# Patient Record
Sex: Female | Born: 1955
Health system: Southern US, Community
[De-identification: ages and names within clinical notes are randomized; demographics above are authoritative.]

## PROBLEM LIST (undated history)

## (undated) DIAGNOSIS — I10 Essential (primary) hypertension: Secondary | ICD-10-CM

## (undated) DIAGNOSIS — E119 Type 2 diabetes mellitus without complications: Secondary | ICD-10-CM

## (undated) DIAGNOSIS — E785 Hyperlipidemia, unspecified: Secondary | ICD-10-CM

## (undated) DIAGNOSIS — F419 Anxiety disorder, unspecified: Secondary | ICD-10-CM

## (undated) DIAGNOSIS — J449 Chronic obstructive pulmonary disease, unspecified: Secondary | ICD-10-CM

## (undated) DIAGNOSIS — F32A Depression, unspecified: Secondary | ICD-10-CM

## (undated) DIAGNOSIS — F329 Major depressive disorder, single episode, unspecified: Secondary | ICD-10-CM

## (undated) DIAGNOSIS — J45909 Unspecified asthma, uncomplicated: Secondary | ICD-10-CM

## (undated) HISTORY — DX: Unspecified asthma, uncomplicated: J45.909

## (undated) HISTORY — DX: Major depressive disorder, single episode, unspecified: F32.9

## (undated) HISTORY — DX: Type 2 diabetes mellitus without complications: E11.9

## (undated) HISTORY — DX: Hyperlipidemia, unspecified: E78.5

## (undated) HISTORY — DX: Anxiety disorder, unspecified: F41.9

## (undated) HISTORY — PX: INNER EAR SURGERY: SHX679

## (undated) HISTORY — DX: Depression, unspecified: F32.A

## (undated) HISTORY — PX: DILATION AND CURETTAGE OF UTERUS: SHX78

---

## 2000-08-05 ENCOUNTER — Emergency Department (HOSPITAL_COMMUNITY): Admission: EM | Admit: 2000-08-05 | Discharge: 2000-08-05 | Payer: Self-pay | Admitting: Emergency Medicine

## 2000-08-06 ENCOUNTER — Emergency Department (HOSPITAL_COMMUNITY): Admission: EM | Admit: 2000-08-06 | Discharge: 2000-08-06 | Payer: Self-pay | Admitting: Emergency Medicine

## 2000-08-07 ENCOUNTER — Emergency Department (HOSPITAL_COMMUNITY): Admission: EM | Admit: 2000-08-07 | Discharge: 2000-08-07 | Payer: Self-pay | Admitting: Emergency Medicine

## 2000-08-08 ENCOUNTER — Emergency Department (HOSPITAL_COMMUNITY): Admission: EM | Admit: 2000-08-08 | Discharge: 2000-08-08 | Payer: Self-pay | Admitting: Emergency Medicine

## 2000-09-02 ENCOUNTER — Emergency Department (HOSPITAL_COMMUNITY): Admission: EM | Admit: 2000-09-02 | Discharge: 2000-09-02 | Payer: Self-pay | Admitting: Emergency Medicine

## 2000-09-03 ENCOUNTER — Emergency Department (HOSPITAL_COMMUNITY): Admission: EM | Admit: 2000-09-03 | Discharge: 2000-09-03 | Payer: Self-pay | Admitting: *Deleted

## 2000-12-21 ENCOUNTER — Emergency Department (HOSPITAL_COMMUNITY): Admission: EM | Admit: 2000-12-21 | Discharge: 2000-12-21 | Payer: Self-pay | Admitting: Emergency Medicine

## 2006-03-24 ENCOUNTER — Ambulatory Visit: Payer: Self-pay | Admitting: Family Medicine

## 2006-03-25 ENCOUNTER — Ambulatory Visit: Payer: Self-pay | Admitting: *Deleted

## 2006-11-30 ENCOUNTER — Encounter (INDEPENDENT_AMBULATORY_CARE_PROVIDER_SITE_OTHER): Payer: Self-pay | Admitting: *Deleted

## 2011-04-29 DIAGNOSIS — F609 Personality disorder, unspecified: Secondary | ICD-10-CM | POA: Diagnosis not present

## 2011-04-29 DIAGNOSIS — F322 Major depressive disorder, single episode, severe without psychotic features: Secondary | ICD-10-CM | POA: Diagnosis not present

## 2011-07-06 DIAGNOSIS — F3189 Other bipolar disorder: Secondary | ICD-10-CM | POA: Diagnosis not present

## 2011-12-20 DIAGNOSIS — F609 Personality disorder, unspecified: Secondary | ICD-10-CM | POA: Diagnosis not present

## 2011-12-20 DIAGNOSIS — F322 Major depressive disorder, single episode, severe without psychotic features: Secondary | ICD-10-CM | POA: Diagnosis not present

## 2012-02-29 DIAGNOSIS — F609 Personality disorder, unspecified: Secondary | ICD-10-CM | POA: Diagnosis not present

## 2012-02-29 DIAGNOSIS — F322 Major depressive disorder, single episode, severe without psychotic features: Secondary | ICD-10-CM | POA: Diagnosis not present

## 2012-03-21 DIAGNOSIS — F322 Major depressive disorder, single episode, severe without psychotic features: Secondary | ICD-10-CM | POA: Diagnosis not present

## 2012-03-30 ENCOUNTER — Ambulatory Visit (INDEPENDENT_AMBULATORY_CARE_PROVIDER_SITE_OTHER): Payer: Medicare Other | Admitting: Emergency Medicine

## 2012-03-30 VITALS — BP 220/103 | HR 98 | Temp 97.6°F | Resp 16 | Ht 64.0 in

## 2012-03-30 DIAGNOSIS — I10 Essential (primary) hypertension: Secondary | ICD-10-CM

## 2012-03-30 MED ORDER — METOPROLOL SUCCINATE ER 50 MG PO TB24: 50.0000 mg | ORAL_TABLET | Freq: Every day | ORAL | Status: DC

## 2012-03-30 MED ORDER — HYDROCHLOROTHIAZIDE 25 MG PO TABS: 25.0000 mg | ORAL_TABLET | Freq: Every day | ORAL | Status: DC

## 2012-03-30 NOTE — Patient Instructions (Signed)

## 2012-03-30 NOTE — Progress Notes (Signed)
[facility] [address] [place] [address] [phone]  Date:  03/30/2012   Name:  Gwendolyn Pham   DOB:  08/15/1955   MRN:  [id number]  PCP:  No primary provider on file.    Chief Complaint: Hypertension   History of Present Illness:  Gwendolyn Pham is a 57 y.o. very pleasant female patient who presents with the following:  History of elevated pressure over the past three months.  Has daily headaches.  No chest pain or shortness of breath.  Has blood pressure checked when she picks up her medications and has been told for months to come and be treated.  Decided today to come in and have that done.  Smokes 1 PPD and is trying to quit.  There is no problem list on file for this patient.   Past Medical History  Diagnosis Date  . Anxiety   . Asthma   . Depression     Past Surgical History  Procedure Date  . Inner ear surgery     History  Substance Use Topics  . Smoking status: Current Every Day Smoker  . Smokeless tobacco: Not on file     Comment: 1/2 pack per day  . Alcohol Use: No    Family History  Problem Relation Age of Onset  . Hypertension Mother   . Diabetes Mother   . COPD Father   . Heart disease Father     Allergies  Allergen Reactions  . Penicillins Rash    Medication list has been reviewed and updated.  Current Outpatient Prescriptions on File Prior to Visit  Medication Sig Dispense Refill  . venlafaxine (EFFEXOR) 75 MG tablet Take 75 mg by mouth 2 (two) times daily.        Review of Systems:  As per HPI, otherwise negative.    Physical Examination: Filed Vitals:   03/30/12 1427  BP: 220/103  Pulse: 98  Temp: 97.6 F (36.4 C)  Resp: 16   Filed Vitals:   03/30/12 1427  Height: 5\' 4"  (1.626 m)   There is no weight on file to calculate BMI. Ideal Body Weight: Weight in (lb) to have BMI = 25: 145.3   GEN: WDWN, NAD, Non-toxic, A & O x 3 HEENT: Atraumatic, Normocephalic. Neck supple. No masses, No LAD.   Fundi benign Ears and Nose: No external deformity. CV: RRR, No M/G/R. No JVD. No thrill. No extra heart sounds. PULM: CTA B, no wheezes, crackles, rhonchi. No retractions. No resp. distress. No accessory muscle use. ABD: S, NT, ND, +BS. No rebound. No HSM. EXTR: No c/c/e NEURO Normal gait.  PSYCH: Normally interactive. Conversant. Not depressed or anxious appearing.  Calm demeanor.    Assessment and Plan: Hypertension toprol hctz Follow up fasting in two weeks  Carmelina Dane, MD

## 2012-04-03 ENCOUNTER — Observation Stay (HOSPITAL_COMMUNITY)
Admission: EM | Admit: 2012-04-03 | Discharge: 2012-04-04 | Disposition: A | Discharge status: Home / Self Care | Payer: Medicare Other | Attending: Internal Medicine | Admitting: Internal Medicine

## 2012-04-03 ENCOUNTER — Encounter (HOSPITAL_COMMUNITY): Payer: Self-pay | Admitting: *Deleted

## 2012-04-03 ENCOUNTER — Emergency Department (HOSPITAL_COMMUNITY): Payer: Medicare Other

## 2012-04-03 DIAGNOSIS — R51 Headache: Secondary | ICD-10-CM | POA: Insufficient documentation

## 2012-04-03 DIAGNOSIS — F411 Generalized anxiety disorder: Secondary | ICD-10-CM | POA: Diagnosis not present

## 2012-04-03 DIAGNOSIS — F329 Major depressive disorder, single episode, unspecified: Secondary | ICD-10-CM | POA: Diagnosis present

## 2012-04-03 DIAGNOSIS — F3289 Other specified depressive episodes: Secondary | ICD-10-CM | POA: Insufficient documentation

## 2012-04-03 DIAGNOSIS — F341 Dysthymic disorder: Secondary | ICD-10-CM | POA: Diagnosis not present

## 2012-04-03 DIAGNOSIS — I161 Hypertensive emergency: Secondary | ICD-10-CM | POA: Diagnosis present

## 2012-04-03 DIAGNOSIS — I10 Essential (primary) hypertension: Secondary | ICD-10-CM | POA: Diagnosis not present

## 2012-04-03 DIAGNOSIS — F419 Anxiety disorder, unspecified: Secondary | ICD-10-CM

## 2012-04-03 HISTORY — DX: Essential (primary) hypertension: I10

## 2012-04-03 LAB — CBC WITH DIFFERENTIAL/PLATELET
Basophils Absolute: 0 10*3/uL (ref 0.0–0.1)
Basophils Relative: 0 % (ref 0–1)
Eosinophils Absolute: 0.1 10*3/uL (ref 0.0–0.7)
Eosinophils Relative: 2 % (ref 0–5)
HCT: 46.1 % — ABNORMAL HIGH (ref 36.0–46.0)
Hemoglobin: 16.3 g/dL — ABNORMAL HIGH (ref 12.0–15.0)
Lymphocytes Relative: 41 % (ref 12–46)
Lymphs Abs: 3.4 10*3/uL (ref 0.7–4.0)
MCH: 32.1 pg (ref 26.0–34.0)
MCHC: 35.4 g/dL (ref 30.0–36.0)
MCV: 90.9 fL (ref 78.0–100.0)
Monocytes Absolute: 0.4 10*3/uL (ref 0.1–1.0)
Monocytes Relative: 5 % (ref 3–12)
Neutro Abs: 4.3 10*3/uL (ref 1.7–7.7)
Neutrophils Relative %: 52 % (ref 43–77)
Platelets: 214 10*3/uL (ref 150–400)
RBC: 5.07 MIL/uL (ref 3.87–5.11)
RDW: 13.7 % (ref 11.5–15.5)
WBC: 8.2 10*3/uL (ref 4.0–10.5)

## 2012-04-03 LAB — COMPREHENSIVE METABOLIC PANEL
ALT: 13 U/L (ref 0–35)
AST: 15 U/L (ref 0–37)
Albumin: 3.8 g/dL (ref 3.5–5.2)
Alkaline Phosphatase: 107 U/L (ref 39–117)
BUN: 19 mg/dL (ref 6–23)
CO2: 25 mEq/L (ref 19–32)
Calcium: 10.2 mg/dL (ref 8.4–10.5)
Chloride: 93 mEq/L — ABNORMAL LOW (ref 96–112)
Creatinine, Ser: 0.71 mg/dL (ref 0.50–1.10)
GFR calc Af Amer: >90 mL/min (ref >90)
GFR calc non Af Amer: >90 mL/min (ref >90)
Glucose, Bld: 193 mg/dL — ABNORMAL HIGH (ref 70–99)
Potassium: 3.3 mEq/L — ABNORMAL LOW (ref 3.5–5.1)
Sodium: 132 mEq/L — ABNORMAL LOW (ref 135–145)
Total Bilirubin: 0.2 mg/dL — ABNORMAL LOW (ref 0.3–1.2)
Total Protein: 8 g/dL (ref 6.0–8.3)

## 2012-04-03 MED ORDER — CLONIDINE HCL 0.2 MG PO TABS
0.2000 mg | ORAL_TABLET | Freq: Once | ORAL | Status: DC
  Filled 2012-04-03: qty 1

## 2012-04-03 MED ORDER — CLONIDINE HCL 0.2 MG PO TABS
0.2000 mg | ORAL_TABLET | Freq: Once | ORAL | Status: AC
  Administered 2012-04-04: 0.2 mg via ORAL

## 2012-04-03 NOTE — ED Notes (Signed)
Patient transported to CT 

## 2012-04-03 NOTE — ED Notes (Signed)
She was just diagnosed with high bp on Thursday and was placed on bp med then.  Tonight she has had a severe headache and her bp is elevated

## 2012-04-03 NOTE — ED Provider Notes (Addendum)
History     CSN: [id number]  Arrival date & time 04/03/12  2216   First MD Initiated Contact with Patient 04/03/12 2313      Chief Complaint  Patient presents with  . Hypertension    (Consider location/radiation/quality/duration/timing/severity/associated sxs/prior treatment) Patient is a 57 y.o. female presenting with hypertension. The history is provided by the patient.  Hypertension This is a new problem. The current episode started more than 2 days ago. The problem occurs constantly. The problem has not changed since onset.Associated symptoms include headaches. Nothing aggravates the symptoms. Nothing relieves the symptoms. She has tried nothing for the symptoms.    Past Medical History  Diagnosis Date  . Anxiety   . Asthma   . Depression   . Hypertension     Past Surgical History  Procedure Date  . Inner ear surgery     Family History  Problem Relation Age of Onset  . Hypertension Mother   . Diabetes Mother   . COPD Father   . Heart disease Father     History  Substance Use Topics  . Smoking status: Current Every Day Smoker  . Smokeless tobacco: Not on file     Comment: 1/2 pack per day  . Alcohol Use: No    OB History    Grav Para Term Preterm Abortions TAB SAB Ect Mult Living                  Review of Systems  Neurological: Positive for headaches.  All other systems reviewed and are negative.    Allergies  Penicillins  Home Medications   Current Outpatient Rx  Name  Route  Sig  Dispense  Refill  . HYDROCHLOROTHIAZIDE 25 MG PO TABS   Oral   Take 1 tablet (25 mg total) by mouth daily.   30 tablet   3   . METOPROLOL SUCCINATE ER 50 MG PO TB24   Oral   Take 1 tablet (50 mg total) by mouth daily. Take with or immediately following a meal.   30 tablet   3   . VENLAFAXINE HCL 75 MG PO TABS   Oral   Take 75 mg by mouth daily.            BP 224/110  Pulse 105  Temp 98.4 F (36.9 C) (Oral)  Resp 16  SpO2 96%  Physical Exam    Constitutional: She is oriented to person, place, and time. She appears well-developed and well-nourished.  HENT:  Head: Normocephalic and atraumatic.  Eyes: Conjunctivae normal and EOM are normal. Pupils are equal, round, and reactive to light.  Neck: Normal range of motion.  Cardiovascular: Normal rate, regular rhythm and normal heart sounds.   Pulmonary/Chest: Effort normal and breath sounds normal.  Abdominal: Soft. Bowel sounds are normal.  Musculoskeletal: Normal range of motion.  Neurological: She is alert and oriented to person, place, and time.  Skin: Skin is warm and dry.  Psychiatric: She has a normal mood and affect. Her behavior is normal.    ED Course  Procedures (including critical care time)  Labs Reviewed  CBC WITH DIFFERENTIAL - Abnormal; Notable for the following:    Hemoglobin 16.3 (*)     HCT 46.1 (*)     All other components within normal limits  COMPREHENSIVE METABOLIC PANEL - Abnormal; Notable for the following:    Sodium 132 (*)     Potassium 3.3 (*)     Chloride 93 (*)  Glucose, Bld 193 (*)     Total Bilirubin 0.2 (*)     All other components within normal limits  TROPONIN I   No results found.   No diagnosis found.    Date: 04/03/2012  Rate: 83  Rhythm: normal sinus rhythm  QRS Axis: normal  Intervals: normal  ST/T Wave abnormalities: normal  Conduction Disutrbances: none  Narrative Interpretation: unremarkable    MDM  + headache,  Htn.  Failed outpt meds.  Pt states has been compliantNot worst ha of life.  Will ct,  Antihypertensive,  Reassess.   Will admit for hypertensive urgency     Emmarose Klinke Lytle Michaels, MD 04/03/12 2345  Ugonna Keirsey Lytle Michaels, MD 04/04/12 0454

## 2012-04-04 DIAGNOSIS — I10 Essential (primary) hypertension: Secondary | ICD-10-CM | POA: Diagnosis not present

## 2012-04-04 DIAGNOSIS — I161 Hypertensive emergency: Secondary | ICD-10-CM | POA: Diagnosis present

## 2012-04-04 DIAGNOSIS — F329 Major depressive disorder, single episode, unspecified: Secondary | ICD-10-CM

## 2012-04-04 DIAGNOSIS — F411 Generalized anxiety disorder: Secondary | ICD-10-CM | POA: Diagnosis not present

## 2012-04-04 DIAGNOSIS — F419 Anxiety disorder, unspecified: Secondary | ICD-10-CM

## 2012-04-04 LAB — TSH: TSH: 3.139 u[IU]/mL (ref 0.350–4.500)

## 2012-04-04 MED ORDER — ASPIRIN EC 81 MG PO TBEC: 81.0000 mg | DELAYED_RELEASE_TABLET | Freq: Every day | ORAL | Status: DC

## 2012-04-04 MED ORDER — ESCITALOPRAM OXALATE 10 MG PO TABS
10.0000 mg | ORAL_TABLET | Freq: Every day | ORAL | Status: DC
Start: 2012-04-04 — End: 2012-04-04
  Administered 2012-04-04: 10 mg via ORAL
  Filled 2012-04-04: qty 1

## 2012-04-04 MED ORDER — POTASSIUM CHLORIDE CRYS ER 20 MEQ PO TBCR
40.0000 meq | EXTENDED_RELEASE_TABLET | Freq: Once | ORAL | Status: AC
  Administered 2012-04-04: 40 meq via ORAL
  Filled 2012-04-04: qty 2

## 2012-04-04 MED ORDER — HYDRALAZINE HCL 20 MG/ML IJ SOLN
20.0000 mg | Freq: Once | INTRAMUSCULAR | Status: AC
  Administered 2012-04-04: 20 mg via INTRAVENOUS
  Filled 2012-04-04: qty 1

## 2012-04-04 MED ORDER — METOPROLOL SUCCINATE ER 50 MG PO TB24
50.0000 mg | ORAL_TABLET | Freq: Every day | ORAL | Status: DC
  Administered 2012-04-04: 50 mg via ORAL
  Filled 2012-04-04: qty 1

## 2012-04-04 MED ORDER — LABETALOL HCL 5 MG/ML IV SOLN
10.0000 mg | INTRAVENOUS | Status: DC | PRN
  Filled 2012-04-04: qty 4

## 2012-04-04 MED ORDER — LISINOPRIL 10 MG PO TABS
10.0000 mg | ORAL_TABLET | Freq: Every day | ORAL | Status: DC
  Administered 2012-04-04: 10 mg via ORAL
  Filled 2012-04-04: qty 1

## 2012-04-04 MED ORDER — ONDANSETRON HCL 4 MG/2ML IJ SOLN: 4.0000 mg | Freq: Four times a day (QID) | INTRAMUSCULAR | Status: DC | PRN

## 2012-04-04 MED ORDER — ONDANSETRON HCL 4 MG PO TABS: 4.0000 mg | ORAL_TABLET | Freq: Four times a day (QID) | ORAL | Status: DC | PRN

## 2012-04-04 MED ORDER — LISINOPRIL 10 MG PO TABS: 10.0000 mg | ORAL_TABLET | Freq: Every day | ORAL | Status: DC

## 2012-04-04 MED ORDER — SODIUM CHLORIDE 0.9 % IJ SOLN
3.0000 mL | Freq: Two times a day (BID) | INTRAMUSCULAR | Status: DC
  Administered 2012-04-04: 3 mL via INTRAVENOUS

## 2012-04-04 NOTE — Discharge Summary (Signed)
Physician Discharge Summary  Patient ID: Gwendolyn Pham MRN: [id number] DOB/AGE: [birth year] 57 y.o.  Admit date: 04/03/2012 Discharge date: 04/04/2012  Primary Care Physician:  Default, Provider, MD   Discharge Diagnoses:    Principal Problem:  *Hypertensive emergency Active Problems:  Anxiety  Depression      Medication List     As of 04/04/2012  2:18 PM    TAKE these medications         aspirin EC 81 MG tablet   Take 1 tablet (81 mg total) by mouth daily.      hydrochlorothiazide 25 MG tablet   Commonly known as: HYDRODIURIL   Take 1 tablet (25 mg total) by mouth daily.      lisinopril 10 MG tablet   Commonly known as: PRINIVIL,ZESTRIL   Take 1 tablet (10 mg total) by mouth daily.      metoprolol succinate 50 MG 24 hr tablet   Commonly known as: TOPROL-XL   Take 1 tablet (50 mg total) by mouth daily. Take with or immediately following a meal.      venlafaxine 75 MG tablet   Commonly known as: EFFEXOR   Take 75 mg by mouth daily.         Disposition and Follow-up:  Will be discharged home today in stable and improved condition. Has been advised to follow up with her PCP in 1 week for a BP check. At that time she will also need a BMET to follow on her renal function and electrolytes on newly started ACE-I.  Consults:  None   Significant Diagnostic Studies:  Ct Head Wo Contrast  04/04/2012  *RADIOLOGY REPORT*  Clinical Data: Severe headache and high blood pressure.  CT HEAD WITHOUT CONTRAST  Technique:  Contiguous axial images were obtained from the base of the skull through the vertex without contrast.  Comparison: None.  Findings: There are patchy areas of low density in the white matter.  This is most noticeable in the anterior limbs of the internal capsules and within the left corona radiata.  No evidence for acute hemorrhage, mass lesion, midline shift or hydrocephalus. No evidence for a large infarct.  No acute bony abnormality.  IMPRESSION: No evidence for  acute hemorrhage.  Scattered areas of white matter disease are nonspecific.  Findings could represent chronic small vessel ischemic changes.   Original Report Authenticated By: Richarda Overlie, M.D.     Brief H and P: For complete details please refer to admission H and P, but in brief patient is a 57 y.o. woman with hx of HTN, asthma, anxiety, depression, presents to the ER complaining of HA and stated that her BP has been elevated. She was started on Lopressor. She denied any neurological problem, chest pain or shortness of breath. Evaluation in the ER included an EKG with showed no acute ST_T changes, a negative head CT, normal renal function tests, normal LFTs and normal WBC. She was given IV hadralazine with improvement of her BP and hospitalist was asked to admit her for HTN urgency.      Hospital Course:  Principal Problem:  *Hypertensive emergency Active Problems:  Anxiety  Depression   HTN Urgency -We have added lisinopril to her HCTZ and metoprolol. -Her SBP has decreased to 130-150. -No longer has a headache. -I will DC her home today. -She has been instructed to follow up with her PCP in 1 week for a BP check. -She will also need a BMET since she was just started on lisinopril  to follow her Cr and K.  Rest of chronic medical issues have been stable this hospitalization.   Time spent on Discharge: Greater than 30 minutes.  SignedChaya Jan [facility] Pager: [phone] 04/04/2012, 2:18 PM

## 2012-04-04 NOTE — H&P (Signed)
[facility] History and Physical  [name] JXB:[id number] DOB: May 08, 1955    PCP:  Dr Dareen Piano.  Chief Complaint: HA and elevated BP.  HPI: Gwendolyn Pham is an 57 y.o. female with hx of HTN, asthma, anxiety, depression, presents to the ER complaining of HA and stated that her BP has been elevated.  She was started on Lopressor.  She denied any neurological problem, chest pain or shortness of breath.  Evaluation in the ER included an EKG with showed no acute ST_T changes, a negative head CT, normal renal function tests, normal LFTs and normal WBC.  She was given IV hadralazine with improvement of her BP and hospitalist was asked to admit her for HTN urgency.   Rewiew of Systems:  Constitutional: Negative for malaise, fever and chills. No significant weight loss or weight gain Eyes: Negative for eye pain, redness and discharge, diplopia, visual changes, or flashes of light. ENMT: Negative for ear pain, hoarseness, nasal congestion, sinus pressure and sore throat. No headaches; tinnitus, drooling, or problem swallowing. Cardiovascular: Negative for chest pain, palpitations, diaphoresis, dyspnea and peripheral edema. ; No orthopnea, PND Respiratory: Negative for cough, hemoptysis, wheezing and stridor. No pleuritic chestpain. Gastrointestinal: Negative for nausea, vomiting, diarrhea, constipation, abdominal pain, melena, blood in stool, hematemesis, jaundice and rectal bleeding.    Genitourinary: Negative for frequency, dysuria, incontinence,flank pain and hematuria; Musculoskeletal: Negative for back pain and neck pain. Negative for swelling and trauma.;  Skin: . Negative for pruritus, rash, abrasions, bruising and skin lesion.; ulcerations Neuro: Negative for lightheadedness and neck stiffness. Negative for weakness, altered level of consciousness , altered mental status, extremity weakness, burning feet, involuntary movement, seizure and syncope.  Psych: negative for anxiety,  depression, insomnia, tearfulness, panic attacks, hallucinations, paranoia, suicidal or homicidal ideation    Past Medical History  Diagnosis Date  . Anxiety   . Asthma   . Depression   . Hypertension     Past Surgical History  Procedure Date  . Inner ear surgery     Medications:  HOME MEDS: Prior to Admission medications   Medication Sig Start Date End Date Taking? Authorizing Provider  hydrochlorothiazide (HYDRODIURIL) 25 MG tablet Take 1 tablet (25 mg total) by mouth daily. 03/30/12  Yes Phillips Odor, MD  metoprolol succinate (TOPROL-XL) 50 MG 24 hr tablet Take 1 tablet (50 mg total) by mouth daily. Take with or immediately following a meal. 03/30/12  Yes Phillips Odor, MD  venlafaxine (EFFEXOR) 75 MG tablet Take 75 mg by mouth daily.    Yes Historical Provider, MD     Allergies:  Allergies  Allergen Reactions  . Penicillins Rash    Social History:   reports that she has been smoking.  She does not have any smokeless tobacco history on file. She reports that she does not drink alcohol or use illicit drugs.  Family History: Family History  Problem Relation Age of Onset  . Hypertension Mother   . Diabetes Mother   . COPD Father   . Heart disease Father      Physical Exam: Filed Vitals:   04/04/12 0200 04/04/12 0300 04/04/12 0330 04/04/12 0405  BP: 184/93 159/82 139/76 169/93  Pulse: 79 71 70 86  Temp:    97.3 F (36.3 C)  TempSrc:    Oral  Resp:  14 14 16   Height:    5\' 4"  (1.626 m)  Weight:    74.072 kg (163 lb 4.8 oz)  SpO2: 97% 98% 96% 96%   Blood  pressure 169/93, pulse 86, temperature 97.3 F (36.3 C), temperature source Oral, resp. rate 16, height 5\' 4"  (1.626 m), weight 74.072 kg (163 lb 4.8 oz), SpO2 96.00%.  GEN:  Pleasant  patient lying in the stretcher in no acute distress; cooperative with exam. PSYCH:  alert and oriented x4; does not appear anxious or depressed; affect is appropriate. HEENT: Mucous membranes pink and anicteric; PERRLA;  EOM intact; no cervical lymphadenopathy nor thyromegaly or carotid bruit; no JVD; There were no stridor. Neck is very supple. Breasts:: Not examined CHEST WALL: No tenderness CHEST: Normal respiration, clear to auscultation bilaterally.  HEART: Regular rate and rhythm.  There are no murmur, rub, or gallops.   BACK: No kyphosis or scoliosis; no CVA tenderness ABDOMEN: soft and non-tender; no masses, no organomegaly, normal abdominal bowel sounds; no pannus; no intertriginous candida. There is no rebound and no distention. Rectal Exam: Not done EXTREMITIES: No bone or joint deformity; age-appropriate arthropathy of the hands and knees; no edema; no ulcerations.  There is no calf tenderness. Genitalia: not examined PULSES: 2+ and symmetric SKIN: Normal hydration no rash or ulceration CNS: Cranial nerves 2-12 grossly intact no focal lateralizing neurologic deficit.  Speech is fluent; uvula elevated with phonation, facial symmetry and tongue midline. DTR are normal bilaterally, cerebella exam is intact, barbinski is negative and strengths are equaled bilaterally.  No sensory loss.   Labs on Admission:  Basic Metabolic Panel:  Lab 04/03/12 7829  NA 132*  K 3.3*  CL 93*  CO2 25  GLUCOSE 193*  BUN 19  CREATININE 0.71  CALCIUM 10.2  MG --  PHOS --   Liver Function Tests:  Lab 04/03/12 2229  AST 15  ALT 13  ALKPHOS 107  BILITOT 0.2*  PROT 8.0  ALBUMIN 3.8   No results found for this basename: LIPASE:5,AMYLASE:5 in the last 168 hours No results found for this basename: AMMONIA:5 in the last 168 hours CBC:  Lab 04/03/12 2229  WBC 8.2  NEUTROABS 4.3  HGB 16.3*  HCT 46.1*  MCV 90.9  PLT 214   Cardiac Enzymes:  Lab 04/03/12 2229  CKTOTAL --  CKMB --  CKMBINDEX --  TROPONINI <0.30    CBG: No results found for this basename: GLUCAP:5 in the last 168 hours   Radiological Exams on Admission: Ct Head Wo Contrast  04/04/2012  *RADIOLOGY REPORT*  Clinical Data: Severe  headache and high blood pressure.  CT HEAD WITHOUT CONTRAST  Technique:  Contiguous axial images were obtained from the base of the skull through the vertex without contrast.  Comparison: None.  Findings: There are patchy areas of low density in the white matter.  This is most noticeable in the anterior limbs of the internal capsules and within the left corona radiata.  No evidence for acute hemorrhage, mass lesion, midline shift or hydrocephalus. No evidence for a large infarct.  No acute bony abnormality.  IMPRESSION: No evidence for acute hemorrhage.  Scattered areas of white matter disease are nonspecific.  Findings could represent chronic small vessel ischemic changes.   Original Report Authenticated By: Richarda Overlie, M.D.     EKG: Independently reviewed.    Assessment/Plan Present on Admission:  . Hypertensive emergency . Depression HA  PLAN:  I will admit her to telemetry to better control her BP.  Will start with Atenelol and Lisinopril.  I will use IV labetalol for PRN meds to control her severe HTN.  She has been taking Effexor the short acting medicine for anxiety,  this can cause HTN, so I will change her to Lexapro.  She is stable, full code, and will be admit to [facility] for better BP control.   Other plans as per orders.  Code Status: FULL Unk Lightning, MD. [facility] Pager [phone] 7pm to 7am.  04/04/2012, 6:09 AM

## 2012-04-04 NOTE — ED Notes (Signed)
Attempted to call report, nurse unavailable.

## 2012-04-04 NOTE — Progress Notes (Signed)
IV and tele monitor d/c at this time; pt to d/c home; awaiting d/c paperwork; pt ok to drive herself home per Dr. Ardyth Harps; will cont. To monitor.

## 2012-04-13 ENCOUNTER — Ambulatory Visit (INDEPENDENT_AMBULATORY_CARE_PROVIDER_SITE_OTHER): Payer: Medicare Other | Admitting: Emergency Medicine

## 2012-04-13 VITALS — BP 130/85 | HR 88 | Temp 98.4°F | Resp 18 | Wt 163.0 lb

## 2012-04-13 DIAGNOSIS — R7309 Other abnormal glucose: Secondary | ICD-10-CM

## 2012-04-13 DIAGNOSIS — I10 Essential (primary) hypertension: Secondary | ICD-10-CM | POA: Diagnosis not present

## 2012-04-13 LAB — COMPREHENSIVE METABOLIC PANEL
ALT: 11 U/L (ref 0–35)
AST: 10 U/L (ref 0–37)
Albumin: 4.3 g/dL (ref 3.5–5.2)
Alkaline Phosphatase: 97 U/L (ref 39–117)
BUN: 18 mg/dL (ref 6–23)
Chloride: 101 mEq/L (ref 96–112)
Potassium: 4.5 mEq/L (ref 3.5–5.3)

## 2012-04-13 LAB — LIPID PANEL
HDL: 40 mg/dL (ref >39)
LDL Cholesterol: 197 mg/dL — ABNORMAL HIGH (ref 0–99)
Total CHOL/HDL Ratio: 6.7 Ratio

## 2012-04-13 NOTE — Progress Notes (Signed)
[facility] [address] [place] [address] [phone]  Date:  04/13/2012   Name:  Gwendolyn Pham   DOB:  02/01/1956   MRN:  [id number]  PCP:  Default, Provider, MD    Chief Complaint: Hypertension   History of Present Illness:  Gwendolyn Pham is a 57 y.o. very pleasant female patient who presents with the following:  Seen for hypertension and started on meds.  Two days later admitted overnight for hypertensive urgency.  Medication changed and she is here for follow up.  Has no further headache and her blood pressure is controlled.   Fasting for labs.  Non smoker.  Denies other complaints or concerns.  Patient Active Problem List  Diagnosis  . Hypertensive emergency  . Anxiety  . Depression    Past Medical History  Diagnosis Date  . Anxiety   . Asthma   . Depression   . Hypertension     Past Surgical History  Procedure Date  . Inner ear surgery     History  Substance Use Topics  . Smoking status: Current Every Day Smoker  . Smokeless tobacco: Not on file     Comment: 1/2 pack per day  . Alcohol Use: No    Family History  Problem Relation Age of Onset  . Hypertension Mother   . Diabetes Mother   . COPD Father   . Heart disease Father     Allergies  Allergen Reactions  . Penicillins Rash    Medication list has been reviewed and updated.  Current Outpatient Prescriptions on File Prior to Visit  Medication Sig Dispense Refill  . aspirin EC 81 MG tablet Take 1 tablet (81 mg total) by mouth daily.      . hydrochlorothiazide (HYDRODIURIL) 25 MG tablet Take 1 tablet (25 mg total) by mouth daily.  30 tablet  3  . lisinopril (PRINIVIL,ZESTRIL) 10 MG tablet Take 1 tablet (10 mg total) by mouth daily.  30 tablet  1  . metoprolol succinate (TOPROL-XL) 50 MG 24 hr tablet Take 1 tablet (50 mg total) by mouth daily. Take with or immediately following a meal.  30 tablet  3  . venlafaxine (EFFEXOR) 75 MG tablet Take 75 mg by mouth daily.          Review of Systems:  As per HPI, otherwise negative.    Physical Examination: Filed Vitals:   04/13/12 1144  BP: 130/85  Pulse: 88  Temp: 98.4 F (36.9 C)  Resp: 18   Filed Vitals:   04/13/12 1144  Weight: 163 lb (73.936 kg)   There is no height on file to calculate BMI. Ideal Body Weight:    GEN: WDWN, NAD, Non-toxic, A & O x 3 HEENT: Atraumatic, Normocephalic. Neck supple. No masses, No LAD. Ears and Nose: No external deformity. CV: RRR, No M/G/R. No JVD. No thrill. No extra heart sounds. PULM: CTA B, no wheezes, crackles, rhonchi. No retractions. No resp. distress. No accessory muscle use. ABD: S, NT, ND, +BS. No rebound. No HSM. EXTR: No c/c/e NEURO Normal gait.  PSYCH: Normally interactive. Conversant. Not depressed or anxious appearing.  Calm demeanor.    Assessment and Plan: Hypertension Hypercholesterolemia Follow up in  3 months Lipitor refills  Carmelina Dane, MD

## 2012-04-14 ENCOUNTER — Ambulatory Visit (INDEPENDENT_AMBULATORY_CARE_PROVIDER_SITE_OTHER): Payer: Medicare Other | Admitting: Family Medicine

## 2012-04-14 ENCOUNTER — Encounter: Payer: Self-pay | Admitting: Family Medicine

## 2012-04-14 VITALS — BP 154/80 | HR 77 | Temp 97.6°F | Resp 16 | Ht 65.0 in | Wt 164.2 lb

## 2012-04-14 DIAGNOSIS — E7800 Pure hypercholesterolemia, unspecified: Secondary | ICD-10-CM | POA: Diagnosis not present

## 2012-04-14 DIAGNOSIS — F17200 Nicotine dependence, unspecified, uncomplicated: Secondary | ICD-10-CM

## 2012-04-14 DIAGNOSIS — I10 Essential (primary) hypertension: Secondary | ICD-10-CM | POA: Diagnosis not present

## 2012-04-14 MED ORDER — LISINOPRIL 10 MG PO TABS: 10.0000 mg | ORAL_TABLET | Freq: Every day | ORAL | Status: DC

## 2012-04-14 NOTE — Patient Instructions (Addendum)
Smoking Cessation, Tips for Success YOU CAN QUIT SMOKING If you are ready to quit smoking, congratulations! You have chosen to help yourself be healthier. Cigarettes bring nicotine, tar, carbon monoxide, and other irritants into your body. Your lungs, heart, and blood vessels will be able to work better without these poisons. There are many different ways to quit smoking. Nicotine gum, nicotine patches, a nicotine inhaler, or nicotine nasal spray can help with physical craving. Hypnosis, support groups, and medicines help break the habit of smoking. Here are some tips to help you quit for good.  Throw away all cigarettes.  Clean and remove all ashtrays from your home, work, and car.  On a card, write down your reasons for quitting. Carry the card with you and read it when you get the urge to smoke.  Cleanse your body of nicotine. Drink enough water and fluids to keep your urine clear or pale yellow. Do this after quitting to flush the nicotine from your body.  Learn to predict your moods. Do not let a bad situation be your excuse to have a cigarette. Some situations in your life might tempt you into wanting a cigarette.  Never have "just one" cigarette. It leads to wanting another and another. Remind yourself of your decision to quit.  Change habits associated with smoking. If you smoked while driving or when feeling stressed, try other activities to replace smoking. Stand up when drinking your coffee. Brush your teeth after eating. Sit in a different chair when you read the paper. Avoid alcohol while trying to quit, and try to drink fewer caffeinated beverages. Alcohol and caffeine may urge you to smoke.  Avoid foods and drinks that can trigger a desire to smoke, such as sugary or spicy foods and alcohol.  Ask people who smoke not to smoke around you.  Have something planned to do right after eating or having a cup of coffee. Take a walk or exercise to perk you up. This will help to keep you  from overeating.  Try a relaxation exercise to calm you down and decrease your stress. Remember, you may be tense and nervous for the first 2 weeks after you quit, but this will pass.  Find new activities to keep your hands busy. Play with a pen, coin, or rubber band. Doodle or draw things on paper.  Brush your teeth right after eating. This will help cut down on the craving for the taste of tobacco after meals. You can try mouthwash, too.  Use oral substitutes, such as lemon drops, carrots, a cinnamon stick, or chewing gum, in place of cigarettes. Keep them handy so they are available when you have the urge to smoke.  When you have the urge to smoke, try deep breathing.  Designate your home as a nonsmoking area.  If you are a heavy smoker, ask your caregiver about a prescription for nicotine chewing gum. It can ease your withdrawal from nicotine.  Reward yourself. Set aside the cigarette money you save and buy yourself something nice.  Look for support from others. Join a support group or smoking cessation program. Ask someone at home or at work to help you with your plan to quit smoking.  Always ask yourself, "Do I need this cigarette or is this just a reflex?" Tell yourself, "Today, I choose not to smoke," or "I do not want to smoke." You are reminding yourself of your decision to quit, even if you do smoke a cigarette. HOW WILL I FEEL WHEN   I QUIT SMOKING?  The benefits of not smoking start within days of quitting.  You may have symptoms of withdrawal because your body is used to nicotine (the addictive substance in cigarettes). You may crave cigarettes, be irritable, feel very hungry, cough often, get headaches, or have difficulty concentrating.  The withdrawal symptoms are only temporary. They are strongest when you first quit but will go away within 10 to 14 days.  When withdrawal symptoms occur, stay in control. Think about your reasons for quitting. Remind yourself that these are  signs that your body is healing and getting used to being without cigarettes.  Remember that withdrawal symptoms are easier to treat than the major diseases that smoking can cause.  Even after the withdrawal is over, expect periodic urges to smoke. However, these cravings are generally short-lived and will go away whether you smoke or not. Do not smoke!  If you relapse and smoke again, do not lose hope. Most smokers quit 3 times before they are successful.  If you relapse, do not give up! Plan ahead and think about what you will do the next time you get the urge to smoke. LIFE AS A NONSMOKER: MAKE IT FOR A MONTH, MAKE IT FOR LIFE Day 1: Hang this page where you will see it every day. Day 2: Get rid of all ashtrays, matches, and lighters. Day 3: Drink water. Breathe deeply between sips. Day 4: Avoid places with smoke-filled air, such as bars, clubs, or the smoking section of restaurants. Day 5: Keep track of how much money you save by not smoking. Day 6: Avoid boredom. Keep a good book with you or go to the movies. Day 7: Reward yourself! One week without smoking! Day 8: Make a dental appointment to get your teeth cleaned. Day 9: Decide how you will turn down a cigarette before it is offered to you. Day 10: Review your reasons for quitting. Day 11: Distract yourself. Stay active to keep your mind off smoking and to relieve tension. Take a walk, exercise, read a book, do a crossword puzzle, or try a new hobby. Day 12: Exercise. Get off the bus before your stop or use stairs instead of escalators. Day 13: Call on friends for support and encouragement. Day 14: Reward yourself! Two weeks without smoking! Day 15: Practice deep breathing exercises. Day 16: Bet a friend that you can stay a nonsmoker. Day 17: Ask to sit in nonsmoking sections of restaurants. Day 18: Hang up "No Smoking" signs. Day 19: Think of yourself as a nonsmoker. Day 20: Each morning, tell yourself you will not smoke. Day  21: Reward yourself! Three weeks without smoking! Day 22: Think of smoking in negative ways. Remember how it stains your teeth, gives you bad breath, and leaves you short of breath. Day 23: Eat a nutritious breakfast. Day 24:Do not relive your days as a smoker. Day 25: Hold a pencil in your hand when talking on the telephone. Day 26: Tell all your friends you do not smoke. Day 27: Think about how much better food tastes. Day 28: Remember, one cigarette is one too many. Day 29: Take up a hobby that will keep your hands busy. Day 30: Congratulations! One month without smoking! Give yourself a big reward. Your caregiver can direct you to community resources or hospitals for support, which may include:  Group support.  Education.  Hypnosis.  Subliminal therapy. Document Released: 11/28/2003 Document Revised: 05/24/2011 Document Reviewed: 12/16/2008 [facility] Patient Information 2013 [place], [place].  Hypercholesterolemia High Blood Cholesterol Cholesterol is a white, waxy, fat-like protein needed by your body in small amounts. The liver makes all the cholesterol you need. It is carried from the liver by the blood through the blood vessels. Deposits (plaque) may build up on blood vessel walls. This makes the arteries narrower and stiffer. Plaque increases the risk for heart attack and stroke. You cannot feel your cholesterol level even if it is very high. The only way to know is by a blood test to check your lipid (fats) levels. Once you know your cholesterol levels, you should keep a record of the test results. Work with your caregiver to to keep your levels in the desired range. WHAT THE RESULTS MEAN:  Total cholesterol is a rough measure of all the cholesterol in your blood.  LDL is the so-called bad cholesterol. This is the type that deposits cholesterol in the walls of the arteries. You want this level to be low.  HDL is the good cholesterol because it cleans the arteries and  carries the LDL away. You want this level to be high.  Triglycerides are fat that the body can either burn for energy or store. High levels are closely linked to heart disease. DESIRED LEVELS:  Total cholesterol below 200.  LDL below 100 for people at risk, below 70 for very high risk.  HDL above 50 is good, above 60 is best.  Triglycerides below 150. HOW TO LOWER YOUR CHOLESTEROL:  Diet.  Choose fish or white meat chicken and [place], roasted or baked. Limit fatty cuts of red meat, fried foods, and processed meats, such as sausage and lunch meat.  Eat lots of fresh fruits and vegetables. Choose whole grains, beans, pasta, potatoes and cereals.  Use only small amounts of olive, corn or canola oils. Avoid butter, mayonnaise, shortening or palm kernel oils. Avoid foods with trans-fats.  Use skim/nonfat milk and low-fat/nonfat yogurt and cheeses. Avoid whole milk, cream, ice cream, egg yolks and cheeses. Healthy desserts include angel food cake, gingersnaps, animal crackers, hard candy, popsicles, and low-fat/nonfat frozen yogurt. Avoid pastries, cakes, pies and cookies.  Exercise.  A regular program helps decrease LDL and raises HDL.  Helps with weight control.  Do things that increase your activity level like gardening, walking, or taking the stairs.  Medication.  May be prescribed by your caregiver to help lowering cholesterol and the risk for heart disease.  You may need medicine even if your levels are normal if you have several risk factors. HOME CARE INSTRUCTIONS   Follow your diet and exercise programs as suggested by your caregiver.  Take medications as directed.  Have blood work done when your caregiver feels it is necessary. MAKE SURE YOU:   Understand these instructions.  Will watch your condition.  Will get help right away if you are not doing well or get worse. Document Released: 03/01/2005 Document Revised: 05/24/2011 Document Reviewed:  08/17/2006 [facility] Patient Information 2013 [place], [place].

## 2012-04-14 NOTE — Progress Notes (Signed)
S:  This 57 y.o. Cauc female has HTN, recently diagnosed; she reports her psychiatrist has been advising her of elevated BP readings. She is a smoker who is trying to quit. She has been rushing this AM, smoked a cigarette and had a "bad cup of coffee". She acknowledges need for improved nutrition and more activity. Medications are well tolerated ; Lisinopril was prescribed when pt was at hospital w/ BP =200+. She denies diaphoresis, fatigue, CP or tightness, palpitations, edema, SOB or DOE, cough, abd pain, myalgias or muscle cramps, HA, dizziness, weakness, numbness or syncope.  Pt had no recent CPE/PAP; last had exam, MMG, etc. after discharge from the [facility] [employer] [facility] [employer]; she served 14 years in [employer].  ROS: As per HPI.  O:  Filed Vitals:   04/14/12 0805  BP: 154/80  Pulse: 77  Temp: 97.6 F (36.4 C)  Resp: 16   GEN: In NAD; WN/WD. HENT: [place]/AT. EOMI w/ clear conj, muddy sclerae. Oroph clear and moist w/ good dentition. NECK: Supple w/o LAN or TMG. COR: RRR. No m/g/r. No peripheral edema. LUNGS: CTA; normal resp rate and effort. No wheezes, rales or rhonchi. MS: MAEs; No c/c/e. No deformities. SKIN: W&D. No rashes or pallor. NEURO: A&O x 3; CNs intact. Nonfocal.  A/P:  1. HTN (hypertension)     Continue current medications- Metoprolol, Lisinopril and HCTZ. Encouraged weight reduction and healthier nutrition.  2. Tobacco dependence     Encouraged cessation before next visit.  3. Hypercholesteremia

## 2012-04-16 DIAGNOSIS — E7800 Pure hypercholesterolemia, unspecified: Secondary | ICD-10-CM | POA: Insufficient documentation

## 2012-04-18 MED ORDER — ATORVASTATIN CALCIUM 20 MG PO TABS: 20.0000 mg | ORAL_TABLET | Freq: Every day | ORAL | Status: DC

## 2012-06-05 ENCOUNTER — Telehealth: Payer: Self-pay

## 2012-06-05 ENCOUNTER — Telehealth: Payer: Self-pay | Admitting: Radiology

## 2012-06-05 NOTE — Telephone Encounter (Signed)
Patient complaint is the lisinopril (PRINIVIL,ZESTRIL) 10 MG tablet Gives her a cough that is so violent she throws up.  (needs something for her HBP)   CBN:  [phone]

## 2012-06-05 NOTE — Telephone Encounter (Signed)
Thanks, called her to advise.  

## 2012-06-05 NOTE — Telephone Encounter (Signed)
Patient has made appt with you for her BP meds, the lisinopril is causing her to cough she would like to not come in to the urgent care please advise if there is anything we can do for her, while she is awaiting the appt.

## 2012-06-05 NOTE — Telephone Encounter (Signed)
Pt needs an OV  

## 2012-06-07 ENCOUNTER — Other Ambulatory Visit (HOSPITAL_COMMUNITY): Payer: Self-pay | Admitting: Family Medicine

## 2012-06-07 NOTE — Telephone Encounter (Signed)
Called her to advise. Left detailed message.  

## 2012-06-07 NOTE — Telephone Encounter (Signed)
Advise pt to stop taking the Lisinopril but continue other medications. Other lifestyle changes that she should be practicing include low salt/ no salt nutrition as well as limiting processed and high-sugar content foods. Reduce caffeine intake also.

## 2012-06-15 ENCOUNTER — Encounter: Payer: Self-pay | Admitting: Family Medicine

## 2012-06-15 ENCOUNTER — Other Ambulatory Visit: Payer: Self-pay | Admitting: Radiology

## 2012-06-15 ENCOUNTER — Ambulatory Visit (INDEPENDENT_AMBULATORY_CARE_PROVIDER_SITE_OTHER): Payer: Medicare Other | Admitting: Family Medicine

## 2012-06-15 VITALS — BP 128/74 | HR 95 | Temp 96.6°F | Resp 16 | Ht 65.0 in | Wt 163.0 lb

## 2012-06-15 DIAGNOSIS — T887XXA Unspecified adverse effect of drug or medicament, initial encounter: Secondary | ICD-10-CM

## 2012-06-15 DIAGNOSIS — I10 Essential (primary) hypertension: Secondary | ICD-10-CM | POA: Diagnosis not present

## 2012-06-15 DIAGNOSIS — Z720 Tobacco use: Secondary | ICD-10-CM | POA: Insufficient documentation

## 2012-06-15 DIAGNOSIS — F17200 Nicotine dependence, unspecified, uncomplicated: Secondary | ICD-10-CM | POA: Diagnosis not present

## 2012-06-15 DIAGNOSIS — T50905A Adverse effect of unspecified drugs, medicaments and biological substances, initial encounter: Secondary | ICD-10-CM

## 2012-06-15 MED ORDER — HYDROCHLOROTHIAZIDE 25 MG PO TABS: 25.0000 mg | ORAL_TABLET | Freq: Every day | ORAL | Status: DC

## 2012-06-15 MED ORDER — VENLAFAXINE HCL ER 75 MG PO CP24: 75.0000 mg | ORAL_CAPSULE | Freq: Every day | ORAL | Status: DC

## 2012-06-15 MED ORDER — METOPROLOL SUCCINATE ER 50 MG PO TB24: 50.0000 mg | ORAL_TABLET | Freq: Every day | ORAL | Status: DC

## 2012-06-15 MED ORDER — VENLAFAXINE HCL 75 MG PO TABS: 75.0000 mg | ORAL_TABLET | Freq: Every day | ORAL | Status: DC

## 2012-06-15 NOTE — Patient Instructions (Signed)

## 2012-06-15 NOTE — Progress Notes (Signed)
S:  This 57 y.o. Cauc female smoker has HTN; she c/o cough and body aches assoociated w/ Lisinopril. She stopped the medication about 1 week ago and feels better. She denies CP or tightness, palpitations, HA or dizziness, weakness or syncope since stopping medication.  No associated symptoms such as fever/chills, rhinorrhea, sinus pain, rash, chest pains or flu-like symptoms were associated w/ cough.  Tobacco use is another concern. Pt desires to quit but is apprehensive about Chantix; Wellbutrin ineffective. She wants to stop smoking and wants some advise; she is tired of the smell and has no support from family. Weight gain is another issue; she has changed her nutrition and plans to resume working w/ horses.  ROS: As per HPI.  O:  Filed Vitals:   06/15/12 1011  BP: 128/74  Pulse: 95  Temp: 96.6 F (35.9 C)  Resp: 16   GEN: In NAD; WN,WD. Smells of cigarettes. HEENT: [place]/AT; EOMI w/ clear conj/sclerae. EACs normal w/ TM scarring bilat. Post ph w/ erythema; no exudate or edema. NECK: Supple w/o  LAN or TMG. COR: RRR; no m/g/r. LUNGS: CTA; no wheezes or rales. SKIN: W&D; no rashes or pallor. MS: MAEs w/ FROM. No joint deformities. No edema. NEURO: A&O x 3; CNs intact. Nonfocal.  A/P: HTN, goal below 140/90- stable off Lisinopril; continue only Metoprolol succ and HCTZ; monitor BP at  Home.  Tobacco user- discussed strategies for cessation; pt is willing to even consider Hypnosis.  Medication side effect, initial encounter- D/C Lisinopril.

## 2012-06-15 NOTE — Telephone Encounter (Signed)
Corrected Effexor, pharmacy indicated it should be XR, this is okay to send per Dr Audria Nine. Since this is what she previously was taking.

## 2012-06-23 DIAGNOSIS — F322 Major depressive disorder, single episode, severe without psychotic features: Secondary | ICD-10-CM | POA: Diagnosis not present

## 2012-06-23 DIAGNOSIS — F609 Personality disorder, unspecified: Secondary | ICD-10-CM | POA: Diagnosis not present

## 2012-07-13 ENCOUNTER — Encounter: Payer: Medicare Other | Admitting: Family Medicine

## 2012-07-31 ENCOUNTER — Other Ambulatory Visit: Payer: Self-pay | Admitting: Emergency Medicine

## 2012-08-29 ENCOUNTER — Encounter: Payer: Medicare Other | Admitting: Family Medicine

## 2012-09-22 DIAGNOSIS — F609 Personality disorder, unspecified: Secondary | ICD-10-CM | POA: Diagnosis not present

## 2012-09-22 DIAGNOSIS — F322 Major depressive disorder, single episode, severe without psychotic features: Secondary | ICD-10-CM | POA: Diagnosis not present

## 2012-12-22 DIAGNOSIS — F321 Major depressive disorder, single episode, moderate: Secondary | ICD-10-CM | POA: Diagnosis not present

## 2013-01-25 ENCOUNTER — Other Ambulatory Visit: Payer: Self-pay | Admitting: Family Medicine

## 2013-02-13 ENCOUNTER — Ambulatory Visit (INDEPENDENT_AMBULATORY_CARE_PROVIDER_SITE_OTHER): Payer: Medicare Other | Admitting: Family Medicine

## 2013-02-13 VITALS — BP 116/72 | HR 78 | Temp 98.1°F | Resp 18 | Ht 65.0 in | Wt 163.0 lb

## 2013-02-13 DIAGNOSIS — Z23 Encounter for immunization: Secondary | ICD-10-CM

## 2013-02-13 DIAGNOSIS — S61209A Unspecified open wound of unspecified finger without damage to nail, initial encounter: Secondary | ICD-10-CM | POA: Diagnosis not present

## 2013-02-13 DIAGNOSIS — S61219A Laceration without foreign body of unspecified finger without damage to nail, initial encounter: Secondary | ICD-10-CM

## 2013-02-13 DIAGNOSIS — F411 Generalized anxiety disorder: Secondary | ICD-10-CM | POA: Diagnosis not present

## 2013-02-13 DIAGNOSIS — S61258A Open bite of other finger without damage to nail, initial encounter: Secondary | ICD-10-CM

## 2013-02-13 MED ORDER — CLINDAMYCIN HCL 300 MG PO CAPS: 300.0000 mg | ORAL_CAPSULE | Freq: Four times a day (QID) | ORAL | Status: DC

## 2013-02-13 MED ORDER — ALPRAZOLAM 0.25 MG PO TABS
0.2500 mg | ORAL_TABLET | Freq: Once | ORAL | Status: AC
  Administered 2013-02-13: 0.25 mg via ORAL

## 2013-02-13 MED ORDER — LEVOFLOXACIN 500 MG PO TABS: 500.0000 mg | ORAL_TABLET | Freq: Every day | ORAL | Status: DC

## 2013-02-13 MED ORDER — HYDROCODONE-ACETAMINOPHEN 5-325 MG PO TABS: 1.0000 | ORAL_TABLET | Freq: Four times a day (QID) | ORAL | Status: DC | PRN

## 2013-02-13 NOTE — Progress Notes (Signed)
   Patient ID: KATERA RYBKA MRN: [id number], DOB: 12/19/1955, 57 y.o. Date of Encounter: 02/13/2013, 4:19 PM   PROCEDURE NOTE: Verbal consent obtained.  Risks and benefits of the procedure were explained. Patient made an informed decision to proceed with the procedure. Sterile technique employed. Numbing: Anesthesia obtained with 2% plain lidocaine for digital block 4 cc total.   Cleansed with soap and water. Irrigated.   Wound explored, no deep structures involved, no foreign bodies.   Wound repaired loosely with # 5 simple interrupted sutures of 5-0 Prolene.  Hemostasis obtained. Wound cleansed and dressed.  Wound care instructions including precautions covered with patient. Handout given.  Anticipate suture removal in 10 days.   Signed, Eula Listen, PA-C [facility] [place], [place] [address] [phone] 02/13/2013 4:19 PM

## 2013-02-13 NOTE — Progress Notes (Signed)
Subjective:    Patient ID: Gwendolyn Pham, female    DOB: 09/20/1955, 57 y.o.   MRN: [id number] Chief Complaint  Patient presents with  . Animal Bite    right hand pointer finger     Animal Bite     HPI Comments: Gwendolyn Pham is a 57 y.o. female who presents to [facility] with a dog bite to her right second finger over the dorsal PIP joint onset less than 2 yrs ago by a dog at a [employer] company.  The animal's shots are UTD inc rabies vaccine and is cared for [facility] animal clinic per the dog's owner who apparently is a PA.  The pt does have a huge needle phobia - had a really bad experience during a cat wound repair and is terrified of getting stiches. Is by herself at clinic and has no one to pick her up so needs to drive home. Is on effexor for her chronic anxiety. Is not UTD on her tetanus shot. Penicilln causes a very severe itchy rash.  Past Medical History  Diagnosis Date  . Anxiety   . Asthma   . Depression   . Hypertension    3:49 PM-Ordered 25 mg benadryl, 20 mg Pepcid and 60 mg prednisone   Current Outpatient Prescriptions on File Prior to Visit  Medication Sig Dispense Refill  . aspirin EC 81 MG tablet Take 1 tablet (81 mg total) by mouth daily.      Marland Kitchen atorvastatin (LIPITOR) 20 MG tablet Take 1 tablet (20 mg total) by mouth daily.  90 tablet  3  . hydrochlorothiazide (HYDRODIURIL) 25 MG tablet Take 1 tablet (25 mg total) by mouth daily. PATIENT NEEDS OFFICE VISIT FOR ADDITIONAL REFILLS  30 tablet  0  . metoprolol succinate (TOPROL-XL) 50 MG 24 hr tablet Take 1 tablet (50 mg total) by mouth daily. PATIENT NEEDS OFFICE VISIT FOR ADDITIONAL REFILLS  30 tablet  0  . venlafaxine XR (EFFEXOR XR) 75 MG 24 hr capsule Take 1 capsule (75 mg total) by mouth daily.  30 capsule  5   No current facility-administered medications on file prior to visit.     Review of Systems  Constitutional: Negative for fever, chills, diaphoresis and activity change.  Musculoskeletal:  Positive for arthralgias, joint swelling and myalgias. Negative for gait problem.  Skin: Positive for color change and wound. Negative for pallor and rash.  Hematological: Negative for adenopathy. Does not bruise/bleed easily.  Psychiatric/Behavioral: The patient is nervous/anxious.    BP 116/72  Pulse 78  Temp(Src) 98.1 F (36.7 C) (Oral)  Resp 18  Ht 5\' 5"  (1.651 m)  Wt 163 lb (73.936 kg)  BMI 27.12 kg/m2  SpO2 96%    Objective:   Physical Exam  Nursing note and vitals reviewed. Constitutional: She is oriented to person, place, and time. She appears well-developed and well-nourished.  HENT:  Head: Normocephalic and atraumatic.  Eyes: Conjunctivae and EOM are normal. Pupils are equal, round, and reactive to light.  Neck: Normal range of motion. Neck supple.  Cardiovascular: Normal rate, regular rhythm and normal heart sounds.   Pulmonary/Chest: Effort normal and breath sounds normal.  Abdominal: Soft. Bowel sounds are normal.  Musculoskeletal: Normal range of motion.       Left hand: She exhibits tenderness and laceration. She exhibits normal range of motion, no bony tenderness, normal capillary refill and no swelling. Normal sensation noted. Normal strength noted. She exhibits no finger abduction and no thumb/finger opposition.  Left  2nd finger w/ normal ROM, normal strength, and normal sensation.  Neurological: She is alert and oriented to person, place, and time.  Skin: Skin is warm and dry. Laceration noted.  Flap laceration through dermis on dorsal aspect of left 2nd pip joint.  Psychiatric: She has a normal mood and affect. Her behavior is normal.      Assessment & Plan:   Generalized anxiety disorder - Plan: ALPRAZolam (XANAX) tablet 0.25 mg.  On effexor regularly  Dog bite of index finger, initial encounter - Plan: Tdap vaccine greater than or equal to 7yo IM - Animal control report completed by assistant Eileen Stanford.  Laceration of finger, initial encounter - s/p repair  today. Pt has severe needle phobia - was shaking and crying when she was told that she would need stitches so given 0.25mg  po xanax which did help somewhat. Since laceration needed closure due to bleeding, location, and depth, will go ahead and place on antibiotic treatment to cover for infection. However, pt w/ penicillin allergy so cannot use augmentin for prophylaxis.  Need for prophylactic vaccination with combined diphtheria-tetanus-pertussis (DTP) vaccine - Plan: Tdap vaccine greater than or equal to 7yo IM  Meds ordered this encounter  Medications  . ALPRAZolam (XANAX) tablet 0.25 mg    Sig:   . clindamycin (CLEOCIN) 300 MG capsule    Sig: Take 1 capsule (300 mg total) by mouth 4 (four) times daily.    Dispense:  28 capsule    Refill:  0  . levofloxacin (LEVAQUIN) 500 MG tablet    Sig: Take 1 tablet (500 mg total) by mouth daily.    Dispense:  7 tablet    Refill:  0  . HYDROcodone-acetaminophen (NORCO/VICODIN) 5-325 MG per tablet    Sig: Take 1 tablet by mouth every 6 (six) hours as needed for moderate pain.    Dispense:  15 tablet    Refill:  0    I personally performed the services described in this documentation, which was scribed in my presence. The recorded information has been reviewed and considered, and addended by me as needed.  Norberto Sorenson, MD MPH

## 2013-02-13 NOTE — Patient Instructions (Signed)

## 2013-02-14 ENCOUNTER — Telehealth: Payer: Self-pay

## 2013-02-14 NOTE — Telephone Encounter (Signed)
I looked in the note, it doesn't specify which ointment to use. Please advise.

## 2013-02-14 NOTE — Telephone Encounter (Signed)
Called to advise.  

## 2013-02-14 NOTE — Telephone Encounter (Signed)
PT WAS SEEN FOR A DOG BITE YESTERDAY AND TOLD TO PUT AN OINTMENT ON IT, SHE HAVE NO IDEA WHAT TO PUT ON IT OR HOW TO DO IT. REALLY NEED TO KNOW ASAP SINCE SHE  IS GETTING READY TO CHANGE THE BANDAGE. PLEASE CALL [phone]

## 2013-02-14 NOTE — Telephone Encounter (Signed)
I do not see that we have prescribed an ointment, and generally wounds are to be left clean and dry, although I think it would be ok for her to use a small amount of triple antibiotic ointment if she would like. Otherwise I think she can leave it dry

## 2013-02-22 ENCOUNTER — Encounter: Payer: Self-pay | Admitting: Family Medicine

## 2013-02-22 ENCOUNTER — Ambulatory Visit (INDEPENDENT_AMBULATORY_CARE_PROVIDER_SITE_OTHER): Payer: Medicare Other | Admitting: Family Medicine

## 2013-02-22 VITALS — BP 148/80 | HR 82 | Temp 97.8°F | Resp 16 | Ht 65.0 in

## 2013-02-22 DIAGNOSIS — K219 Gastro-esophageal reflux disease without esophagitis: Secondary | ICD-10-CM | POA: Diagnosis not present

## 2013-02-22 DIAGNOSIS — F17200 Nicotine dependence, unspecified, uncomplicated: Secondary | ICD-10-CM

## 2013-02-22 DIAGNOSIS — R7309 Other abnormal glucose: Secondary | ICD-10-CM

## 2013-02-22 DIAGNOSIS — I10 Essential (primary) hypertension: Secondary | ICD-10-CM | POA: Insufficient documentation

## 2013-02-22 DIAGNOSIS — Z720 Tobacco use: Secondary | ICD-10-CM

## 2013-02-22 DIAGNOSIS — R739 Hyperglycemia, unspecified: Secondary | ICD-10-CM

## 2013-02-22 LAB — COMPLETE METABOLIC PANEL WITH GFR
ALT: 11 U/L (ref 0–35)
AST: 11 U/L (ref 0–37)
Creat: 0.6 mg/dL (ref 0.50–1.10)
Sodium: 137 mEq/L (ref 135–145)
Total Bilirubin: 0.3 mg/dL (ref 0.3–1.2)
Total Protein: 6.6 g/dL (ref 6.0–8.3)

## 2013-02-22 MED ORDER — METOPROLOL SUCCINATE ER 50 MG PO TB24: 50.0000 mg | ORAL_TABLET | Freq: Every day | ORAL | Status: DC

## 2013-02-22 MED ORDER — VENLAFAXINE HCL ER 75 MG PO CP24: 75.0000 mg | ORAL_CAPSULE | Freq: Every day | ORAL | Status: DC

## 2013-02-22 MED ORDER — ATORVASTATIN CALCIUM 20 MG PO TABS: 20.0000 mg | ORAL_TABLET | Freq: Every day | ORAL | Status: DC

## 2013-02-22 MED ORDER — HYDROCHLOROTHIAZIDE 25 MG PO TABS: 25.0000 mg | ORAL_TABLET | Freq: Every day | ORAL | Status: DC

## 2013-02-22 NOTE — Patient Instructions (Addendum)
Diet for Gastroesophageal Reflux Disease, Adult Reflux (acid reflux) is when acid from your stomach flows up into the esophagus. When acid comes in contact with the esophagus, the acid causes irritation and soreness (inflammation) in the esophagus. When reflux happens often or so severely that it causes damage to the esophagus, it is called gastroesophageal reflux disease (GERD). Nutrition therapy can help ease the discomfort of GERD. FOODS OR DRINKS TO AVOID OR LIMIT  Smoking or chewing tobacco. Nicotine is one of the most potent stimulants to acid production in the gastrointestinal tract.  Caffeinated and decaffeinated coffee and black tea.  Regular or low-calorie carbonated beverages or energy drinks (caffeine-free carbonated beverages are allowed).   Strong spices, such as black pepper, white pepper, red pepper, cayenne, curry powder, and chili powder.  Peppermint or spearmint.  Chocolate.  High-fat foods, including meats and fried foods. Extra added fats including oils, butter, salad dressings, and nuts. Limit these to less than 8 tsp per day.  Fruits and vegetables if they are not tolerated, such as citrus fruits or tomatoes.  Alcohol.  Any food that seems to aggravate your condition. If you have questions regarding your diet, call your caregiver or a registered dietitian. OTHER THINGS THAT MAY HELP GERD INCLUDE:   Eating your meals slowly, in a relaxed setting.  Eating 5 to 6 small meals per day instead of 3 large meals.  Eliminating food for a period of time if it causes distress.  Not lying down until 3 hours after eating a meal.  Keeping the head of your bed raised 6 to 9 inches (15 to 23 cm) by using a foam wedge or blocks under the legs of the bed. Lying flat may make symptoms worse.  Being physically active. Weight loss may be helpful in reducing reflux in overweight or obese adults.  Wear loose fitting clothing EXAMPLE MEAL PLAN This meal plan is approximately  2,000 calories based on https://www.[facility].org/ meal planning guidelines. Breakfast   cup cooked oatmeal.  1 cup strawberries.  1 cup low-fat milk.  1 oz almonds. Snack  1 cup cucumber slices.  6 oz yogurt (made from low-fat or fat-free milk). Lunch  2 slice whole-wheat bread.  2 oz sliced [place].  2 tsp mayonnaise.  1 cup blueberries.  1 cup snap peas. Snack  6 whole-wheat crackers.  1 oz string cheese. Dinner   cup brown rice.  1 cup mixed veggies.  1 tsp olive oil.  3 oz grilled fish. Document Released: 03/01/2005 Document Revised: 05/24/2011 Document Reviewed: 01/15/2011 [facility] Patient Information 2014 [place], [place].   You can try TUMS for heartburn and indigestion. Modify your diet and try to lose weight. I will see your next month to see if you need more evaluation or referral to a GI specialist. I will have the results of labs within a few days.

## 2013-02-23 ENCOUNTER — Encounter: Payer: Self-pay | Admitting: Family Medicine

## 2013-02-23 ENCOUNTER — Ambulatory Visit (INDEPENDENT_AMBULATORY_CARE_PROVIDER_SITE_OTHER): Payer: Medicare Other | Admitting: Physician Assistant

## 2013-02-23 VITALS — BP 122/70 | HR 86 | Temp 98.0°F | Resp 16

## 2013-02-23 DIAGNOSIS — Z4802 Encounter for removal of sutures: Secondary | ICD-10-CM

## 2013-02-23 LAB — H. PYLORI ANTIBODY, IGG: H Pylori IgG: <0.4 {ISR}

## 2013-02-23 NOTE — Progress Notes (Signed)
   Patient ID: Gwendolyn Pham MRN: [id number], DOB: [birth year] 57 y.o. Date of Encounter: 02/23/2013, 5:51 PM  Primary Physician: Default, Provider, MD  Chief Complaint: Suture removal    See note from 02/13/13  HPI: 57 y.o. female with injury to right 2nd finger Here for suture removal s/p placement on 02/13/13 Doing well No issues/complaints Afebrile/ No chills No erythema No pain Able to move without difficulty Normal sensation  Past Medical History  Diagnosis Date  . Anxiety   . Asthma   . Depression   . Hypertension      Home Meds: Prior to Admission medications   Medication Sig Start Date End Date Taking? Authorizing Provider  aspirin EC 81 MG tablet Take 1 tablet (81 mg total) by mouth daily. 04/04/12  Yes Henderson Cloud, MD  atorvastatin (LIPITOR) 20 MG tablet Take 1 tablet (20 mg total) by mouth daily. 02/22/13  Yes Maurice March, MD  clindamycin (CLEOCIN) 300 MG capsule Take 1 capsule (300 mg total) by mouth 4 (four) times daily. 02/13/13  Yes Sherren Mocha, MD  hydrochlorothiazide (HYDRODIURIL) 25 MG tablet Take 1 tablet (25 mg total) by mouth daily. 02/22/13  Yes Maurice March, MD  HYDROcodone-acetaminophen (NORCO/VICODIN) 5-325 MG per tablet Take 1 tablet by mouth every 6 (six) hours as needed for moderate pain. 02/13/13  Yes Sherren Mocha, MD  metoprolol succinate (TOPROL-XL) 50 MG 24 hr tablet Take 1 tablet (50 mg total) by mouth daily. 02/22/13  Yes Maurice March, MD  venlafaxine XR (EFFEXOR XR) 75 MG 24 hr capsule Take 1 capsule (75 mg total) by mouth daily. 02/22/13  Yes Maurice March, MD    Allergies:  Allergies  Allergen Reactions  . Lisinopril Cough    Muscle pain   . Motrin [Ibuprofen]   . Penicillins Rash    Physical Exam: Blood pressure 122/70, pulse 86, temperature 98 F (36.7 C), temperature source Oral, resp. rate 16, SpO2 95.00%., There is no weight on file to calculate BMI. General: Well developed, well  nourished, in no acute distress. Head: Normocephalic, atraumatic, sclera non-icteric, no xanthomas, nares are without discharge.  Neck: Supple. Lungs: Breathing is unlabored. Heart: Normal rate. Msk:  Strength and tone appear normal for age. Wound: Wound well healed without erythema, swelling, or tenderness to palpation. FROM and 5/5 strength with normal sensation throughout. Skin: See above, otherwise dry without rash or erythema. Extremities: No clubbing or cyanosis. No edema. Neuro: Alert and oriented X 3. Moves all extremities spontaneously.  Psych:  Responds to questions appropriately with a normal affect.   PROCEDURE: Verbal consent obtained. 5 sutures removed without difficulty.  Assessment and Plan: 57 y.o. female here for suture removal for wound described above. -Sutures removed per above -Wound resolved -RTC prn  Signed, Eula Listen, PA-C [facility] [place], [place] [address] [phone] 02/23/2013 5:51 PM

## 2013-02-24 NOTE — Progress Notes (Signed)
S:  This 57 y.o. Cauc female is here for HTN follow-up; she is compliant w/ medications and states BP readings at home are "about the same" as reading today. Today, she is "a little stressed" about a number of issues. C/o mild fatigue and inability to lose weight; not following a meal plan and has no idea about her calorie consumption. Does not exercise regularly.  She has no diaphoresis, abnormal weight loss, vision disturbances, CP or tightness, palpitations, HA, dizziness, weakness or syncope.   She does continue to smoke but pans to quit soon. Pt denies SOB or DOE. She does have a slightly prod cough but no fever/chill or chest wall pain. COntinues to take prescribed antibiotics for dog bite; has an appt for suture removal tomorrow at [facility].   Pt c/o mild burning in midchest, increased after meals and at night time when reclining. No specific food intolerances. She endorses heartburn and reflux symptoms. Has not tried any OTC meds.  Has never been treated for ulcers.  Patient Active Problem List   Diagnosis Date Noted  . HTN, goal below 140/90 02/22/2013  . GERD (gastroesophageal reflux disease) 02/22/2013  . Tobacco user 06/15/2012  . Hypercholesteremia 04/16/2012  . Anxiety 04/04/2012  . Depression 04/04/2012   PMHx, Soc Hx and Fam reviewed.  Medications reconciled.  ROS: As per HPI.  O: Filed Vitals:   02/22/13 1448  BP: 148/80  Pulse: 82  Temp: 97.8 F (36.6 C)  Resp: 16   GEN: In NAD; WN,WD. Pt smells slightly of tobacco. HENT: [place]/AT; EOMI w/ clear conj/sclerae. EACs/TMs/ nasal mucosa/oroph clear and moist. COR: RRR. Normal S1 and S2. LUNGS: CTA; no wheezes or rhonchi. BACK: No CVAT. ABD; Normal appearance w/ slight distention. Normal BS w/ mild epig tenderness. Murphy's sign negative. No midline pulsatile mass. No HSM or masses or rebound/guarding. SKIN: W&D; intact sutures noted on R 2nd finger. Mild redness but no purulent drainage. NEURO: A&O x 3; CNs intact.  Nofocal.  A1c= 6.3%  A/P: HTN, goal below 140/90 -  Stable on current medications; no change in treatment. Plan: COMPLETE METABOLIC PANEL WITH GFR  GERD (gastroesophageal reflux disease) - Dietary modifications, weight loss and smoking cessation advised. Try OTC medication; pt agreeable to trying TUMS.  Plan: H. pylori antibody, IgG  Tobacco user- Strongly encouraged cessation.  Hyperglycemia - Plan: POCT glycosylated hemoglobin (Hb A1C)  Meds ordered this encounter  Medications  . hydrochlorothiazide (HYDRODIURIL) 25 MG tablet    Sig: Take 1 tablet (25 mg total) by mouth daily.    Dispense:  30 tablet    Refill:  5  . venlafaxine XR (EFFEXOR XR) 75 MG 24 hr capsule    Sig: Take 1 capsule (75 mg total) by mouth daily.    Dispense:  30 capsule    Refill:  5  . atorvastatin (LIPITOR) 20 MG tablet    Sig: Take 1 tablet (20 mg total) by mouth daily.    Dispense:  30 tablet    Refill:  5  . metoprolol succinate (TOPROL-XL) 50 MG 24 hr tablet    Sig: Take 1 tablet (50 mg total) by mouth daily.    Dispense:  30 tablet    Refill:  5

## 2013-04-05 ENCOUNTER — Ambulatory Visit: Payer: Medicare Other | Admitting: Family Medicine

## 2013-05-04 ENCOUNTER — Other Ambulatory Visit: Payer: Self-pay

## 2013-05-04 MED ORDER — METOPROLOL SUCCINATE ER 50 MG PO TB24: 50.0000 mg | ORAL_TABLET | Freq: Every day | ORAL | Status: DC

## 2013-05-04 MED ORDER — HYDROCHLOROTHIAZIDE 25 MG PO TABS: 25.0000 mg | ORAL_TABLET | Freq: Every day | ORAL | Status: DC

## 2013-05-04 MED ORDER — ATORVASTATIN CALCIUM 20 MG PO TABS: 20.0000 mg | ORAL_TABLET | Freq: Every day | ORAL | Status: DC

## 2013-05-04 NOTE — Telephone Encounter (Signed)
Pharm req'd 90 day, so resent.

## 2013-05-15 ENCOUNTER — Ambulatory Visit (INDEPENDENT_AMBULATORY_CARE_PROVIDER_SITE_OTHER): Payer: Medicare Other | Admitting: Family Medicine

## 2013-05-15 ENCOUNTER — Encounter: Payer: Self-pay | Admitting: Family Medicine

## 2013-05-15 VITALS — BP 136/86 | HR 82 | Temp 97.8°F | Resp 16 | Ht 63.0 in

## 2013-05-15 DIAGNOSIS — H5310 Unspecified subjective visual disturbances: Secondary | ICD-10-CM | POA: Diagnosis not present

## 2013-05-15 DIAGNOSIS — F411 Generalized anxiety disorder: Secondary | ICD-10-CM | POA: Diagnosis not present

## 2013-05-15 DIAGNOSIS — E119 Type 2 diabetes mellitus without complications: Secondary | ICD-10-CM

## 2013-05-15 DIAGNOSIS — R350 Frequency of micturition: Secondary | ICD-10-CM | POA: Diagnosis not present

## 2013-05-15 DIAGNOSIS — I10 Essential (primary) hypertension: Secondary | ICD-10-CM | POA: Diagnosis not present

## 2013-05-15 DIAGNOSIS — F419 Anxiety disorder, unspecified: Secondary | ICD-10-CM

## 2013-05-15 LAB — POCT UA - MICROSCOPIC ONLY
CRYSTALS, UR, HPF, POC: NEGATIVE
Casts, Ur, LPF, POC: NEGATIVE
YEAST UA: NEGATIVE

## 2013-05-15 LAB — POCT URINALYSIS DIPSTICK
Bilirubin, UA: NEGATIVE
Glucose, UA: NEGATIVE
KETONES UA: NEGATIVE
Nitrite, UA: NEGATIVE
PH UA: 6
PROTEIN UA: NEGATIVE
SPEC GRAV UA: 1.02
UROBILINOGEN UA: 0.2

## 2013-05-15 LAB — GLUCOSE, POCT (MANUAL RESULT ENTRY): POC Glucose: 116 mg/dl — AB (ref 70–99)

## 2013-05-15 MED ORDER — VENLAFAXINE HCL ER 37.5 MG PO CP24: ORAL_CAPSULE | ORAL | Status: DC

## 2013-05-15 MED ORDER — VENLAFAXINE HCL ER 75 MG PO CP24: ORAL_CAPSULE | ORAL | Status: DC

## 2013-05-15 MED ORDER — ATORVASTATIN CALCIUM 20 MG PO TABS: 20.0000 mg | ORAL_TABLET | Freq: Every day | ORAL | Status: DC

## 2013-05-15 MED ORDER — METOPROLOL SUCCINATE ER 50 MG PO TB24: 50.0000 mg | ORAL_TABLET | Freq: Every day | ORAL | Status: DC

## 2013-05-15 MED ORDER — HYDROCHLOROTHIAZIDE 25 MG PO TABS: 25.0000 mg | ORAL_TABLET | Freq: Every day | ORAL | Status: DC

## 2013-05-15 NOTE — Patient Instructions (Signed)
Please schedule for a complete eye exam with an eye care specialist. Anywhere glasses are made, you can get an eye exam ([facility], [facility], [employer], [employer]). Get a good multivitamin for Women over 50 and take it daily. Try to improve your nutrition. Take your Venlafaxine daily (both 75 mg and 37.5 mg) and try to get good restful sleep.  I will see you again in 3 months for a complete physical and fasting labs.  Urinary Frequency The number of times a normal person urinates depends upon how much liquid they take in and how much liquid they are losing. If the temperature is hot and there is high humidity then the person will sweat more and usually breathe a little more frequently. These factors decrease the amount of frequency of urination that would be considered normal. The amount you drink is easily determined, but the amount of fluid lost is sometimes more difficult to calculate.  Fluid is lost in two ways:  Sensible fluid loss is usually measured by the amount of urine that you get rid of. Losses of fluid can also occur with diarrhea.  Insensible fluid loss is more difficult to measure. It is caused by evaporation. Insensible loss of fluid occurs through breathing and sweating. It usually ranges from a little less than a quart to a little more than a quart of fluid a day. In normal temperatures and activity levels the average person may urinate 4 to 7 times in a 24-hour period. Needing to urinate more often than that could indicate a problem. If one urinates 4 to 7 times in 24 hours and has large volumes each time, that could indicate a different problem from one who urinates 4 to 7 times a day and has small volumes. The time of urinating is also an important. Most urinating should be done during the waking hours. Getting up at night to urinate frequently can indicate some problems. CAUSES  The bladder is the organ in your lower abdomen that holds urine. Like a balloon, it swells some as it  fills up. Your nerves sense this and tell you it is time to head for the bathroom. There are a number of reasons that you might feel the need to urinate more often than usual. They include:  Urinary tract infection. This is usually associated with other signs such as burning when you urinate.  In men, problems with the prostate (a walnut-size gland that is located near the tube that carries urine out of your body). There are two reasons why the prostate can cause an increased frequency of urination:  An enlarged prostate that does not let the bladder empty well. If the bladder only half empties when you urinate then it only has half the capacity to fill before you have to urinate again.  The nerves in the bladder become more hypersensitive with an increased size of the prostate even if the bladder empties completely.  Pregnancy.  Obesity. Excess weight is more likely to cause a problem for women more than for men.  Bladder stones or other bladder problems.  Caffeine.  Alcohol.  Medications. For example, drugs that help the body get rid of extra fluid (diuretics) increase urine production. Some other medicines must be taken with lots of fluids.  Muscle or nerve weakness. This might be the result of a spinal cord injury, a stroke, multiple sclerosis or Parkinson's disease.  Long-standing diabetes can decrease the sensation of the bladder. This loss of sensation makes it harder  to sense the bladder needs to be emptied. Over a period of years the bladder is stretched out by constant overfilling. This weakens the bladder muscles so that the bladder does not empty well and has less capacity to fill with new urine.  Interstitial cystitis (also called painful bladder syndrome). This condition develops because the tissues that line the insider of the bladder are inflamed (inflammation is the body's way of reacting to injury or infection). It causes pain and frequent urination. It occurs in women  more often than in men. DIAGNOSIS   To decide what might be causing your urinary frequency, your healthcare provider will probably:  Ask about symptoms you have noticed.  Ask about your overall health. This will include questions about any medications you are taking.  Do a physical examination.  Order some tests. These might include:  A blood test to check for diabetes or other health issues that could be contributing to the problem.  Urine testing. This could measure the flow of urine and the pressure on the bladder.  A test of your neurological system (the brain, spinal cord and nerves). This is the system that senses the need to urinate.  A bladder test to check whether it is emptying completely when you urinate.  Cytoscopy. This test uses a thin tube with a tiny camera on it. It offers a look inside your urethra and bladder to see if there are problems.  Imaging tests. You might be given a contrast dye and then asked to urinate. X-rays are taken to see how your bladder is working. TREATMENT  It is important for you to be evaluated to determine if the amount or frequency that you have is unusual or abnormal. If it is found to be abnormal the cause should be determined and this can usually be found out easily. Depending upon the cause treatment could include medication, stimulation of the nerves, or surgery. There are not too many things that you can do as an individual to change your urinary frequency. It is important that you balance the amount of fluid intake needed to compensate for your activity and the temperature. Medical problems will be diagnosed and taken care of by your physician. There is no particular bladder training such as Kegel's exercises that you can do to help urinary frequency. This is an exercise this is usually done for people who have leaking of urine when they laugh cough or sneeze. HOME CARE INSTRUCTIONS   Take any medications your healthcare provider  prescribed or suggested. Follow the directions carefully.  Practice any lifestyle changes that are recommended. These might include:  Drinking less fluid or drinking at different times of the day. If you need to urinate often during the night, for example, you may need to stop drinking fluids early in the evening.  Cutting down on caffeine or alcohol. They both can make you need to urinate more often than normal. Caffeine is found in coffee, tea and sodas.  Losing weight, if that is recommended.  Keep a journal or a log. You might be asked to record how much you drink and when and when you feel the need to urinate. This will also help evaluate how well the treatment provided by your physician is working. SEEK MEDICAL CARE IF:   Your need to urinate often gets worse.  You feel increased pain or irritation when you urinate.  You notice blood in your urine.  You have questions about any medications that your healthcare provider recommended.  You notice blood, pus or swelling at the site of any test or treatment procedure.  You develop a fever of more than 100.5 F (38.1 C). SEEK IMMEDIATE MEDICAL CARE IF:  You develop a fever of more than 102.0 F (38.9 C). Document Released: 12/26/2008 Document Revised: 05/24/2011 Document Reviewed: 12/26/2008 [facility] Patient Information 2014 [employer], [place].

## 2013-05-15 NOTE — Progress Notes (Signed)
S:  This 58 y.o. Cauc female is here for HTN follow-up and blood sugar re-check. She recently lost one of her dogs and one of her horses. She is still grieving and, for awhile, she did not take her medications. Strange dreams about water have contributed to sleep disturbance. BP readings at [facility] have been above normal (SBP= 200 per pt). She is not sleeping well and takes Venlafaxine XR 37.5 mg 3-4 days of the week; she is not taking Venlafaxine 75 mg daily as prescribed. She has no appetite, stating "food just does not taste good!". She has urinary frequency and is concerned about developing DM as it runs in her family. Her vision has been blurry but she recently passed her driver's license renewal w/o restrictions.Pt denies loss of vision/ monocular blindness or other abnormality of vision fields.  Patient Active Problem List   Diagnosis Date Noted  . HTN, goal below 140/90 02/22/2013  . GERD (gastroesophageal reflux disease) 02/22/2013  . Tobacco user 06/15/2012  . Hypercholesteremia 04/16/2012  . Anxiety 04/04/2012  . Depression 04/04/2012   Prior to Admission medications   Medication Sig Start Date End Date Taking? Authorizing Provider  aspirin EC 81 MG tablet Take 1 tablet (81 mg total) by mouth daily. 04/04/12  Yes Henderson CloudEstela Y Hernandez Acosta, MD  atorvastatin (LIPITOR) 20 MG tablet Take 1 tablet (20 mg total) by mouth daily.   Yes Maurice MarchBarbara B Evelynne Spiers, MD  hydrochlorothiazide (HYDRODIURIL) 25 MG tablet Take 1 tablet (25 mg total) by mouth daily.   Yes Maurice MarchBarbara B Kiamesha Samet, MD  metoprolol succinate (TOPROL-XL) 50 MG 24 hr tablet Take 1 tablet (50 mg total) by mouth daily.   Yes Maurice MarchBarbara B Maahir Horst, MD  venlafaxine XR (EFFEXOR XR) 75 MG 24 hr capsule Take 1 capsule daily with one 37.5 mg capsule.   Yes Maurice MarchBarbara B Rakwon Letourneau, MD  venlafaxine XR (EFFEXOR-XR) 37.5 MG 24 hr capsule Take 1 capsule daily with one 75 mg capsule.   Yes Maurice MarchBarbara B Ying Blankenhorn, MD  HYDROcodone-acetaminophen (NORCO/VICODIN) 5-325  MG per tablet Take 1 tablet by mouth every 6 (six) hours as needed for moderate pain. 02/13/13   Sherren MochaEva N Shaw, MD   PMHx, Surg Hx, Soc and Fam Hx reviewed.  ROS: As per HPI. Negative for anorexia, diaphoresis, increased fatigue, CP or tightness, palpitations, SOB or DOE, edema. GI problems, skin rashes, myalgias/ arthralgias, dizziness, weakness, tremor, unilateral numbness or weakness or syncope. Pt has no behavior changes or agitation, confusion or memory loss. She does have problems w/ "smelly feet".  O: Filed Vitals:   05/15/13 1417  BP: 136/86  Pulse: 82  Temp: 97.8 F (36.6 C)  Resp: 16   GEN: In NAD: WN,WD. Appearance today is slightly disheveled. HENT: [place]/AT; EOMI w/ clear conj/sclerae. EACs/ TMs/ oroph normal. NECK: Supple w/o LAN. COR: RRR. No edema. LUNGS: CTA. Normal resp rate and effort. SKIN: W&D; intact w/o erythema, diaphoresis or pallor. See DM Foot exam. Feet do have a strong odor. NEURO: A&O x 3; CNs intact. Gait is normal. Nonfocal.  Results for orders placed in visit on 05/15/13  GLUCOSE, POCT (MANUAL RESULT ENTRY)      Result Value Ref Range   POC Glucose 116 (*) 70 - 99 mg/dl  POCT URINALYSIS DIPSTICK      Result Value Ref Range   Color, UA yellow     Clarity, UA clear     Glucose, UA neg     Bilirubin, UA neg     Ketones, UA  neg     Spec Grav, UA 1.020     Blood, UA small     pH, UA 6.0     Protein, UA neg     Urobilinogen, UA 0.2     Nitrite, UA neg     Leukocytes, UA Trace    POCT UA - MICROSCOPIC ONLY      Result Value Ref Range   WBC, Ur, HPF, POC 0-3     RBC, urine, microscopic 0-5     Bacteria, U Microscopic 2+     Mucus, UA trace     Epithelial cells, urine per micros 1-6     Crystals, Ur, HPF, POC neg     Casts, Ur, LPF, POC neg     Yeast, UA neg      A/P: Type II or unspecified type diabetes mellitus without mention of complication, not stated as uncontrolled - Dec 2014 A1c= 6.3%. TLCs (therapeutic lifestyle changes)  recommended. Plan: [facility] Diabetes Foot Exam, POCT glucose (manual entry)  HTN, goal below 140/90- Stable and controlled on current medications. Continue same.  Anxiety- Advised pt to take both doses of Venlafaxine daily for total dose= 112.5 mg.  Subjective vision disturbance - Pt to sch appt for vision evaluation. Plan: POCT glucose (manual entry)  Frequent urination - Suspect not clean catch; recheck at next visit if symptoms persist. Plan: POCT urinalysis dipstick, POCT UA - Microscopic Only  Meds ordered this encounter  Medications  . DISCONTD: venlafaxine XR (EFFEXOR-XR) 37.5 MG 24 hr capsule    Sig: Take 1 capsule by mouth.  Marland Kitchen atorvastatin (LIPITOR) 20 MG tablet    Sig: Take 1 tablet (20 mg total) by mouth daily.    Dispense:  90 tablet    Refill:  1  . hydrochlorothiazide (HYDRODIURIL) 25 MG tablet    Sig: Take 1 tablet (25 mg total) by mouth daily.    Dispense:  90 tablet    Refill:  1  . metoprolol succinate (TOPROL-XL) 50 MG 24 hr tablet    Sig: Take 1 tablet (50 mg total) by mouth daily.    Dispense:  90 tablet    Refill:  1  . venlafaxine XR (EFFEXOR XR) 75 MG 24 hr capsule    Sig: Take 1 capsule daily with one 37.5 mg capsule.    Dispense:  30 capsule    Refill:  5  . venlafaxine XR (EFFEXOR-XR) 37.5 MG 24 hr capsule    Sig: Take 1 capsule daily with one 75 mg capsule.    Dispense:  30 capsule    Refill:  5

## 2013-07-06 DIAGNOSIS — F321 Major depressive disorder, single episode, moderate: Secondary | ICD-10-CM | POA: Diagnosis not present

## 2013-07-29 ENCOUNTER — Ambulatory Visit (INDEPENDENT_AMBULATORY_CARE_PROVIDER_SITE_OTHER): Payer: Medicare Other | Admitting: Family Medicine

## 2013-07-29 VITALS — BP 130/70 | HR 100 | Temp 98.0°F | Resp 16 | Ht 64.0 in | Wt 154.0 lb

## 2013-07-29 DIAGNOSIS — B353 Tinea pedis: Secondary | ICD-10-CM

## 2013-07-29 MED ORDER — KETOCONAZOLE 2 % EX CREA: 1.0000 "application " | TOPICAL_CREAM | Freq: Every day | CUTANEOUS | Status: DC

## 2013-07-29 NOTE — Patient Instructions (Signed)
Athlete's Foot Athlete's foot (tinea pedis) is a fungal infection of the skin on the feet. It often occurs on the skin between the toes or underneath the toes. It can also occur on the soles of the feet. Athlete's foot is more likely to occur in hot, humid weather. Not washing your feet or changing your socks often enough can contribute to athlete's foot. The infection can spread from person to person (contagious). CAUSES Athlete's foot is caused by a fungus. This fungus thrives in warm, moist places. Most people get athlete's foot by sharing shower stalls, towels, and wet floors with an infected person. People with weakened immune systems, including those with diabetes, may be more likely to get athlete's foot. SYMPTOMS   Itchy areas between the toes or on the soles of the feet.  White, flaky, or scaly areas between the toes or on the soles of the feet.  Tiny, intensely itchy blisters between the toes or on the soles of the feet.  Tiny cuts on the skin. These cuts can develop a bacterial infection.  Thick or discolored toenails. DIAGNOSIS  Your caregiver can usually tell what the problem is by doing a physical exam. Your caregiver may also take a skin sample from the rash area. The skin sample may be examined under a microscope, or it may be tested to see if fungus will grow in the sample. A sample may also be taken from your toenail for testing. TREATMENT  Over-the-counter and prescription medicines can be used to kill the fungus. These medicines are available as powders or creams. Your caregiver can suggest medicines for you. Fungal infections respond slowly to treatment. You may need to continue using your medicine for several weeks. PREVENTION   Do not share towels.  Wear sandals in wet areas, such as shared locker rooms and shared showers.  Keep your feet dry. Wear shoes that allow air to circulate. Wear cotton or wool socks. HOME CARE INSTRUCTIONS   Take medicines as directed by  your caregiver. Do not use steroid creams on athlete's foot.  Keep your feet clean and cool. Wash your feet daily and dry them thoroughly, especially between your toes.  Change your socks every day. Wear cotton or wool socks. In hot climates, you may need to change your socks 2 to 3 times per day.  Wear sandals or canvas tennis shoes with good air circulation.  If you have blisters, soak your feet in Burow's solution or Epsom salts for 20 to 30 minutes, 2 times a day to dry out the blisters. Make sure you dry your feet thoroughly afterward. SEEK MEDICAL CARE IF:   You have a fever.  You have swelling, soreness, warmth, or redness in your foot.  You are not getting better after 7 days of treatment.  You are not completely cured after 30 days.  You have any problems caused by your medicines. MAKE SURE YOU:   Understand these instructions.  Will watch your condition.  Will get help right away if you are not doing well or get worse. Document Released: 02/27/2000 Document Revised: 05/24/2011 Document Reviewed: 12/18/2010 [employer] Patient Information 2014 [employer].  

## 2013-07-29 NOTE — Progress Notes (Signed)
58 yo disabled woman with over one month  Of bilateral foot rash, creeping  Up side of feet with redness and scaling along with burning.  Objective:  Confluent lateral foot rash bilaterally with scaling  Assessment:  Tinea pedis  Tinea pedis - Plan: ketoconazole (NIZORAL) 2 % cream  Signed, Elvina SidleKurt Seyed Heffley, MD

## 2013-08-22 ENCOUNTER — Ambulatory Visit: Payer: Medicare Other | Admitting: Family Medicine

## 2013-11-27 ENCOUNTER — Other Ambulatory Visit: Payer: Self-pay | Admitting: Family Medicine

## 2013-12-12 ENCOUNTER — Other Ambulatory Visit: Payer: Self-pay | Admitting: Family Medicine

## 2014-01-11 ENCOUNTER — Other Ambulatory Visit: Payer: Self-pay | Admitting: Family Medicine

## 2014-01-14 ENCOUNTER — Other Ambulatory Visit: Payer: Self-pay | Admitting: Family Medicine

## 2014-01-14 DIAGNOSIS — F609 Personality disorder, unspecified: Secondary | ICD-10-CM | POA: Diagnosis not present

## 2014-01-14 DIAGNOSIS — F321 Major depressive disorder, single episode, moderate: Secondary | ICD-10-CM | POA: Diagnosis not present

## 2014-01-31 ENCOUNTER — Ambulatory Visit (INDEPENDENT_AMBULATORY_CARE_PROVIDER_SITE_OTHER): Payer: Medicare Other

## 2014-01-31 ENCOUNTER — Ambulatory Visit (INDEPENDENT_AMBULATORY_CARE_PROVIDER_SITE_OTHER): Payer: Medicare Other | Admitting: Family Medicine

## 2014-01-31 ENCOUNTER — Encounter: Payer: Self-pay | Admitting: Family Medicine

## 2014-01-31 VITALS — BP 146/88 | HR 96 | Temp 97.6°F | Resp 16 | Ht 64.5 in | Wt 165.2 lb

## 2014-01-31 DIAGNOSIS — R739 Hyperglycemia, unspecified: Secondary | ICD-10-CM

## 2014-01-31 DIAGNOSIS — F419 Anxiety disorder, unspecified: Secondary | ICD-10-CM | POA: Diagnosis not present

## 2014-01-31 DIAGNOSIS — Z23 Encounter for immunization: Secondary | ICD-10-CM

## 2014-01-31 DIAGNOSIS — R7309 Other abnormal glucose: Secondary | ICD-10-CM

## 2014-01-31 DIAGNOSIS — F329 Major depressive disorder, single episode, unspecified: Secondary | ICD-10-CM

## 2014-01-31 DIAGNOSIS — R05 Cough: Secondary | ICD-10-CM

## 2014-01-31 DIAGNOSIS — E7800 Pure hypercholesterolemia, unspecified: Secondary | ICD-10-CM

## 2014-01-31 DIAGNOSIS — Z720 Tobacco use: Secondary | ICD-10-CM

## 2014-01-31 DIAGNOSIS — R053 Chronic cough: Secondary | ICD-10-CM

## 2014-01-31 DIAGNOSIS — I10 Essential (primary) hypertension: Secondary | ICD-10-CM

## 2014-01-31 DIAGNOSIS — F32A Depression, unspecified: Secondary | ICD-10-CM

## 2014-01-31 LAB — POCT UA - MICROSCOPIC ONLY
CASTS, UR, LPF, POC: NEGATIVE
CRYSTALS, UR, HPF, POC: NEGATIVE
Mucus, UA: POSITIVE
Yeast, UA: NEGATIVE

## 2014-01-31 LAB — POCT URINALYSIS DIPSTICK
BILIRUBIN UA: NEGATIVE
Glucose, UA: NEGATIVE
Ketones, UA: NEGATIVE
Leukocytes, UA: NEGATIVE
NITRITE UA: NEGATIVE
Protein, UA: NEGATIVE
SPEC GRAV UA: 1.01
UROBILINOGEN UA: 0.2
pH, UA: 7

## 2014-01-31 LAB — THYROID PANEL WITH TSH
Free Thyroxine Index: 1.6 (ref 1.4–3.8)
T3 UPTAKE: 29 % (ref 22–35)
T4 TOTAL: 5.5 ug/dL (ref 4.5–12.0)
TSH: 0.851 u[IU]/mL (ref 0.350–4.500)

## 2014-01-31 LAB — COMPLETE METABOLIC PANEL WITH GFR
ALK PHOS: 99 U/L (ref 39–117)
ALT: 16 U/L (ref 0–35)
AST: 13 U/L (ref 0–37)
Albumin: 4 g/dL (ref 3.5–5.2)
BUN: 17 mg/dL (ref 6–23)
CO2: 23 mEq/L (ref 19–32)
Calcium: 9.5 mg/dL (ref 8.4–10.5)
Chloride: 100 mEq/L (ref 96–112)
Creat: 0.66 mg/dL (ref 0.50–1.10)
GFR, Est African American: >89 mL/min
Glucose, Bld: 92 mg/dL (ref 70–99)
Potassium: 4 mEq/L (ref 3.5–5.3)
Sodium: 136 mEq/L (ref 135–145)
Total Bilirubin: 0.3 mg/dL (ref 0.2–1.2)
Total Protein: 7.2 g/dL (ref 6.0–8.3)

## 2014-01-31 LAB — POCT GLYCOSYLATED HEMOGLOBIN (HGB A1C): HEMOGLOBIN A1C: 6.6

## 2014-01-31 LAB — LIPID PANEL
Cholesterol: 171 mg/dL (ref 0–200)
HDL: 39 mg/dL — ABNORMAL LOW (ref >39)
LDL CALC: 103 mg/dL — AB (ref 0–99)
TRIGLYCERIDES: 144 mg/dL (ref <150)
Total CHOL/HDL Ratio: 4.4 Ratio
VLDL: 29 mg/dL (ref 0–40)

## 2014-01-31 MED ORDER — HYDROCHLOROTHIAZIDE 25 MG PO TABS: 25.0000 mg | ORAL_TABLET | Freq: Every day | ORAL | Status: DC

## 2014-01-31 MED ORDER — ATORVASTATIN CALCIUM 20 MG PO TABS: 20.0000 mg | ORAL_TABLET | Freq: Every day | ORAL | Status: DC

## 2014-01-31 MED ORDER — METOPROLOL TARTRATE 25 MG PO TABS: 25.0000 mg | ORAL_TABLET | Freq: Two times a day (BID) | ORAL | Status: DC

## 2014-01-31 MED ORDER — FLUTICASONE-SALMETEROL 100-50 MCG/DOSE IN AEPB: 1.0000 | INHALATION_SPRAY | Freq: Two times a day (BID) | RESPIRATORY_TRACT | Status: DC

## 2014-01-31 MED ORDER — METFORMIN HCL 500 MG PO TABS: 500.0000 mg | ORAL_TABLET | Freq: Every day | ORAL | Status: DC

## 2014-01-31 MED ORDER — VENLAFAXINE HCL ER 37.5 MG PO CP24: ORAL_CAPSULE | ORAL | Status: DC

## 2014-01-31 MED ORDER — VENLAFAXINE HCL ER 75 MG PO CP24: ORAL_CAPSULE | ORAL | Status: DC

## 2014-01-31 NOTE — Progress Notes (Signed)
Subjective:    Patient ID: Gwendolyn Pham, female    DOB: March 22, 1955, 58 y.o.   MRN: [id number] Patient Active Problem List   Diagnosis Date Noted  . HTN, goal below 140/90 02/22/2013  . GERD (gastroesophageal reflux disease) 02/22/2013  . Tobacco user 06/15/2012  . Hypercholesteremia 04/16/2012  . Anxiety 04/04/2012  . Depression 04/04/2012   Prior to Admission medications   Medication Sig Start Date End Date Taking? Authorizing Provider  aspirin EC 81 MG tablet Take 1 tablet (81 mg total) by mouth daily. 04/04/12  Yes Henderson Cloud, MD  atorvastatin (LIPITOR) 20 MG tablet Take 1 tablet (20 mg total) by mouth daily. NO MORE REFILLS WITHOUT OFFICE VISIT - 2ND NOTICE 01/11/14  Yes Maurice March, MD  hydrochlorothiazide (HYDRODIURIL) 25 MG tablet Take 1 tablet (25 mg total) by mouth daily. NO MORE REFILLS WITHOUT OFFICE VISIT - 2ND NOTICE 01/11/14  Yes Maurice March, MD  ketoconazole (NIZORAL) 2 % cream Apply 1 application topically daily. 07/29/13  Yes Elvina Sidle, MD  Multiple Vitamin (MULTIVITAMIN) tablet Take 1 tablet by mouth daily.   Yes Historical Provider, MD  venlafaxine XR (EFFEXOR XR) 75 MG 24 hr capsule Take 1 capsule daily with one 37.5 mg capsule. 05/15/13  Yes Maurice March, MD  venlafaxine XR (EFFEXOR-XR) 37.5 MG 24 hr capsule Take 1 capsule daily with one 75 mg capsule. 05/15/13  Yes Maurice March, MD  metoprolol succinate (TOPROL-XL) 50 MG 24 hr tablet Take 1 tablet (50 mg total) by mouth daily. NO MORE REFILLS WITHOUT OFFICE VISIT - 2ND NOTICE 01/11/14   Maurice March, MD   Allergies  Allergen Reactions  . Lisinopril Cough    Muscle pain   . Motrin [Ibuprofen]   . Penicillins Rash   HPI  This is a 58 year old female presenting for follow up of HTN, HLD, depression and anxiety.  Depression: She reports she has been much more depressed lately. She is on 115.5 mg effexor. She reports she is having family stress and recently a  good friend died. She finds that she has decreased motivation. She loves riding horses but lately has not felt motivated to do this even though she knows it makes her feel good. She is tired of feeling this way. She has made an appt.with a PA at [facility] for 02/11/14. She has also made an appt. To see a counselor in December - she has seen this counselor before and likes him.  Tobacco use: she has increased her tobacco use to at least 1 ppd. She wants to quit but stress makes her smoke more. She does not think that she has tried wellbutrin in the past. She notes she has started coughing daily in the past year. It is often productive in the mornings. She notes that she is unable to walk as far as she used to. She is also endorsing a strange gurgling/rattling noise in her chest at times.  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 Usually runs 140s/90s.  Last hgb a1c 02/2013 - 6.3. Last lipid 03/2012 - LDL 197, total 268. At this time statin therapy was initiated. No repeat lipids since.  Review of Systems  Constitutional: Negative.   Eyes: Negative.   Respiratory: Positive for cough.   Cardiovascular: Negative for chest pain and leg swelling.  Gastrointestinal: Negative for nausea, vomiting, abdominal pain and diarrhea.  Endocrine: Negative.   Genitourinary: Negative.   Musculoskeletal: Positive for  back pain.  Skin: Negative.   Allergic/Immunologic: Negative for environmental allergies.  Psychiatric/Behavioral: Positive for dysphoric mood.      Objective:   Physical Exam  Constitutional: She is oriented to person, place, and time. She appears well-developed and well-nourished. No distress.  HENT:  Head: Normocephalic and atraumatic.  Right Ear: Hearing, external ear and ear canal normal. Tympanic membrane is retracted.  Left Ear: Hearing, external ear and ear canal normal. Tympanic membrane is retracted.  Nose: Nose normal.  Mouth/Throat: Uvula is midline,  oropharynx is clear and moist and mucous membranes are normal.  Eyes: Conjunctivae, EOM and lids are normal. Pupils are equal, round, and reactive to light. Right eye exhibits no discharge. Left eye exhibits no discharge. No scleral icterus.  Cardiovascular: Normal rate, regular rhythm, normal heart sounds, intact distal pulses and normal pulses.   No murmur heard. Pulmonary/Chest: Effort normal and breath sounds normal. No respiratory distress. She has no wheezes. She has no rhonchi. She has no rales.  Abdominal: Soft. Normal appearance. There is tenderness in the suprapubic area.  Musculoskeletal: Normal range of motion.  Lymphadenopathy:       Head (right side): No submental, no submandibular, no tonsillar, no preauricular, no posterior auricular and no occipital adenopathy present.       Head (left side): No submental, no submandibular, no tonsillar, no preauricular, no posterior auricular and no occipital adenopathy present.    She has no cervical adenopathy.  Neurological: She is alert and oriented to person, place, and time.  Skin: Skin is warm, dry and intact. No lesion and no rash noted.  Psychiatric: Her speech is normal and behavior is normal. She exhibits a depressed mood (often tearful).  Often inattentive and thought content scattered   BP 146/88 mmHg  Pulse 96  Temp(Src) 97.6 F (36.4 C) (Oral)  Resp 16  Ht 5' 4.5" (1.638 m)  Wt 165 lb 3.2 oz (74.934 kg)  BMI 27.93 kg/m2  SpO2 93%  Results for orders placed or performed in visit on 01/31/14  POCT glycosylated hemoglobin (Hb A1C)  Result Value Ref Range   Hemoglobin A1C 6.6   POCT urinalysis dipstick  Result Value Ref Range   Color, UA yellow    Clarity, UA clear    Glucose, UA neg    Bilirubin, UA neg    Ketones, UA neg    Spec Grav, UA 1.010    Blood, UA trace    pH, UA 7.0    Protein, UA neg    Urobilinogen, UA 0.2    Nitrite, UA neg    Leukocytes, UA Negative    [facility] reading (PRIMARY) by  Dr. Audria NineMcPherson:  Increased interstitial markings, otherwise negative.      Assessment & Plan:  1. HTN, goal below 140/90  - COMPLETE METABOLIC PANEL WITH GFR - POCT UA - Microscopic Only  2. Anxiety 3. Depression Depression is not well controlled with effexor. We will not make any changes at this time. She will follow up with psychiatry PA on 02/11/14 and will be starting therapy in December.  - Thyroid Panel With TSH   4. Hypercholesteremia - Lipid panel  5. Tobacco user She wants to quit. We discussed cutting back slowly before she sees psychiatry. Will inquire about wellbutrin at that time. CXR today negative except for interstitial markings. Gave handout on smoking cessation program. - DG Chest 2 View; Future - POCT urinalysis dipstick  6. Need for prophylactic vaccination and inoculation against influenza - Flu Vaccine  QUAD 36+ mos IM  7. Hyperglycemia A1C today 6.6 increased from 6.3. Will add metformin 500 mg and she will take with biggest meal of the day. - POCT glycosylated hemoglobin (Hb A1C)  8. Chronic cough Likely had mild COPD. Will add advair twice daily. She was encouraged to quit smoking. She will be talking with psychiatry about this.    F/U 6 weeks.  Roswell MinersNicole V. Dyke BrackettBush, PA-C, MHS [facility]  01/31/2014

## 2014-01-31 NOTE — Patient Instructions (Signed)
Continue current medications. Your A1c has gone up to 6.6%; this means you have Diabetes. You are being prescribed Metformin 500 mg 1 tablet daily after lunch; this will help keep your blood sugar in the normal range.   Tobacco use- [facility] about [facility] Cessation [facility]. Limit yourself to not more than 1 pack per day until the end of November. Starting December, smoke no more than 15 cigarettes for 2 weeks then decrease to 10 cigarettes daily. Other strategies that you have discussed with Joni Reiningicole can be helpful as you commit to quitting.  [facility] and follow through with the counselor.  I will see you again in 4-6 weeks.   Diabetes Mellitus and Food It is important for you to manage your blood sugar (glucose) level. Your blood glucose level can be greatly affected by what you eat. Eating healthier foods in the appropriate amounts throughout the day at about the same time each day will help you control your blood glucose level. It can also help slow or prevent worsening of your diabetes mellitus. Healthy eating may even help you improve the level of your blood pressure and reach or maintain a healthy weight.  HOW CAN FOOD AFFECT ME? Carbohydrates Carbohydrates affect your blood glucose level more than any other type of food. Your dietitian will help you determine how many carbohydrates to eat at each meal and teach you how to count carbohydrates. Counting carbohydrates is important to keep your blood glucose at a healthy level, especially if you are using insulin or taking certain medicines for diabetes mellitus. Alcohol Alcohol can cause sudden decreases in blood glucose (hypoglycemia), especially if you use insulin or take certain medicines for diabetes mellitus. Hypoglycemia can be a life-threatening condition. Symptoms of hypoglycemia (sleepiness, dizziness, and disorientation) are similar to symptoms of having too much alcohol.  If your health  care provider has given you approval to drink alcohol, do so in moderation and use the following guidelines:  Women should not have more than one drink per day, and men should not have more than two drinks per day. One drink is equal to:  12 oz of beer.  5 oz of wine.  1 oz of hard liquor.  Do not drink on an empty stomach.  Keep yourself hydrated. Have water, diet soda, or unsweetened iced tea.  Regular soda, juice, and other mixers might contain a lot of carbohydrates and should be counted. WHAT FOODS ARE NOT RECOMMENDED? As you make food choices, it is important to remember that all foods are not the same. Some foods have fewer nutrients per serving than other foods, even though they might have the same number of calories or carbohydrates. It is difficult to get your body what it needs when you eat foods with fewer nutrients. Examples of foods that you should avoid that are high in calories and carbohydrates but low in nutrients include:  Trans fats (most processed foods list trans fats on the Nutrition Facts label).  Regular soda.  Juice.  Candy.  Sweets, such as cake, pie, doughnuts, and cookies.  Fried foods. WHAT FOODS CAN I EAT? Have nutrient-rich foods, which will nourish your body and keep you healthy. The food you should eat also will depend on several factors, including:  The calories you need.  The medicines you take.  Your weight.  Your blood glucose level.  Your blood pressure level.  Your cholesterol level. You also should eat a variety of foods, including:  Protein, such as meat, poultry, fish, tofu, nuts, and seeds (lean animal proteins are best).  Fruits.  Vegetables.  Dairy products, such as milk, cheese, and yogurt (low fat is best).  Breads, grains, pasta, cereal, rice, and beans.  Fats such as olive oil, trans fat-free margarine, canola oil, avocado, and olives. DOES EVERYONE WITH DIABETES MELLITUS HAVE THE SAME MEAL PLAN? Because every  person with diabetes mellitus is different, there is not one meal plan that works for everyone. It is very important that you meet with a dietitian who will help you create a meal plan that is just right for you. Document Released: 11/26/2004 Document Revised: 03/06/2013 Document Reviewed: 01/26/2013 [facility] Patient Information 2015 [employer] [place]. This information is not intended to replace advice given to you by your health care provider. Make sure you discuss any questions you have with your health care provider.

## 2014-02-03 ENCOUNTER — Encounter: Payer: Self-pay | Admitting: Family Medicine

## 2014-02-03 NOTE — Progress Notes (Signed)
Quick Note:  Please advise pt regarding following labs...  Recent lab results look very good. Chemistry panel - blood sugar, salts, kidney and liver function tests are normal. Thyroid function is normal. Total cholesterol and triglycerides are normal. HDL ("good") cholesterol is too low. Overall, the lipid panel has improved.   Contact the clinic if you have questions or concerns.  Copy to pt. ______

## 2014-02-03 NOTE — Progress Notes (Signed)
This 58 y.o. Cauc female has chronic depression and presents w/ increased stressors. Current venlafaxine dose= 112.5 mg which has been effective according to pt. Current medical issues outlined in documentation by Lanier ClamNicole Bush, PA-C.   HPI, examination and A/P as documented by PA-C. I have reviewed and discussed w/ PA-C.  The effects of smoking are evident on her skin which has a dry and leathery texture; she has pre-mature wrinkling. Mentation/ psych- pt's thinking appears scattered at times but speech is not pressured or rapid; she is not delusional or inappropriate. She exhibits good insight into problems.  PHQ-9 score= 11.  I agree with assessment and plan.  Pt will follow-up w/ me in 6 weeks.  Maurice MarchBarbara B. Leeta Grimme, MD [facility]

## 2014-02-06 ENCOUNTER — Other Ambulatory Visit: Payer: Self-pay | Admitting: Physician Assistant

## 2014-02-11 DIAGNOSIS — F609 Personality disorder, unspecified: Secondary | ICD-10-CM | POA: Diagnosis not present

## 2014-03-11 DIAGNOSIS — F609 Personality disorder, unspecified: Secondary | ICD-10-CM | POA: Diagnosis not present

## 2014-03-21 ENCOUNTER — Ambulatory Visit: Payer: Medicare Other | Admitting: Family Medicine

## 2014-06-02 ENCOUNTER — Other Ambulatory Visit: Payer: Self-pay | Admitting: Physician Assistant

## 2014-07-02 DIAGNOSIS — F609 Personality disorder, unspecified: Secondary | ICD-10-CM | POA: Diagnosis not present

## 2014-11-08 DIAGNOSIS — F609 Personality disorder, unspecified: Secondary | ICD-10-CM | POA: Diagnosis not present

## 2014-12-28 ENCOUNTER — Other Ambulatory Visit: Payer: Self-pay | Admitting: Physician Assistant

## 2015-02-05 ENCOUNTER — Other Ambulatory Visit: Payer: Self-pay | Admitting: Physician Assistant

## 2015-02-11 DIAGNOSIS — F609 Personality disorder, unspecified: Secondary | ICD-10-CM | POA: Diagnosis not present

## 2015-05-28 DIAGNOSIS — F609 Personality disorder, unspecified: Secondary | ICD-10-CM | POA: Diagnosis not present

## 2015-06-18 ENCOUNTER — Ambulatory Visit (INDEPENDENT_AMBULATORY_CARE_PROVIDER_SITE_OTHER): Payer: Medicare Other | Admitting: Family Medicine

## 2015-06-18 VITALS — BP 146/86 | HR 80 | Temp 97.7°F | Resp 16 | Ht 64.0 in | Wt 143.8 lb

## 2015-06-18 DIAGNOSIS — R0789 Other chest pain: Secondary | ICD-10-CM | POA: Diagnosis not present

## 2015-06-18 DIAGNOSIS — E7800 Pure hypercholesterolemia, unspecified: Secondary | ICD-10-CM

## 2015-06-18 DIAGNOSIS — K219 Gastro-esophageal reflux disease without esophagitis: Secondary | ICD-10-CM

## 2015-06-18 DIAGNOSIS — F419 Anxiety disorder, unspecified: Secondary | ICD-10-CM | POA: Diagnosis not present

## 2015-06-18 DIAGNOSIS — F329 Major depressive disorder, single episode, unspecified: Secondary | ICD-10-CM | POA: Diagnosis not present

## 2015-06-18 DIAGNOSIS — E119 Type 2 diabetes mellitus without complications: Secondary | ICD-10-CM

## 2015-06-18 DIAGNOSIS — Z1159 Encounter for screening for other viral diseases: Secondary | ICD-10-CM | POA: Diagnosis not present

## 2015-06-18 DIAGNOSIS — F32A Depression, unspecified: Secondary | ICD-10-CM

## 2015-06-18 DIAGNOSIS — I10 Essential (primary) hypertension: Secondary | ICD-10-CM

## 2015-06-18 DIAGNOSIS — Z720 Tobacco use: Secondary | ICD-10-CM

## 2015-06-18 LAB — POCT CBC
Granulocyte percent: 64 %G (ref 37–80)
HCT, POC: 41.3 % (ref 37.7–47.9)
HEMOGLOBIN: 14.8 g/dL (ref 12.2–16.2)
LYMPH, POC: 2.5 (ref 0.6–3.4)
MCH, POC: 32.6 pg — AB (ref 27–31.2)
MCHC: 35.9 g/dL — AB (ref 31.8–35.4)
MCV: 90.7 fL (ref 80–97)
MID (cbc): 0.3 (ref 0–0.9)
MPV: 7 fL (ref 0–99.8)
PLATELET COUNT, POC: 195 10*3/uL (ref 142–424)
POC GRANULOCYTE: 5.1 (ref 2–6.9)
POC LYMPH %: 31.7 % (ref 10–50)
POC MID %: 4.3 %M (ref 0–12)
RBC: 4.55 M/uL (ref 4.04–5.48)
RDW, POC: 15.1 %
WBC: 8 10*3/uL (ref 4.6–10.2)

## 2015-06-18 LAB — POCT URINALYSIS DIP (MANUAL ENTRY)
BILIRUBIN UA: NEGATIVE
GLUCOSE UA: NEGATIVE
Ketones, POC UA: NEGATIVE
Leukocytes, UA: NEGATIVE
NITRITE UA: NEGATIVE
PH UA: 5.5
Protein Ur, POC: NEGATIVE
SPEC GRAV UA: 1.01
UROBILINOGEN UA: 0.2

## 2015-06-18 LAB — POCT GLYCOSYLATED HEMOGLOBIN (HGB A1C): HEMOGLOBIN A1C: 6.4

## 2015-06-18 LAB — GLUCOSE, POCT (MANUAL RESULT ENTRY): POC GLUCOSE: 150 mg/dL — AB (ref 70–99)

## 2015-06-18 MED ORDER — METOPROLOL TARTRATE 25 MG PO TABS: ORAL_TABLET | ORAL | Status: DC

## 2015-06-18 MED ORDER — METFORMIN HCL 500 MG PO TABS: ORAL_TABLET | ORAL | Status: DC

## 2015-06-18 MED ORDER — FLUTICASONE-SALMETEROL 100-50 MCG/DOSE IN AEPB: INHALATION_SPRAY | RESPIRATORY_TRACT | Status: DC

## 2015-06-18 MED ORDER — HYDROCHLOROTHIAZIDE 25 MG PO TABS: ORAL_TABLET | ORAL | Status: DC

## 2015-06-18 MED ORDER — ATORVASTATIN CALCIUM 20 MG PO TABS: ORAL_TABLET | ORAL | Status: DC

## 2015-06-18 NOTE — Progress Notes (Signed)
Subjective:    Patient ID: Gwendolyn Pham, female    DOB: 1955-03-28, 60 y.o.   MRN: [id number]  06/18/2015  Medication Refill   HPI This 60 y.o. female presents for eighteen month follow-up:  1.  DMII: Patient reports POOR compliance with medication, good tolerance to medication, and good symptom control.   Metformin 500mg  daily.  Not checking sugars. Does not have a meter.    2.  HTN: Patient reports POOR compliance with medication, good tolerance to medication, and good symptom control.  HTCZ and Metoprolol for HTN.   3.  Hyperlipidemia: Patient reports POOR compliance with medication, good tolerance to medication, and good symptom control.   Atorvastatin 20mg  daily.  4.  Anxiety and depression: Patient reports good compliance with medication, good tolerance to medication, and good symptom control.    5.  COPD/tobacco abuse: Advair bid.  Wants to quit smoking.  6.  Anxiety and depression: followed by psychiatry; every 3 months.   Off medication for a while; not feeling well.  Has been out of medication for quite a while.  +dizziness; urinating frequently;    Review of Systems  Constitutional: Positive for diaphoresis. Negative for fever, chills and fatigue.  Eyes: Negative for visual disturbance.  Respiratory: Negative for cough and shortness of breath.   Cardiovascular: Negative for chest pain, palpitations and leg swelling.  Gastrointestinal: Negative for nausea, vomiting, abdominal pain, diarrhea and constipation.  Endocrine: Negative for cold intolerance, heat intolerance, polydipsia, polyphagia and polyuria.  Genitourinary: Positive for frequency. Negative for dysuria, urgency, hematuria, flank pain and vaginal discharge.  Neurological: Positive for dizziness. Negative for tremors, seizures, syncope, facial asymmetry, speech difficulty, weakness, light-headedness, numbness and headaches.    Past Medical History  Diagnosis Date  . Anxiety   . Asthma   . Depression   .  Hypertension   . Hyperlipidemia   . Diabetes mellitus without complication [facility](HCC)    Past Surgical History  Procedure Laterality Date  . Inner ear surgery     Allergies  Allergen Reactions  . Lisinopril Cough    Muscle pain   . Motrin [Ibuprofen]   . Penicillins Rash   Current Outpatient Prescriptions  Medication Sig Dispense Refill  . aspirin EC 81 MG tablet Take 1 tablet (81 mg total) by mouth daily.    Marland Kitchen. atorvastatin (LIPITOR) 20 MG tablet TAKE 1 TABLET EVERY DAY 90 tablet 1  . Fluticasone-Salmeterol (ADVAIR DISKUS) 100-50 MCG/DOSE AEPB INHALE 1 PUFF INTO THE LUNGS 2 (TWO) TIMES DAILY. 60 each 5  . hydrochlorothiazide (HYDRODIURIL) 25 MG tablet TAKE 1 TABLET EVERY DAY 90 tablet 1  . metFORMIN (GLUCOPHAGE) 500 MG tablet TAKE 1 TABLET (500 MG TOTAL) BY MOUTH DAILY WITH LUNCH 90 tablet 1  . metoprolol tartrate (LOPRESSOR) 25 MG tablet TAKE 1 TABLET TWICE A DAY 180 tablet 1  . Multiple Vitamin (MULTIVITAMIN) tablet Take 1 tablet by mouth daily.    Marland Kitchen. venlafaxine XR (EFFEXOR XR) 75 MG 24 hr capsule Take 1 capsule daily with one 37.5 mg capsule. 30 capsule 6  . venlafaxine XR (EFFEXOR-XR) 37.5 MG 24 hr capsule Take 1 capsule daily with one 75 mg capsule. 30 capsule 6  . ketoconazole (NIZORAL) 2 % cream Apply 1 application topically daily. (Patient not taking: Reported on 06/18/2015) 60 g 0   No current facility-administered medications for this visit.   Social History   Social History  . Marital Status: Single    Spouse Name: N/A  . Number of Children:  N/A  . Years of Education: N/A   Occupational History  . Not on file.   Social History Main Topics  . Smoking status: Current Every Day Smoker  . Smokeless tobacco: Never Used     Comment: 1/2 pack per day  . Alcohol Use: No  . Drug Use: No  . Sexual Activity: Not Currently   Other Topics Concern  . Not on file   Social History Narrative   Marital status: single; not dating      Children: none      Lives: alone; 3  animals      Employment:  Disability      Tobacco: 1 ppd      Alcohol: none      Drugs: none      Exercise: none   Family History  Problem Relation Age of Onset  . Hypertension Mother   . Diabetes Mother   . COPD Father   . Heart disease Father 55    AMI x 3/CABG  . Diabetes Father   . Diabetes Sister   . Mental illness Sister     schizoaffective  . Diabetes Sister   . Arthritis Sister        Objective:    BP 146/86 mmHg  Pulse 80  Temp(Src) 97.7 F (36.5 C) (Oral)  Resp 16  Ht  (1.626 m)  Wt 143 lb 12.8 oz (65.227 kg)  BMI 24.67 kg/m2  SpO2 97% Physical Exam  Constitutional: She is oriented to person, place, and time. She appears well-developed and well-nourished. No distress.  HENT:  Head: Normocephalic and atraumatic.  Right Ear: External ear normal.  Left Ear: External ear normal.  Nose: Nose normal.  Mouth/Throat: Oropharynx is clear and moist.  Eyes: Conjunctivae and EOM are normal. Pupils are equal, round, and reactive to light.  Neck: Normal range of motion. Neck supple. Carotid bruit is not present. No thyromegaly present.  Cardiovascular: Normal rate, regular rhythm, normal heart sounds and intact distal pulses.  Exam reveals no gallop and no friction rub.   No murmur heard. Pulmonary/Chest: Effort normal and breath sounds normal. She has no wheezes. She has no rales.  Abdominal: Soft. Bowel sounds are normal. She exhibits no distension and no mass. There is no tenderness. There is no rebound and no guarding.  Lymphadenopathy:    She has no cervical adenopathy.  Neurological: She is alert and oriented to person, place, and time. No cranial nerve deficit.  Skin: Skin is warm and dry. No rash noted. She is not diaphoretic. No erythema. No pallor.  Psychiatric: She has a normal mood and affect. Her behavior is normal.        Assessment & Plan:   1. Type 2 diabetes mellitus without complication, without long-term current use of insulin (HCC)   2.  HTN, goal below 140/90   3. Gastroesophageal reflux disease without esophagitis   4. Anxiety   5. Hypercholesteremia   6. Tobacco user   7. Depression   8. Need for hepatitis C screening test   9. Other chest pain    -Noncompliance with medications and follow-up. -obtain labs. -refills provided. -RTC three to six  months.   Orders Placed This Encounter  Procedures  . Comprehensive metabolic panel    Order Specific Question:  Has the patient fasted?    Answer:  Yes  . TSH  . Microalbumin, urine  . Hepatitis C antibody  . Lipid panel    Order Specific Question:  Has the patient fasted?    Answer:  Yes  . Ambulatory referral to Cardiology    Referral Priority:  Routine    Referral Type:  Consultation    Referral Reason:  Specialty Services Required    Requested Specialty:  Cardiology    Number of Visits Requested:  1  . POCT CBC  . POCT glucose (manual entry)  . POCT glycosylated hemoglobin (Hb A1C)  . POCT urinalysis dipstick  . EKG 12-Lead   Meds ordered this encounter  Medications  . metoprolol tartrate (LOPRESSOR) 25 MG tablet    Sig: TAKE 1 TABLET TWICE A DAY    Dispense:  180 tablet    Refill:  1  . metFORMIN (GLUCOPHAGE) 500 MG tablet    Sig: TAKE 1 TABLET (500 MG TOTAL) BY MOUTH DAILY WITH LUNCH    Dispense:  90 tablet    Refill:  1  . hydrochlorothiazide (HYDRODIURIL) 25 MG tablet    Sig: TAKE 1 TABLET EVERY DAY    Dispense:  90 tablet    Refill:  1  . atorvastatin (LIPITOR) 20 MG tablet    Sig: TAKE 1 TABLET EVERY DAY    Dispense:  90 tablet    Refill:  1  . Fluticasone-Salmeterol (ADVAIR DISKUS) 100-50 MCG/DOSE AEPB    Sig: INHALE 1 PUFF INTO THE LUNGS 2 (TWO) TIMES DAILY.    Dispense:  60 each    Refill:  5    Return in about 6 months (around 12/18/2015) for recheck high blood pressure, diabetes, high cholesterol.    Kristi Paulita Fujita, M.D. [facility] [address] [place], [place]  [address] [phone]  phone [phone] fax

## 2015-06-18 NOTE — Patient Instructions (Signed)
Present to Emergency Room/call 911 if you suffer with recurrent chest pain.

## 2015-06-19 ENCOUNTER — Ambulatory Visit: Payer: Medicare Other | Admitting: Family Medicine

## 2015-06-19 LAB — COMPREHENSIVE METABOLIC PANEL
ALBUMIN: 3.9 g/dL (ref 3.6–5.1)
ALT: 12 U/L (ref 6–29)
AST: 11 U/L (ref 10–35)
Alkaline Phosphatase: 84 U/L (ref 33–130)
BILIRUBIN TOTAL: 0.2 mg/dL (ref 0.2–1.2)
BUN: 12 mg/dL (ref 7–25)
CHLORIDE: 103 mmol/L (ref 98–110)
CO2: 24 mmol/L (ref 20–31)
Calcium: 9.2 mg/dL (ref 8.6–10.4)
Creat: 0.66 mg/dL (ref 0.50–0.99)
GLUCOSE: 141 mg/dL — AB (ref 65–99)
POTASSIUM: 4.5 mmol/L (ref 3.5–5.3)
Sodium: 138 mmol/L (ref 135–146)
Total Protein: 7.2 g/dL (ref 6.1–8.1)

## 2015-06-19 LAB — HEPATITIS C ANTIBODY: HCV AB: NEGATIVE

## 2015-06-19 LAB — LIPID PANEL
CHOL/HDL RATIO: 5.5 ratio — AB (ref <=5.0)
CHOLESTEROL: 215 mg/dL — AB (ref 125–200)
HDL: 39 mg/dL — AB (ref >=46)
LDL Cholesterol: 148 mg/dL — ABNORMAL HIGH (ref <130)
Triglycerides: 138 mg/dL (ref <150)
VLDL: 28 mg/dL (ref <30)

## 2015-06-19 LAB — MICROALBUMIN, URINE: MICROALB UR: 0.2 mg/dL

## 2015-06-19 LAB — TSH: TSH: 1.25 mIU/L

## 2015-06-26 ENCOUNTER — Encounter: Payer: Self-pay | Admitting: Family Medicine

## 2015-10-22 DIAGNOSIS — F609 Personality disorder, unspecified: Secondary | ICD-10-CM | POA: Diagnosis not present

## 2015-12-23 ENCOUNTER — Ambulatory Visit: Payer: Medicare Other | Admitting: Family Medicine

## 2015-12-28 ENCOUNTER — Other Ambulatory Visit: Payer: Self-pay | Admitting: Family Medicine

## 2015-12-31 DIAGNOSIS — F609 Personality disorder, unspecified: Secondary | ICD-10-CM | POA: Diagnosis not present

## 2016-03-15 LAB — HM DIABETES EYE EXAM

## 2016-03-23 DIAGNOSIS — F609 Personality disorder, unspecified: Secondary | ICD-10-CM | POA: Diagnosis not present

## 2016-04-06 ENCOUNTER — Other Ambulatory Visit: Payer: Self-pay | Admitting: Family Medicine

## 2016-06-04 ENCOUNTER — Other Ambulatory Visit: Payer: Self-pay | Admitting: Family Medicine

## 2016-06-04 ENCOUNTER — Ambulatory Visit (INDEPENDENT_AMBULATORY_CARE_PROVIDER_SITE_OTHER): Payer: Medicare Other | Admitting: Physician Assistant

## 2016-06-04 VITALS — BP 128/74 | HR 93 | Temp 98.1°F | Resp 16 | Ht 64.0 in | Wt 142.2 lb

## 2016-06-04 DIAGNOSIS — Z720 Tobacco use: Secondary | ICD-10-CM

## 2016-06-04 DIAGNOSIS — I10 Essential (primary) hypertension: Secondary | ICD-10-CM | POA: Diagnosis not present

## 2016-06-04 DIAGNOSIS — E785 Hyperlipidemia, unspecified: Secondary | ICD-10-CM | POA: Diagnosis not present

## 2016-06-04 DIAGNOSIS — E119 Type 2 diabetes mellitus without complications: Secondary | ICD-10-CM

## 2016-06-04 MED ORDER — METOPROLOL TARTRATE 25 MG PO TABS: 25.0000 mg | ORAL_TABLET | Freq: Two times a day (BID) | ORAL | 1 refills | Status: DC

## 2016-06-04 MED ORDER — HYDROCHLOROTHIAZIDE 25 MG PO TABS: 25.0000 mg | ORAL_TABLET | Freq: Every day | ORAL | 1 refills | Status: DC

## 2016-06-04 MED ORDER — FLUTICASONE-SALMETEROL 100-50 MCG/DOSE IN AEPB: INHALATION_SPRAY | RESPIRATORY_TRACT | 5 refills | Status: DC

## 2016-06-04 MED ORDER — ATORVASTATIN CALCIUM 20 MG PO TABS: 20.0000 mg | ORAL_TABLET | Freq: Every day | ORAL | 3 refills | Status: DC

## 2016-06-04 MED ORDER — METFORMIN HCL 500 MG PO TABS: 500.0000 mg | ORAL_TABLET | Freq: Every day | ORAL | 1 refills | Status: DC

## 2016-06-04 NOTE — Progress Notes (Signed)
Gwendolyn Pham  MRN: [id number] DOB: 11-13-1955  Subjective:  Gwendolyn Pham is a 61 y.o. female seen in office today for a chief complaint of medication refill for multiple medications. She was last seen in 12/2015 for follow up and saw Dr. Tamala Pham, who is her PCP.  1) HTN: Diagnosis was made in 2015. Currently managed HCTZ 89m and lopressor 580mdaily. Checks bp outside of the office. Range is 130s-140s. Notes it will get more elevated if she is around toxic family members. Has intermittent headaches. Denies chest pain, palpitations, visual disturbance, nausea, and vomiting.   2) HLD: Currently managed with atorvastatin 2048mShe is avoiding most red meat, she will eat chicken instead but is mostly eating fresh produce. Drinks mostly water and coffee.  3) T2DM: Diagnosis made in roughly 2015. Currently managed with metformin 500m50mily. She does not check her sugars. Feels as if her diabetes is well controlled at this time. She has no known complications from T2DMA0OK4) COPD/Tobacco abuse: Uses advair daily. Does not recall having diagnosis of COPD. Has not had formal PFTs that she knows of. Smokes 1/2 ppd x 20 + plus.   Review of Systems  Constitutional: Negative for chills, diaphoresis and fever.  Respiratory: Positive for cough (baseline for patient) and shortness of breath ( baseline for patient). Negative for chest tightness and wheezing.   Cardiovascular: Negative for leg swelling.     Patient Active Problem List   Diagnosis Date Noted  . Diabetes (HCC)[place]/07/2015  . HTN, goal below 140/90 02/22/2013  . GERD (gastroesophageal reflux disease) 02/22/2013  . Tobacco user 06/15/2012  . Hypercholesteremia 04/16/2012  . Anxiety 04/04/2012  . Depression 04/04/2012    Current Outpatient Prescriptions on File Prior to Visit  Medication Sig Dispense Refill  . aspirin EC 81 MG tablet Take 1 tablet (81 mg total) by mouth daily.    . Multiple Vitamin (MULTIVITAMIN) tablet Take 1 tablet  by mouth daily.    . veMarland Kitchenlafaxine XR (EFFEXOR XR) 75 MG 24 hr capsule Take 1 capsule daily with one 37.5 mg capsule. 30 capsule 6  . ketoconazole (NIZORAL) 2 % cream Apply 1 application topically daily. (Patient not taking: Reported on 06/04/2016) 60 g 0   No current facility-administered medications on file prior to visit.     Allergies  Allergen Reactions  . Lisinopril Cough    Muscle pain   . Motrin [Ibuprofen]   . Penicillins Rash   Social History   Social History  . Marital status: Single    Spouse name: N/A  . Number of children: N/A  . Years of education: N/A   Occupational History  . Not on file.   Social History Main Topics  . Smoking status: Current Every Day Smoker  . Smokeless tobacco: Never Used     Comment: 1/2 pack per day  . Alcohol use No  . Drug use: No  . Sexual activity: Not Currently   Other Topics Concern  . Not on file   Social History Narrative   Marital status: single; not dating      Children: none      Lives: alone; 3 animals      Employment:  Disability      Tobacco: 1 ppd      Alcohol: none      Drugs: none      Exercise: none       Objective:  BP 128/74 (BP Location: Right Arm, Patient Position: Sitting,  Cuff Size: Small)   Pulse 93   Temp 98.1 F (36.7 C) (Oral)   Resp 16   Ht 5' 4"  (1.626 m)   Wt 142 lb 3.2 oz (64.5 kg)   SpO2 96%   BMI 24.41 kg/m   Physical Exam  Constitutional: She is oriented to person, place, and time and well-developed, well-nourished, and in no distress.  HENT:  Head: Normocephalic and atraumatic.  Mouth/Throat: Mucous membranes are not dry.  Eyes: Conjunctivae are normal.  Neck: Normal range of motion.  Cardiovascular: Normal rate, regular rhythm and normal heart sounds.   Pulmonary/Chest: Effort normal. She has wheezes (few intermittent wheezes auscultated throughtout lung field ). She has no rhonchi. She has no rales.  Course breath sounds auscultated bilaterally.   Musculoskeletal:        Right lower leg: She exhibits no swelling.       Left lower leg: She exhibits no swelling.  Neurological: She is alert and oriented to person, place, and time. Gait normal.  Skin: Skin is warm and dry.  Vitals reviewed.  BP Readings from Last 3 Encounters:  06/04/16 128/74  06/18/15 (!) 146/86  01/31/14 (!) 146/88    Assessment and Plan :  Today is my first day meeting this patient.  1. Type 2 diabetes mellitus without complication, without long-term current use of insulin ([place]) Await lab results. Instructed that she needs to follow up with Dr. Tamala Pham in 3 months for proper diabetes follow up visit. Pt agrees to do so.  - Hemoglobin A1c - metFORMIN (GLUCOPHAGE) 500 MG tablet; Take 1 tablet (500 mg total) by mouth daily with breakfast.  Dispense: 90 tablet; Refill: 1  2. HTN, goal below 140/90 Well controlled in office today. Continue medications as prescribed. Will need to follow up for HTN in 6 months.  - CMP14+EGFR - hydrochlorothiazide (HYDRODIURIL) 25 MG tablet; Take 1 tablet (25 mg total) by mouth daily.  Dispense: 90 tablet; Refill: 1 - metoprolol tartrate (LOPRESSOR) 25 MG tablet; Take 1 tablet (25 mg total) by mouth 2 (two) times daily.  Dispense: 180 tablet; Refill: 1  3. Tobacco user -Pt seemed extremely shocked when I asked her about her diagnosis of "COPD." She states that if she has COPD, she will definitely quit smoking. We briefly discussed smoking cessation, she notes she would like to try it without medication. I encouraged that she should consider having PFT's at a future office visit. Informed her that if her chronic cough and SOB start to worsen, return to clinic for further evaluation.  - Fluticasone-Salmeterol (ADVAIR DISKUS) 100-50 MCG/DOSE AEPB; INHALE 1 PUFF INTO THE LUNGS 2 (TWO) TIMES DAILY.  Dispense: 60 each; Refill: 5  4. Hyperlipidemia, unspecified hyperlipidemia type - Lipid panel - atorvastatin (LIPITOR) 20 MG tablet; Take 1 tablet (20 mg total) by mouth  daily.  Dispense: 90 tablet; Refill: [place] PA-C  [facility] 06/04/2016 9:50 PM

## 2016-06-04 NOTE — Patient Instructions (Addendum)
I would like you to follow up with Dr. Katrinka Blazing in 3 months for a proper diabetes follow up and PFT testing. In the meantime really work on quitting smoking. If you develop any worsening cough with production or shortness of breath, please come back sooner. We will contact you with your lab results next week. Thank you for letting me participate in your health and well being.     Steps to Quit Smoking Smoking tobacco can be bad for your health. It can also affect almost every organ in your body. Smoking puts you and people around you at risk for many serious long-lasting (chronic) diseases. Quitting smoking is hard, but it is one of the best things that you can do for your health. It is never too late to quit. What are the benefits of quitting smoking? When you quit smoking, you lower your risk for getting serious diseases and conditions. They can include:  Lung cancer or lung disease.  Heart disease.  Stroke.  Heart attack.  Not being able to have children (infertility).  Weak bones (osteoporosis) and broken bones (fractures). If you have coughing, wheezing, and shortness of breath, those symptoms may get better when you quit. You may also get sick less often. If you are pregnant, quitting smoking can help to lower your chances of having a baby of low birth weight. What can I do to help me quit smoking? Talk with your doctor about what can help you quit smoking. Some things you can do (strategies) include:  Quitting smoking totally, instead of slowly cutting back how much you smoke over a period of time.  Going to in-person counseling. You are more likely to quit if you go to many counseling sessions.  Using resources and support systems, such as:  Online chats with a Veterinary surgeon.  Phone quitlines.  Printed Materials engineer.  Support groups or group counseling.  Text messaging programs.  Mobile phone apps or applications.  Taking medicines. Some of these medicines may have  nicotine in them. If you are pregnant or breastfeeding, do not take any medicines to quit smoking unless your doctor says it is okay. Talk with your doctor about counseling or other things that can help you. Talk with your doctor about using more than one strategy at the same time, such as taking medicines while you are also going to in-person counseling. This can help make quitting easier. What things can I do to make it easier to quit? Quitting smoking might feel very hard at first, but there is a lot that you can do to make it easier. Take these steps:  Talk to your family and friends. Ask them to support and encourage you.  Call phone quitlines, reach out to support groups, or work with a Veterinary surgeon.  Ask people who smoke to not smoke around you.  Avoid places that make you want (trigger) to smoke, such as:  Bars.  Parties.  Smoke-break areas at work.  Spend time with people who do not smoke.  Lower the stress in your life. Stress can make you want to smoke. Try these things to help your stress:  Getting regular exercise.  Deep-breathing exercises.  Yoga.  Meditating.  Doing a body scan. To do this, close your eyes, focus on one area of your body at a time from head to toe, and notice which parts of your body are tense. Try to relax the muscles in those areas.  Download or buy apps on your mobile phone or tablet  that can help you stick to your quit plan. There are many free apps, such as QuitGuide from the [employer] developer for Disease Control and Prevention). You can find more support from smokefree.gov and other websites. This information is not intended to replace advice given to you by your health care provider. Make sure you discuss any questions you have with your health care provider. Document Released: 12/26/2008 Document Revised: 10/28/2015 Document Reviewed: 07/16/2014 Elsevier Interactive Patient Education  2017 [employer].    IF you received an x-ray today, you  will receive an invoice from [facility]. Please contact [facility] at [phone] with questions or concerns regarding your invoice.   IF you received labwork today, you will receive an invoice from [facility]. Please contact [facility] at [phone] with questions or concerns regarding your invoice.   Our billing staff will not be able to assist you with questions regarding bills from these companies.  You will be contacted with the lab results as soon as they are available. The fastest way to get your results is to activate your My Chart account. Instructions are located on the last page of this paperwork. If you have not heard from [place] regarding the results in 2 weeks, please contact this office.

## 2016-06-05 ENCOUNTER — Ambulatory Visit: Payer: Medicare Other

## 2016-06-05 LAB — CMP14+EGFR
A/G RATIO: 1.4 (ref 1.2–2.2)
ALT: 16 IU/L (ref 0–32)
AST: 12 IU/L (ref 0–40)
Albumin: 4.3 g/dL (ref 3.6–4.8)
Alkaline Phosphatase: 103 IU/L (ref 39–117)
BUN/Creatinine Ratio: 29 — ABNORMAL HIGH (ref 12–28)
BUN: 18 mg/dL (ref 8–27)
Bilirubin Total: <0.2 mg/dL (ref 0.0–1.2)
CALCIUM: 9.5 mg/dL (ref 8.7–10.3)
CHLORIDE: 102 mmol/L (ref 96–106)
CO2: 26 mmol/L (ref 18–29)
Creatinine, Ser: 0.62 mg/dL (ref 0.57–1.00)
GFR, EST AFRICAN AMERICAN: 113 mL/min/{1.73_m2} (ref >59)
GFR, EST NON AFRICAN AMERICAN: 98 mL/min/{1.73_m2} (ref >59)
GLOBULIN, TOTAL: 3.1 g/dL (ref 1.5–4.5)
Glucose: 92 mg/dL (ref 65–99)
POTASSIUM: 5.1 mmol/L (ref 3.5–5.2)
SODIUM: 141 mmol/L (ref 134–144)
Total Protein: 7.4 g/dL (ref 6.0–8.5)

## 2016-06-05 LAB — LIPID PANEL
CHOL/HDL RATIO: 4.2 ratio (ref 0.0–4.4)
Cholesterol, Total: 199 mg/dL (ref 100–199)
HDL: 47 mg/dL (ref >39)
LDL Calculated: 113 mg/dL — ABNORMAL HIGH (ref 0–99)
TRIGLYCERIDES: 196 mg/dL — AB (ref 0–149)
VLDL Cholesterol Cal: 39 mg/dL (ref 5–40)

## 2016-06-05 LAB — HEMOGLOBIN A1C
Est. average glucose Bld gHb Est-mCnc: 137 mg/dL
HEMOGLOBIN A1C: 6.4 % — AB (ref 4.8–5.6)

## 2016-06-15 DIAGNOSIS — F609 Personality disorder, unspecified: Secondary | ICD-10-CM | POA: Diagnosis not present

## 2016-08-25 DIAGNOSIS — F609 Personality disorder, unspecified: Secondary | ICD-10-CM | POA: Diagnosis not present

## 2016-12-01 ENCOUNTER — Other Ambulatory Visit: Payer: Self-pay | Admitting: Physician Assistant

## 2016-12-01 DIAGNOSIS — I10 Essential (primary) hypertension: Secondary | ICD-10-CM

## 2016-12-01 DIAGNOSIS — E119 Type 2 diabetes mellitus without complications: Secondary | ICD-10-CM

## 2016-12-15 DIAGNOSIS — F609 Personality disorder, unspecified: Secondary | ICD-10-CM | POA: Diagnosis not present

## 2016-12-16 ENCOUNTER — Telehealth: Payer: Self-pay

## 2016-12-16 NOTE — Telephone Encounter (Signed)
Called patient and reviewed overdue health maintenance.  Postponed the ones she declined, and reminded her of the rest.  She states she has an appointment next week with Dr. Katrinka Blazing and will discuss with her the immunizations. She was very happy to have gotten a call and said she always enjoyed our staff as very kind and attentive to her needs.

## 2016-12-30 ENCOUNTER — Telehealth: Payer: Self-pay

## 2016-12-30 NOTE — Telephone Encounter (Signed)
Called pt to schedule initial Medicare Annual Wellness Visit. -nr

## 2017-01-05 ENCOUNTER — Ambulatory Visit: Payer: Medicare Other | Admitting: Family Medicine

## 2017-01-06 NOTE — Telephone Encounter (Signed)
Second telephone outreach to patient. Patient is scheduled on 01/10/17 for office visit. Patient could come in early for AWV at 9:20 and then see provider after if pt is available.    Gwendolyn PoeNicole Mylia Pham, B.A.  Care Guide - [facility] [phone]

## 2017-01-10 ENCOUNTER — Ambulatory Visit: Payer: Medicare Other | Admitting: Family Medicine

## 2017-01-16 ENCOUNTER — Other Ambulatory Visit: Payer: Self-pay | Admitting: Physician Assistant

## 2017-01-16 DIAGNOSIS — Z720 Tobacco use: Secondary | ICD-10-CM

## 2017-01-29 ENCOUNTER — Other Ambulatory Visit: Payer: Self-pay | Admitting: Family Medicine

## 2017-01-29 DIAGNOSIS — I10 Essential (primary) hypertension: Secondary | ICD-10-CM

## 2017-01-29 DIAGNOSIS — E119 Type 2 diabetes mellitus without complications: Secondary | ICD-10-CM

## 2017-01-31 ENCOUNTER — Encounter: Payer: Self-pay | Admitting: Family Medicine

## 2017-01-31 ENCOUNTER — Ambulatory Visit (INDEPENDENT_AMBULATORY_CARE_PROVIDER_SITE_OTHER): Payer: Medicare Other | Admitting: Family Medicine

## 2017-01-31 ENCOUNTER — Other Ambulatory Visit: Payer: Self-pay

## 2017-01-31 VITALS — BP 134/82 | HR 92 | Temp 98.4°F | Resp 16 | Ht 64.57 in | Wt 137.0 lb

## 2017-01-31 DIAGNOSIS — Z0000 Encounter for general adult medical examination without abnormal findings: Secondary | ICD-10-CM | POA: Diagnosis not present

## 2017-01-31 DIAGNOSIS — Z720 Tobacco use: Secondary | ICD-10-CM

## 2017-01-31 DIAGNOSIS — Z114 Encounter for screening for human immunodeficiency virus [HIV]: Secondary | ICD-10-CM | POA: Diagnosis not present

## 2017-01-31 DIAGNOSIS — E7800 Pure hypercholesterolemia, unspecified: Secondary | ICD-10-CM | POA: Diagnosis not present

## 2017-01-31 DIAGNOSIS — E119 Type 2 diabetes mellitus without complications: Secondary | ICD-10-CM

## 2017-01-31 DIAGNOSIS — Z1211 Encounter for screening for malignant neoplasm of colon: Secondary | ICD-10-CM

## 2017-01-31 DIAGNOSIS — Z23 Encounter for immunization: Secondary | ICD-10-CM | POA: Diagnosis not present

## 2017-01-31 DIAGNOSIS — I10 Essential (primary) hypertension: Secondary | ICD-10-CM

## 2017-01-31 DIAGNOSIS — F419 Anxiety disorder, unspecified: Secondary | ICD-10-CM | POA: Diagnosis not present

## 2017-01-31 LAB — POCT URINALYSIS DIP (MANUAL ENTRY)
Glucose, UA: NEGATIVE mg/dL
Leukocytes, UA: NEGATIVE
NITRITE UA: NEGATIVE
PH UA: 6.5 (ref 5.0–8.0)
Protein Ur, POC: NEGATIVE mg/dL
Spec Grav, UA: 1.015 (ref 1.010–1.025)
Urobilinogen, UA: 0.2 E.U./dL

## 2017-01-31 MED ORDER — ZOSTER VAC RECOMB ADJUVANTED 50 MCG/0.5ML IM SUSR: 0.5000 mL | Freq: Once | INTRAMUSCULAR | 1 refills | Status: AC

## 2017-01-31 MED ORDER — FLUTICASONE-SALMETEROL 100-50 MCG/DOSE IN AEPB: INHALATION_SPRAY | RESPIRATORY_TRACT | 11 refills | Status: DC

## 2017-01-31 MED ORDER — METOPROLOL TARTRATE 25 MG PO TABS: 25.0000 mg | ORAL_TABLET | Freq: Two times a day (BID) | ORAL | 1 refills | Status: DC

## 2017-01-31 MED ORDER — METFORMIN HCL 500 MG PO TABS: ORAL_TABLET | ORAL | 1 refills | Status: DC

## 2017-01-31 MED ORDER — HYDROCHLOROTHIAZIDE 25 MG PO TABS: 25.0000 mg | ORAL_TABLET | Freq: Every day | ORAL | 1 refills | Status: DC

## 2017-01-31 NOTE — Progress Notes (Signed)
Subjective:    Patient ID: Gwendolyn Pham, female    DOB: 1955/10/30, 61 y.o.   MRN: [id number]  01/31/2017  Annual Exam (AWV); Diabetes; Hypertension; and Hyperlipidemia    HPI This 61 y.o. female presents for Annual Wellness Visit and follow-up of chronic medical conditions.  Last physical:  never Pap smear:  N/d; postmenopausal; REFUSES Mammogram:  N/d; REFUSES Colonoscopy:  N/d; REFUSES;  Bone density:  n/d Eye exam:  03/15/2016; GLASSES   BP Readings from Last 3 Encounters:  01/31/17 134/82  06/04/16 128/74  06/18/15 (!) 146/86   Wt Readings from Last 3 Encounters:  01/31/17 137 lb (62.1 kg)  06/04/16 142 lb 3.2 oz (64.5 kg)  06/18/15 143 lb 12.8 oz (65.2 kg)   Immunization History  Administered Date(s) Administered  . Influenza,inj,Quad PF,6+ Mos 01/31/2014, 01/31/2017  . Pneumococcal Polysaccharide-23 01/31/2017  . Tdap 02/13/2013   DMII: Patient reports good compliance with medication, good tolerance to medication, and good symptom control.  Does not check sugar; does not have glucometer.  HTN: Patient reports good compliance with medication, good tolerance to medication, and good symptom control.  [facility]cks [facility]; running 130s/80s.   Hypercholesterolemia: Patient reports good compliance with medication, good tolerance to medication, and good symptom control.    Asthma/COPD: Patient reports good compliance with medication, good tolerance to medication, and good symptom control.    Anxiety/Depression: [place] psychiatry [place].  No therapist right now.    Review of Systems  Constitutional: Negative for activity change, appetite change, chills, diaphoresis, fatigue, fever and unexpected weight change.  HENT: Negative for congestion, dental problem, drooling, ear discharge, ear pain, facial swelling, hearing loss, mouth sores, nosebleeds, postnasal drip, rhinorrhea, sinus pressure, sneezing, sore throat, tinnitus, trouble swallowing and voice change.     Eyes: Negative for photophobia, pain, discharge, redness, itching and visual disturbance.  Respiratory: Negative for apnea, cough, choking, chest tightness, shortness of breath, wheezing and stridor.   Cardiovascular: Negative for chest pain, palpitations and leg swelling.  Gastrointestinal: Negative for abdominal distention, abdominal pain, anal bleeding, blood in stool, constipation, diarrhea, nausea, rectal pain and vomiting.  Endocrine: Negative for cold intolerance, heat intolerance, polydipsia, polyphagia and polyuria.  Genitourinary: Negative for decreased urine volume, difficulty urinating, dyspareunia, dysuria, enuresis, flank pain, frequency, genital sores, hematuria, menstrual problem, pelvic pain, urgency, vaginal bleeding, vaginal discharge and vaginal pain.       Nocturia x 4-5.  Urinary leakage stress only with hard laughing.  Musculoskeletal: Negative for arthralgias, back pain, gait problem, joint swelling, myalgias, neck pain and neck stiffness.  Skin: Negative for color change, pallor, rash and wound.  Allergic/Immunologic: Negative for environmental allergies, food allergies and immunocompromised state.  Neurological: Negative for dizziness, tremors, seizures, syncope, facial asymmetry, speech difficulty, weakness, light-headedness, numbness and headaches.  Hematological: Negative for adenopathy. Does not bruise/bleed easily.  Psychiatric/Behavioral: Positive for dysphoric mood and sleep disturbance. Negative for agitation, behavioral problems, confusion, decreased concentration, hallucinations, self-injury and suicidal ideas. The patient is not nervous/anxious and is not hyperactive.        Bedtime 9pm-5am; wakes up 8am-2pm.    Past Medical History:  Diagnosis Date  . Anxiety   . Asthma   . Depression   . Diabetes mellitus without complication (HCC)   . Hyperlipidemia   . Hypertension    Past Surgical History:  Procedure Laterality Date  . DILATION AND CURETTAGE OF  UTERUS    . INNER EAR SURGERY     Allergies  Allergen Reactions  .  Lisinopril Cough    Muscle pain   . Motrin [Ibuprofen]   . Penicillins Rash   Current Outpatient Medications on File Prior to Visit  Medication Sig Dispense Refill  . aspirin EC 81 MG tablet Take 1 tablet (81 mg total) by mouth daily.    Marland Kitchen. atorvastatin (LIPITOR) 20 MG tablet Take 1 tablet (20 mg total) by mouth daily. 90 tablet 3  . Multiple Vitamin (MULTIVITAMIN) tablet Take 1 tablet by mouth daily.    Marland Kitchen. venlafaxine XR (EFFEXOR-XR) 150 MG 24 hr capsule Take 150 mg daily with breakfast by mouth.     No current facility-administered medications on file prior to visit.    Social History   Socioeconomic History  . Marital status: Single    Spouse name: Not on file  . Number of children: Not on file  . Years of education: Not on file  . Highest education level: Not on file  Social Needs  . Financial resource strain: Not on file  . Food insecurity - worry: Not on file  . Food insecurity - inability: Not on file  . Transportation needs - medical: Not on file  . Transportation needs - non-medical: Not on file  Occupational History  . Not on file  Tobacco Use  . Smoking status: Current Every Day Smoker  . Smokeless tobacco: Never Used  . Tobacco comment: 1/2 pack per day  Substance and Sexual Activity  . Alcohol use: No  . Drug use: No  . Sexual activity: Not Currently  Other Topics Concern  . Not on file  Social History Narrative   Marital status: single; not dating in 2018      Children: none      Lives: alone; 3 animals (cat, 2 dogs)      Employment:  Disability since 2004 due to depression/anxiety; followed by psychiatry.      Tobacco: 1 ppd x 30 years; quit once with patch      Alcohol: none      Drugs: none      Exercise: none in 2018      Seatbelt: 100%; no texting      ADLs: independent with ADLs; no assistant devices; drives      Advanced Directives:    Family History  Problem Relation Age  of Onset  . Hypertension Mother   . Diabetes Mother   . COPD Father   . Heart disease Father [id number]       AMI x 3/CABG  . Diabetes Father   . Diabetes Sister   . Mental illness Sister        schizoaffective  . Diabetes Sister   . Arthritis Sister        Objective:    BP 134/82   Pulse 92   Temp 98.4 F (36.9 C) (Oral)   Resp 16   Ht 5' 4.57" (1.64 m)   Wt 137 lb (62.1 kg)   SpO2 96%   BMI 23.10 kg/m  Physical Exam  Constitutional: She is oriented to person, place, and time. She appears well-developed and well-nourished. No distress.  HENT:  Head: Normocephalic and atraumatic.  Right Ear: External ear normal.  Left Ear: External ear normal.  Nose: Nose normal.  Mouth/Throat: Oropharynx is clear and moist.  Eyes: Conjunctivae and EOM are normal. Pupils are equal, round, and reactive to light.  Neck: Normal range of motion and full passive range of motion without pain. Neck supple. No JVD present. Carotid bruit is not present.  No thyromegaly present.  Cardiovascular: Normal rate, regular rhythm and normal heart sounds. Exam reveals no gallop and no friction rub.  No murmur heard. Pulmonary/Chest: Effort normal and breath sounds normal. She has no wheezes. She has no rales.  Abdominal: Soft. Bowel sounds are normal. She exhibits no distension and no mass. There is no tenderness. There is no rebound and no guarding.  Musculoskeletal:       Right shoulder: Normal.       Left shoulder: Normal.       Cervical back: Normal.  Lymphadenopathy:    She has no cervical adenopathy.  Neurological: She is alert and oriented to person, place, and time. She has normal reflexes. No cranial nerve deficit. She exhibits normal muscle tone. Coordination normal.  Skin: Skin is warm and dry. No rash noted. She is not diaphoretic. No erythema. No pallor.  Psychiatric: She has a normal mood and affect. Her behavior is normal. Judgment and thought content normal.  Nursing note and vitals  reviewed.  No results found. Depression screen [facility] 2/9 01/31/2017 06/04/2016 06/18/2015 01/31/2014  Decreased Interest 0 0 0 1  Down, Depressed, Hopeless 0 3 0 1  PHQ - 2 Score 0 3 0 2  Altered sleeping - 3 - 1  Tired, decreased energy - 1 - 3  Change in appetite - 0 - 1  Feeling bad or failure about yourself  - 0 - 2  Trouble concentrating - 0 - 2  Moving slowly or fidgety/restless - 0 - 0  Suicidal thoughts - 0 - 0  PHQ-9 Score - 7 - 11  Difficult doing work/chores - Not difficult at all - -   Fall Risk  01/31/2017 01/31/2017 06/04/2016 06/18/2015 01/31/2014  Falls in the past year? Yes Yes No No Yes  Number falls in past yr: 1 1 - - 2 or more  Injury with Fall? No - - - -    Functional Status Survey: Is the patient deaf or have difficulty hearing?: No Does the patient have difficulty seeing, even when wearing glasses/contacts?: No Does the patient have difficulty concentrating, remembering, or making decisions?: No Does the patient have difficulty walking or climbing stairs?: No Does the patient have difficulty dressing or bathing?: No Does the patient have difficulty doing errands alone such as visiting a doctor's office or shopping?: No     Assessment & Plan:   1. Encounter for Medicare annual wellness exam   2. Type 2 diabetes mellitus without complication, without long-term current use of insulin (HCC)   3. HTN, goal below 140/90   4. Hypercholesteremia   5. Tobacco user   6. Anxiety   7. Screening for HIV (human immunodeficiency virus)   8. Need for prophylactic vaccination against Streptococcus pneumoniae (pneumococcus)   9. Need for prophylactic vaccination and inoculation against influenza   10. Colon cancer screening   11. Need for shingles vaccine     -anticipatory guidance provided --- exercise, weight loss, safe driving practices, aspirin 81mg  daily. -obtain age appropriate screening labs and labs for chronic disease management. -moderate fall risk; no  evidence of depression; no evidence of hearing loss.  Discussed advanced directives and living will; also discussed end of life issues including code status.  -refills provided on all medications; no adjustments made today. - I recommend weight loss, exercise, and low-carbohydrate low-sugar food choices. You should AVOID: regular sodas, sweetened tea, fruit juices.  You should LIMIT: breads, pastas, rice, potatoes, and desserts/sweets.  I would recommend limiting  your total carbohydrate intake per meal to 45 grams; I would limit your total carbohydrate intake per snack to 30 grams.  I would also have a goal of 60 grams of protein intake per day; this would equal 10-15 grams of protein per meal and 5-10 grams of protein per snack. -patient refuses pap smears/mammograms/colonoscopies.  Agreeable to immunizations.   Orders Placed This Encounter  Procedures  . Flu Vaccine QUAD 36+ mos IM  . Pneumococcal polysaccharide vaccine 23-valent greater than or equal to 2yo subcutaneous/IM  . CBC with Differential/Platelet  . Comprehensive metabolic panel    Order Specific Question:   Has the patient fasted?    Answer:   No  . Hemoglobin A1c  . Lipid panel    Order Specific Question:   Has the patient fasted?    Answer:   No  . TSH  . Microalbumin / creatinine urine ratio  . HIV antibody  . Cologuard  . POCT urinalysis dipstick   Meds ordered this encounter  Medications  . venlafaxine XR (EFFEXOR-XR) 150 MG 24 hr capsule    Sig: Take 150 mg daily with breakfast by mouth.  . Fluticasone-Salmeterol (ADVAIR DISKUS) 100-50 MCG/DOSE AEPB    Sig: INHALE 1 PUFF INTO THE LUNGS 2 (TWO) TIMES DAILY.    Dispense:  60 each    Refill:  11  . hydrochlorothiazide (HYDRODIURIL) 25 MG tablet    Sig: Take 1 tablet (25 mg total) daily by mouth.    Dispense:  90 tablet    Refill:  1  . metFORMIN (GLUCOPHAGE) 500 MG tablet    Sig: TAKE 1 TABLET BY MOUTH EVERY DAY WITH BREAKFAST    Dispense:  90 tablet    Refill:   1  . metoprolol tartrate (LOPRESSOR) 25 MG tablet    Sig: Take 1 tablet (25 mg total) 2 (two) times daily by mouth.    Dispense:  180 tablet    Refill:  1  . Zoster Vaccine Adjuvanted [facility]) injection    Sig: Inject 0.5 mLs once for 1 dose into the muscle.    Dispense:  0.5 mL    Refill:  1    Return in about 6 months (around 07/31/2017) for follow-up chronic medical conditions.   Damita Eppard Paulita Fujita, M.D. Primary Care at [facility] [address] [place], [place]  [address] [phone] phone [phone] fax

## 2017-01-31 NOTE — Telephone Encounter (Signed)
Pt not seen in office in over 6 months; does pt need appt before refills can be completed?

## 2017-01-31 NOTE — Telephone Encounter (Signed)
Refill request. Appointment today.

## 2017-01-31 NOTE — Patient Instructions (Addendum)
IF you received an x-ray today, you will receive an invoice from [facility]. Please contact [facility] at [phone] with questions or concerns regarding your invoice.   IF you received labwork today, you will receive an invoice from [facility]. Please contact [facility] at [phone] with questions or concerns regarding your invoice.   Our billing staff will not be able to assist you with questions regarding bills from these companies.  You will be contacted with the lab results as soon as they are available. The fastest way to get your results is to activate your My Chart account. Instructions are located on the last page of this paperwork. If you have not heard from [place] regarding the results in 2 weeks, please contact this office.      Preventive Care 40-64 Years, Female Preventive care refers to lifestyle choices and visits with your health care provider that can promote health and wellness. What does preventive care include?  A yearly physical exam. This is also called an annual well check.  Dental exams once or twice a year.  Routine eye exams. Ask your health care provider how often you should have your eyes checked.  Personal lifestyle choices, including: ? Daily care of your teeth and gums. ? Regular physical activity. ? Eating a healthy diet. ? Avoiding tobacco and drug use. ? Limiting alcohol use. ? Practicing safe sex. ? Taking low-dose aspirin daily starting at age 34. ? Taking vitamin and mineral supplements as recommended by your health care provider. What happens during an annual well check? The services and screenings done by your health care provider during your annual well check will depend on your age, overall health, lifestyle risk factors, and family history of disease. Counseling Your health care provider may ask you questions about your:  Alcohol use.  Tobacco use.  Drug use.  Emotional well-being.  Home and relationship  well-being.  Sexual activity.  Eating habits.  Work and work Statistician.  Method of birth control.  Menstrual cycle.  Pregnancy history.  Screening You may have the following tests or measurements:  Height, weight, and BMI.  Blood pressure.  Lipid and cholesterol levels. These may be checked every 5 years, or more frequently if you are over 65 years old.  Skin check.  Lung cancer screening. You may have this screening every year starting at age 102 if you have a 30-pack-year history of smoking and currently smoke or have quit within the past 15 years.  Fecal occult blood test (FOBT) of the stool. You may have this test every year starting at age 33.  Flexible sigmoidoscopy or colonoscopy. You may have a sigmoidoscopy every 5 years or a colonoscopy every 10 years starting at age 23.  Hepatitis C blood test.  Hepatitis B blood test.  Sexually transmitted disease (STD) testing.  Diabetes screening. This is done by checking your blood sugar (glucose) after you have not eaten for a while (fasting). You may have this done every 1-3 years.  Mammogram. This may be done every 1-2 years. Talk to your health care provider about when you should start having regular mammograms. This may depend on whether you have a family history of breast cancer.  BRCA-related cancer screening. This may be done if you have a family history of breast, ovarian, tubal, or peritoneal cancers.  Pelvic exam and Pap test. This may be done every 3 years starting at age 3. Starting at age 55, this may be done every 5 years if  you have a Pap test in combination with an HPV test.  Bone density scan. This is done to screen for osteoporosis. You may have this scan if you are at high risk for osteoporosis.  Discuss your test results, treatment options, and if necessary, the need for more tests with your health care provider. Vaccines Your health care provider may recommend certain vaccines, such  as:  Influenza vaccine. This is recommended every year.  Tetanus, diphtheria, and acellular pertussis (Tdap, Td) vaccine. You may need a Td booster every 10 years.  Varicella vaccine. You may need this if you have not been vaccinated.  Zoster vaccine. You may need this after age 70.  Measles, mumps, and rubella (MMR) vaccine. You may need at least one dose of MMR if you were born in 1957 or later. You may also need a second dose.  Pneumococcal 13-valent conjugate (PCV13) vaccine. You may need this if you have certain conditions and were not previously vaccinated.  Pneumococcal polysaccharide (PPSV23) vaccine. You may need one or two doses if you smoke cigarettes or if you have certain conditions.  Meningococcal vaccine. You may need this if you have certain conditions.  Hepatitis A vaccine. You may need this if you have certain conditions or if you travel or work in places where you may be exposed to hepatitis A.  Hepatitis B vaccine. You may need this if you have certain conditions or if you travel or work in places where you may be exposed to hepatitis B.  Haemophilus influenzae type b (Hib) vaccine. You may need this if you have certain conditions.  Talk to your health care provider about which screenings and vaccines you need and how often you need them. This information is not intended to replace advice given to you by your health care provider. Make sure you discuss any questions you have with your health care provider. Document Released: 03/28/2015 Document Revised: 11/19/2015 Document Reviewed: 12/31/2014 Elsevier Interactive Patient Education  2017 [employer].

## 2017-02-01 LAB — MICROALBUMIN / CREATININE URINE RATIO
Creatinine, Urine: 166.5 mg/dL
MICROALB/CREAT RATIO: 8.4 mg/g{creat} (ref 0.0–30.0)
MICROALBUM., U, RANDOM: 14 ug/mL

## 2017-02-01 LAB — CBC WITH DIFFERENTIAL/PLATELET
BASOS ABS: 0 10*3/uL (ref 0.0–0.2)
Basos: 0 %
EOS (ABSOLUTE): 0.1 10*3/uL (ref 0.0–0.4)
EOS: 1 %
HEMATOCRIT: 46.9 % — AB (ref 34.0–46.6)
Hemoglobin: 15.9 g/dL (ref 11.1–15.9)
IMMATURE GRANULOCYTES: 0 %
Immature Grans (Abs): 0 10*3/uL (ref 0.0–0.1)
LYMPHS ABS: 2.5 10*3/uL (ref 0.7–3.1)
Lymphs: 27 %
MCH: 32.1 pg (ref 26.6–33.0)
MCHC: 33.9 g/dL (ref 31.5–35.7)
MCV: 95 fL (ref 79–97)
MONOCYTES: 4 %
MONOS ABS: 0.4 10*3/uL (ref 0.1–0.9)
NEUTROS PCT: 68 %
Neutrophils Absolute: 6.3 10*3/uL (ref 1.4–7.0)
PLATELETS: 262 10*3/uL (ref 150–379)
RBC: 4.95 x10E6/uL (ref 3.77–5.28)
RDW: 14.5 % (ref 12.3–15.4)
WBC: 9.3 10*3/uL (ref 3.4–10.8)

## 2017-02-01 LAB — COMPREHENSIVE METABOLIC PANEL
ALBUMIN: 4.6 g/dL (ref 3.6–4.8)
ALK PHOS: 101 IU/L (ref 39–117)
ALT: 14 IU/L (ref 0–32)
AST: 11 IU/L (ref 0–40)
Albumin/Globulin Ratio: 1.6 (ref 1.2–2.2)
BILIRUBIN TOTAL: 0.2 mg/dL (ref 0.0–1.2)
BUN / CREAT RATIO: 11 — AB (ref 12–28)
BUN: 8 mg/dL (ref 8–27)
CHLORIDE: 97 mmol/L (ref 96–106)
CO2: 20 mmol/L (ref 20–29)
Calcium: 9.7 mg/dL (ref 8.7–10.3)
Creatinine, Ser: 0.74 mg/dL (ref 0.57–1.00)
GFR calc Af Amer: 101 mL/min/{1.73_m2} (ref >59)
GFR calc non Af Amer: 88 mL/min/{1.73_m2} (ref >59)
GLUCOSE: 108 mg/dL — AB (ref 65–99)
Globulin, Total: 2.9 g/dL (ref 1.5–4.5)
Potassium: 3.7 mmol/L (ref 3.5–5.2)
Sodium: 138 mmol/L (ref 134–144)
Total Protein: 7.5 g/dL (ref 6.0–8.5)

## 2017-02-01 LAB — LIPID PANEL
CHOL/HDL RATIO: 3.6 ratio (ref 0.0–4.4)
Cholesterol, Total: 182 mg/dL (ref 100–199)
HDL: 50 mg/dL (ref >39)
LDL Calculated: 107 mg/dL — ABNORMAL HIGH (ref 0–99)
TRIGLYCERIDES: 124 mg/dL (ref 0–149)
VLDL Cholesterol Cal: 25 mg/dL (ref 5–40)

## 2017-02-01 LAB — HEMOGLOBIN A1C
Est. average glucose Bld gHb Est-mCnc: 143 mg/dL
HEMOGLOBIN A1C: 6.6 % — AB (ref 4.8–5.6)

## 2017-02-01 LAB — TSH: TSH: 2.81 u[IU]/mL (ref 0.450–4.500)

## 2017-02-01 LAB — HIV ANTIBODY (ROUTINE TESTING W REFLEX): HIV Screen 4th Generation wRfx: NONREACTIVE

## 2017-06-03 ENCOUNTER — Other Ambulatory Visit: Payer: Self-pay | Admitting: Physician Assistant

## 2017-06-03 DIAGNOSIS — E785 Hyperlipidemia, unspecified: Secondary | ICD-10-CM

## 2017-07-06 DIAGNOSIS — F609 Personality disorder, unspecified: Secondary | ICD-10-CM | POA: Diagnosis not present

## 2017-07-22 ENCOUNTER — Ambulatory Visit: Payer: Self-pay | Admitting: *Deleted

## 2017-07-22 NOTE — Telephone Encounter (Signed)
Patient called to report that she took double dosing of Tylenol Arthritis 650 mg/ 8 tablets. Patient wanted to talk about her pain in her back,legs and body and the reason she was using the tylenol- she states when she realized she was taking too much Tylenol- she did call the pharmacy and talk to them. She was advised to call PCP. I did discuss different treatments for natural/ medical for pain. Patient is presently out of town. She is not having any adverse symptoms at this time. I scheduled her an appointment for Monday with Dr Katrinka Blazing to discuss the reasons for pian and options. She may need lab work- depending on her exposure to tylenol. She was in a hurry to get off the phone - she was on her sister's phone- so triage for Tylenol use was cut short- I did tell her to push her fluids. She is not going to using any more Tylenol until instructed to do so. Reason for Disposition . MORE THAN A DOUBLE DOSE of a prescription or over-the-counter (OTC) drug    Patient has contacted the pharmacy and talked to them. They told her to stop medication and call PCP.  Protocols used: MEDICATION QUESTION CALL-A-AH

## 2017-07-25 ENCOUNTER — Ambulatory Visit: Payer: Self-pay | Admitting: Family Medicine

## 2017-08-03 ENCOUNTER — Other Ambulatory Visit: Payer: Self-pay

## 2017-08-03 ENCOUNTER — Encounter: Payer: Self-pay | Admitting: Family Medicine

## 2017-08-03 ENCOUNTER — Ambulatory Visit (INDEPENDENT_AMBULATORY_CARE_PROVIDER_SITE_OTHER): Payer: Medicare Other | Admitting: Family Medicine

## 2017-08-03 ENCOUNTER — Ambulatory Visit: Payer: Self-pay | Admitting: *Deleted

## 2017-08-03 ENCOUNTER — Ambulatory Visit (INDEPENDENT_AMBULATORY_CARE_PROVIDER_SITE_OTHER): Payer: Medicare Other

## 2017-08-03 VITALS — BP 118/70 | HR 76 | Temp 98.0°F | Resp 16 | Ht 64.57 in

## 2017-08-03 DIAGNOSIS — R7989 Other specified abnormal findings of blood chemistry: Secondary | ICD-10-CM

## 2017-08-03 DIAGNOSIS — Z720 Tobacco use: Secondary | ICD-10-CM

## 2017-08-03 DIAGNOSIS — M25561 Pain in right knee: Secondary | ICD-10-CM

## 2017-08-03 DIAGNOSIS — E7800 Pure hypercholesterolemia, unspecified: Secondary | ICD-10-CM | POA: Diagnosis not present

## 2017-08-03 DIAGNOSIS — M25562 Pain in left knee: Secondary | ICD-10-CM

## 2017-08-03 DIAGNOSIS — I10 Essential (primary) hypertension: Secondary | ICD-10-CM

## 2017-08-03 DIAGNOSIS — E119 Type 2 diabetes mellitus without complications: Secondary | ICD-10-CM | POA: Diagnosis not present

## 2017-08-03 DIAGNOSIS — M545 Low back pain, unspecified: Secondary | ICD-10-CM

## 2017-08-03 DIAGNOSIS — J4540 Moderate persistent asthma, uncomplicated: Secondary | ICD-10-CM

## 2017-08-03 DIAGNOSIS — R768 Other specified abnormal immunological findings in serum: Secondary | ICD-10-CM

## 2017-08-03 DIAGNOSIS — F419 Anxiety disorder, unspecified: Secondary | ICD-10-CM | POA: Diagnosis not present

## 2017-08-03 DIAGNOSIS — K219 Gastro-esophageal reflux disease without esophagitis: Secondary | ICD-10-CM

## 2017-08-03 MED ORDER — HYDROCHLOROTHIAZIDE 25 MG PO TABS
25.0000 mg | ORAL_TABLET | Freq: Every day | ORAL | 1 refills | Status: DC
Start: 2017-08-03 — End: 2017-12-20

## 2017-08-03 MED ORDER — METOPROLOL TARTRATE 25 MG PO TABS: 25.0000 mg | ORAL_TABLET | Freq: Two times a day (BID) | ORAL | 1 refills | Status: DC

## 2017-08-03 MED ORDER — METFORMIN HCL 500 MG PO TABS: ORAL_TABLET | ORAL | 1 refills | Status: DC

## 2017-08-03 NOTE — Patient Instructions (Addendum)
   IF you received an x-ray today, you will receive an invoice from [facility]. Please contact [facility] at [phone] with questions or concerns regarding your invoice.   IF you received labwork today, you will receive an invoice from [facility]. Please contact [facility] at [phone] with questions or concerns regarding your invoice.   Our billing staff will not be able to assist you with questions regarding bills from these companies.  You will be contacted with the lab results as soon as they are available. The fastest way to get your results is to activate your My Chart account. Instructions are located on the last page of this paperwork. If you have not heard from us regarding the results in 2 weeks, please contact this office.      Low Back Sprain Rehab Ask your health care provider which exercises are safe for you. Do exercises exactly as told by your health care provider and adjust them as directed. It is normal to feel mild stretching, pulling, tightness, or discomfort as you do these exercises, but you should stop right away if you feel sudden pain or your pain gets worse. Do not begin these exercises until told by your health care provider. Stretching and range of motion exercises These exercises warm up your muscles and joints and improve the movement and flexibility of your back. These exercises also help to relieve pain, numbness, and tingling. Exercise A: Lumbar rotation  1. Lie on your back on a firm surface and bend your knees. 2. Straighten your arms out to your sides so each arm forms an "L" shape with a side of your body (a 90 degree angle). 3. Slowly move both of your knees to one side of your body until you feel a stretch in your lower back. Try not to let your shoulders move off of the floor. 4. Hold for __________ seconds. 5. Tense your abdominal muscles and slowly move your knees back to the starting position. 6. Repeat this exercise on the  other side of your body. Repeat __________ times. Complete this exercise __________ times a day. Exercise B: Prone extension on elbows  1. Lie on your abdomen on a firm surface. 2. Prop yourself up on your elbows. 3. Use your arms to help lift your chest up until you feel a gentle stretch in your abdomen and your lower back. ? This will place some of your body weight on your elbows. If this is uncomfortable, try stacking pillows under your chest. ? Your hips should stay down, against the surface that you are lying on. Keep your hip and back muscles relaxed. 4. Hold for __________ seconds. 5. Slowly relax your upper body and return to the starting position. Repeat __________ times. Complete this exercise __________ times a day. Strengthening exercises These exercises build strength and endurance in your back. Endurance is the ability to use your muscles for a long time, even after they get tired. Exercise C: Pelvic tilt 1. Lie on your back on a firm surface. Bend your knees and keep your feet flat. 2. Tense your abdominal muscles. Tip your pelvis up toward the ceiling and flatten your lower back into the floor. ? To help with this exercise, you may place a small towel under your lower back and try to push your back into the towel. 3. Hold for __________ seconds. 4. Let your muscles relax completely before you repeat this exercise. Repeat __________ times. Complete this exercise __________ times a day. Exercise D: Alternating   arm and leg raises  1. Get on your hands and knees on a firm surface. If you are on a hard floor, you may want to use padding to cushion your knees, such as an exercise mat. 2. Line up your arms and legs. Your hands should be below your shoulders, and your knees should be below your hips. 3. Lift your left leg behind you. At the same time, raise your right arm and straighten it in front of you. ? Do not lift your leg higher than your hip. ? Do not lift your arm higher  than your shoulder. ? Keep your abdominal and back muscles tight. ? Keep your hips facing the ground. ? Do not arch your back. ? Keep your balance carefully, and do not hold your breath. 4. Hold for __________ seconds. 5. Slowly return to the starting position and repeat with your right leg and your left arm. Repeat __________ times. Complete this exercise __________ times a day. Exercise E: Abdominal set with straight leg raise  1. Lie on your back on a firm surface. 2. Bend one of your knees and keep your other leg straight. 3. Tense your abdominal muscles and lift your straight leg up, 4-6 inches (10-15 cm) off the ground. 4. Keep your abdominal muscles tight and hold for __________ seconds. ? Do not hold your breath. ? Do not arch your back. Keep it flat against the ground. 5. Keep your abdominal muscles tense as you slowly lower your leg back to the starting position. 6. Repeat with your other leg. Repeat __________ times. Complete this exercise __________ times a day. Posture and body mechanics  Body mechanics refers to the movements and positions of your body while you do your daily activities. Posture is part of body mechanics. Good posture and healthy body mechanics can help to relieve stress in your body's tissues and joints. Good posture means that your spine is in its natural S-curve position (your spine is neutral), your shoulders are pulled back slightly, and your head is not tipped forward. The following are general guidelines for applying improved posture and body mechanics to your everyday activities. Standing   When standing, keep your spine neutral and your feet about hip-width apart. Keep a slight bend in your knees. Your ears, shoulders, and hips should line up.  When you do a task in which you stand in one place for a long time, place one foot up on a stable object that is 2-4 inches (5-10 cm) high, such as a footstool. This helps keep your spine  neutral. Sitting   When sitting, keep your spine neutral and keep your feet flat on the floor. Use a footrest, if necessary, and keep your thighs parallel to the floor. Avoid rounding your shoulders, and avoid tilting your head forward.  When working at a desk or a computer, keep your desk at a height where your hands are slightly lower than your elbows. Slide your chair under your desk so you are close enough to maintain good posture.  When working at a computer, place your monitor at a height where you are looking straight ahead and you do not have to tilt your head forward or downward to look at the screen. Resting   When lying down and resting, avoid positions that are most painful for you.  If you have pain with activities such as sitting, bending, stooping, or squatting (flexion-based activities), lie in a position in which your body does not bend very much. For   example, avoid curling up on your side with your arms and knees near your chest (fetal position).  If you have pain with activities such as standing for a long time or reaching with your arms (extension-based activities), lie with your spine in a neutral position and bend your knees slightly. Try the following positions:  Lying on your side with a pillow between your knees.  Lying on your back with a pillow under your knees. Lifting   When lifting objects, keep your feet at least shoulder-width apart and tighten your abdominal muscles.  Bend your knees and hips and keep your spine neutral. It is important to lift using the strength of your legs, not your back. Do not lock your knees straight out.  Always ask for help to lift heavy or awkward objects. This information is not intended to replace advice given to you by your health care provider. Make sure you discuss any questions you have with your health care provider. Document Released: 03/01/2005 Document Revised: 11/06/2015 Document Reviewed: 12/11/2014 Elsevier  Interactive Patient Education  2018 [employer]  

## 2017-08-03 NOTE — Progress Notes (Signed)
Subjective:    Patient ID: Gwendolyn Pham, female    DOB: 12/08/1955, 62 y.o.   MRN: [id number]  08/03/2017  Chronic Conditions (6 month follow-up)    HPI This 62 y.o. female presents for SIX MONTH follow-up of DMII, hypertension, hypercholesterolemia.  No changes made at last visit.   Loves coffee; cannot tolerate tea.  Forgot about Cologuard; sitting in house.  Upcoming appointment with eye doctor; [facility] on [address]. Does not check BP at home.  Checks BP at pharmacy; 118-120 systolic. Refused to weigh today.   Does not check sugar at home. Monitors food intake closely at home.   Horrible pain; not in pain today.  L knee pain; rice crispies today. Lower back; legs; body.  Taking Tylenol for pain.  Went to [place] with sister and taking Tylenol every 2 hours.  Joints and muscles hurt all over. Onset two or three months ago. Right now is pain free. Then knees will go out. Feet has pins and needles everywhere.   Sister with diabetic neuropathy.  Could hardly walk due to pins and needles.   Knees will buckle.   Holding onto buggy.   Having 5 bad days per week.   BP Readings from Last 3 Encounters:  08/03/17 118/70  01/31/17 134/82  06/04/16 128/74   Wt Readings from Last 3 Encounters:  01/31/17 137 lb (62.1 kg)  06/04/16 142 lb 3.2 oz (64.5 kg)  06/18/15 143 lb 12.8 oz (65.2 kg)   Immunization History  Administered Date(s) Administered  . Influenza,inj,Quad PF,6+ Mos 01/31/2014, 01/31/2017  . Pneumococcal Polysaccharide-23 01/31/2017  . Tdap 02/13/2013    Review of Systems  Constitutional: Negative for activity change, appetite change, chills, diaphoresis, fatigue, fever and unexpected weight change.  HENT: Negative for congestion, dental problem, drooling, ear discharge, ear pain, facial swelling, hearing loss, mouth sores, nosebleeds, postnasal drip, rhinorrhea, sinus pressure, sneezing, sore throat, tinnitus, trouble swallowing and voice change.   Eyes: Negative  for photophobia, pain, discharge, redness, itching and visual disturbance.  Respiratory: Negative for apnea, cough, choking, chest tightness, shortness of breath, wheezing and stridor.   Cardiovascular: Negative for chest pain, palpitations and leg swelling.  Gastrointestinal: Negative for abdominal distention, abdominal pain, anal bleeding, blood in stool, constipation, diarrhea, nausea, rectal pain and vomiting.  Endocrine: Negative for cold intolerance, heat intolerance, polydipsia, polyphagia and polyuria.  Genitourinary: Negative for decreased urine volume, difficulty urinating, dyspareunia, dysuria, enuresis, flank pain, frequency, genital sores, hematuria, menstrual problem, pelvic pain, urgency, vaginal bleeding, vaginal discharge and vaginal pain.  Musculoskeletal: Positive for arthralgias, back pain, gait problem and myalgias. Negative for joint swelling, neck pain and neck stiffness.  Skin: Negative for color change, pallor, rash and wound.  Allergic/Immunologic: Negative for environmental allergies, food allergies and immunocompromised state.  Neurological: Positive for numbness. Negative for dizziness, tremors, seizures, syncope, facial asymmetry, speech difficulty, weakness, light-headedness and headaches.  Hematological: Negative for adenopathy. Does not bruise/bleed easily.  Psychiatric/Behavioral: Negative for agitation, behavioral problems, confusion, decreased concentration, dysphoric mood, hallucinations, self-injury, sleep disturbance and suicidal ideas. The patient is not nervous/anxious and is not hyperactive.     Past Medical History:  Diagnosis Date  . Anxiety   . Asthma   . Depression   . Diabetes mellitus without complication (HCC)   . Hyperlipidemia   . Hypertension    Past Surgical History:  Procedure Laterality Date  . DILATION AND CURETTAGE OF UTERUS    . INNER EAR SURGERY     Allergies  Allergen Reactions  .  Lisinopril Cough    Muscle pain   . Motrin  [Ibuprofen]   . Penicillins Rash   Current Outpatient Medications on File Prior to Visit  Medication Sig Dispense Refill  . aspirin EC 81 MG tablet Take 1 tablet (81 mg total) by mouth daily.    Marland Kitchen atorvastatin (LIPITOR) 20 MG tablet TAKE 1 TABLET BY MOUTH EVERY DAY 90 tablet 3  . Fluticasone-Salmeterol (ADVAIR DISKUS) 100-50 MCG/DOSE AEPB INHALE 1 PUFF INTO THE LUNGS 2 (TWO) TIMES DAILY. 60 each 11  . Multiple Vitamin (MULTIVITAMIN) tablet Take 1 tablet by mouth daily.    Marland Kitchen venlafaxine XR (EFFEXOR-XR) 150 MG 24 hr capsule Take 150 mg daily with breakfast by mouth.     No current facility-administered medications on file prior to visit.    Social History   Socioeconomic History  . Marital status: Single    Spouse name: Not on file  . Number of children: Not on file  . Years of education: Not on file  . Highest education level: Not on file  Occupational History  . Not on file  Social Needs  . Financial resource strain: Not on file  . Food insecurity:    Worry: Not on file    Inability: Not on file  . Transportation needs:    Medical: Not on file    Non-medical: Not on file  Tobacco Use  . Smoking status: Current Every Day Smoker  . Smokeless tobacco: Never Used  . Tobacco comment: 1/2 pack per day  Substance and Sexual Activity  . Alcohol use: No  . Drug use: No  . Sexual activity: Not Currently  Lifestyle  . Physical activity:    Days per week: Not on file    Minutes per session: Not on file  . Stress: Not on file  Relationships  . Social connections:    Talks on phone: Not on file    Gets together: Not on file    Attends religious service: Not on file    Active member of club or organization: Not on file    Attends meetings of clubs or organizations: Not on file    Relationship status: Not on file  . Intimate partner violence:    Fear of current or ex partner: Not on file    Emotionally abused: Not on file    Physically abused: Not on file    Forced sexual  activity: Not on file  Other Topics Concern  . Not on file  Social History Narrative   Marital status: single; not dating in 2018      Children: none      Lives: alone; 3 animals (cat, 2 dogs)      Employment:  Disability since 2004 due to depression/anxiety; followed by psychiatry.      Tobacco: 1 ppd x 30 years; quit once with patch      Alcohol: none      Drugs: none      Exercise: none in 2018      Seatbelt: 100%; no texting      ADLs: independent with ADLs; no assistant devices; drives      Advanced Directives:    Family History  Problem Relation Age of Onset  . Hypertension Mother   . Diabetes Mother   . COPD Father   . Heart disease Father 58       AMI x 3/CABG  . Diabetes Father   . Diabetes Sister   . Mental illness Sister  schizoaffective  . Diabetes Sister   . Arthritis Sister        Objective:    BP 118/70   Pulse 76   Temp 98 F (36.7 C) (Oral)   Resp 16   Ht 5' 4.57" (1.64 m)   SpO2 98%   BMI 23.10 kg/m  Physical Exam  Constitutional: She is oriented to person, place, and time. She appears well-developed and well-nourished. No distress.  HENT:  Head: Normocephalic and atraumatic.  Right Ear: External ear normal.  Left Ear: External ear normal.  Nose: Nose normal.  Mouth/Throat: Oropharynx is clear and moist.  Eyes: Pupils are equal, round, and reactive to light. Conjunctivae and EOM are normal.  Neck: Normal range of motion. Neck supple. Carotid bruit is not present. No thyromegaly present.  Cardiovascular: Normal rate, regular rhythm, normal heart sounds and intact distal pulses. Exam reveals no gallop and no friction rub.  No murmur heard. Pulmonary/Chest: Effort normal and breath sounds normal. She has no wheezes. She has no rales.  Abdominal: Soft. Bowel sounds are normal. She exhibits no distension and no mass. There is no tenderness. There is no rebound and no guarding.  Musculoskeletal:       Lumbar back: She exhibits decreased  range of motion, pain and spasm. She exhibits no tenderness, no bony tenderness, no swelling, no edema, no deformity, no laceration and normal pulse.  Lymphadenopathy:    She has no cervical adenopathy.  Neurological: She is alert and oriented to person, place, and time. No cranial nerve deficit.  Skin: Skin is warm and dry. No rash noted. She is not diaphoretic. No erythema. No pallor.  Psychiatric: She has a normal mood and affect. Her behavior is normal.   No results found. Depression screen [facility] 2/9 08/03/2017 01/31/2017 06/04/2016 06/18/2015 01/31/2014  Decreased Interest 0 0 0 0 1  Down, Depressed, Hopeless 0 0 3 0 1  PHQ - 2 Score 0 0 3 0 2  Altered sleeping - - 3 - 1  Tired, decreased energy - - 1 - 3  Change in appetite - - 0 - 1  Feeling bad or failure about yourself  - - 0 - 2  Trouble concentrating - - 0 - 2  Moving slowly or fidgety/restless - - 0 - 0  Suicidal thoughts - - 0 - 0  PHQ-9 Score - - 7 - 11  Difficult doing work/chores - - Not difficult at all - -   Fall Risk  08/03/2017 01/31/2017 01/31/2017 06/04/2016 06/18/2015  Falls in the past year? No Yes Yes No No  Number falls in past yr: - 1 1 - -  Injury with Fall? - No - - -        Assessment & Plan:   1. Type 2 diabetes mellitus without complication, without long-term current use of insulin (HCC)   2. HTN, goal below 140/90   3. Gastroesophageal reflux disease without esophagitis   4. Anxiety   5. Hypercholesteremia   6. Arthralgia of both knees   7. Acute left-sided low back pain without sciatica   8. Cyclic citrullinated peptide (CCP) antibody positive   9. Moderate persistent asthma without complication   10. Tobacco user     Arthralgias diffuse with acute onset lower back pain: New.  Onset 3 months ago.  Obtain autoimmune labs.  Obtain knee films and low back x-rays.  Refer to rheumatology.  Diabetes mellitus type 2, hypertension, hypercholesterolemia: Well-controlled.  Obtain labs.  Refills  provided.  Orders Placed This Encounter  Procedures  . DG Knee 1-2 Views Left    Standing Status:   Future    Number of Occurrences:   1    Standing Expiration Date:   08/03/2018    Order Specific Question:   Reason for Exam (SYMPTOM  OR DIAGNOSIS REQUIRED)    Answer:   B knee pain; giving out; no swelling; no trauma    Order Specific Question:   Preferred imaging location?    Answer:   External  . DG Knee 1-2 Views Right    Standing Status:   Future    Number of Occurrences:   1    Standing Expiration Date:   08/03/2018    Order Specific Question:   Reason for Exam (SYMPTOM  OR DIAGNOSIS REQUIRED)    Answer:   B knee pain; giving out; no swelling; no trauma    Order Specific Question:   Preferred imaging location?    Answer:   External  . DG Lumbar Spine Complete    Standing Status:   Future    Number of Occurrences:   1    Standing Expiration Date:   08/03/2018    Order Specific Question:   Reason for Exam (SYMPTOM  OR DIAGNOSIS REQUIRED)    Answer:   lower back pain LEFT    Order Specific Question:   Preferred imaging location?    Answer:   External  . CBC with Differential/Platelet  . Comprehensive metabolic panel    Order Specific Question:   Has the patient fasted?    Answer:   No  . Hemoglobin A1c  . Lipid panel    Order Specific Question:   Has the patient fasted?    Answer:   No  . CK  . Sedimentation rate  . ANA,IFA RA Diag Pnl w/rflx Tit/Patn  . Rheumatoid factor  . Ambulatory referral to Rheumatology    Referral Priority:   Routine    Referral Type:   Consultation    Referral Reason:   Specialty Services Required    Requested Specialty:   Rheumatology    Number of Visits Requested:   1   Meds ordered this encounter  Medications  . metoprolol tartrate (LOPRESSOR) 25 MG tablet    Sig: Take 1 tablet (25 mg total) by mouth 2 (two) times daily.    Dispense:  180 tablet    Refill:  1  . metFORMIN (GLUCOPHAGE) 500 MG tablet    Sig: TAKE 1 TABLET BY MOUTH EVERY  DAY WITH BREAKFAST    Dispense:  90 tablet    Refill:  1  . hydrochlorothiazide (HYDRODIURIL) 25 MG tablet    Sig: Take 1 tablet (25 mg total) by mouth daily.    Dispense:  90 tablet    Refill:  1    Return in about 6 months (around 02/03/2018) for follow-up chronic medical conditions Gwendolyn Pham.   Kaitlyn Franko Paulita Fujita, M.D. Primary Care at [facility] [address] [place], [place]  [address] [phone] phone [phone] fax

## 2017-08-03 NOTE — Telephone Encounter (Signed)
Pt was seen today in the office with Dr. Katrinka Blazing. Pt calling stating that she is now experiencing left leg pain that started approximately 45 min ago that goes from the bottom of her foot to the ankle up to the knee. Pt states she also experienced pain on Sat that felt like pins and needles in the same leg and that her leg went out. Pt states she is currently taking [facility] Tylenol with tabs that were  tabs and the pt asking if taking 3 tabs was ok. Pt advised to only take medication as written on the bottle which states to take 2 tabs at a time according to pt. Pt states during office visit in office today a prescription for Gabapentin was discussed. Pt states she was reluctant to try to take Gabapentin originally but states due to the leg pain tonight she is wiling to try the medication. Pt would like for [facility] [address]. Pt advised that if pain worsened during the night to seek treatment in Urgent Care/ED. Pt verbalized understanding and given home care advice to help with pain. Reason for Disposition . [1] MODERATE pain (e.g., interferes with normal activities, limping) AND [2] present > 3 days  Answer Assessment - Initial Assessment Questions 1. ONSET: "When did the pain start?"      Started approximately 45 min ago 2. LOCATION: "Where is the pain located?"      Left leg 3. PAIN: "How bad is the pain?"    (Scale 1-10; or mild, moderate, severe)   -  MILD (1-3): doesn't interfere with normal activities    -  MODERATE (4-7): interferes with normal activities (e.g., work or school) or awakens from sleep, limping    -  SEVERE (8-10): excruciating pain, unable to do any normal activities, unable to walk     Numerical rating not given 4. WORK OR EXERCISE: "Has there been any recent work or exercise that involved this part of the body?"      no 5. CAUSE: "What do you think is causing the leg pain?"     unsure 6. OTHER SYMPTOMS: "Do you have any other symptoms?"  (e.g., chest pain, back pain, breathing difficulty, swelling, rash, fever, numbness, weakness) No  Protocols used: LEG PAIN-A-AH

## 2017-08-04 LAB — CBC WITH DIFFERENTIAL/PLATELET
BASOS ABS: 0 10*3/uL (ref 0.0–0.2)
Basos: 0 %
EOS (ABSOLUTE): 0.1 10*3/uL (ref 0.0–0.4)
Eos: 1 %
Hematocrit: 44.2 % (ref 34.0–46.6)
Hemoglobin: 15.4 g/dL (ref 11.1–15.9)
Immature Grans (Abs): 0 10*3/uL (ref 0.0–0.1)
Immature Granulocytes: 0 %
LYMPHS ABS: 2.5 10*3/uL (ref 0.7–3.1)
Lymphs: 30 %
MCH: 31.8 pg (ref 26.6–33.0)
MCHC: 34.8 g/dL (ref 31.5–35.7)
MCV: 91 fL (ref 79–97)
MONOS ABS: 0.4 10*3/uL (ref 0.1–0.9)
Monocytes: 5 %
NEUTROS PCT: 64 %
Neutrophils Absolute: 5.2 10*3/uL (ref 1.4–7.0)
PLATELETS: 245 10*3/uL (ref 150–450)
RBC: 4.84 x10E6/uL (ref 3.77–5.28)
RDW: 14.5 % (ref 12.3–15.4)
WBC: 8.1 10*3/uL (ref 3.4–10.8)

## 2017-08-04 LAB — HEMOGLOBIN A1C
ESTIMATED AVERAGE GLUCOSE: 137 mg/dL
HEMOGLOBIN A1C: 6.4 % — AB (ref 4.8–5.6)

## 2017-08-04 LAB — COMPREHENSIVE METABOLIC PANEL
A/G RATIO: 1.5 (ref 1.2–2.2)
ALBUMIN: 4.2 g/dL (ref 3.6–4.8)
ALK PHOS: 96 IU/L (ref 39–117)
ALT: 16 IU/L (ref 0–32)
AST: 10 IU/L (ref 0–40)
BILIRUBIN TOTAL: 0.3 mg/dL (ref 0.0–1.2)
BUN / CREAT RATIO: 18 (ref 12–28)
BUN: 11 mg/dL (ref 8–27)
CO2: 24 mmol/L (ref 20–29)
CREATININE: 0.61 mg/dL (ref 0.57–1.00)
Calcium: 9.6 mg/dL (ref 8.7–10.3)
Chloride: 98 mmol/L (ref 96–106)
GFR calc Af Amer: 112 mL/min/{1.73_m2} (ref >59)
GFR calc non Af Amer: 97 mL/min/{1.73_m2} (ref >59)
GLUCOSE: 105 mg/dL — AB (ref 65–99)
Globulin, Total: 2.8 g/dL (ref 1.5–4.5)
POTASSIUM: 3.9 mmol/L (ref 3.5–5.2)
SODIUM: 138 mmol/L (ref 134–144)
TOTAL PROTEIN: 7 g/dL (ref 6.0–8.5)

## 2017-08-04 LAB — LIPID PANEL
CHOL/HDL RATIO: 3.8 ratio (ref 0.0–4.4)
Cholesterol, Total: 177 mg/dL (ref 100–199)
HDL: 46 mg/dL (ref >39)
LDL Calculated: 109 mg/dL — ABNORMAL HIGH (ref 0–99)
Triglycerides: 109 mg/dL (ref 0–149)
VLDL CHOLESTEROL CAL: 22 mg/dL (ref 5–40)

## 2017-08-05 LAB — ANA,IFA RA DIAG PNL W/RFLX TIT/PATN
ANA TITER 1: NEGATIVE
Cyclic Citrullin Peptide Ab: 21 units — ABNORMAL HIGH (ref 0–19)
Rhuematoid fact SerPl-aCnc: 11.3 IU/mL (ref 0.0–13.9)

## 2017-08-05 LAB — SEDIMENTATION RATE: Sed Rate: 36 mm/hr (ref 0–40)

## 2017-08-05 LAB — CK: CK TOTAL: 43 U/L (ref 24–173)

## 2017-08-09 ENCOUNTER — Encounter: Payer: Self-pay | Admitting: Family Medicine

## 2017-08-10 ENCOUNTER — Telehealth: Payer: Self-pay | Admitting: Family Medicine

## 2017-08-10 NOTE — Telephone Encounter (Signed)
Copied from CRM [id number]. Topic: Quick Communication - See Telephone Encounter >> Aug 10, 2017 10:26 AM Oneal Grout wrote: CRM for notification. See Telephone encounter for: 08/10/17. Requesting to speak with nurse regarding gabapentin, would like to start med.

## 2017-08-12 NOTE — Telephone Encounter (Signed)
Phone call to patient. Unable to reach. If patient calls back, what pharmacy would she like gabapentin sent to?

## 2017-10-11 DIAGNOSIS — J4540 Moderate persistent asthma, uncomplicated: Secondary | ICD-10-CM | POA: Insufficient documentation

## 2017-11-09 DIAGNOSIS — F609 Personality disorder, unspecified: Secondary | ICD-10-CM | POA: Diagnosis not present

## 2017-11-23 DIAGNOSIS — M2550 Pain in unspecified joint: Secondary | ICD-10-CM | POA: Diagnosis not present

## 2017-11-23 LAB — CBC AND DIFFERENTIAL
HCT: 46 (ref 36–46)
Hemoglobin: 16 (ref 12.0–16.0)
Platelets: 252 (ref 150–399)
WBC: 8.9

## 2017-12-20 ENCOUNTER — Other Ambulatory Visit: Payer: Self-pay | Admitting: Physician Assistant

## 2017-12-20 DIAGNOSIS — E119 Type 2 diabetes mellitus without complications: Secondary | ICD-10-CM

## 2017-12-20 DIAGNOSIS — I10 Essential (primary) hypertension: Secondary | ICD-10-CM

## 2018-01-10 ENCOUNTER — Other Ambulatory Visit: Payer: Self-pay

## 2018-01-11 ENCOUNTER — Other Ambulatory Visit: Payer: Self-pay | Admitting: Family Medicine

## 2018-01-11 DIAGNOSIS — Z720 Tobacco use: Secondary | ICD-10-CM

## 2018-01-11 NOTE — Telephone Encounter (Signed)
Requested medication (s) are due for refill today -not on list  Requested medication (s) are on the active medication list - not on list  Future visit scheduled -no  Last refill: 11 months ago  Notes to clinic: Patient is requesting a medication not on her medication list- patient was last seen in office in May by Kevin Fenton, MD- she has not established care with another provider.  Requested Prescriptions  Pending Prescriptions Disp Refills   WIXELA INHUB 100-50 MCG/DOSE AEPB [Pharmacy Med Name: WIXELA 100-50 INHUB] 180 each 3    Sig: TAKE 1 PUFF BY MOUTH TWICE A DAY     Pulmonology:  [employer] Passed - 01/11/2018  1:52 PM      Passed - Valid encounter within last 12 months    Recent Outpatient Visits          5 months ago Type 2 diabetes mellitus without complication, without long-term current use of insulin [facility])   Primary Care at [facility], Myrle Sheng, MD   11 months ago Encounter for [employer] annual wellness exam   Primary Care at [facility], Myrle Sheng, MD   1 year ago Type 2 diabetes mellitus without complication, without long-term current use of insulin [facility])   Primary Care at [facility], [place] D, PA-C   2 years ago Type 2 diabetes mellitus without complication, without long-term current use of insulin [facility])   Primary Care at [facility], Myrle Sheng, MD   3 years ago HTN, goal below 140/90   [facility] at [facility], Franki Cabot, MD              Requested Prescriptions  Pending Prescriptions Disp Refills   WIXELA INHUB 100-50 MCG/DOSE AEPB [Pharmacy Med Name: WIXELA 100-50 INHUB] 180 each 3    Sig: TAKE 1 PUFF BY MOUTH TWICE A DAY     Pulmonology:  [employer] Passed - 01/11/2018  1:52 PM      Passed - Valid encounter within last 12 months    Recent Outpatient Visits          5 months ago Type 2 diabetes mellitus without complication, without long-term current use of insulin [facility])   Primary Care at [facility], Myrle Sheng, MD   11  months ago Encounter for [employer] annual wellness exam   Primary Care at [facility], Myrle Sheng, MD   1 year ago Type 2 diabetes mellitus without complication, without long-term current use of insulin [facility])   Primary Care at [place], [place] D, PA-C   2 years ago Type 2 diabetes mellitus without complication, without long-term current use of insulin [facility])   Primary Care at [facility], Myrle Sheng, MD   3 years ago HTN, goal below 140/90   Primary Care at [facility], Franki Cabot, MD

## 2018-01-17 ENCOUNTER — Ambulatory Visit: Payer: Medicare Other | Admitting: Family Medicine

## 2018-01-23 ENCOUNTER — Encounter: Payer: Self-pay | Admitting: Family Medicine

## 2018-01-23 ENCOUNTER — Other Ambulatory Visit: Payer: Self-pay

## 2018-01-23 ENCOUNTER — Ambulatory Visit (INDEPENDENT_AMBULATORY_CARE_PROVIDER_SITE_OTHER): Payer: Medicare Other | Admitting: Family Medicine

## 2018-01-23 VITALS — BP 144/88 | HR 75 | Temp 97.8°F | Ht 64.57 in | Wt 136.2 lb

## 2018-01-23 DIAGNOSIS — Z23 Encounter for immunization: Secondary | ICD-10-CM | POA: Diagnosis not present

## 2018-01-23 DIAGNOSIS — I739 Peripheral vascular disease, unspecified: Secondary | ICD-10-CM

## 2018-01-23 DIAGNOSIS — E119 Type 2 diabetes mellitus without complications: Secondary | ICD-10-CM

## 2018-01-23 DIAGNOSIS — R0989 Other specified symptoms and signs involving the circulatory and respiratory systems: Secondary | ICD-10-CM | POA: Diagnosis not present

## 2018-01-23 DIAGNOSIS — E785 Hyperlipidemia, unspecified: Secondary | ICD-10-CM | POA: Diagnosis not present

## 2018-01-23 DIAGNOSIS — F17200 Nicotine dependence, unspecified, uncomplicated: Secondary | ICD-10-CM | POA: Diagnosis not present

## 2018-01-23 DIAGNOSIS — I10 Essential (primary) hypertension: Secondary | ICD-10-CM

## 2018-01-23 MED ORDER — METFORMIN HCL 500 MG PO TABS: 500.0000 mg | ORAL_TABLET | Freq: Every day | ORAL | 1 refills | Status: AC

## 2018-01-23 MED ORDER — HYDROCHLOROTHIAZIDE 25 MG PO TABS: 25.0000 mg | ORAL_TABLET | Freq: Every day | ORAL | 1 refills | Status: AC

## 2018-01-23 MED ORDER — ATORVASTATIN CALCIUM 20 MG PO TABS: 20.0000 mg | ORAL_TABLET | Freq: Every day | ORAL | 3 refills | Status: DC

## 2018-01-23 MED ORDER — METOPROLOL TARTRATE 25 MG PO TABS: 25.0000 mg | ORAL_TABLET | Freq: Two times a day (BID) | ORAL | 1 refills | Status: AC

## 2018-01-23 MED ORDER — VENLAFAXINE HCL ER 150 MG PO CP24: 150.0000 mg | ORAL_CAPSULE | Freq: Every day | ORAL | 1 refills | Status: DC

## 2018-01-23 MED ORDER — BUPROPION HCL ER (SR) 150 MG PO TB12: 150.0000 mg | ORAL_TABLET | Freq: Two times a day (BID) | ORAL | 2 refills | Status: AC

## 2018-01-23 NOTE — Patient Instructions (Addendum)
Start wellbutrin once a day x 1 week Then increase to twice a day x 1 week Then quit smoking, cont with wellbutrin twice a day  1 800 quit now   Coping with Quitting Smoking Quitting smoking is a physical and mental challenge. You will face cravings, withdrawal symptoms, and temptation. Before quitting, work with your health care provider to make a plan that can help you cope. Preparation can help you quit and keep you from giving in. How can I cope with cravings? Cravings usually last for 5-10 minutes. If you get through it, the craving will pass. Consider taking the following actions to help you cope with cravings:  Keep your mouth busy: ? Chew sugar-free gum. ? Suck on hard candies or a straw. ? Brush your teeth.  Keep your hands and body busy: ? Immediately change to a different activity when you feel a craving. ? Squeeze or play with a ball. ? Do an activity or a hobby, like making bead jewelry, practicing needlepoint, or working with wood. ? Mix up your normal routine. ? Take a short exercise break. Go for a quick walk or run up and down stairs. ? Spend time in public places where smoking is not allowed.  Focus on doing something kind or helpful for someone else.  Call a friend or family member to talk during a craving.  Join a support group.  Call a quit line, such as 1-800-QUIT-NOW.  Talk with your health care provider about medicines that might help you cope with cravings and make quitting easier for you.  How can I deal with withdrawal symptoms? Your body may experience negative effects as it tries to get used to not having nicotine in the system. These effects are called withdrawal symptoms. They may include:  Feeling hungrier than normal.  Trouble concentrating.  Irritability.  Trouble sleeping.  Feeling depressed.  Restlessness and agitation.  Craving a cigarette.  To manage withdrawal symptoms:  Avoid places, people, and activities that trigger your  cravings.  Remember why you want to quit.  Get plenty of sleep.  Avoid coffee and other caffeinated drinks. These may worsen some of your symptoms.  How can I handle social situations? Social situations can be difficult when you are quitting smoking, especially in the first few weeks. To manage this, you can:  Avoid parties, bars, and other social situations where people might be smoking.  Avoid alcohol.  Leave right away if you have the urge to smoke.  Explain to your family and friends that you are quitting smoking. Ask for understanding and support.  Plan activities with friends or family where smoking is not an option.  What are some ways I can cope with stress? Wanting to smoke may cause stress, and stress can make you want to smoke. Find ways to manage your stress. Relaxation techniques can help. For example:  Breathe slowly and deeply, in through your nose and out through your mouth.  Listen to soothing, relaxing music.  Talk with a family member or friend about your stress.  Light a candle.  Soak in a bath or take a shower.  Think about a peaceful place.  What are some ways I can prevent weight gain? Be aware that many people gain weight after they quit smoking. However, not everyone does. To keep from gaining weight, have a plan in place before you quit and stick to the plan after you quit. Your plan should include:  Having healthy snacks. When you have a  craving, it may help to: ? Eat plain popcorn, crunchy carrots, celery, or other cut vegetables. ? Chew sugar-free gum.  Changing how you eat: ? Eat small portion sizes at meals. ? Eat 4-6 small meals throughout the day instead of 1-2 large meals a day. ? Be mindful when you eat. Do not watch television or do other things that might distract you as you eat.  Exercising regularly: ? Make time to exercise each day. If you do not have time for a long workout, do short bouts of exercise for 5-10 minutes several  times a day. ? Do some form of strengthening exercise, like weight lifting, and some form of aerobic exercise, like running or swimming.  Drinking plenty of water or other low-calorie or no-calorie drinks. Drink 6-8 glasses of water daily, or as much as instructed by your health care provider.  Summary  Quitting smoking is a physical and mental challenge. You will face cravings, withdrawal symptoms, and temptation to smoke again. Preparation can help you as you go through these challenges.  You can cope with cravings by keeping your mouth busy (such as by chewing gum), keeping your body and hands busy, and making calls to family, friends, or a helpline for people who want to quit smoking.  You can cope with withdrawal symptoms by avoiding places where people smoke, avoiding drinks with caffeine, and getting plenty of rest.  Ask your health care provider about the different ways to prevent weight gain, avoid stress, and handle social situations. This information is not intended to replace advice given to you by your health care provider. Make sure you discuss any questions you have with your health care provider. Document Released: 02/27/2016 Document Revised: 02/27/2016 Document Reviewed: 02/27/2016 Elsevier Interactive Patient Education  [employer].   If you have lab work done today you will be contacted with your lab results within the next 2 weeks.  If you have not heard from [place] then please contact us. The fastest way to get your results is to register for My Chart.   IF you received an x-ray today, you will receive an invoice from [facility]. Please contact [facility] at [phone] with questions or concerns regarding your invoice.   IF you received labwork today, you will receive an invoice from [facility]. Please contact [facility] at [phone] with questions or concerns regarding your invoice.   Our billing staff will not be able to assist you with  questions regarding bills from these companies.  You will be contacted with the lab results as soon as they are available. The fastest way to get your results is to activate your My Chart account. Instructions are located on the last page of this paperwork. If you have not heard from [place] regarding the results in 2 weeks, please contact this office.

## 2018-01-23 NOTE — Progress Notes (Signed)
11/11/201911:45 AM  Gwendolyn Pham [birth year], 62 y.o. female [id number]  Chief Complaint  Patient presents with  . Medication Refill    hrdrochlorothiazide, metformin, metoprolol and lipitor  . Pain    diabetic nerv pain in the feet that radiates up the legs    HPI:   Patient is a 62 y.o. female with past medical history significant for DM2, HTN, HLP, GERD, depression with anxiety who presents today for routine followup  Previous PCP Dr Tamala Julian Last visit in may 2019  Having random onset of numbness, pin needles pain, starts in her feet and travels up her legs Her coccyx feels that is gets numb at times Happens when she walks or stands  Better when she rests  Lumbar spine xray 07/2017 - DJD and aortic athersclerosis  Smokes 1/2-1ppd  Has been smoking for about 40 years   Lab Results  Component Value Date   HGBA1C 6.4 (H) 08/03/2017   HGBA1C 6.6 (H) 01/31/2017   HGBA1C 6.4 (H) 06/04/2016   Lab Results  Component Value Date   MICROALBUR 0.2 06/18/2015   LDLCALC 109 (H) 08/03/2017   CREATININE 0.61 08/03/2017    Fall Risk  01/23/2018 08/03/2017 01/31/2017 01/31/2017 06/04/2016  Falls in the past year? 0 No Yes Yes No  Number falls in past yr: - - 1 1 -  Injury with Fall? - - No - -     Depression screen [facility] 2/9 08/03/2017 01/31/2017 06/04/2016  Decreased Interest 0 0 0  Down, Depressed, Hopeless 0 0 3  PHQ - 2 Score 0 0 3  Altered sleeping - - 3  Tired, decreased energy - - 1  Change in appetite - - 0  Feeling bad or failure about yourself  - - 0  Trouble concentrating - - 0  Moving slowly or fidgety/restless - - 0  Suicidal thoughts - - 0  PHQ-9 Score - - 7  Difficult doing work/chores - - Not difficult at all    Allergies  Allergen Reactions  . Lisinopril Cough    Muscle pain   . Motrin [Ibuprofen]   . Penicillins Rash    Prior to Admission medications   Medication Sig Start Date End Date Taking? Authorizing Provider  atorvastatin (LIPITOR) 20 MG  tablet TAKE 1 TABLET BY MOUTH EVERY DAY 06/03/17  Yes Wardell Honour, MD  hydrochlorothiazide (HYDRODIURIL) 25 MG tablet TAKE 1 TABLET BY MOUTH EVERY DAY 12/20/17  Yes Stallings, Zoe A, MD  metFORMIN (GLUCOPHAGE) 500 MG tablet TAKE 1 TABLET BY MOUTH EVERY DAY WITH BREAKFAST 12/20/17  Yes Stallings, Zoe A, MD  metoprolol tartrate (LOPRESSOR) 25 MG tablet TAKE 1 TABLET BY MOUTH TWICE A DAY 12/20/17  Yes Stallings, Zoe A, MD  venlafaxine XR (EFFEXOR-XR) 150 MG 24 hr capsule Take 150 mg daily with breakfast by mouth.   Yes [provider]    Past Medical History:  Diagnosis Date  . Anxiety   . Asthma   . Depression   . Diabetes mellitus without complication ([place])   . Hyperlipidemia   . Hypertension     Past Surgical History:  Procedure Laterality Date  . DILATION AND CURETTAGE OF UTERUS    . INNER EAR SURGERY      Social History   Tobacco Use  . Smoking status: Current Every Day Smoker  . Smokeless tobacco: Never Used  . Tobacco comment: 1/2 pack per day  Substance Use Topics  . Alcohol use: No    Family History  Problem Relation Age of Onset  . Hypertension Mother   . Diabetes Mother   . COPD Father   . Heart disease Father 56       AMI x 3/CABG  . Diabetes Father   . Diabetes Sister   . Mental illness Sister        schizoaffective  . Diabetes Sister   . Arthritis Sister     Review of Systems  Constitutional: Negative for chills and fever.  Respiratory: Negative for cough and shortness of breath.   Cardiovascular: Negative for chest pain, palpitations and leg swelling.  Gastrointestinal: Negative for abdominal pain, nausea and vomiting.  Neurological: Positive for tingling.     OBJECTIVE:  Blood pressure (!) 144/88, pulse 75, temperature 97.8 F (36.6 C), temperature source Oral, height 5' 4.57" (1.64 m), weight 136 lb 3.2 oz (61.8 kg), SpO2 97 %. Body mass index is 22.97 kg/m.   BP Readings from Last 3 Encounters:  01/23/18 (!) 144/88  08/03/17  118/70  01/31/17 134/82  just had a cigarrette  Physical Exam  Constitutional: She is oriented to person, place, and time. She appears well-developed and well-nourished.  HENT:  Head: Normocephalic and atraumatic.  Mouth/Throat: Oropharynx is clear and moist. No oropharyngeal exudate.  Eyes: Pupils are equal, round, and reactive to light. Conjunctivae and EOM are normal. No scleral icterus.  Neck: Neck supple.  Cardiovascular: Normal rate, regular rhythm and normal heart sounds. Exam reveals no gallop and no friction rub.  No murmur heard. Pulses:      Dorsalis pedis pulses are 1+ on the right side, and 1+ on the left side.       Posterior tibial pulses are 1+ on the right side, and 1+ on the left side.  Pulmonary/Chest: Effort normal and breath sounds normal. She has no wheezes. She has no rales.  Musculoskeletal: She exhibits no edema.  Feet:  Right Foot:  Skin Integrity: Positive for callus and dry skin.  Left Foot:  Skin Integrity: Positive for callus and dry skin.  Neurological: She is alert and oriented to person, place, and time.  Skin: Skin is warm and dry.  Psychiatric: She has a normal mood and affect.  Nursing note and vitals reviewed.    Diabetic Foot Exam - Simple   Simple Foot Form Visual Inspection No deformities, no ulcerations, no other skin breakdown bilaterally:  Yes Sensation Testing Intact to touch and monofilament testing bilaterally:  Yes See comments:  Yes Pulse Check Comments Felt all pricks with the filament, has broken skin and callus      ASSESSMENT and PLAN  1. Type 2 diabetes mellitus without complication, without long-term current use of insulin ([place]) Checking labs today, medications will be adjusted as needed.  - Microalbumin/Creatinine Ratio, Urine - Ambulatory referral to Ophthalmology - Lipid panel - CMP14+EGFR - TSH - CBC with Differential/Platelet - Hemoglobin A1c - metFORMIN (GLUCOPHAGE) 500 MG tablet; Take 1 tablet (500 mg  total) by mouth daily with breakfast.  2. Need for prophylactic vaccination and inoculation against influenza - Flu Vaccine QUAD 36+ mos IM  3. Claudication of both lower extremities ([place]) 4. Decreased pulses in feet H/o suggestive of claudication. Discussed importance of smoking cessation. Checking abi. Possible vas surg referral - VAS [place] ABI WITH/WO TBI; Future  5. Tobacco use disorder The patient was counseled for > 10 minutes on the dangers of tobacco use, and was advised to quit.  Reviewed strategies to maximize success, including pharmacotherapy (wellbutrin) r/se/b. Advised 1  800 quit now. Discussed coping strategies.   6. HTN, goal below 140/90 Elevated today. Just had a cig. Patient reports BP usually at goal and has been at last several visits. Recheck at next visit - Care order/instruction: - hydrochlorothiazide (HYDRODIURIL) 25 MG tablet; Take 1 tablet (25 mg total) by mouth daily. - metoprolol tartrate (LOPRESSOR) 25 MG tablet; Take 1 tablet (25 mg total) by mouth 2 (two) times daily.  7. Hyperlipidemia, unspecified hyperlipidemia type Checking labs today, medications will be adjusted as needed.  - atorvastatin (LIPITOR) 20 MG tablet; Take 1 tablet (20 mg total) by mouth daily.  Other orders - buPROPion (WELLBUTRIN SR) 150 MG 12 hr tablet; Take 1 tablet (150 mg total) by mouth 2 (two) times daily. - venlafaxine XR (EFFEXOR-XR) 150 MG 24 hr capsule; Take 1 capsule (150 mg total) by mouth daily with breakfast.    Return in about 3 months (around 04/25/2018) for chronic medical conditions.    Rutherford Guys, MD Primary Care at [place], [place] [address] Ph.  [phone] Fax [phone]

## 2018-01-24 LAB — LIPID PANEL
Chol/HDL Ratio: 3.8 ratio (ref 0.0–4.4)
Cholesterol, Total: 176 mg/dL (ref 100–199)
HDL: 46 mg/dL (ref >39)
LDL Calculated: 109 mg/dL — ABNORMAL HIGH (ref 0–99)
Triglycerides: 106 mg/dL (ref 0–149)
VLDL Cholesterol Cal: 21 mg/dL (ref 5–40)

## 2018-01-24 LAB — CBC WITH DIFFERENTIAL/PLATELET
Basophils Absolute: 0.1 10*3/uL (ref 0.0–0.2)
Basos: 1 %
EOS (ABSOLUTE): 0.1 10*3/uL (ref 0.0–0.4)
Eos: 2 %
Hematocrit: 45.9 % (ref 34.0–46.6)
Hemoglobin: 15.8 g/dL (ref 11.1–15.9)
Immature Grans (Abs): 0 10*3/uL (ref 0.0–0.1)
Immature Granulocytes: 0 %
Lymphocytes Absolute: 2.8 10*3/uL (ref 0.7–3.1)
Lymphs: 35 %
MCH: 32 pg (ref 26.6–33.0)
MCHC: 34.4 g/dL (ref 31.5–35.7)
MCV: 93 fL (ref 79–97)
Monocytes Absolute: 0.5 10*3/uL (ref 0.1–0.9)
Monocytes: 6 %
Neutrophils Absolute: 4.5 10*3/uL (ref 1.4–7.0)
Neutrophils: 56 %
Platelets: 253 10*3/uL (ref 150–450)
RBC: 4.93 x10E6/uL (ref 3.77–5.28)
RDW: 13.7 % (ref 12.3–15.4)
WBC: 7.9 10*3/uL (ref 3.4–10.8)

## 2018-01-24 LAB — MICROALBUMIN / CREATININE URINE RATIO
Creatinine, Urine: 176.7 mg/dL
Microalb/Creat Ratio: 18 mg/g creat (ref 0.0–30.0)
Microalbumin, Urine: 31.8 ug/mL

## 2018-01-24 LAB — CMP14+EGFR
ALT: 14 IU/L (ref 0–32)
AST: 13 IU/L (ref 0–40)
Albumin/Globulin Ratio: 1.5 (ref 1.2–2.2)
Albumin: 4.3 g/dL (ref 3.6–4.8)
Alkaline Phosphatase: 98 IU/L (ref 39–117)
BUN/Creatinine Ratio: 14 (ref 12–28)
BUN: 11 mg/dL (ref 8–27)
Bilirubin Total: 0.2 mg/dL (ref 0.0–1.2)
CO2: 23 mmol/L (ref 20–29)
Calcium: 9.4 mg/dL (ref 8.7–10.3)
Chloride: 98 mmol/L (ref 96–106)
Creatinine, Ser: 0.78 mg/dL (ref 0.57–1.00)
GFR calc Af Amer: 94 mL/min/{1.73_m2} (ref >59)
GFR calc non Af Amer: 82 mL/min/{1.73_m2} (ref >59)
Globulin, Total: 2.9 g/dL (ref 1.5–4.5)
Glucose: 103 mg/dL — ABNORMAL HIGH (ref 65–99)
Potassium: 4.3 mmol/L (ref 3.5–5.2)
Sodium: 137 mmol/L (ref 134–144)
Total Protein: 7.2 g/dL (ref 6.0–8.5)

## 2018-01-24 LAB — HEMOGLOBIN A1C
Est. average glucose Bld gHb Est-mCnc: 137 mg/dL
Hgb A1c MFr Bld: 6.4 % — ABNORMAL HIGH (ref 4.8–5.6)

## 2018-01-24 LAB — TSH: TSH: 1.47 u[IU]/mL (ref 0.450–4.500)

## 2018-01-25 ENCOUNTER — Ambulatory Visit (HOSPITAL_COMMUNITY): Admission: RE | Admit: 2018-01-25 | Payer: Medicare Other | Source: Ambulatory Visit

## 2018-01-26 ENCOUNTER — Ambulatory Visit: Payer: Self-pay | Admitting: *Deleted

## 2018-01-26 ENCOUNTER — Ambulatory Visit (HOSPITAL_COMMUNITY)
Admission: RE | Admit: 2018-01-26 | Discharge: 2018-01-26 | Disposition: A | Discharge status: Home / Self Care | Payer: Medicare Other | Source: Ambulatory Visit | Attending: Family Medicine | Admitting: Family Medicine

## 2018-01-26 DIAGNOSIS — I739 Peripheral vascular disease, unspecified: Secondary | ICD-10-CM | POA: Insufficient documentation

## 2018-01-26 DIAGNOSIS — R6889 Other general symptoms and signs: Secondary | ICD-10-CM

## 2018-01-26 DIAGNOSIS — R0989 Other specified symptoms and signs involving the circulatory and respiratory systems: Secondary | ICD-10-CM | POA: Diagnosis not present

## 2018-01-26 NOTE — Progress Notes (Addendum)
VASCULAR LAB PRELIMINARY  ARTERIAL  ABI completed:    RIGHT    LEFT    PRESSURE WAVEFORM  PRESSURE WAVEFORM  BRACHIAL 171 triphasic BRACHIAL 168 triphasic  DP 61 Dampened monophasic DP 55 Dampened monophasic         PT 76 Dampened monophasic PT 64 Dampened monophasic         GREAT TOE  NA GREAT TOE  NA    RIGHT LEFT  ABI 0.44 0.37   Bilateral ABI's indicate severe lower extremity arterial disease.  Leander RamsMegan E Tammy Wickliffe, RVS 01/26/2018, 4:30 PM

## 2018-01-26 NOTE — Telephone Encounter (Signed)
Patient states she is concerned over her studies.  Patient was watching on screen- and she is concerned about her results. Patient thinks she has major blockage- she would like her results.  Patient notified- Dr Leretha PolSantiago has not received her report yet- but as soon as she does- she will let her know the results. If there is anything wrong - it will be addressed and fixed if possible. Patient is calmer and she will await the results.   Reason for Disposition . [1] Caller requesting NON-URGENT health information AND [2] PCP's office is the best resource  Answer Assessment - Initial Assessment Questions 1. REASON FOR CALL or QUESTION: "What is your reason for calling today?" or "How can I best help you?" or "What question do you have that I can help answer?"     Patient is calling - concerned about results of vascular test today  Protocols used: INFORMATION ONLY CALL-A-AH

## 2018-01-26 NOTE — Telephone Encounter (Signed)
Phone call from Vascular Lab.  Pt in office.  ABI .44 and .37.  Spoke to [place].  Place referral to vascular surg.

## 2018-01-28 MED ORDER — ATORVASTATIN CALCIUM 40 MG PO TABS: 40.0000 mg | ORAL_TABLET | Freq: Every day | ORAL | 3 refills | Status: DC

## 2018-01-28 NOTE — Addendum Note (Signed)
Addended by: Myles LippsSANTIAGO, Charlane Westry M on: 01/28/2018 08:12 PM   Modules accepted: Orders

## 2018-02-01 ENCOUNTER — Encounter: Payer: Self-pay | Admitting: Family Medicine

## 2018-02-08 ENCOUNTER — Other Ambulatory Visit: Payer: Self-pay | Admitting: Family Medicine

## 2018-02-08 DIAGNOSIS — F609 Personality disorder, unspecified: Secondary | ICD-10-CM | POA: Diagnosis not present

## 2018-02-10 ENCOUNTER — Other Ambulatory Visit: Payer: Self-pay | Admitting: Family Medicine

## 2018-02-10 NOTE — Telephone Encounter (Signed)
Call to pharmacy- Refill approved with verbal approval.

## 2018-02-10 NOTE — Telephone Encounter (Signed)
Labs reviewed and addressed. Rx refilled per protocol.  Requested Prescriptions  Pending Prescriptions Disp Refills  . venlafaxine XR (EFFEXOR-XR) 150 MG 24 hr capsule [Pharmacy Med Name: VENLAFAXINE HCL ER 150 MG CAP] 90 capsule 1    Sig: TAKE 1 CAPSULE BY MOUTH EVERY DAY WITH BREAKFAST     Psychiatry: Antidepressants - SNRI - desvenlafaxine & venlafaxine Failed - 02/10/2018  2:41 PM      Failed - LDL in normal range and within 360 days    LDL Calculated  Date Value Ref Range Status  01/23/2018 109 (H) 0 - 99 mg/dL Final         Failed - Last BP in normal range    BP Readings from Last 1 Encounters:  01/23/18 (!) 144/88         Passed - Total Cholesterol in normal range and within 360 days    Cholesterol, Total  Date Value Ref Range Status  01/23/2018 176 100 - 199 mg/dL Final         Passed - Triglycerides in normal range and within 360 days    Triglycerides  Date Value Ref Range Status  01/23/2018 106 0 - 149 mg/dL Final         Passed - Completed PHQ-2 or PHQ-9 in the last 360 days.      Passed - Valid encounter within last 6 months    Recent Outpatient Visits          2 weeks ago Type 2 diabetes mellitus without complication, without long-term current use of insulin [facility](HCC)   [facility], Meda CoffeeIrma M, MD   6 months ago Type 2 diabetes mellitus without complication, without long-term current use of insulin [facility](HCC)   [facility], Myrle ShengKristi M, MD   1 year ago Encounter for Medicare annual wellness exam   [facility], Myrle ShengKristi M, MD   1 year ago Type 2 diabetes mellitus without complication, without long-term current use of insulin [facility](HCC)   [facility], [place] D, PA-C   2 years ago Type 2 diabetes mellitus without complication, without long-term current use of insulin [facility](HCC)   [facility], Myrle ShengKristi M, MD      Future Appointments            In 2 weeks Myles LippsSantiago, Irma M, MD [facility]   In 2 months Myles LippsSantiago, Irma M, MD [facility]

## 2018-02-18 ENCOUNTER — Other Ambulatory Visit: Payer: Self-pay | Admitting: Family Medicine

## 2018-02-20 ENCOUNTER — Telehealth: Payer: Self-pay | Admitting: *Deleted

## 2018-02-20 NOTE — Telephone Encounter (Signed)
Please review for request from pharmacy. [facility] # [id number]. Dr. Leretha PolSantiago

## 2018-02-20 NOTE — Telephone Encounter (Signed)
Left message on patient's voicemail to check with [facility], she should have a refill at pharmacy for medication requested.

## 2018-02-28 ENCOUNTER — Ambulatory Visit: Payer: Medicare Other | Admitting: Family Medicine

## 2018-02-28 ENCOUNTER — Encounter: Payer: Self-pay | Admitting: Family Medicine

## 2018-03-20 ENCOUNTER — Telehealth: Payer: Self-pay | Admitting: Family Medicine

## 2018-03-20 NOTE — Telephone Encounter (Signed)
Copied from CRM [id number]. Topic: General - Other >> Mar 20, 2018  4:21 PM Percival Spanish wrote:  Joycelyn Schmid with [facility] ccall to say pt was schedule for an appt and then she cancel the appt and they have not been able to reach her to reschedule.

## 2018-03-21 NOTE — Telephone Encounter (Signed)
noted 

## 2018-04-04 ENCOUNTER — Encounter: Payer: Self-pay | Admitting: Vascular Surgery

## 2018-04-04 ENCOUNTER — Other Ambulatory Visit: Payer: Self-pay

## 2018-04-04 ENCOUNTER — Ambulatory Visit (INDEPENDENT_AMBULATORY_CARE_PROVIDER_SITE_OTHER): Payer: Medicare Other | Admitting: Vascular Surgery

## 2018-04-04 VITALS — BP 137/98 | HR 96 | Temp 96.6°F | Resp 20 | Ht 64.0 in | Wt 137.0 lb

## 2018-04-04 DIAGNOSIS — I739 Peripheral vascular disease, unspecified: Secondary | ICD-10-CM | POA: Diagnosis not present

## 2018-04-04 NOTE — Progress Notes (Signed)
Vascular and Vein Specialist of [place]  Patient name: Gwendolyn Pham MRN: [id number] DOB: Oct 31, 1955 Sex: female  REASON FOR CONSULT: Valuation of limiting claudication both lower extremities  HPI: Gwendolyn Pham is a 63 y.o. female, who is here today for evaluation of limiting claudication both lower extremities.  She is here today with her sister.  I had worked with the patient who was a Scientist, research (physical sciences)ward secretary at [employer] years ago and her sister was a Buyer, retailrespiratory therapist.  The patient has had significantly progressive difficulty with ambulation.  She reports this occurs in her hips and buttocks and extends throughout her legs.  She does very little walking related to this.  She fortunately has had no lower extremity tissue loss.  She denies any arterial rest pain.  She reports that this was a relatively sudden onset several months ago and is now severely limiting to her.  She is a non-insulin-dependent diabetic.  She does not have any cardiac disease.  She has a very long history of cigarette abuse  Past Medical History:  Diagnosis Date  . Anxiety   . Asthma   . Depression   . Diabetes mellitus without complication (HCC)   . Hyperlipidemia   . Hypertension     Family History  Problem Relation Age of Onset  . Hypertension Mother   . Diabetes Mother   . COPD Father   . Heart disease Father [id number]       AMI x 3/CABG  . Diabetes Father   . Diabetes Sister   . Mental illness Sister        schizoaffective  . Diabetes Sister   . Arthritis Sister     SOCIAL HISTORY: Social History   Socioeconomic History  . Marital status: Single    Spouse name: Not on file  . Number of children: Not on file  . Years of education: Not on file  . Highest education level: Not on file  Occupational History  . Not on file  Social Needs  . Financial resource strain: Not on file  . Food insecurity:    Worry: Not on file    Inability: Not on file  .  Transportation needs:    Medical: Not on file    Non-medical: Not on file  Tobacco Use  . Smoking status: Current Every Day Smoker    Packs/day: 1.00  . Smokeless tobacco: Never Used  Substance and Sexual Activity  . Alcohol use: No  . Drug use: No  . Sexual activity: Not Currently  Lifestyle  . Physical activity:    Days per week: Not on file    Minutes per session: Not on file  . Stress: Not on file  Relationships  . Social connections:    Talks on phone: Not on file    Gets together: Not on file    Attends religious service: Not on file    Active member of club or organization: Not on file    Attends meetings of clubs or organizations: Not on file    Relationship status: Not on file  . Intimate partner violence:    Fear of current or ex partner: Not on file    Emotionally abused: Not on file    Physically abused: Not on file    Forced sexual activity: Not on file  Other Topics Concern  . Not on file  Social History Narrative   Marital status: single; not dating in 2018  Children: none      Lives: alone; 3 animals (cat, 2 dogs)      Employment:  Disability since 2004 due to depression/anxiety; followed by psychiatry.      Tobacco: 1 ppd x 30 years; quit once with patch      Alcohol: none      Drugs: none      Exercise: none in 2018      Seatbelt: 100%; no texting      ADLs: independent with ADLs; no assistant devices; drives      Advanced Directives:     Allergies  Allergen Reactions  . Lisinopril Cough    Muscle pain   . Penicillins Rash    Current Outpatient Medications  Medication Sig Dispense Refill  . acetaminophen (TYLENOL) 325 MG tablet Take 650 mg by mouth every 6 (six) hours as needed.    Marland Kitchen. atorvastatin (LIPITOR) 40 MG tablet Take 1 tablet (40 mg total) by mouth daily. 90 tablet 3  . buPROPion (WELLBUTRIN SR) 150 MG 12 hr tablet Take 1 tablet (150 mg total) by mouth 2 (two) times daily. 60 tablet 2  . hydrochlorothiazide (HYDRODIURIL) 25 MG  tablet Take 1 tablet (25 mg total) by mouth daily. 90 tablet 1  . metFORMIN (GLUCOPHAGE) 500 MG tablet Take 1 tablet (500 mg total) by mouth daily with breakfast. 90 tablet 1  . metoprolol tartrate (LOPRESSOR) 25 MG tablet Take 1 tablet (25 mg total) by mouth 2 (two) times daily. 180 tablet 1  . venlafaxine XR (EFFEXOR-XR) 150 MG 24 hr capsule TAKE 1 CAPSULE BY MOUTH EVERY DAY WITH BREAKFAST 90 capsule 1   No current facility-administered medications for this visit.     REVIEW OF SYSTEMS:  [X]  denotes positive finding, [ ]  denotes negative finding Cardiac  Comments:  Chest pain or chest pressure:    Shortness of breath upon exertion:    Short of breath when lying flat:    Irregular heart rhythm:        Vascular    Pain in calf, thigh, or hip brought on by ambulation: x   Pain in feet at night that wakes you up from your sleep:     Blood clot in your veins:    Leg swelling:         Pulmonary    Oxygen at home:    Productive cough:     Wheezing:         Neurologic    Sudden weakness in arms or legs:     Sudden numbness in arms or legs:     Sudden onset of difficulty speaking or slurred speech:    Temporary loss of vision in one eye:     Problems with dizziness:         Gastrointestinal    Blood in stool:     Vomited blood:         Genitourinary    Burning when urinating:     Blood in urine:        Psychiatric    Major depression:         Hematologic    Bleeding problems:    Problems with blood clotting too easily:        Skin    Rashes or ulcers:        Constitutional    Fever or chills:      PHYSICAL EXAM: Vitals:   04/04/18 1003  BP: (!) 137/98  Pulse: 96  Resp: 20  Temp: (!) 96.6 F (35.9 C)  TempSrc: Oral  SpO2: 98%  Weight: 137 lb (62.1 kg)  Height: 5\' 4"  (1.626 m)    GENERAL: The patient is a well-nourished female, in no acute distress. The vital signs are documented above. CARDIOVASCULAR: Carotid arteries without bruits bilaterally.  2+  radial pulses bilaterally.  I do not palpate pedal pulses bilaterally.  She does not have popliteal or distal pulses bilaterally. PULMONARY: There is good air exchange  ABDOMEN: Soft and non-tender.  No bruit noted MUSCULOSKELETAL: There are no major deformities or cyanosis. NEUROLOGIC: No focal weakness or paresthesias are detected. SKIN: There are no ulcers or rashes noted. PSYCHIATRIC: The patient has a normal affect.  DATA:  Lower extremity noninvasive arterial studies revealed an ankle arm index of 0.44 on the right and 0.37 on the left dampened monophasic waveforms bilaterally  MEDICAL ISSUES: Long discussion with the patient and her sister.  She has severe lower extremity arterial insufficiency that appears to be related to aortoiliac occlusive disease.  She may well have complete aortic occlusion.  Her history is that this had a relatively sudden onset.  She is not able to tolerate this level of ischemia.  I have recommended that we proceed with a CT angiogram for further evaluation.  I explained that if this does confirm aortoiliac occlusive disease, it is possible that she would be a candidate for endovascular treatment.  My suspicion is that this will be much more extensive and she may require aortobifemoral bypass.  I did discuss the magnitude of this procedure.  Also discussed that before any invasive procedure would require cardiac clearance.  Will coordinate outpatient CT of abdomen pelvis and runoff and see her back for discussion following this   Larina Earthly, MD [facility] Vascular and Vein Specialists of [facility] Tel [phone] Pager [phone]

## 2018-04-06 ENCOUNTER — Other Ambulatory Visit: Payer: Self-pay

## 2018-04-06 DIAGNOSIS — R0989 Other specified symptoms and signs involving the circulatory and respiratory systems: Secondary | ICD-10-CM

## 2018-04-06 DIAGNOSIS — I739 Peripheral vascular disease, unspecified: Secondary | ICD-10-CM

## 2018-04-12 ENCOUNTER — Ambulatory Visit
Admission: RE | Admit: 2018-04-12 | Discharge: 2018-04-12 | Disposition: A | Discharge status: Home / Self Care | Payer: Medicare Other | Source: Ambulatory Visit | Attending: Vascular Surgery | Admitting: Vascular Surgery

## 2018-04-12 DIAGNOSIS — I739 Peripheral vascular disease, unspecified: Secondary | ICD-10-CM | POA: Diagnosis not present

## 2018-04-12 DIAGNOSIS — I701 Atherosclerosis of renal artery: Secondary | ICD-10-CM | POA: Diagnosis not present

## 2018-04-12 DIAGNOSIS — R0989 Other specified symptoms and signs involving the circulatory and respiratory systems: Secondary | ICD-10-CM

## 2018-04-12 MED ORDER — IOPAMIDOL (ISOVUE-370) INJECTION 76%
125.0000 mL | Freq: Once | INTRAVENOUS | Status: AC | PRN
  Administered 2018-04-12: 125 mL via INTRAVENOUS

## 2018-04-13 ENCOUNTER — Ambulatory Visit: Payer: Medicare Other | Admitting: Family Medicine

## 2018-04-14 ENCOUNTER — Other Ambulatory Visit: Payer: Self-pay

## 2018-04-14 ENCOUNTER — Encounter

## 2018-04-14 ENCOUNTER — Ambulatory Visit (INDEPENDENT_AMBULATORY_CARE_PROVIDER_SITE_OTHER): Payer: Medicare Other | Admitting: Family Medicine

## 2018-04-14 ENCOUNTER — Encounter: Payer: Self-pay | Admitting: Family Medicine

## 2018-04-14 VITALS — BP 131/87 | HR 96 | Temp 98.0°F | Resp 16 | Ht 64.0 in | Wt 139.0 lb

## 2018-04-14 DIAGNOSIS — E119 Type 2 diabetes mellitus without complications: Secondary | ICD-10-CM

## 2018-04-14 DIAGNOSIS — F17200 Nicotine dependence, unspecified, uncomplicated: Secondary | ICD-10-CM

## 2018-04-14 DIAGNOSIS — R911 Solitary pulmonary nodule: Secondary | ICD-10-CM | POA: Diagnosis not present

## 2018-04-14 DIAGNOSIS — I739 Peripheral vascular disease, unspecified: Secondary | ICD-10-CM | POA: Diagnosis not present

## 2018-04-14 DIAGNOSIS — E785 Hyperlipidemia, unspecified: Secondary | ICD-10-CM | POA: Diagnosis not present

## 2018-04-14 DIAGNOSIS — I10 Essential (primary) hypertension: Secondary | ICD-10-CM

## 2018-04-14 MED ORDER — ATORVASTATIN CALCIUM 80 MG PO TABS: 80.0000 mg | ORAL_TABLET | Freq: Every day | ORAL | 3 refills | Status: AC

## 2018-04-14 NOTE — Progress Notes (Signed)
1/31/20208:47 AM  Gwendolyn Pham 02/05/1956, 63 y.o. female [id number]  Chief Complaint  Patient presents with  . Follow-up    chronic conditions  . Results    pt had a ct scan on 04/12/18    HPI:   Patient is a 63 y.o. female with past medical history significant for DM2, HTN, HLP, GERD, depression with anxiety who presents today for routine followup  Chart review Seen by vasc surg 10 days ago for concerns of claudication Plan was for CT angiogram to further evaluate, anticipate need for bypass - findings c/w PAD, incidental finding of 6mm pulmonary nodule, recommend repeat CT 6-12 months  Patient history She is overall doing well, a bit nervous about possible upcoming surgery  Sees vasc surg next week  Still smoking, 1/2 ppd She would like to try hypnotherapy She is not interested in chantix   Lab Results  Component Value Date   HGBA1C 6.4 (H) 01/23/2018   HGBA1C 6.4 (H) 08/03/2017   HGBA1C 6.6 (H) 01/31/2017   Lab Results  Component Value Date   MICROALBUR 0.2 06/18/2015   LDLCALC 109 (H) 01/23/2018   CREATININE 0.78 01/23/2018    Fall Risk  04/14/2018 01/23/2018 08/03/2017 01/31/2017 01/31/2017  Falls in the past year? 0 0 No Yes Yes  Number falls in past yr: 0 - - 1 1  Injury with Fall? 0 - - No -     Depression screen [facility] 2/9 04/14/2018 08/03/2017 01/31/2017  Decreased Interest 0 0 0  Down, Depressed, Hopeless 0 0 0  PHQ - 2 Score 0 0 0  Altered sleeping - - -  Tired, decreased energy - - -  Change in appetite - - -  Feeling bad or failure about yourself  - - -  Trouble concentrating - - -  Moving slowly or fidgety/restless - - -  Suicidal thoughts - - -  PHQ-9 Score - - -  Difficult doing work/chores - - -    Allergies  Allergen Reactions  . Lisinopril Cough    Muscle pain   . Penicillins Rash    Prior to Admission medications   Medication Sig Start Date End Date Taking? Authorizing Provider  acetaminophen (TYLENOL) 325 MG tablet Take 650  mg by mouth every 6 (six) hours as needed.   Yes [provider]  atorvastatin (LIPITOR) 40 MG tablet Take 1 tablet (40 mg total) by mouth daily. 01/28/18  Yes Myles LippsSantiago, Niema Carrara M, MD  buPROPion [facility](WELLBUTRIN SR) 150 MG 12 hr tablet Take 1 tablet (150 mg total) by mouth 2 (two) times daily. 01/23/18  Yes Myles LippsSantiago, Riki Berninger M, MD  hydrochlorothiazide (HYDRODIURIL) 25 MG tablet Take 1 tablet (25 mg total) by mouth daily. 01/23/18  Yes Myles LippsSantiago, Delsy Etzkorn M, MD  metFORMIN (GLUCOPHAGE) 500 MG tablet Take 1 tablet (500 mg total) by mouth daily with breakfast. 01/23/18  Yes Myles LippsSantiago, Sahithi Ordoyne M, MD  metoprolol tartrate (LOPRESSOR) 25 MG tablet Take 1 tablet (25 mg total) by mouth 2 (two) times daily. 01/23/18  Yes Myles LippsSantiago, Tacara Hadlock M, MD  venlafaxine XR (EFFEXOR-XR) 150 MG 24 hr capsule TAKE 1 CAPSULE BY MOUTH EVERY DAY WITH BREAKFAST 02/10/18  Yes Myles LippsSantiago, Nephi Savage M, MD    Past Medical History:  Diagnosis Date  . Anxiety   . Asthma   . Depression   . Diabetes mellitus without complication (HCC)   . Hyperlipidemia   . Hypertension     Past Surgical History:  Procedure Laterality Date  . DILATION AND CURETTAGE  OF UTERUS    . INNER EAR SURGERY      Social History   Tobacco Use  . Smoking status: Current Every Day Smoker    Packs/day: 1.00  . Smokeless tobacco: Never Used  Substance Use Topics  . Alcohol use: No    Family History  Problem Relation Age of Onset  . Hypertension Mother   . Diabetes Mother   . COPD Father   . Heart disease Father [id number]       AMI x 3/CABG  . Diabetes Father   . Diabetes Sister   . Mental illness Sister        schizoaffective  . Diabetes Sister   . Arthritis Sister     ROS Per hpi No CP, palpitations, continued claudication No cough or SOB  OBJECTIVE:  Blood pressure 131/87, pulse 96, temperature 98 F (36.7 C), temperature source Oral, resp. rate 16, height 5\' 4"  (1.626 m), weight 139 lb (63 kg), SpO2 96 %. Body mass index is 23.86 kg/m.   Physical  Exam Vitals signs and nursing note reviewed.  Constitutional:      Appearance: She is well-developed.  HENT:     Head: Normocephalic and atraumatic.  Eyes:     General: No scleral icterus.    Conjunctiva/sclera: Conjunctivae normal.     Pupils: Pupils are equal, round, and reactive to light.  Neck:     Musculoskeletal: Neck supple.  Pulmonary:     Effort: Pulmonary effort is normal.  Skin:    General: Skin is warm and dry.  Neurological:     Mental Status: She is alert and oriented to person, place, and time.      ASSESSMENT and PLAN  1. PAD (peripheral artery disease) (HCC) Discussed importance of smoking cessation. LDL < 70. Keep upcoming appt with vas surg  2. Tobacco use disorder Provided info for hypnotherapy. Discussed patched and lozengers.   3. Hyperlipidemia, unspecified hyperlipidemia type LDL goal < 70. Increasing atorvastatin. Recheck in 6 weeks - atorvastatin (LIPITOR) 80 MG tablet; Take 1 tablet (80 mg total) by mouth daily. - Comprehensive metabolic panel; Future - Lipid panel; Future  4. Type 2 diabetes mellitus without complication, without long-term current use of insulin (HCC) Controlled. Continue current regime.   5. HTN, goal below 140/90 Controlled. Continue current regime.   6. Solitary pulmonary nodule Repeat non contrast chest CT in 6-12 months  Return in about 6 weeks (around 05/26/2018) for smoking, cholesterol.    Myles LippsIrma M Santiago, MD [facility] [address] [place], [place] [address] Ph.  [phone] Fax [phone]

## 2018-04-14 NOTE — Patient Instructions (Addendum)
Patch 14mg  a day lozengers  [employer] [facility] [address], [place]  [phone]   Coping with Quitting Smoking  Quitting smoking is a physical and mental challenge. You will face cravings, withdrawal symptoms, and temptation. Before quitting, work with your health care provider to make a plan that can help you cope. Preparation can help you quit and keep you from giving in. How can I cope with cravings? Cravings usually last for 5-10 minutes. If you get through it, the craving will pass. Consider taking the following actions to help you cope with cravings:  Keep your mouth busy: ? Chew sugar-free gum. ? Suck on hard candies or a straw. ? Brush your teeth.  Keep your hands and body busy: ? Immediately change to a different activity when you feel a craving. ? Squeeze or play with a ball. ? Do an activity or a hobby, like making bead jewelry, practicing needlepoint, or working with wood. ? Mix up your normal routine. ? Take a short exercise break. Go for a quick walk or run up and down stairs. ? Spend time in public places where smoking is not allowed.  Focus on doing something kind or helpful for someone else.  Call a friend or family member to talk during a craving.  Join a support group.  Call a quit line, such as 1-800-QUIT-NOW.  Talk with your health care provider about medicines that might help you cope with cravings and make quitting easier for you. How can I deal with withdrawal symptoms? Your body may experience negative effects as it tries to get used to not having nicotine in the system. These effects are called withdrawal symptoms. They may include:  Feeling hungrier than normal.  Trouble concentrating.  Irritability.  Trouble sleeping.  Feeling depressed.  Restlessness and agitation.  Craving a cigarette. To manage withdrawal symptoms:  Avoid places, people, and activities that trigger your cravings.  Remember  why you want to quit.  Get plenty of sleep.  Avoid coffee and other caffeinated drinks. These may worsen some of your symptoms. How can I handle social situations? Social situations can be difficult when you are quitting smoking, especially in the first few weeks. To manage this, you can:  Avoid parties, bars, and other social situations where people might be smoking.  Avoid alcohol.  Leave right away if you have the urge to smoke.  Explain to your family and friends that you are quitting smoking. Ask for understanding and support.  Plan activities with friends or family where smoking is not an option. What are some ways I can cope with stress? Wanting to smoke may cause stress, and stress can make you want to smoke. Find ways to manage your stress. Relaxation techniques can help. For example:  Breathe slowly and deeply, in through your nose and out through your mouth.  Listen to soothing, relaxing music.  Talk with a family member or friend about your stress.  Light a candle.  Soak in a bath or take a shower.  Think about a peaceful place. What are some ways I can prevent weight gain? Be aware that many people gain weight after they quit smoking. However, not everyone does. To keep from gaining weight, have a plan in place before you quit and stick to the plan after you quit. Your plan should include:  Having healthy snacks. When you have a craving, it may help to: ? Eat plain popcorn, crunchy carrots, celery, or other cut vegetables. ?  Chew sugar-free gum.  Changing how you eat: ? Eat small portion sizes at meals. ? Eat 4-6 small meals throughout the day instead of 1-2 large meals a day. ? Be mindful when you eat. Do not watch television or do other things that might distract you as you eat.  Exercising regularly: ? Make time to exercise each day. If you do not have time for a long workout, do short bouts of exercise for 5-10 minutes several times a day. ? Do some form  of strengthening exercise, like weight lifting, and some form of aerobic exercise, like running or swimming.  Drinking plenty of water or other low-calorie or no-calorie drinks. Drink 6-8 glasses of water daily, or as much as instructed by your health care provider. Summary  Quitting smoking is a physical and mental challenge. You will face cravings, withdrawal symptoms, and temptation to smoke again. Preparation can help you as you go through these challenges.  You can cope with cravings by keeping your mouth busy (such as by chewing gum), keeping your body and hands busy, and making calls to family, friends, or a helpline for people who want to quit smoking.  You can cope with withdrawal symptoms by avoiding places where people smoke, avoiding drinks with caffeine, and getting plenty of rest.  Ask your health care provider about the different ways to prevent weight gain, avoid stress, and handle social situations. This information is not intended to replace advice given to you by your health care provider. Make sure you discuss any questions you have with your health care provider. Document Released: 02/27/2016 Document Revised: 02/27/2016 Document Reviewed: 02/27/2016 Elsevier Interactive Patient Education  [employer]2019 Elsevier Inc.  If you have lab work done today you will be contacted with your lab results within the next 2 weeks.  If you have not heard from us then please contact us. The fastest way to get your results is to register for My Chart.   IF you received an x-ray today, you will receive an invoice from [facility]. Please contact [facility] at [phone] with questions or concerns regarding your invoice.   IF you received labwork today, you will receive an invoice from [facility]. Please contact [facility] at [phone] with questions or concerns regarding your invoice.   Our billing staff will not be able to assist you with questions regarding bills from these  companies.  You will be contacted with the lab results as soon as they are available. The fastest way to get your results is to activate your My Chart account. Instructions are located on the last page of this paperwork. If you have not heard from us regarding the results in 2 weeks, please contact this office.

## 2018-04-18 ENCOUNTER — Other Ambulatory Visit: Payer: Self-pay | Admitting: *Deleted

## 2018-04-18 ENCOUNTER — Other Ambulatory Visit: Payer: Self-pay

## 2018-04-18 ENCOUNTER — Encounter: Payer: Self-pay | Admitting: *Deleted

## 2018-04-18 ENCOUNTER — Ambulatory Visit (INDEPENDENT_AMBULATORY_CARE_PROVIDER_SITE_OTHER): Payer: Medicare Other | Admitting: Vascular Surgery

## 2018-04-18 ENCOUNTER — Encounter: Payer: Self-pay | Admitting: Vascular Surgery

## 2018-04-18 VITALS — BP 151/93 | HR 66 | Temp 97.5°F | Resp 14 | Ht 64.0 in | Wt 138.0 lb

## 2018-04-18 DIAGNOSIS — I739 Peripheral vascular disease, unspecified: Secondary | ICD-10-CM

## 2018-04-18 NOTE — H&P (View-Only) (Signed)
  V[facility] Specialist of [place]  Patient name: Gwendolyn Pham MRN: [id number] DOB: 05/09/1955 Sex: female  REASON FOR VISIT: Follow-up peripheral vascular disease for discussion of CT angiogram  HPI: Gwendolyn Pham is a 62 y.o. female here today for follow-up of her peripheral vascular disease and discussion of CT angiogram.  Here today with her sister.  I had seen her initially several weeks ago.  I felt at that time that in all likelihood from a clinical standpoint that she had an occluded aorta.  She underwent CT angiogram for further evaluation.  She has no tissue loss but has severe bilateral lower extremity claudication.  Past Medical History:  Diagnosis Date  . Anxiety   . Asthma   . Depression   . Diabetes mellitus without complication (HCC)   . Hyperlipidemia   . Hypertension     Family History  Problem Relation Age of Onset  . Hypertension Mother   . Diabetes Mother   . COPD Father   . Heart disease Father 45       AMI x 3/CABG  . Diabetes Father   . Diabetes Sister   . Mental illness Sister        schizoaffective  . Diabetes Sister   . Arthritis Sister     SOCIAL HISTORY: Social History   Tobacco Use  . Smoking status: Current Every Day Smoker    Packs/day: 1.00  . Smokeless tobacco: Never Used  Substance Use Topics  . Alcohol use: No    Allergies  Allergen Reactions  . Lisinopril Cough    Muscle pain   . Penicillins Rash    Current Outpatient Medications  Medication Sig Dispense Refill  . acetaminophen (TYLENOL) 325 MG tablet Take 650 mg by mouth every 6 (six) hours as needed.    . atorvastatin (LIPITOR) 80 MG tablet Take 1 tablet (80 mg total) by mouth daily. 90 tablet 3  . buPROPion (WELLBUTRIN SR) 150 MG 12 hr tablet Take 1 tablet (150 mg total) by mouth 2 (two) times daily. 60 tablet 2  . hydrochlorothiazide (HYDRODIURIL) 25 MG tablet Take 1 tablet (25 mg total) by mouth daily. 90 tablet 1  .  metFORMIN (GLUCOPHAGE) 500 MG tablet Take 1 tablet (500 mg total) by mouth daily with breakfast. 90 tablet 1  . metoprolol tartrate (LOPRESSOR) 25 MG tablet Take 1 tablet (25 mg total) by mouth 2 (two) times daily. 180 tablet 1  . venlafaxine XR (EFFEXOR-XR) 150 MG 24 hr capsule TAKE 1 CAPSULE BY MOUTH EVERY DAY WITH BREAKFAST 90 capsule 1   No current facility-administered medications for this visit.     REVIEW OF SYSTEMS:  [X] denotes positive finding, [ ] denotes negative finding Cardiac  Comments:  Chest pain or chest pressure:    Shortness of breath upon exertion: x   Short of breath when lying flat:    Irregular heart rhythm:        Vascular    Pain in calf, thigh, or hip brought on by ambulation: x   Pain in feet at night that wakes you up from your sleep:     Blood clot in your veins:    Leg swelling:           PHYSICAL EXAM: Vitals:   04/18/18 1030  BP: (!) 151/93  Pulse: 66  Resp: 14  Temp: (!) 97.5 F (36.4 C)  TempSrc: Oral  SpO2: 98%  Weight: 138 lb (62.6 kg)  Height: 5'   4" (1.626 m)    GENERAL: The patient is a well-nourished female, in no acute distress. The vital signs are documented above. CARDIOVASCULAR: Absent pedal pulses PULMONARY: There is good air exchange  MUSCULOSKELETAL: There are no major deformities or cyanosis. NEUROLOGIC: No focal weakness or paresthesias are detected. SKIN: There are no ulcers or rashes noted. PSYCHIATRIC: The patient has a normal affect.  DATA:  CT angiogram was discussed with the patient and reviewed the actual films with her.  This does show occlusion of her aorta below the level of the inferior mesenteric artery takeoff.  She has occlusion of her right common and external iliac and left common and external iliac artery.  She has reconstitution of the femoral arteries bilaterally.  Fortunately she does have patency of her superficial femoral, popliteal and tibial vessels bilaterally  MEDICAL ISSUES: Severe lower  extremity arterial insufficiency related to aortic and iliac occlusion.  Splane that her anatomy does not allow for endovascular treatment option.  Explained that her revascularization would require aortobifemoral bypass.  I explained the magnitude of the procedure with an expected 2-day intensive care unit stay and a 5 to 7-day hospitalization.  Also explained it would take several weeks to several months for fully recover following surgery.  She is unable to tolerate this level of severe ischemia and wishes to proceed.  We will schedule her for cardiac evaluation to rule out any significant cardiac disease.  Otherwise she is ready for surgical scheduling    Larina Earthly, MD [facility] Tel [phone] Pager [phone]

## 2018-04-18 NOTE — Progress Notes (Signed)
Vascular and Vein Specialist of [place]  Patient name: Gwendolyn HutchingLaura N Degracia MRN: [id number] DOB: 05/27/1955 Sex: female  REASON FOR VISIT: Follow-up peripheral vascular disease for discussion of CT angiogram  HPI: Gwendolyn HutchingLaura N Rodman is a 63 y.o. female here today for follow-up of her peripheral vascular disease and discussion of CT angiogram.  Here today with her sister.  I had seen her initially several weeks ago.  I felt at that time that in all likelihood from a clinical standpoint that she had an occluded aorta.  She underwent CT angiogram for further evaluation.  She has no tissue loss but has severe bilateral lower extremity claudication.  Past Medical History:  Diagnosis Date  . Anxiety   . Asthma   . Depression   . Diabetes mellitus without complication (HCC)   . Hyperlipidemia   . Hypertension     Family History  Problem Relation Age of Onset  . Hypertension Mother   . Diabetes Mother   . COPD Father   . Heart disease Father 2945       AMI x 3/CABG  . Diabetes Father   . Diabetes Sister   . Mental illness Sister        schizoaffective  . Diabetes Sister   . Arthritis Sister     SOCIAL HISTORY: Social History   Tobacco Use  . Smoking status: Current Every Day Smoker    Packs/day: 1.00  . Smokeless tobacco: Never Used  Substance Use Topics  . Alcohol use: No    Allergies  Allergen Reactions  . Lisinopril Cough    Muscle pain   . Penicillins Rash    Current Outpatient Medications  Medication Sig Dispense Refill  . acetaminophen (TYLENOL) 325 MG tablet Take 650 mg by mouth every 6 (six) hours as needed.    Marland Kitchen. atorvastatin (LIPITOR) 80 MG tablet Take 1 tablet (80 mg total) by mouth daily. 90 tablet 3  . buPROPion (WELLBUTRIN SR) 150 MG 12 hr tablet Take 1 tablet (150 mg total) by mouth 2 (two) times daily. 60 tablet 2  . hydrochlorothiazide (HYDRODIURIL) 25 MG tablet Take 1 tablet (25 mg total) by mouth daily. 90 tablet 1  .  metFORMIN (GLUCOPHAGE) 500 MG tablet Take 1 tablet (500 mg total) by mouth daily with breakfast. 90 tablet 1  . metoprolol tartrate (LOPRESSOR) 25 MG tablet Take 1 tablet (25 mg total) by mouth 2 (two) times daily. 180 tablet 1  . venlafaxine XR (EFFEXOR-XR) 150 MG 24 hr capsule TAKE 1 CAPSULE BY MOUTH EVERY DAY WITH BREAKFAST 90 capsule 1   No current facility-administered medications for this visit.     REVIEW OF SYSTEMS:  [X]  denotes positive finding, [ ]  denotes negative finding Cardiac  Comments:  Chest pain or chest pressure:    Shortness of breath upon exertion: x   Short of breath when lying flat:    Irregular heart rhythm:        Vascular    Pain in calf, thigh, or hip brought on by ambulation: x   Pain in feet at night that wakes you up from your sleep:     Blood clot in your veins:    Leg swelling:           PHYSICAL EXAM: Vitals:   04/18/18 1030  BP: (!) 151/93  Pulse: 66  Resp: 14  Temp: (!) 97.5 F (36.4 C)  TempSrc: Oral  SpO2: 98%  Weight: 138 lb (62.6 kg)  Height: 5'  4" (1.626 m)    GENERAL: The patient is a well-nourished female, in no acute distress. The vital signs are documented above. CARDIOVASCULAR: Absent pedal pulses PULMONARY: There is good air exchange  MUSCULOSKELETAL: There are no major deformities or cyanosis. NEUROLOGIC: No focal weakness or paresthesias are detected. SKIN: There are no ulcers or rashes noted. PSYCHIATRIC: The patient has a normal affect.  DATA:  CT angiogram was discussed with the patient and reviewed the actual films with her.  This does show occlusion of her aorta below the level of the inferior mesenteric artery takeoff.  She has occlusion of her right common and external iliac and left common and external iliac artery.  She has reconstitution of the femoral arteries bilaterally.  Fortunately she does have patency of her superficial femoral, popliteal and tibial vessels bilaterally  MEDICAL ISSUES: Severe lower  extremity arterial insufficiency related to aortic and iliac occlusion.  Splane that her anatomy does not allow for endovascular treatment option.  Explained that her revascularization would require aortobifemoral bypass.  I explained the magnitude of the procedure with an expected 2-day intensive care unit stay and a 5 to 7-day hospitalization.  Also explained it would take several weeks to several months for fully recover following surgery.  She is unable to tolerate this level of severe ischemia and wishes to proceed.  We will schedule her for cardiac evaluation to rule out any significant cardiac disease.  Otherwise she is ready for surgical scheduling    Larina Earthly, MD [facility] Tel [phone] Pager [phone]

## 2018-04-20 ENCOUNTER — Ambulatory Visit (INDEPENDENT_AMBULATORY_CARE_PROVIDER_SITE_OTHER): Payer: Medicare Other | Admitting: Cardiology

## 2018-04-20 ENCOUNTER — Encounter: Payer: Self-pay | Admitting: Cardiology

## 2018-04-20 VITALS — BP 150/88 | HR 79 | Ht 64.0 in | Wt 136.9 lb

## 2018-04-20 DIAGNOSIS — Z01810 Encounter for preprocedural cardiovascular examination: Secondary | ICD-10-CM

## 2018-04-20 DIAGNOSIS — I739 Peripheral vascular disease, unspecified: Secondary | ICD-10-CM | POA: Diagnosis not present

## 2018-04-20 MED ORDER — ASPIRIN EC 81 MG PO TBEC: 81.0000 mg | DELAYED_RELEASE_TABLET | Freq: Every day | ORAL | 3 refills | Status: AC

## 2018-04-20 NOTE — Progress Notes (Deleted)
63 y/o Caucasian female with hypertension, hyperlipidemia, type 2 DM, anxiety, depression, now diagnosed with severe PAD with occlusion of aorta below the level of inferior mesenteric takeoff, with reconstitution at the level of b/l femorals. She is seeing Dr. Tawanna Coolerodd Early and being considered for aortobifemoral bypass. She is here today for pre-operative cardiac evaluation.   EKG 04/20/2018: Sinus  Rhythm  Nonspecific ST-T anteroseptal leads.   Gwendolyn Pham, sister.  Lives alone with her pets. Sister lives 10 miles. Severe claudication.   Disabled from severe depression. Ontreatment.  HTN, COPD, HLD, DM     Patient referred by Myles LippsSantiago, Irma M for ***  Subjective:   @Patient  ID: Gwendolyn Pham, female    DOB: [birth year], 63 y.o.   MRN: [id number]  Chief Complaint  Patient presents with  . New Patient (Initial Visit)    For Pre-Op    HPI  Past Medical History:  Diagnosis Date  . Anxiety   . Asthma   . Depression   . Diabetes mellitus without complication (HCC)   . Hyperlipidemia   . Hypertension     Past Surgical History:  Procedure Laterality Date  . DILATION AND CURETTAGE OF UTERUS    . INNER EAR SURGERY      Social History   Socioeconomic History  . Marital status: Single    Spouse name: Not on file  . Number of children: Not on file  . Years of education: Not on file  . Highest education level: Not on file  Occupational History  . Not on file  Social Needs  . Financial resource strain: Not on file  . Food insecurity:    Worry: Not on file    Inability: Not on file  . Transportation needs:    Medical: Not on file    Non-medical: Not on file  Tobacco Use  . Smoking status: Current Every Day Smoker    Packs/day: 1.50    Years: 50.00    Pack years: 75.00    Types: Cigarettes  . Smokeless tobacco: Never Used  Substance and Sexual Activity  . Alcohol use: No  . Drug use: No  . Sexual activity: Not Currently  Lifestyle  . Physical activity:   Days per week: Not on file    Minutes per session: Not on file  . Stress: Not on file  Relationships  . Social connections:    Talks on phone: Not on file    Gets together: Not on file    Attends religious service: Not on file    Active member of club or organization: Not on file    Attends meetings of clubs or organizations: Not on file    Relationship status: Not on file  . Intimate partner violence:    Fear of current or ex partner: Not on file    Emotionally abused: Not on file    Physically abused: Not on file    Forced sexual activity: Not on file  Other Topics Concern  . Not on file  Social History Narrative   Marital status: single; not dating in 2018      Children: none      Lives: alone; 3 animals (cat, 2 dogs)      Employment:  Disability since 2004 due to depression/anxiety; followed by psychiatry.      Tobacco: 1 ppd x 30 years; quit once with patch      Alcohol: none      Drugs: none      Exercise:  none in 2018      Seatbelt: 100%; no texting      ADLs: independent with ADLs; no assistant devices; drives      Advanced Directives:     Current Outpatient Medications on File Prior to Visit  Medication Sig Dispense Refill  . acetaminophen (TYLENOL) 325 MG tablet Take 650 mg by mouth every 6 (six) hours as needed.    Marland Kitchen atorvastatin (LIPITOR) 80 MG tablet Take 1 tablet (80 mg total) by mouth daily. (Patient taking differently: Take 40 mg by mouth daily. ) 90 tablet 3  . buPROPion (WELLBUTRIN SR) 150 MG 12 hr tablet Take 1 tablet (150 mg total) by mouth 2 (two) times daily. 60 tablet 2  . Fluticasone-Salmeterol (ADVAIR) 100-50 MCG/DOSE AEPB Inhale 1 puff into the lungs 2 (two) times daily.    . hydrochlorothiazide (HYDRODIURIL) 25 MG tablet Take 1 tablet (25 mg total) by mouth daily. 90 tablet 1  . metFORMIN (GLUCOPHAGE) 500 MG tablet Take 1 tablet (500 mg total) by mouth daily with breakfast. 90 tablet 1  . metoprolol tartrate (LOPRESSOR) 25 MG tablet Take 1 tablet (25  mg total) by mouth 2 (two) times daily. 180 tablet 1  . venlafaxine XR (EFFEXOR-XR) 150 MG 24 hr capsule TAKE 1 CAPSULE BY MOUTH EVERY DAY WITH BREAKFAST 90 capsule 1   No current facility-administered medications on file prior to visit.     Cardiovascular studies:  ***  Review of Systems  Constitution: Negative for decreased appetite, malaise/fatigue, weight gain and weight loss.  Eyes: Negative for visual disturbance.  Cardiovascular: Positive for claudication. Negative for chest pain, dyspnea on exertion, leg swelling and syncope.  Respiratory: Negative for shortness of breath.   Endocrine: Negative for cold intolerance.  Hematologic/Lymphatic: Does not bruise/bleed easily.  Skin: Negative for itching and rash.  Musculoskeletal: Negative for myalgias.  Gastrointestinal: Negative for abdominal pain, nausea and vomiting.  Genitourinary: Negative for dysuria.  Neurological: Negative for dizziness and weakness.  Psychiatric/Behavioral: The patient is not nervous/anxious.   All other systems reviewed and are negative.       Vitals:   04/20/18 1229  BP: (!) 150/88  Pulse: 79  SpO2: 96%    Objective:   Physical Exam  Constitutional: She is oriented to person, place, and time. She appears well-developed and well-nourished. No distress.  HENT:  Head: Normocephalic and atraumatic.  Eyes: Pupils are equal, round, and reactive to light. Conjunctivae are normal.  Neck: No JVD present.  Cardiovascular: Normal rate and regular rhythm.  Pulmonary/Chest: Effort normal and breath sounds normal. She has no wheezes. She has no rales.  Abdominal: Soft. Bowel sounds are normal. There is no rebound.  Musculoskeletal:        General: No edema.  Lymphadenopathy:    She has no cervical adenopathy.  Neurological: She is alert and oriented to person, place, and time. No cranial nerve deficit.  Skin: Skin is warm and dry.  Psychiatric: She has a normal mood and affect.  Nursing note and  vitals reviewed.         Assessment & Recommendations:     ICD-10-CM   1. Encounter for pre-operative cardiovascular clearance Z01.810 EKG 12-Lead  2. PAD (peripheral artery disease) (HCC) I73.9 aspirin EC 81 MG tablet     Thank you for referring the patient to [place]. Please feel free to contact with any questions.  Elder Negus, MD [facility] Pager: [phone] Office: [phone] If no answer Cell [phone]

## 2018-04-20 NOTE — Progress Notes (Signed)
Patient referred by Early, Kristen Loader, MD for Pre-op cardiac evaluation.   Subjective:   @Patient  ID: Gwendolyn Pham, female    DOB: May 01, 1955, 63 y.o.   MRN: [id number]  Chief Complaint  Patient presents with  . New Patient (Initial Visit)    For Pre-Op    HPI   63 y/o Caucasian female with hypertension, hyperlipidemia, tobacco abuse, type 2 DM, anxiety, depression, PTSD, now diagnosed with severe PAD with occlusion of aorta below the level of inferior mesenteric takeoff, with reconstitution at the level of b/l femorals. She is seeing Dr. Tawanna Cooler Early and being considered for aortobifemoral bypass. She is here today for pre-operative cardiac evaluation.   Patient is here today with her sister Gwendolyn Pham. Patient lives alone with her pet cat and dog. She has had worsening bilateral caludication and numbness in lower extremities that led to her seeing Dr. Arbie Cookey and the eventual diagnosis of severe PAD. Her ambulation is severely limited at this time due to claudication symptoms. With her limited level of activity, she denies chest pain, palpitations, leg edema, orthopnea, PND, TIA/syncope. She has stable shortness of breath attributed to her COPD. Patient has had sigificant problems from her depression, PTSD, but fortunately sees a provider for this and is on stable medical management and denies any suicidal ideation. She is a current 53 PY smoker. She has insight about her addiction and is trying Welbutrin to help her quit.    Past Medical History:  Diagnosis Date  . Anxiety   . Asthma   . Depression   . Diabetes mellitus without complication (HCC)   . Hyperlipidemia   . Hypertension     Past Surgical History:  Procedure Laterality Date  . DILATION AND CURETTAGE OF UTERUS    . INNER EAR SURGERY      Social History   Socioeconomic History  . Marital status: Single    Spouse name: Not on file  . Number of children: Not on file  . Years of education: Not on file  . Highest  education level: Not on file  Occupational History  . Not on file  Social Needs  . Financial resource strain: Not on file  . Food insecurity:    Worry: Not on file    Inability: Not on file  . Transportation needs:    Medical: Not on file    Non-medical: Not on file  Tobacco Use  . Smoking status: Current Every Day Smoker    Packs/day: 1.50    Years: 50.00    Pack years: 75.00    Types: Cigarettes  . Smokeless tobacco: Never Used  Substance and Sexual Activity  . Alcohol use: No  . Drug use: No  . Sexual activity: Not Currently  Lifestyle  . Physical activity:    Days per week: Not on file    Minutes per session: Not on file  . Stress: Not on file  Relationships  . Social connections:    Talks on phone: Not on file    Gets together: Not on file    Attends religious service: Not on file    Active member of club or organization: Not on file    Attends meetings of clubs or organizations: Not on file    Relationship status: Not on file  . Intimate partner violence:    Fear of current or ex partner: Not on file    Emotionally abused: Not on file    Physically abused: Not on file  Forced sexual activity: Not on file  Other Topics Concern  . Not on file  Social History Narrative   Marital status: single; not dating in 2018      Children: none      Lives: alone; 3 animals (cat, 2 dogs)      Employment:  Disability since 2004 due to depression/anxiety; followed by psychiatry.      Tobacco: 1 ppd x 30 years; quit once with patch      Alcohol: none      Drugs: none      Exercise: none in 2018      Seatbelt: 100%; no texting      ADLs: independent with ADLs; no assistant devices; drives      Advanced Directives:     Current Outpatient Medications on File Prior to Visit  Medication Sig Dispense Refill  . acetaminophen (TYLENOL) 325 MG tablet Take 650 mg by mouth every 6 (six) hours as needed.    Marland Kitchen. atorvastatin (LIPITOR) 80 MG tablet Take 1 tablet (80 mg total) by mouth  daily. (Patient taking differently: Take 40 mg by mouth daily. ) 90 tablet 3  . buPROPion (WELLBUTRIN SR) 150 MG 12 hr tablet Take 1 tablet (150 mg total) by mouth 2 (two) times daily. 60 tablet 2  . Fluticasone-Salmeterol (ADVAIR) 100-50 MCG/DOSE AEPB Inhale 1 puff into the lungs 2 (two) times daily.    . hydrochlorothiazide (HYDRODIURIL) 25 MG tablet Take 1 tablet (25 mg total) by mouth daily. 90 tablet 1  . metFORMIN (GLUCOPHAGE) 500 MG tablet Take 1 tablet (500 mg total) by mouth daily with breakfast. 90 tablet 1  . metoprolol tartrate (LOPRESSOR) 25 MG tablet Take 1 tablet (25 mg total) by mouth 2 (two) times daily. 180 tablet 1  . venlafaxine XR (EFFEXOR-XR) 150 MG 24 hr capsule TAKE 1 CAPSULE BY MOUTH EVERY DAY WITH BREAKFAST 90 capsule 1   No current facility-administered medications on file prior to visit.      Review of Systems  Constitution: Negative for decreased appetite, malaise/fatigue, weight gain and weight loss.  HENT: Negative for congestion.   Eyes: Negative for visual disturbance.  Cardiovascular: Positive for claudication. Negative for chest pain, leg swelling and syncope.  Respiratory: Positive for shortness of breath.   Endocrine: Negative for cold intolerance.  Hematologic/Lymphatic: Does not bruise/bleed easily.  Skin: Negative for itching and rash.  Musculoskeletal: Positive for back pain. Negative for myalgias.  Gastrointestinal: Negative for abdominal pain, nausea and vomiting.  Genitourinary: Negative for dysuria.  Neurological: Negative for dizziness and weakness.  Psychiatric/Behavioral: Positive for depression and substance abuse (Tobacco abuse). Negative for suicidal ideas. The patient is not nervous/anxious.   All other systems reviewed and are negative.       Vitals:   04/20/18 1229  BP: (!) 150/88  Pulse: 79  SpO2: 96%    Objective:   Physical Exam  Constitutional: She is oriented to person, place, and time. She appears well-developed and  well-nourished. No distress.  HENT:  Head: Normocephalic and atraumatic.  Eyes: Pupils are equal, round, and reactive to light. Conjunctivae are normal.  Neck: No JVD present.  Cardiovascular: Normal rate and regular rhythm.  Pulses:      Femoral pulses are 1+ on the right side and 1+ on the left side.      Popliteal pulses are 0 on the right side and 0 on the left side.       Dorsalis pedis pulses are 0 on the right  side and 0 on the left side.       Posterior tibial pulses are 0 on the right side and 0 on the left side.  Abdominal bruit  Pulmonary/Chest: Effort normal and breath sounds normal. She has no wheezes. She has no rales.  Abdominal: Soft. Bowel sounds are normal. There is no rebound.  Musculoskeletal:        General: No edema.  Lymphadenopathy:    She has no cervical adenopathy.  Neurological: She is alert and oriented to person, place, and time. No cranial nerve deficit.  Skin: Skin is warm and dry.  Loss of hair in bilateral lower extremities. Chronic ischemic changes.  Psychiatric: She has a normal mood and affect.  Nursing note and vitals reviewed.         Assessment & Recommendations:   Encounter for pre-operative cardiovascular clearance -  EKG 04/20/2018: Sinus  Rhythm  Nonspecific ST-T anteroseptal leads.  High risk vascular surgery in a patient with risk factors for CAD and known PAD. Given her limited functional capacity, recommend pharmacological stress test for risk stratification. Unless high risk findings, may proceed with aortobifemoral bypass surgery. Added Aspirin 81 mg daily. Continue lipitor 80 mg daily.   Hypertension: Suboptimal control. Defer further aggressive management until after surgery.  PAD:  Planned aortobifemoral bypass surgery with Dr. Arbie Cookey  Tobacco abuse: Emphasized tobacco cessation.  I will see the patient on as needed basis, unless significant findings found on stress testing.   Thank you for referring the patient to  [place]. Please feel free to contact with any questions.  Elder Negus, MD [facility] Pager: [phone] Office: [phone] If no answer Cell [phone]

## 2018-04-24 ENCOUNTER — Ambulatory Visit: Payer: Medicare Other

## 2018-04-24 DIAGNOSIS — Z01810 Encounter for preprocedural cardiovascular examination: Secondary | ICD-10-CM

## 2018-04-24 DIAGNOSIS — I739 Peripheral vascular disease, unspecified: Secondary | ICD-10-CM | POA: Diagnosis not present

## 2018-04-25 ENCOUNTER — Telehealth: Payer: Self-pay

## 2018-04-25 NOTE — Telephone Encounter (Signed)
I have left message for patient to call back to schedule New Patient Appt

## 2018-04-27 ENCOUNTER — Ambulatory Visit: Payer: Medicare Other | Admitting: Family Medicine

## 2018-05-01 NOTE — Pre-Procedure Instructions (Signed)
RAKYAH PORTA  05/01/2018      [id number] - [place], [place] - [address] AT [address] OF [facility] [address] [place] [address] Phone: [phone] Fax: [phone]    Your procedure is scheduled on February 26  Report to [facility] Admitting at 0530 A.M.  Call this number if you have problems the morning of surgery:  [phone]   Remember:  Do not eat or drink after midnight.    Take these medicines the morning of surgery with A SIP OF WATER  acetaminophen (TYLENOL)  buPROPion (WELLBUTRIN SR) Fluticasone-Salmeterol (ADVAIR) metoprolol tartrate (LOPRESSOR) venlafaxine XR (EFFEXOR-XR)  7 days prior to surgery STOP taking any Aspirin (unless otherwise instructed by your surgeon), Aleve, Naproxen, Ibuprofen, Motrin, Advil, Goody's, BC's, all herbal medications, fish oil, and all vitamins.   WHAT DO I DO ABOUT MY DIABETES MEDICATION?   Marland Kitchen Do not take oral diabetes medicines (pills) the morning of surgery. metFORMIN (GLUCOPHAGE)   How to Manage Your Diabetes Before and After Surgery  Why is it important to control my blood sugar before and after surgery? . Improving blood sugar levels before and after surgery helps healing and can limit problems. . A way of improving blood sugar control is eating a healthy diet by: o  Eating less sugar and carbohydrates o  Increasing activity/exercise o  Talking with your doctor about reaching your blood sugar goals . High blood sugars (greater than 180 mg/dL) can raise your risk of infections and slow your recovery, so you will need to focus on controlling your diabetes during the weeks before surgery. . Make sure that the doctor who takes care of your diabetes knows about your planned surgery including the date and location.  How do I manage my blood sugar before surgery? . Check your blood sugar at least 4 times a day, starting 2 days before surgery, to make sure that the level is  not too high or low. o Check your blood sugar the morning of your surgery when you wake up and every 2 hours until you get to the Short Stay unit. . If your blood sugar is less than 70 mg/dL, you will need to treat for low blood sugar: o Do not take insulin. o Treat a low blood sugar (less than 70 mg/dL) with  cup of clear juice (cranberry or apple), 4 glucose tablets, OR glucose gel. o Recheck blood sugar in 15 minutes after treatment (to make sure it is greater than 70 mg/dL). If your blood sugar is not greater than 70 mg/dL on recheck, call [phone] for further instructions. . Report your blood sugar to the short stay nurse when you get to Short Stay.  . If you are admitted to the hospital after surgery: o Your blood sugar will be checked by the staff and you will probably be given insulin after surgery (instead of oral diabetes medicines) to make sure you have good blood sugar levels. o The goal for blood sugar control after surgery is 80-180 mg/dL.    Do not wear jewelry, make-up or nail polish.  Do not wear lotions, powders, or perfumes, or deodorant.  Do not shave 48 hours prior to surgery.   Do not bring valuables to the hospital.  [facility] is not responsible for any belongings or valuables.  Contacts, dentures or bridgework may not be worn into surgery.  Leave your suitcase in the car.  After surgery it may be brought to your  room.  For patients admitted to the hospital, discharge time will be determined by your treatment team.  Patients discharged the day of surgery will not be allowed to drive home.    Special instructions:   [facility]- Preparing For Surgery  Before surgery, you can play an important role. Because skin is not sterile, your skin needs to be as free of germs as possible. You can reduce the number of germs on your skin by washing with CHG (chlorahexidine gluconate) Soap before surgery.  CHG is an antiseptic cleaner which kills germs and bonds with the  skin to continue killing germs even after washing.    Oral Hygiene is also important to reduce your risk of infection.  Remember - BRUSH YOUR TEETH THE MORNING OF SURGERY WITH YOUR REGULAR TOOTHPASTE  Please do not use if you have an allergy to CHG or antibacterial soaps. If your skin becomes reddened/irritated stop using the CHG.  Do not shave (including legs and underarms) for at least 48 hours prior to first CHG shower. It is OK to shave your face.  Please follow these instructions carefully.   1. Shower the NIGHT BEFORE SURGERY and the MORNING OF SURGERY with CHG.   2. If you chose to wash your hair, wash your hair first as usual with your normal shampoo.  3. After you shampoo, rinse your hair and body thoroughly to remove the shampoo.  4. Use CHG as you would any other liquid soap. You can apply CHG directly to the skin and wash gently with a scrungie or a clean washcloth.   5. Apply the CHG Soap to your body ONLY FROM THE NECK DOWN.  Do not use on open wounds or open sores. Avoid contact with your eyes, ears, mouth and genitals (private parts). Wash Face and genitals (private parts)  with your normal soap.  6. Wash thoroughly, paying special attention to the area where your surgery will be performed.  7. Thoroughly rinse your body with warm water from the neck down.  8. DO NOT shower/wash with your normal soap after using and rinsing off the CHG Soap.  9. Pat yourself dry with a CLEAN TOWEL.  10. Wear CLEAN PAJAMAS to bed the night before surgery, wear comfortable clothes the morning of surgery  11. Place CLEAN SHEETS on your bed the night of your first shower and DO NOT SLEEP WITH PETS.    Day of Surgery:  Do not apply any deodorants/lotions.  Please wear clean clothes to the hospital/surgery center.   Remember to brush your teeth WITH YOUR REGULAR TOOTHPASTE.    Please read over the following fact sheets that you were given.

## 2018-05-02 ENCOUNTER — Encounter (HOSPITAL_COMMUNITY): Payer: Self-pay

## 2018-05-02 ENCOUNTER — Other Ambulatory Visit: Payer: Self-pay

## 2018-05-02 ENCOUNTER — Encounter (HOSPITAL_COMMUNITY)
Admission: RE | Admit: 2018-05-02 | Discharge: 2018-05-02 | Disposition: A | Discharge status: Home / Self Care | Payer: Medicare Other | Source: Ambulatory Visit | Attending: Vascular Surgery | Admitting: Vascular Surgery

## 2018-05-02 DIAGNOSIS — Z01812 Encounter for preprocedural laboratory examination: Secondary | ICD-10-CM | POA: Diagnosis not present

## 2018-05-02 HISTORY — DX: Chronic obstructive pulmonary disease, unspecified: J44.9

## 2018-05-02 LAB — COMPREHENSIVE METABOLIC PANEL
ALT: 24 U/L (ref 0–44)
AST: 18 U/L (ref 15–41)
Albumin: 3.8 g/dL (ref 3.5–5.0)
Alkaline Phosphatase: 83 U/L (ref 38–126)
Anion gap: 11 (ref 5–15)
BUN: 15 mg/dL (ref 8–23)
CO2: 23 mmol/L (ref 22–32)
Calcium: 9.5 mg/dL (ref 8.9–10.3)
Chloride: 101 mmol/L (ref 98–111)
Creatinine, Ser: 0.75 mg/dL (ref 0.44–1.00)
GFR calc Af Amer: >60 mL/min (ref >60)
GFR calc non Af Amer: >60 mL/min (ref >60)
Glucose, Bld: 110 mg/dL — ABNORMAL HIGH (ref 70–99)
Potassium: 3.7 mmol/L (ref 3.5–5.1)
Sodium: 135 mmol/L (ref 135–145)
Total Bilirubin: 0.5 mg/dL (ref 0.3–1.2)
Total Protein: 7.4 g/dL (ref 6.5–8.1)

## 2018-05-02 LAB — URINALYSIS, ROUTINE W REFLEX MICROSCOPIC
Bilirubin Urine: NEGATIVE
Glucose, UA: NEGATIVE mg/dL
Ketones, ur: NEGATIVE mg/dL
Leukocytes,Ua: NEGATIVE
Nitrite: NEGATIVE
Protein, ur: NEGATIVE mg/dL
Specific Gravity, Urine: 1.016 (ref 1.005–1.030)
pH: 6 (ref 5.0–8.0)

## 2018-05-02 LAB — SURGICAL PCR SCREEN
MRSA, PCR: NEGATIVE
Staphylococcus aureus: POSITIVE — AB

## 2018-05-02 LAB — CBC
HCT: 48.5 % — ABNORMAL HIGH (ref 36.0–46.0)
Hemoglobin: 15.9 g/dL — ABNORMAL HIGH (ref 12.0–15.0)
MCH: 31.6 pg (ref 26.0–34.0)
MCHC: 32.8 g/dL (ref 30.0–36.0)
MCV: 96.4 fL (ref 80.0–100.0)
Platelets: 211 10*3/uL (ref 150–400)
RBC: 5.03 MIL/uL (ref 3.87–5.11)
RDW: 13.7 % (ref 11.5–15.5)
WBC: 7.6 10*3/uL (ref 4.0–10.5)
nRBC: 0 % (ref 0.0–0.2)

## 2018-05-02 LAB — PREPARE RBC (CROSSMATCH)

## 2018-05-02 LAB — GLUCOSE, CAPILLARY: Glucose-Capillary: 106 mg/dL — ABNORMAL HIGH (ref 70–99)

## 2018-05-02 LAB — APTT: aPTT: 29 seconds (ref 24–36)

## 2018-05-02 LAB — PROTIME-INR
INR: 0.86
Prothrombin Time: 11.7 seconds (ref 11.4–15.2)

## 2018-05-02 LAB — ABO/RH: ABO/RH(D): A POS

## 2018-05-02 NOTE — Progress Notes (Signed)
PCP - Koren Shiver Cardiologist - denies  Chest x-ray - not needed EKG - 04/20/18 Stress Test - 04/24/2018 ECHO - denies Cardiac Cath - denies  Fasting Blood Sugar - doesn't know patient doesn't check her blood sugar   Anesthesia review: yes  Patient denies shortness of breath, fever, cough and chest pain at PAT appointment   Patient verbalized understanding of instructions that were given to them at the PAT appointment. Patient was also instructed that they will need to review over the PAT instructions again at home before surgery.

## 2018-05-03 LAB — HEMOGLOBIN A1C
Hgb A1c MFr Bld: 6.4 % — ABNORMAL HIGH (ref 4.8–5.6)
Mean Plasma Glucose: 137 mg/dL

## 2018-05-03 NOTE — Progress Notes (Signed)
Anesthesia Chart Review:  Case:  [id number] Date/Time:  04/19/2018 0715   Procedure:  AORTA BIFEMORAL BYPASS GRAFT (N/A )   Anesthesia type:  General   Pre-op diagnosis:  PERIPHERAL VASCULAR DISEASE WITH CLAUDICATION   Location:  [facility] ROOM 11 / [facility]   Surgeon:  Larina Earthly, MD      DISCUSSION: Patient is a 63 year old female scheduled for the above procedure.  History includes smoking, HTN, HLD, DM2, asthma, COPD, PAD.   Recent low risk stress test. If no acute changes then I would anticipate that she can proceed as planned.   VS: BP (!) 144/72   Pulse 64   Temp 36.6 C   Resp 18   Ht 5\' 4"  (1.626 m)   Wt 62.1 kg   SpO2 98%   BMI 23.50 kg/m   PROVIDERS: Myles Lipps, MD is PCP  - Patient was seen by Morton Amy, MD for preoperative cardiology evaluation on 04/20/18. She had a low risk stress test.  LABS: Labs reviewed: Acceptable for surgery. (all labs ordered are listed, but only abnormal results are displayed)  Labs Reviewed  SURGICAL PCR SCREEN - Abnormal; Notable for the following components:      Result Value   Staphylococcus aureus POSITIVE (*)    All other components within normal limits  URINALYSIS, ROUTINE W REFLEX MICROSCOPIC - Abnormal; Notable for the following components:   Hgb urine dipstick SMALL (*)    Bacteria, UA RARE (*)    All other components within normal limits  GLUCOSE, CAPILLARY - Abnormal; Notable for the following components:   Glucose-Capillary 106 (*)    All other components within normal limits  CBC - Abnormal; Notable for the following components:   Hemoglobin 15.9 (*)    HCT 48.5 (*)    All other components within normal limits  COMPREHENSIVE METABOLIC PANEL - Abnormal; Notable for the following components:   Glucose, Bld 110 (*)    All other components within normal limits  HEMOGLOBIN A1C - Abnormal; Notable for the following components:   Hgb A1c MFr Bld 6.4 (*)    All other components within normal limits  APTT   PROTIME-INR  PREPARE RBC (CROSSMATCH)  TYPE AND SCREEN  ABO/RH    IMAGES: CTA Ao + BiFem 04/12/18: VASCULAR - Aortic chronic occlusion below the takeoff of the IMA. - Right common and external iliac artery chronic occlusion. - Right common femoral artery reconstitutes and there is 3 vessel runoff to the right ankle. - Left common iliac artery is occluded. The left external iliac artery is virtually occluded. - Left common femoral artery reconstitutes and there is 3 vessel runoff to the left ankle. - Significant stenosis at the origin of the celiac axis and IMA. The SMA is patent with some soft plaque in the proximal trunk. - Severe narrowing at the origin of the right renal artery. The left renal artery is patent. NON-VASCULAR -  6 mm right middle lobe pulmonary nodule. Non-contrast chest CT at 6-12 months is recommended. If the nodule is stable at time of repeat CT, then future CT at 18-24 months (from today's scan) is considered optional for low-risk patients, but is recommended for high-risk patients. This recommendation follows the consensus statement: Guidelines for Management of Incidental Pulmonary Nodules Detected on CT Images: From the Fleischner Society 2017; Radiology 2017; 284:228-243. - Chronic changes in the lower pole of the left kidney.   EKG: 04/20/18: SR, non-specific ST/T wave changes anteroseptal leads.  CV: Lexiscan Myoview nuclear stress test 04/24/2018: 1. Lexiscan stress test was performed. Exercise capacity was not assessed. Stress symptoms included dyspnea, dizziness, and abdominal pain. Resting blood pressure was 158/96 mmHg and peak effect blood pressure was 152/86 mmHg. Stress EKG is non diagnostic for ischemia as it is a pharmacologic stress. In addition, stress electrocardiogram showed sinus tachycardia, normal stress conduction, PAC's and nonspecific ST-T changes.   2. The overall quality of the study is good. There is no evidence of abnormal lung activity.  Stress and rest SPECT images demonstrate homogeneous tracer distribution throughout the myocardium. Gated SPECT imaging reveals normal myocardial thickening and wall motion. The left ventricular ejection fraction was normal (74%).   3. Low risk study.    Past Medical History:  Diagnosis Date  . Anxiety   . Asthma   . COPD (chronic obstructive pulmonary disease) (HCC)   . Depression   . Diabetes mellitus without complication (HCC)   . Hyperlipidemia   . Hypertension     Past Surgical History:  Procedure Laterality Date  . DILATION AND CURETTAGE OF UTERUS    . INNER EAR SURGERY      MEDICATIONS: . acetaminophen (TYLENOL) 325 MG tablet  . aspirin EC 81 MG tablet  . atorvastatin (LIPITOR) 80 MG tablet  . buPROPion (WELLBUTRIN SR) 150 MG 12 hr tablet  . Fluticasone-Salmeterol (ADVAIR) 100-50 MCG/DOSE AEPB  . hydrochlorothiazide (HYDRODIURIL) 25 MG tablet  . metFORMIN (GLUCOPHAGE) 500 MG tablet  . metoprolol tartrate (LOPRESSOR) 25 MG tablet  . venlafaxine XR (EFFEXOR-XR) 150 MG 24 hr capsule   No current facility-administered medications for this encounter.     Shonna Chock, PA-C Surgical Short Stay/Anesthesiology [facility] Phone [phone] [facility] Phone [phone] 05/03/2018 1:21 PM

## 2018-05-09 ENCOUNTER — Encounter (HOSPITAL_COMMUNITY): Payer: Self-pay | Admitting: Anesthesiology

## 2018-05-09 NOTE — Anesthesia Preprocedure Evaluation (Addendum)
Anesthesia Evaluation  Patient identified by MRN, date of birth, ID band Patient awake    Reviewed: Allergy & Precautions, NPO status , Patient's Chart, lab work & pertinent test results, reviewed documented beta blocker date and time   Airway Mallampati: III  TM Distance: >3 FB Neck ROM: Full    Dental no notable dental hx. (+) Dental Advisory Given, Edentulous Upper, Edentulous Lower   Pulmonary asthma , COPD, Current Smoker,    Pulmonary exam normal breath sounds clear to auscultation       Cardiovascular hypertension, Pt. on medications and Pt. on home beta blockers + Peripheral Vascular Disease  Normal cardiovascular exam Rhythm:Regular Rate:Normal  Aortoiliac occlusive disease Intermittent claudication   Neuro/Psych PSYCHIATRIC DISORDERS Anxiety Depression negative neurological ROS     GI/Hepatic Neg liver ROS, GERD  Medicated and Controlled,  Endo/Other  diabetes, Well Controlled, Type 2, Oral Hypoglycemic Agents  Renal/GU negative Renal ROS  negative genitourinary   Musculoskeletal negative musculoskeletal ROS (+)   Abdominal (+) - obese,  Abdomen: soft. Bowel sounds: normal.  Peds  Hematology negative hematology ROS (+)   Anesthesia Other Findings   Reproductive/Obstetrics                           Anesthesia Physical Anesthesia Plan  ASA: III  Anesthesia Plan: General   Post-op Pain Management:    Induction: Intravenous  PONV Risk Score and Plan: 4 or greater and Ondansetron, Dexamethasone, Treatment may vary due to age or medical condition, Midazolam and Scopolamine patch - Pre-op  Airway Management Planned: Oral ETT  Additional Equipment: Arterial line and CVP  Intra-op Plan:   Post-operative Plan: Extubation in OR  Informed Consent: I have reviewed the patients History and Physical, chart, labs and discussed the procedure including the risks, benefits and  alternatives for the proposed anesthesia with the patient or authorized representative who has indicated his/her understanding and acceptance.     Dental advisory given  Plan Discussed with: CRNA and Surgeon  Anesthesia Plan Comments:        Anesthesia Quick Evaluation

## 2018-05-10 ENCOUNTER — Inpatient Hospital Stay (HOSPITAL_COMMUNITY): Payer: Medicare Other | Admitting: Anesthesiology

## 2018-05-10 ENCOUNTER — Other Ambulatory Visit: Payer: Self-pay

## 2018-05-10 ENCOUNTER — Inpatient Hospital Stay (HOSPITAL_COMMUNITY): Payer: Medicare Other

## 2018-05-10 ENCOUNTER — Inpatient Hospital Stay (HOSPITAL_COMMUNITY)
Admission: RE | Admit: 2018-05-10 | Discharge: 2018-06-14 | DRG: 268 | Disposition: E | Payer: Medicare Other | Attending: Vascular Surgery | Admitting: Vascular Surgery

## 2018-05-10 ENCOUNTER — Inpatient Hospital Stay (HOSPITAL_COMMUNITY): Payer: Medicare Other | Admitting: Vascular Surgery

## 2018-05-10 ENCOUNTER — Encounter (HOSPITAL_COMMUNITY): Payer: Self-pay | Admitting: *Deleted

## 2018-05-10 ENCOUNTER — Encounter (HOSPITAL_COMMUNITY): Admission: RE | Disposition: E | Payer: Self-pay | Source: Home / Self Care | Attending: Vascular Surgery

## 2018-05-10 DIAGNOSIS — E1129 Type 2 diabetes mellitus with other diabetic kidney complication: Secondary | ICD-10-CM | POA: Diagnosis not present

## 2018-05-10 DIAGNOSIS — R0602 Shortness of breath: Secondary | ICD-10-CM | POA: Diagnosis not present

## 2018-05-10 DIAGNOSIS — K55049 Acute infarction of large intestine, extent unspecified: Secondary | ICD-10-CM | POA: Diagnosis not present

## 2018-05-10 DIAGNOSIS — E46 Unspecified protein-calorie malnutrition: Secondary | ICD-10-CM | POA: Diagnosis not present

## 2018-05-10 DIAGNOSIS — J9600 Acute respiratory failure, unspecified whether with hypoxia or hypercapnia: Secondary | ICD-10-CM | POA: Diagnosis not present

## 2018-05-10 DIAGNOSIS — I7410 Embolism and thrombosis of unspecified parts of aorta: Secondary | ICD-10-CM

## 2018-05-10 DIAGNOSIS — F329 Major depressive disorder, single episode, unspecified: Secondary | ICD-10-CM | POA: Diagnosis present

## 2018-05-10 DIAGNOSIS — R571 Hypovolemic shock: Secondary | ICD-10-CM | POA: Diagnosis not present

## 2018-05-10 DIAGNOSIS — Z7189 Other specified counseling: Secondary | ICD-10-CM | POA: Diagnosis not present

## 2018-05-10 DIAGNOSIS — T859XXA Unspecified complication of internal prosthetic device, implant and graft, initial encounter: Secondary | ICD-10-CM

## 2018-05-10 DIAGNOSIS — K7200 Acute and subacute hepatic failure without coma: Secondary | ICD-10-CM | POA: Diagnosis not present

## 2018-05-10 DIAGNOSIS — I700 Atherosclerosis of aorta: Secondary | ICD-10-CM

## 2018-05-10 DIAGNOSIS — J9690 Respiratory failure, unspecified, unspecified whether with hypoxia or hypercapnia: Secondary | ICD-10-CM | POA: Diagnosis not present

## 2018-05-10 DIAGNOSIS — E876 Hypokalemia: Secondary | ICD-10-CM | POA: Diagnosis not present

## 2018-05-10 DIAGNOSIS — E1151 Type 2 diabetes mellitus with diabetic peripheral angiopathy without gangrene: Secondary | ICD-10-CM | POA: Diagnosis present

## 2018-05-10 DIAGNOSIS — I739 Peripheral vascular disease, unspecified: Secondary | ICD-10-CM | POA: Diagnosis not present

## 2018-05-10 DIAGNOSIS — D62 Acute posthemorrhagic anemia: Secondary | ICD-10-CM | POA: Diagnosis not present

## 2018-05-10 DIAGNOSIS — R680 Hypothermia, not associated with low environmental temperature: Secondary | ICD-10-CM | POA: Diagnosis not present

## 2018-05-10 DIAGNOSIS — I953 Hypotension of hemodialysis: Secondary | ICD-10-CM | POA: Diagnosis not present

## 2018-05-10 DIAGNOSIS — I7409 Other arterial embolism and thrombosis of abdominal aorta: Principal | ICD-10-CM | POA: Diagnosis present

## 2018-05-10 DIAGNOSIS — Z8673 Personal history of transient ischemic attack (TIA), and cerebral infarction without residual deficits: Secondary | ICD-10-CM

## 2018-05-10 DIAGNOSIS — R34 Anuria and oliguria: Secondary | ICD-10-CM | POA: Diagnosis not present

## 2018-05-10 DIAGNOSIS — Z7951 Long term (current) use of inhaled steroids: Secondary | ICD-10-CM

## 2018-05-10 DIAGNOSIS — Z452 Encounter for adjustment and management of vascular access device: Secondary | ICD-10-CM | POA: Diagnosis not present

## 2018-05-10 DIAGNOSIS — Z433 Encounter for attention to colostomy: Secondary | ICD-10-CM | POA: Diagnosis not present

## 2018-05-10 DIAGNOSIS — K559 Vascular disorder of intestine, unspecified: Secondary | ICD-10-CM | POA: Diagnosis not present

## 2018-05-10 DIAGNOSIS — E119 Type 2 diabetes mellitus without complications: Secondary | ICD-10-CM | POA: Diagnosis not present

## 2018-05-10 DIAGNOSIS — I639 Cerebral infarction, unspecified: Secondary | ICD-10-CM | POA: Diagnosis not present

## 2018-05-10 DIAGNOSIS — Z9889 Other specified postprocedural states: Secondary | ICD-10-CM

## 2018-05-10 DIAGNOSIS — T82594A Other mechanical complication of infusion catheter, initial encounter: Secondary | ICD-10-CM

## 2018-05-10 DIAGNOSIS — Z66 Do not resuscitate: Secondary | ICD-10-CM | POA: Diagnosis not present

## 2018-05-10 DIAGNOSIS — G92 Toxic encephalopathy: Secondary | ICD-10-CM | POA: Diagnosis not present

## 2018-05-10 DIAGNOSIS — J9809 Other diseases of bronchus, not elsewhere classified: Secondary | ICD-10-CM | POA: Diagnosis not present

## 2018-05-10 DIAGNOSIS — N179 Acute kidney failure, unspecified: Secondary | ICD-10-CM | POA: Diagnosis not present

## 2018-05-10 DIAGNOSIS — F419 Anxiety disorder, unspecified: Secondary | ICD-10-CM | POA: Diagnosis present

## 2018-05-10 DIAGNOSIS — Z7984 Long term (current) use of oral hypoglycemic drugs: Secondary | ICD-10-CM

## 2018-05-10 DIAGNOSIS — E875 Hyperkalemia: Secondary | ICD-10-CM | POA: Diagnosis not present

## 2018-05-10 DIAGNOSIS — Z8249 Family history of ischemic heart disease and other diseases of the circulatory system: Secondary | ICD-10-CM

## 2018-05-10 DIAGNOSIS — E785 Hyperlipidemia, unspecified: Secondary | ICD-10-CM | POA: Diagnosis present

## 2018-05-10 DIAGNOSIS — R4182 Altered mental status, unspecified: Secondary | ICD-10-CM | POA: Diagnosis not present

## 2018-05-10 DIAGNOSIS — Z720 Tobacco use: Secondary | ICD-10-CM | POA: Diagnosis not present

## 2018-05-10 DIAGNOSIS — F05 Delirium due to known physiological condition: Secondary | ICD-10-CM | POA: Diagnosis not present

## 2018-05-10 DIAGNOSIS — K55019 Acute (reversible) ischemia of small intestine, extent unspecified: Secondary | ICD-10-CM | POA: Diagnosis not present

## 2018-05-10 DIAGNOSIS — K55029 Acute infarction of small intestine, extent unspecified: Secondary | ICD-10-CM | POA: Diagnosis not present

## 2018-05-10 DIAGNOSIS — J810 Acute pulmonary edema: Secondary | ICD-10-CM | POA: Diagnosis not present

## 2018-05-10 DIAGNOSIS — Z951 Presence of aortocoronary bypass graft: Secondary | ICD-10-CM | POA: Diagnosis not present

## 2018-05-10 DIAGNOSIS — D696 Thrombocytopenia, unspecified: Secondary | ICD-10-CM | POA: Diagnosis not present

## 2018-05-10 DIAGNOSIS — J449 Chronic obstructive pulmonary disease, unspecified: Secondary | ICD-10-CM | POA: Diagnosis present

## 2018-05-10 DIAGNOSIS — J9601 Acute respiratory failure with hypoxia: Secondary | ICD-10-CM | POA: Diagnosis not present

## 2018-05-10 DIAGNOSIS — Z9289 Personal history of other medical treatment: Secondary | ICD-10-CM

## 2018-05-10 DIAGNOSIS — R578 Other shock: Secondary | ICD-10-CM | POA: Diagnosis not present

## 2018-05-10 DIAGNOSIS — Z833 Family history of diabetes mellitus: Secondary | ICD-10-CM

## 2018-05-10 DIAGNOSIS — R579 Shock, unspecified: Secondary | ICD-10-CM | POA: Diagnosis not present

## 2018-05-10 DIAGNOSIS — A419 Sepsis, unspecified organism: Secondary | ICD-10-CM | POA: Diagnosis not present

## 2018-05-10 DIAGNOSIS — J8410 Pulmonary fibrosis, unspecified: Secondary | ICD-10-CM | POA: Diagnosis not present

## 2018-05-10 DIAGNOSIS — J90 Pleural effusion, not elsewhere classified: Secondary | ICD-10-CM | POA: Diagnosis not present

## 2018-05-10 DIAGNOSIS — I469 Cardiac arrest, cause unspecified: Secondary | ICD-10-CM | POA: Diagnosis not present

## 2018-05-10 DIAGNOSIS — S31109A Unspecified open wound of abdominal wall, unspecified quadrant without penetration into peritoneal cavity, initial encounter: Secondary | ICD-10-CM | POA: Diagnosis not present

## 2018-05-10 DIAGNOSIS — I10 Essential (primary) hypertension: Secondary | ICD-10-CM | POA: Diagnosis not present

## 2018-05-10 DIAGNOSIS — F17210 Nicotine dependence, cigarettes, uncomplicated: Secondary | ICD-10-CM | POA: Diagnosis present

## 2018-05-10 DIAGNOSIS — Z7982 Long term (current) use of aspirin: Secondary | ICD-10-CM

## 2018-05-10 DIAGNOSIS — Z4682 Encounter for fitting and adjustment of non-vascular catheter: Secondary | ICD-10-CM | POA: Diagnosis not present

## 2018-05-10 DIAGNOSIS — R918 Other nonspecific abnormal finding of lung field: Secondary | ICD-10-CM | POA: Diagnosis not present

## 2018-05-10 DIAGNOSIS — Z515 Encounter for palliative care: Secondary | ICD-10-CM | POA: Diagnosis not present

## 2018-05-10 DIAGNOSIS — R6521 Severe sepsis with septic shock: Secondary | ICD-10-CM | POA: Diagnosis not present

## 2018-05-10 DIAGNOSIS — Z781 Physical restraint status: Secondary | ICD-10-CM

## 2018-05-10 DIAGNOSIS — K55039 Acute (reversible) ischemia of large intestine, extent unspecified: Secondary | ICD-10-CM | POA: Diagnosis not present

## 2018-05-10 DIAGNOSIS — Z959 Presence of cardiac and vascular implant and graft, unspecified: Secondary | ICD-10-CM | POA: Diagnosis not present

## 2018-05-10 DIAGNOSIS — G936 Cerebral edema: Secondary | ICD-10-CM | POA: Diagnosis not present

## 2018-05-10 DIAGNOSIS — K55059 Acute (reversible) ischemia of intestine, part and extent unspecified: Secondary | ICD-10-CM | POA: Diagnosis not present

## 2018-05-10 DIAGNOSIS — J9811 Atelectasis: Secondary | ICD-10-CM | POA: Diagnosis not present

## 2018-05-10 DIAGNOSIS — N170 Acute kidney failure with tubular necrosis: Secondary | ICD-10-CM | POA: Diagnosis not present

## 2018-05-10 DIAGNOSIS — E872 Acidosis: Secondary | ICD-10-CM | POA: Diagnosis not present

## 2018-05-10 DIAGNOSIS — D72829 Elevated white blood cell count, unspecified: Secondary | ICD-10-CM

## 2018-05-10 DIAGNOSIS — G9340 Encephalopathy, unspecified: Secondary | ICD-10-CM | POA: Diagnosis not present

## 2018-05-10 DIAGNOSIS — I70213 Atherosclerosis of native arteries of extremities with intermittent claudication, bilateral legs: Secondary | ICD-10-CM | POA: Diagnosis present

## 2018-05-10 DIAGNOSIS — R570 Cardiogenic shock: Secondary | ICD-10-CM | POA: Diagnosis not present

## 2018-05-10 DIAGNOSIS — D6489 Other specified anemias: Secondary | ICD-10-CM | POA: Diagnosis not present

## 2018-05-10 DIAGNOSIS — L8990 Pressure ulcer of unspecified site, unspecified stage: Secondary | ICD-10-CM

## 2018-05-10 HISTORY — PX: AORTA - BILATERAL FEMORAL ARTERY BYPASS GRAFT: SHX1175

## 2018-05-10 LAB — POCT I-STAT 7, (LYTES, BLD GAS, ICA,H+H)
Acid-base deficit: 1 mmol/L (ref 0.0–2.0)
Bicarbonate: 24.6 mmol/L (ref 20.0–28.0)
Bicarbonate: 23.4 mmol/L (ref 20.0–28.0)
Calcium, Ion: 1.21 mmol/L (ref 1.15–1.40)
Calcium, Ion: 1.12 mmol/L — ABNORMAL LOW (ref 1.15–1.40)
HCT: 36 % (ref 36.0–46.0)
HCT: 32 % — ABNORMAL LOW (ref 36.0–46.0)
Hemoglobin: 12.2 g/dL (ref 12.0–15.0)
Hemoglobin: 10.9 g/dL — ABNORMAL LOW (ref 12.0–15.0)
O2 Saturation: 99 %
O2 Saturation: 100 %
PO2 ART: 129 mmHg — AB (ref 83.0–108.0)
Potassium: 3.6 mmol/L (ref 3.5–5.1)
Potassium: 3.7 mmol/L (ref 3.5–5.1)
Sodium: 137 mmol/L (ref 135–145)
Sodium: 135 mmol/L (ref 135–145)
TCO2: 26 mmol/L (ref 22–32)
TCO2: 25 mmol/L (ref 22–32)
pCO2 arterial: 38.5 mmHg (ref 32.0–48.0)
pCO2 arterial: 38.8 mmHg (ref 32.0–48.0)
pH, Arterial: 7.412 (ref 7.350–7.450)
pH, Arterial: 7.388 (ref 7.350–7.450)
pO2, Arterial: 338 mmHg — ABNORMAL HIGH (ref 83.0–108.0)

## 2018-05-10 LAB — BASIC METABOLIC PANEL
Anion gap: 9 (ref 5–15)
BUN: 13 mg/dL (ref 8–23)
CO2: 22 mmol/L (ref 22–32)
Calcium: 7.9 mg/dL — ABNORMAL LOW (ref 8.9–10.3)
Chloride: 104 mmol/L (ref 98–111)
Creatinine, Ser: 1.11 mg/dL — ABNORMAL HIGH (ref 0.44–1.00)
GFR calc Af Amer: >60 mL/min (ref >60)
GFR calc non Af Amer: 53 mL/min — ABNORMAL LOW (ref >60)
Glucose, Bld: 177 mg/dL — ABNORMAL HIGH (ref 70–99)
Potassium: 4 mmol/L (ref 3.5–5.1)
Sodium: 135 mmol/L (ref 135–145)

## 2018-05-10 LAB — CBC
HCT: 35.6 % — ABNORMAL LOW (ref 36.0–46.0)
Hemoglobin: 11.3 g/dL — ABNORMAL LOW (ref 12.0–15.0)
MCH: 31 pg (ref 26.0–34.0)
MCHC: 31.7 g/dL (ref 30.0–36.0)
MCV: 97.5 fL (ref 80.0–100.0)
Platelets: 158 10*3/uL (ref 150–400)
RBC: 3.65 MIL/uL — ABNORMAL LOW (ref 3.87–5.11)
RDW: 13.9 % (ref 11.5–15.5)
WBC: 17 10*3/uL — ABNORMAL HIGH (ref 4.0–10.5)
nRBC: 0 % (ref 0.0–0.2)

## 2018-05-10 LAB — GLUCOSE, CAPILLARY
Glucose-Capillary: 134 mg/dL — ABNORMAL HIGH (ref 70–99)
Glucose-Capillary: 193 mg/dL — ABNORMAL HIGH (ref 70–99)
Glucose-Capillary: 225 mg/dL — ABNORMAL HIGH (ref 70–99)
Glucose-Capillary: 309 mg/dL — ABNORMAL HIGH (ref 70–99)

## 2018-05-10 LAB — MAGNESIUM: Magnesium: 1.9 mg/dL (ref 1.7–2.4)

## 2018-05-10 LAB — POCT I-STAT 4, (NA,K, GLUC, HGB,HCT)
Glucose, Bld: 137 mg/dL — ABNORMAL HIGH (ref 70–99)
HCT: 32 % — ABNORMAL LOW (ref 36.0–46.0)
Hemoglobin: 10.9 g/dL — ABNORMAL LOW (ref 12.0–15.0)
POTASSIUM: 3.7 mmol/L (ref 3.5–5.1)
SODIUM: 135 mmol/L (ref 135–145)

## 2018-05-10 LAB — BLOOD GAS, ARTERIAL
ACID-BASE DEFICIT: 5.2 mmol/L — AB (ref 0.0–2.0)
Bicarbonate: 21.1 mmol/L (ref 20.0–28.0)
O2 Saturation: 97.4 %
PO2 ART: 130 mmHg — AB (ref 83.0–108.0)
Patient temperature: 98.6
pCO2 arterial: 51 mmHg — ABNORMAL HIGH (ref 32.0–48.0)
pH, Arterial: 7.239 — ABNORMAL LOW (ref 7.350–7.450)

## 2018-05-10 LAB — APTT: aPTT: 34 seconds (ref 24–36)

## 2018-05-10 LAB — PROTIME-INR
INR: 1.1 (ref 0.8–1.2)
Prothrombin Time: 14.4 seconds (ref 11.4–15.2)

## 2018-05-10 SURGERY — CREATION, BYPASS, ARTERIAL, AORTA TO FEMORAL, BILATERAL, USING GRAFT
Anesthesia: General | Site: Abdomen

## 2018-05-10 MED ORDER — HYDROMORPHONE HCL 1 MG/ML IJ SOLN: 0.5000 mg | INTRAMUSCULAR | Status: DC | PRN

## 2018-05-10 MED ORDER — LABETALOL HCL 5 MG/ML IV SOLN
10.0000 mg | INTRAVENOUS | Status: AC | PRN
  Administered 2018-05-10 (×3): 10 mg via INTRAVENOUS
  Filled 2018-05-10 (×4): qty 4

## 2018-05-10 MED ORDER — VANCOMYCIN HCL IN DEXTROSE 1-5 GM/200ML-% IV SOLN
1000.0000 mg | INTRAVENOUS | Status: AC
  Administered 2018-05-10: 1000 mg via INTRAVENOUS

## 2018-05-10 MED ORDER — PHENOL 1.4 % MT LIQD
1.0000 | OROMUCOSAL | Status: DC | PRN
  Administered 2018-05-10: 1 via OROMUCOSAL
  Filled 2018-05-10: qty 177

## 2018-05-10 MED ORDER — ROCURONIUM BROMIDE 50 MG/5ML IV SOSY
PREFILLED_SYRINGE | INTRAVENOUS | Status: AC
  Filled 2018-05-10: qty 5

## 2018-05-10 MED ORDER — MIDAZOLAM HCL 5 MG/5ML IJ SOLN
INTRAMUSCULAR | Status: DC | PRN
  Administered 2018-05-10: 2 mg via INTRAVENOUS

## 2018-05-10 MED ORDER — PROPOFOL 10 MG/ML IV BOLUS
INTRAVENOUS | Status: AC
  Filled 2018-05-10: qty 20

## 2018-05-10 MED ORDER — PHENYLEPHRINE 40 MCG/ML (10ML) SYRINGE FOR IV PUSH (FOR BLOOD PRESSURE SUPPORT)
PREFILLED_SYRINGE | INTRAVENOUS | Status: DC | PRN
  Administered 2018-05-10 (×2): 80 ug via INTRAVENOUS

## 2018-05-10 MED ORDER — DEXAMETHASONE SODIUM PHOSPHATE 10 MG/ML IJ SOLN
INTRAMUSCULAR | Status: AC
  Filled 2018-05-10: qty 1

## 2018-05-10 MED ORDER — NITROGLYCERIN 0.2 MG/ML ON CALL CATH LAB
INTRAVENOUS | Status: DC | PRN
  Administered 2018-05-10: 40 ug via INTRAVENOUS
  Administered 2018-05-10: 80 ug via INTRAVENOUS
  Administered 2018-05-10 (×4): 40 ug via INTRAVENOUS

## 2018-05-10 MED ORDER — ORAL CARE MOUTH RINSE
15.0000 mL | Freq: Two times a day (BID) | OROMUCOSAL | Status: DC
  Administered 2018-05-10 – 2018-05-12 (×3): 15 mL via OROMUCOSAL

## 2018-05-10 MED ORDER — ALBUMIN HUMAN 5 % IV SOLN
INTRAVENOUS | Status: DC | PRN
  Administered 2018-05-10 (×2): via INTRAVENOUS

## 2018-05-10 MED ORDER — ROCURONIUM BROMIDE 50 MG/5ML IV SOSY
PREFILLED_SYRINGE | INTRAVENOUS | Status: DC | PRN
  Administered 2018-05-10 (×2): 30 mg via INTRAVENOUS
  Administered 2018-05-10 (×2): 50 mg via INTRAVENOUS

## 2018-05-10 MED ORDER — SUGAMMADEX SODIUM 200 MG/2ML IV SOLN
INTRAVENOUS | Status: DC | PRN
  Administered 2018-05-10: 100 mg via INTRAVENOUS

## 2018-05-10 MED ORDER — ONDANSETRON HCL 4 MG/2ML IJ SOLN
4.0000 mg | Freq: Four times a day (QID) | INTRAMUSCULAR | Status: DC | PRN
  Administered 2018-05-10 – 2018-05-27 (×5): 4 mg via INTRAVENOUS
  Filled 2018-05-10 (×5): qty 2

## 2018-05-10 MED ORDER — VANCOMYCIN HCL IN DEXTROSE 1-5 GM/200ML-% IV SOLN
INTRAVENOUS | Status: AC
  Administered 2018-05-10: 1000 mg via INTRAVENOUS
  Filled 2018-05-10: qty 200

## 2018-05-10 MED ORDER — FENTANYL CITRATE (PF) 250 MCG/5ML IJ SOLN
INTRAMUSCULAR | Status: AC
  Filled 2018-05-10: qty 10

## 2018-05-10 MED ORDER — ONDANSETRON HCL 4 MG/2ML IJ SOLN
INTRAMUSCULAR | Status: DC | PRN
  Administered 2018-05-10: 4 mg via INTRAVENOUS

## 2018-05-10 MED ORDER — PROTAMINE SULFATE 10 MG/ML IV SOLN
INTRAVENOUS | Status: DC | PRN
  Administered 2018-05-10: 10 mg via INTRAVENOUS
  Administered 2018-05-10 (×2): 20 mg via INTRAVENOUS

## 2018-05-10 MED ORDER — ARTIFICIAL TEARS OPHTHALMIC OINT
TOPICAL_OINTMENT | OPHTHALMIC | Status: DC | PRN
  Administered 2018-05-10: 1 via OPHTHALMIC

## 2018-05-10 MED ORDER — MEPERIDINE HCL 50 MG/ML IJ SOLN: 6.2500 mg | INTRAMUSCULAR | Status: DC | PRN

## 2018-05-10 MED ORDER — HYDROMORPHONE HCL 1 MG/ML IJ SOLN
INTRAMUSCULAR | Status: AC
  Administered 2018-05-10: 0.5 mg via INTRAVENOUS
  Filled 2018-05-10: qty 1

## 2018-05-10 MED ORDER — FENTANYL CITRATE (PF) 250 MCG/5ML IJ SOLN
INTRAMUSCULAR | Status: AC
  Filled 2018-05-10: qty 5

## 2018-05-10 MED ORDER — ENOXAPARIN SODIUM 40 MG/0.4ML ~~LOC~~ SOLN
40.0000 mg | SUBCUTANEOUS | Status: DC
  Administered 2018-05-11: 40 mg via SUBCUTANEOUS
  Filled 2018-05-10: qty 0.4

## 2018-05-10 MED ORDER — LIDOCAINE 2% (20 MG/ML) 5 ML SYRINGE
INTRAMUSCULAR | Status: AC
  Filled 2018-05-10: qty 5

## 2018-05-10 MED ORDER — ACETAMINOPHEN 325 MG PO TABS
325.0000 mg | ORAL_TABLET | ORAL | Status: DC | PRN
  Administered 2018-05-18: 650 mg via ORAL
  Filled 2018-05-10: qty 2

## 2018-05-10 MED ORDER — ACETAMINOPHEN 325 MG RE SUPP
325.0000 mg | RECTAL | Status: DC | PRN
  Filled 2018-05-10 (×2): qty 2

## 2018-05-10 MED ORDER — MANNITOL 25 % IV SOLN
INTRAVENOUS | Status: DC | PRN
  Administered 2018-05-10: 25 g via INTRAVENOUS

## 2018-05-10 MED ORDER — FUROSEMIDE 10 MG/ML IJ SOLN
INTRAMUSCULAR | Status: DC | PRN
  Administered 2018-05-10 (×2): 20 mg via INTRAMUSCULAR

## 2018-05-10 MED ORDER — SODIUM CHLORIDE 0.9 % IV SOLN
INTRAVENOUS | Status: DC | PRN
  Administered 2018-05-10: 25 ug/min via INTRAVENOUS

## 2018-05-10 MED ORDER — FENTANYL CITRATE (PF) 100 MCG/2ML IJ SOLN
INTRAMUSCULAR | Status: DC | PRN
  Administered 2018-05-10: 50 ug via INTRAVENOUS
  Administered 2018-05-10: 100 ug via INTRAVENOUS
  Administered 2018-05-10 (×2): 50 ug via INTRAVENOUS
  Administered 2018-05-10: 100 ug via INTRAVENOUS
  Administered 2018-05-10 (×6): 50 ug via INTRAVENOUS

## 2018-05-10 MED ORDER — MAGNESIUM SULFATE 2 GM/50ML IV SOLN
2.0000 g | Freq: Every day | INTRAVENOUS | Status: AC | PRN
  Administered 2018-05-11: 2 g via INTRAVENOUS
  Filled 2018-05-10: qty 50

## 2018-05-10 MED ORDER — INSULIN ASPART 100 UNIT/ML ~~LOC~~ SOLN
0.0000 [IU] | Freq: Three times a day (TID) | SUBCUTANEOUS | Status: DC
  Administered 2018-05-10 – 2018-05-11 (×2): 3 [IU] via SUBCUTANEOUS

## 2018-05-10 MED ORDER — LABETALOL HCL 5 MG/ML IV SOLN
INTRAVENOUS | Status: AC
  Filled 2018-05-10: qty 4

## 2018-05-10 MED ORDER — VANCOMYCIN HCL 1000 MG IV SOLR
INTRAVENOUS | Status: DC | PRN
  Administered 2018-05-10: 1000 mg via INTRAVENOUS

## 2018-05-10 MED ORDER — HYDRALAZINE HCL 20 MG/ML IJ SOLN
5.0000 mg | INTRAMUSCULAR | Status: AC | PRN
  Administered 2018-05-10 (×2): 5 mg via INTRAVENOUS
  Filled 2018-05-10 (×2): qty 1

## 2018-05-10 MED ORDER — KCL IN DEXTROSE-NACL 20-5-0.45 MEQ/L-%-% IV SOLN
INTRAVENOUS | Status: DC
  Administered 2018-05-10: 15:00:00 via INTRAVENOUS
  Filled 2018-05-10 (×2): qty 1000

## 2018-05-10 MED ORDER — SODIUM CHLORIDE 0.9 % IV SOLN
INTRAVENOUS | Status: DC
  Administered 2018-05-10: 09:00:00 via INTRAVENOUS

## 2018-05-10 MED ORDER — LACTATED RINGERS IV SOLN
INTRAVENOUS | Status: DC
  Administered 2018-05-10 (×3): via INTRAVENOUS

## 2018-05-10 MED ORDER — NITROGLYCERIN IN D5W 200-5 MCG/ML-% IV SOLN
INTRAVENOUS | Status: DC | PRN
  Administered 2018-05-10: 5 ug/min via INTRAVENOUS

## 2018-05-10 MED ORDER — GUAIFENESIN-DM 100-10 MG/5ML PO SYRP: 15.0000 mL | ORAL_SOLUTION | ORAL | Status: DC | PRN

## 2018-05-10 MED ORDER — LIDOCAINE 2% (20 MG/ML) 5 ML SYRINGE
INTRAMUSCULAR | Status: DC | PRN
  Administered 2018-05-10: 80 mg via INTRAVENOUS

## 2018-05-10 MED ORDER — PANTOPRAZOLE SODIUM 40 MG PO TBEC
40.0000 mg | DELAYED_RELEASE_TABLET | Freq: Every day | ORAL | Status: DC
  Administered 2018-05-11: 40 mg via ORAL
  Filled 2018-05-10: qty 1

## 2018-05-10 MED ORDER — BISACODYL 10 MG RE SUPP
10.0000 mg | Freq: Every day | RECTAL | Status: DC | PRN
  Administered 2018-05-10: 10 mg via RECTAL
  Filled 2018-05-10: qty 1

## 2018-05-10 MED ORDER — METOPROLOL TARTRATE 5 MG/5ML IV SOLN
2.0000 mg | INTRAVENOUS | Status: DC | PRN
  Administered 2018-05-10: 5 mg via INTRAVENOUS
  Filled 2018-05-10 (×2): qty 5

## 2018-05-10 MED ORDER — PROPOFOL 10 MG/ML IV BOLUS
INTRAVENOUS | Status: DC | PRN
  Administered 2018-05-10: 100 mg via INTRAVENOUS

## 2018-05-10 MED ORDER — VANCOMYCIN HCL IN DEXTROSE 1-5 GM/200ML-% IV SOLN
1000.0000 mg | Freq: Two times a day (BID) | INTRAVENOUS | Status: AC
  Administered 2018-05-10: 1000 mg via INTRAVENOUS
  Filled 2018-05-10 (×2): qty 200

## 2018-05-10 MED ORDER — PHENYLEPHRINE 40 MCG/ML (10ML) SYRINGE FOR IV PUSH (FOR BLOOD PRESSURE SUPPORT)
PREFILLED_SYRINGE | INTRAVENOUS | Status: AC
  Filled 2018-05-10: qty 10

## 2018-05-10 MED ORDER — SODIUM CHLORIDE 0.9 % IV SOLN
INTRAVENOUS | Status: AC
  Filled 2018-05-10: qty 1.2

## 2018-05-10 MED ORDER — SODIUM CHLORIDE 0.9 % IV SOLN
500.0000 mL | Freq: Once | INTRAVENOUS | Status: AC | PRN
  Administered 2018-05-11: 500 mL via INTRAVENOUS

## 2018-05-10 MED ORDER — PROTAMINE SULFATE 10 MG/ML IV SOLN
INTRAVENOUS | Status: AC
  Filled 2018-05-10: qty 5

## 2018-05-10 MED ORDER — MIDAZOLAM HCL 2 MG/2ML IJ SOLN
INTRAMUSCULAR | Status: AC
  Filled 2018-05-10: qty 2

## 2018-05-10 MED ORDER — ONDANSETRON HCL 4 MG/2ML IJ SOLN
INTRAMUSCULAR | Status: AC
  Filled 2018-05-10: qty 2

## 2018-05-10 MED ORDER — SODIUM CHLORIDE 0.9 % IV SOLN
INTRAVENOUS | Status: DC | PRN
  Administered 2018-05-10: 500 mL

## 2018-05-10 MED ORDER — HYDROMORPHONE HCL 1 MG/ML IJ SOLN
0.2500 mg | INTRAMUSCULAR | Status: DC | PRN
  Administered 2018-05-10 (×3): 0.5 mg via INTRAVENOUS

## 2018-05-10 MED ORDER — HEPARIN SODIUM (PORCINE) 1000 UNIT/ML IJ SOLN
INTRAMUSCULAR | Status: AC
  Filled 2018-05-10: qty 1

## 2018-05-10 MED ORDER — ALUM & MAG HYDROXIDE-SIMETH 200-200-20 MG/5ML PO SUSP: 15.0000 mL | ORAL | Status: DC | PRN

## 2018-05-10 MED ORDER — NEOSTIGMINE METHYLSULFATE 5 MG/5ML IV SOSY
PREFILLED_SYRINGE | INTRAVENOUS | Status: DC | PRN
  Administered 2018-05-10: 4 mg via INTRAVENOUS

## 2018-05-10 MED ORDER — GLYCOPYRROLATE 0.2 MG/ML IJ SOLN
INTRAMUSCULAR | Status: DC | PRN
  Administered 2018-05-10: 0.4 mg via INTRAVENOUS

## 2018-05-10 MED ORDER — 0.9 % SODIUM CHLORIDE (POUR BTL) OPTIME
TOPICAL | Status: DC | PRN
  Administered 2018-05-10: 3000 mL

## 2018-05-10 MED ORDER — CLEVIDIPINE BUTYRATE 0.5 MG/ML IV EMUL
INTRAVENOUS | Status: DC | PRN
  Administered 2018-05-10: 2 mg/h via INTRAVENOUS

## 2018-05-10 MED ORDER — METOCLOPRAMIDE HCL 5 MG/ML IJ SOLN: 10.0000 mg | Freq: Once | INTRAMUSCULAR | Status: DC | PRN

## 2018-05-10 MED ORDER — LABETALOL HCL 5 MG/ML IV SOLN
10.0000 mg | INTRAVENOUS | Status: DC | PRN
  Administered 2018-05-10: 10 mg via INTRAVENOUS
  Filled 2018-05-10: qty 4

## 2018-05-10 MED ORDER — POTASSIUM CHLORIDE CRYS ER 20 MEQ PO TBCR
20.0000 meq | EXTENDED_RELEASE_TABLET | Freq: Every day | ORAL | Status: AC | PRN
  Administered 2018-05-20: 20 meq via ORAL
  Filled 2018-05-10: qty 1

## 2018-05-10 MED ORDER — HEPARIN SODIUM (PORCINE) 1000 UNIT/ML IJ SOLN
INTRAMUSCULAR | Status: DC | PRN
  Administered 2018-05-10: 6000 [IU] via INTRAVENOUS

## 2018-05-10 MED ORDER — CHLORHEXIDINE GLUCONATE 4 % EX LIQD: 60.0000 mL | Freq: Once | CUTANEOUS | Status: DC

## 2018-05-10 MED ORDER — HYDROMORPHONE HCL 1 MG/ML IJ SOLN
1.0000 mg | INTRAMUSCULAR | Status: DC | PRN
  Administered 2018-05-10 – 2018-05-11 (×4): 1 mg via INTRAVENOUS
  Filled 2018-05-10 (×5): qty 1

## 2018-05-10 SURGICAL SUPPLY — 67 items
ADH SKN CLS APL DERMABOND .7 (GAUZE/BANDAGES/DRESSINGS) ×5
CANISTER SUCT 3000ML PPV (MISCELLANEOUS) ×3 IMPLANT
CANNULA VESSEL 3MM 2 BLNT TIP (CANNULA) ×6 IMPLANT
CLIP LIGATING EXTRA MED SLVR (CLIP) ×3 IMPLANT
CLIP LIGATING EXTRA SM BLUE (MISCELLANEOUS) ×3 IMPLANT
COVER BACK TABLE 60X90IN (DRAPES) ×1 IMPLANT
COVER WAND RF STERILE (DRAPES) ×3 IMPLANT
DERMABOND ADVANCED (GAUZE/BANDAGES/DRESSINGS) ×10
DERMABOND ADVANCED .7 DNX12 (GAUZE/BANDAGES/DRESSINGS) ×2 IMPLANT
DRAPE BILATERAL SPLIT (DRAPES) IMPLANT
DRAPE CV SPLIT W-CLR ANES SCRN (DRAPES) IMPLANT
ELECT BLADE 4.0 EZ CLEAN MEGAD (MISCELLANEOUS) ×3
ELECT CAUTERY BLADE 6.4 (BLADE) ×2 IMPLANT
ELECT REM PT RETURN 9FT ADLT (ELECTROSURGICAL) ×3
ELECTRODE BLDE 4.0 EZ CLN MEGD (MISCELLANEOUS) IMPLANT
ELECTRODE REM PT RTRN 9FT ADLT (ELECTROSURGICAL) ×1 IMPLANT
FELT TEFLON 1X6 (MISCELLANEOUS) ×2 IMPLANT
GLOVE BIO SURGEON STRL SZ 6.5 (GLOVE) ×3 IMPLANT
GLOVE BIO SURGEON STRL SZ7 (GLOVE) ×2 IMPLANT
GLOVE BIO SURGEON STRL SZ7.5 (GLOVE) ×2 IMPLANT
GLOVE BIO SURGEONS STRL SZ 6.5 (GLOVE) ×3
GLOVE BIOGEL PI IND STRL 6.5 (GLOVE) IMPLANT
GLOVE BIOGEL PI IND STRL 8 (GLOVE) IMPLANT
GLOVE BIOGEL PI INDICATOR 6.5 (GLOVE) ×2
GLOVE BIOGEL PI INDICATOR 8 (GLOVE) ×2
GLOVE ECLIPSE 7.0 STRL STRAW (GLOVE) ×2 IMPLANT
GLOVE SS BIOGEL STRL SZ 7.5 (GLOVE) ×1 IMPLANT
GLOVE SUPERSENSE BIOGEL SZ 7.5 (GLOVE) ×2
GLOVE SURG SS PI 6.5 STRL IVOR (GLOVE) ×2 IMPLANT
GOWN STRL REUS W/ TWL LRG LVL3 (GOWN DISPOSABLE) ×3 IMPLANT
GOWN STRL REUS W/ TWL XL LVL3 (GOWN DISPOSABLE) IMPLANT
GOWN STRL REUS W/TWL LRG LVL3 (GOWN DISPOSABLE) ×12
GOWN STRL REUS W/TWL XL LVL3 (GOWN DISPOSABLE) ×3
GRAFT HEMASHIELD 14X7MM (Vascular Products) ×2 IMPLANT
INSERT FOGARTY 61MM (MISCELLANEOUS) ×5 IMPLANT
INSERT FOGARTY SM (MISCELLANEOUS) ×6 IMPLANT
KIT BASIN OR (CUSTOM PROCEDURE TRAY) ×3 IMPLANT
KIT TURNOVER KIT B (KITS) ×3 IMPLANT
NS IRRIG 1000ML POUR BTL (IV SOLUTION) ×8 IMPLANT
PACK AORTA (CUSTOM PROCEDURE TRAY) ×3 IMPLANT
PAD ARMBOARD 7.5X6 YLW CONV (MISCELLANEOUS) ×6 IMPLANT
PENCIL BUTTON HOLSTER BLD 10FT (ELECTRODE) ×2 IMPLANT
SPONGE LAP 18X18 RF (DISPOSABLE) IMPLANT
SUT ETHIBOND 5 LR DA (SUTURE) ×4 IMPLANT
SUT PDS AB 1 TP1 54 (SUTURE) ×6 IMPLANT
SUT PROLENE 3 0 SH1 36 (SUTURE) ×3 IMPLANT
SUT PROLENE 5 0 C 1 24 (SUTURE) ×6 IMPLANT
SUT PROLENE 5 0 C 1 36 (SUTURE) IMPLANT
SUT PROLENE 6 0 CC (SUTURE) ×20 IMPLANT
SUT SILK 2 0 (SUTURE) ×3
SUT SILK 2 0 SH CR/8 (SUTURE) ×3 IMPLANT
SUT SILK 2 0 TIES 17X18 (SUTURE) ×3
SUT SILK 2-0 18XBRD TIE 12 (SUTURE) ×1 IMPLANT
SUT SILK 2-0 18XBRD TIE BLK (SUTURE) ×1 IMPLANT
SUT SILK 3 0 (SUTURE) ×3
SUT SILK 3 0 TIES 17X18 (SUTURE) ×3
SUT SILK 3-0 18XBRD TIE 12 (SUTURE) ×1 IMPLANT
SUT SILK 3-0 18XBRD TIE BLK (SUTURE) ×1 IMPLANT
SUT VIC AB 2-0 CT1 27 (SUTURE) ×3
SUT VIC AB 2-0 CT1 36 (SUTURE) ×6 IMPLANT
SUT VIC AB 2-0 CT1 TAPERPNT 27 (SUTURE) IMPLANT
SUT VIC AB 3-0 SH 27 (SUTURE) ×15
SUT VIC AB 3-0 SH 27X BRD (SUTURE) ×4 IMPLANT
TOWEL GREEN STERILE (TOWEL DISPOSABLE) ×3 IMPLANT
TOWEL SURG RFD BLUE STRL DISP (DISPOSABLE) ×4 IMPLANT
TRAY FOLEY MTR SLVR 16FR STAT (SET/KITS/TRAYS/PACK) ×3 IMPLANT
WATER STERILE IRR 1000ML POUR (IV SOLUTION) ×6 IMPLANT

## 2018-05-10 NOTE — Progress Notes (Signed)
Per Dr. Arbie Cookey, SBP Goal 160-180. Use PRN Labetalol as needed

## 2018-05-10 NOTE — Progress Notes (Signed)
RN spoke with Dr. Arbie Cookey about the patient's persistent elevated blood pressure. Will treat systolic >180. RN made Dr. Arbie Cookey aware of the patient's persistent back pain. No other orders.

## 2018-05-10 NOTE — Op Note (Signed)
OPERATIVE REPORT  DATE OF SURGERY: 05/09/2018  PATIENT: Gwendolyn Pham, 63 y.o. female MRN: [id number]  DOB: 07-09-1955  PRE-OPERATIVE DIAGNOSIS: Aortoiliac occlusive disease with aortic occlusion  POST-OPERATIVE DIAGNOSIS:  Same  PROCEDURE: Aortobifemoral bypass with 14 x 8 Hemashield graft  SURGEON:  Gretta Began, M.D.  PHYSICIAN ASSISTANT: Dr. Clotilde Dieter  ANESTHESIA: General  EBL: per anesthesia record  Total I/O In: 2950 [I.V.:2200; IV Piggyback:750] Out: 365 [Urine:115; Blood:250]  BLOOD ADMINISTERED: none  DRAINS: none  SPECIMEN: none  COUNTS CORRECT:  YES  PATIENT DISPOSITION:  PACU - hemodynamically stable  PROCEDURE DETAILS: The patient was taken to the operating placed supine xiphoid area the abdomen both groins were prepped draped you sterile fashion.  Groin incisions were made in the over the right and left femoral artery and carried down to isolate the common superficial femoral and profundus femoris arteries bilaterally.  The arteries were small but were patent.  Next separate incision was made from the xiphoid to below the umbilicus and carried down through the midline fascia with electrocautery.  The abdomen was entered.  The liver, gallbladder stomach and small bowel were all normal.  The patient did have a great deal of stool in her colon.  The Omni-Tract retractor was used for exposure.  The transverse colon omentum reflected superiorly and the small bowel was reflected to the right.  The aorta was exposed by opening the retroperitoneum and the duodenum was mobilized.  The aorta was a was encircled below the level of the renal arteries at the level of the left renal vein.  Dissection was continued down towards the inferior mesenteric artery on the aorta.  Tunnels were created between the level of the aortic bifurcation and the groins bilaterally and umbilical tapes were placed through these passing behind the ureters bilaterally.  The patient was given 25 g of  mannitol and 6000 units of intravenous heparin.  After active circulation time the aorta was occluded below the level of the arteries.  Was transected below the proximal clamp.  The aorta was chronically occluded.  Thrombus was removed from the distal aorta and the aorta was oversewn with a 3-0 Prolene suture.  Mural thrombus was removed from the infrarenal aorta.  The 14 x 7 Hemashield graft was brought onto the field and was cut to the appropriate length.  Using a felt strip for reinforcement the aortic graft was sewn into into the infrarenal aorta with a running 3-0 Prolene suture.  The test anastomosis was tested and found to be adequate.  The aortic contents were flushed and the limbs were brought to the appropriate groins.  The common superficial femoral and profundus femoris arteries were occluded bilaterally.  Common femoral artery was opened bilaterally and extended longitudinally with Potts scissors.  The limbs were cut to the appropriate length and were spatulated and sewn end-to-side to the artery with running 6-0 Prolene sutures bilaterally.  Prior to completion of the closure the usual flushing maneuvers were undertaken.  Additional sutures were required for hemostasis.  Clamps were removed restoring flow to both lower extremities.  Patient had had little output prior to occlusion of the aortic clamp proximally 100 cc and had 15 cc after occlusion of the clamp.  Patient had Doppler flow in the right renal artery and anterior accessory renal artery on the left.  The left renal artery was sclerotic with a lesser sound.  The wounds were irrigated with saline and the patient was given 50 mg of protamine  to reverse heparin.  Retroperitoneum was closed over the aorta with a running 2-0 Vicryl suture.  The small bowel was run's entirety and found to be without injury was placed back in the pelvis.  The transverse colon omentum were placed over this.  The midline fascia was closed with a #1 PDS suture  beginning proximally and distally and tying tying in the middle.  The groin incisions were closed with 2-0 Vicryl in several layers in the subcutaneous tissue.  Skin incisions were all closed with 3-0 subcuticular Vicryl suture.  Patient had palpable pedal pulses and was transferred to the recovery room in stable condition   Larina Earthly, M.D., [facility] 05/04/2018 11:46 AM

## 2018-05-10 NOTE — Progress Notes (Signed)
   04/15/2018 0700  Clinical Encounter Type  Visited With Health care provider;Patient and family together  Visit Type Initial;Spiritual support;Psychological support;Social support;Pre-op  Referral From Family;Chaplain  Spiritual Encounters  Spiritual Needs Emotional;Prayer  Stress Factors  Patient Stress Factors Loss of control;Health changes  Family Stress Factors Loss of control   Met w/ pt and her sister at referral from Bob Hamilton who had received call requesting prayer yesterday from pt's sister.  RN present for part of my visit.  Compassionate presence w/ pt and her sister, listened to stories and to worries.  Prayed w/ pt and her sister for surgery and recovery.  Both appreciative of visit.   M  Chaplain resident, [phone] 

## 2018-05-10 NOTE — Anesthesia Procedure Notes (Signed)
Procedure Name: Intubation Date/Time: 05/05/2018 7:52 AM Performed by: Lavell Luster, CRNA Pre-anesthesia Checklist: Patient identified, Emergency Drugs available, Suction available, Patient being monitored and Timeout performed Patient Re-evaluated:Patient Re-evaluated prior to induction Oxygen Delivery Method: Circle system utilized Preoxygenation: Pre-oxygenation with 100% oxygen Induction Type: IV induction Ventilation: Mask ventilation without difficulty Laryngoscope Size: Mac and 3 Grade View: Grade I Tube type: Oral Tube size: 7.5 mm Number of attempts: 1 Airway Equipment and Method: Stylet Placement Confirmation: ETT inserted through vocal cords under direct vision,  positive ETCO2 and breath sounds checked- equal and bilateral Secured at: 22 cm Tube secured with: Tape Dental Injury: Teeth and Oropharynx as per pre-operative assessment

## 2018-05-10 NOTE — Anesthesia Procedure Notes (Signed)
Arterial Line Insertion Start/End02/05/2018 7:00 AM, 05/10/2018 7:20 AM Performed by: Dorie Rank, CRNA, CRNA  Patient location: Pre-op. Preanesthetic checklist: patient identified, IV checked, site marked, risks and benefits discussed, surgical consent, monitors and equipment checked, pre-op evaluation and timeout performed Lidocaine 1% used for infiltration and patient sedated Left, radial was placed Catheter size: 20 G Hand hygiene performed , maximum sterile barriers used  and Seldinger technique used Allen's test indicative of satisfactory collateral circulation Attempts: 2 Procedure performed without using ultrasound guided technique. Following insertion, Biopatch and dressing applied. Post procedure assessment: normal  Patient tolerated the procedure well with no immediate complications.

## 2018-05-10 NOTE — Anesthesia Procedure Notes (Signed)
Central Venous Catheter Insertion Performed by: Audry Pili, MD, anesthesiologist Start/End02/10/2018 7:04 AM, 04/30/2018 7:31 AM Patient location: Pre-op. Preanesthetic checklist: patient identified, IV checked, risks and benefits discussed, surgical consent, monitors and equipment checked, pre-op evaluation, timeout performed and anesthesia consent Position: Trendelenburg Lidocaine 1% used for infiltration and patient sedated Hand hygiene performed , maximum sterile barriers used  and Seldinger technique used Catheter size: 8.5 Fr Central line was placed.MAC introducer Procedure performed using ultrasound guided technique. Ultrasound Notes:anatomy identified, needle tip was noted to be adjacent to the nerve/plexus identified, no ultrasound evidence of intravascular and/or intraneural injection and image(s) printed for medical record Attempts: 2 (First attempt by Dr. Royce Macadamia, difficulty encountered threading catheter over guidewire. 2nd attempt by Dr. Fransisco Beau) Following insertion, line sutured, dressing applied and Biopatch. Post procedure assessment: blood return through all ports, no air and free fluid flow  Patient tolerated the procedure well with no immediate complications.

## 2018-05-10 NOTE — Anesthesia Procedure Notes (Signed)
Central Venous Catheter Insertion Performed by: Beryle Lathe, MD, anesthesiologist Start/EndMar 06, 2020 7:29 AM, 2018-05-19 7:31 AM Patient location: Pre-op. Preanesthetic checklist: patient identified, IV checked, risks and benefits discussed, surgical consent, monitors and equipment checked, pre-op evaluation, timeout performed and anesthesia consent Position: Trendelenburg Hand hygiene performed  and maximum sterile barriers used  Total catheter length 10. PA cath was placed.Swan type:thermodilution PA Cath depth:47 Procedure performed without using ultrasound guided technique. Attempts: 1 Patient tolerated the procedure well with no immediate complications.

## 2018-05-10 NOTE — Progress Notes (Signed)
Patient ID: Gwendolyn Pham, female   DOB: 10/18/1955, 63 y.o.   MRN: [id number] Comfortable in PACU. Excellent urine output. Was hypertension but blood pressure 140 systolic with 1 dose of labetalol. Palpable dorsalis pedis pulses bilaterally

## 2018-05-10 NOTE — Interval H&P Note (Signed)
History and Physical Interval Note:  05/12/2018 7:14 AM  Gwendolyn Pham  has presented today for surgery, with the diagnosis of PERIPHERAL VASCULAR DISEASE WITH CLAUDICATION  The various methods of treatment have been discussed with the patient and family. After consideration of risks, benefits and other options for treatment, the patient has consented to  Procedure(s): AORTA BIFEMORAL BYPASS GRAFT (N/A) as a surgical intervention .  The patient's history has been reviewed, patient examined, no change in status, stable for surgery.  I have reviewed the patient's chart and labs.  Questions were answered to the patient's satisfaction.     Gretta Began

## 2018-05-10 NOTE — Anesthesia Postprocedure Evaluation (Signed)
Anesthesia Post Note  Patient: Gwendolyn Pham  Procedure(s) Performed: AORTA BIFEMORAL BYPASS GRAFT (N/A Abdomen)     Patient location during evaluation: PACU Anesthesia Type: General Level of consciousness: awake and alert and oriented Pain management: pain level controlled Vital Signs Assessment: post-procedure vital signs reviewed and stable Respiratory status: spontaneous breathing, nonlabored ventilation and respiratory function stable Cardiovascular status: blood pressure returned to baseline and stable Postop Assessment: no apparent nausea or vomiting Anesthetic complications: no    Last Vitals:  Vitals:   May 15, 2018 1211 05/15/18 1220  BP: (!) 165/85 (!) 161/87  Pulse: (!) 110 (!) 115  Resp: (!) 33 (!) 27  Temp:    SpO2: 98% 97%    Last Pain:  Vitals:   05-15-2018 0609  TempSrc:   PainSc: 0-No pain                 Galaxy Borden A.

## 2018-05-10 NOTE — Transfer of Care (Signed)
Immediate Anesthesia Transfer of Care Note  Patient: Gwendolyn Pham  Procedure(s) Performed: AORTA BIFEMORAL BYPASS GRAFT (N/A Abdomen)  Patient Location: PACU  Anesthesia Type:General  Level of Consciousness: awake, alert  and sedated  Airway & Oxygen Therapy: Patient connected to face mask oxygen  Post-op Assessment: Post -op Vital signs reviewed and stable  Post vital signs: stable  Last Vitals:  Vitals Value Taken Time  BP 158/81 05/08/2018 12:17 PM  Temp    Pulse 112 04/15/2018 12:18 PM  Resp 22 05/12/2018 12:18 PM  SpO2 97 % 05/09/2018 12:18 PM  Vitals shown include unvalidated device data.  Last Pain:  Vitals:   05/09/2018 0609  TempSrc:   PainSc: 0-No pain      Patients Stated Pain Goal: 3 (05/01/2018 1607)  Complications: No apparent anesthesia complications

## 2018-05-11 ENCOUNTER — Inpatient Hospital Stay (HOSPITAL_COMMUNITY): Payer: Medicare Other | Admitting: Anesthesiology

## 2018-05-11 ENCOUNTER — Inpatient Hospital Stay (HOSPITAL_COMMUNITY): Payer: Medicare Other

## 2018-05-11 ENCOUNTER — Encounter (HOSPITAL_COMMUNITY): Admission: RE | Disposition: E | Payer: Self-pay | Source: Home / Self Care | Attending: Vascular Surgery

## 2018-05-11 ENCOUNTER — Encounter (HOSPITAL_COMMUNITY): Payer: Self-pay | Admitting: Vascular Surgery

## 2018-05-11 DIAGNOSIS — K559 Vascular disorder of intestine, unspecified: Secondary | ICD-10-CM | POA: Diagnosis not present

## 2018-05-11 DIAGNOSIS — R578 Other shock: Secondary | ICD-10-CM

## 2018-05-11 DIAGNOSIS — K55059 Acute (reversible) ischemia of intestine, part and extent unspecified: Secondary | ICD-10-CM

## 2018-05-11 DIAGNOSIS — E1151 Type 2 diabetes mellitus with diabetic peripheral angiopathy without gangrene: Secondary | ICD-10-CM | POA: Diagnosis not present

## 2018-05-11 DIAGNOSIS — J9601 Acute respiratory failure with hypoxia: Secondary | ICD-10-CM

## 2018-05-11 DIAGNOSIS — I10 Essential (primary) hypertension: Secondary | ICD-10-CM | POA: Diagnosis not present

## 2018-05-11 HISTORY — PX: LAPAROTOMY: SHX154

## 2018-05-11 HISTORY — PX: BOWEL RESECTION: SHX1257

## 2018-05-11 LAB — CBC
HCT: 34.7 % — ABNORMAL LOW (ref 36.0–46.0)
HCT: 24.3 % — ABNORMAL LOW (ref 36.0–46.0)
Hemoglobin: 11.1 g/dL — ABNORMAL LOW (ref 12.0–15.0)
Hemoglobin: 7.9 g/dL — ABNORMAL LOW (ref 12.0–15.0)
MCH: 30.7 pg (ref 26.0–34.0)
MCH: 31.2 pg (ref 26.0–34.0)
MCHC: 32 g/dL (ref 30.0–36.0)
MCHC: 32.5 g/dL (ref 30.0–36.0)
MCV: 96.1 fL (ref 80.0–100.0)
MCV: 96 fL (ref 80.0–100.0)
PLATELETS: 57 10*3/uL — AB (ref 150–400)
Platelets: 117 10*3/uL — ABNORMAL LOW (ref 150–400)
RBC: 3.61 MIL/uL — ABNORMAL LOW (ref 3.87–5.11)
RBC: 2.53 MIL/uL — ABNORMAL LOW (ref 3.87–5.11)
RDW: 13.7 % (ref 11.5–15.5)
RDW: 14.3 % (ref 11.5–15.5)
WBC: 13.9 10*3/uL — AB (ref 4.0–10.5)
WBC: 9 10*3/uL (ref 4.0–10.5)
nRBC: 0 % (ref 0.0–0.2)
nRBC: 0 % (ref 0.0–0.2)

## 2018-05-11 LAB — CBC WITH DIFFERENTIAL/PLATELET
Abs Immature Granulocytes: 0.03 10*3/uL (ref 0.00–0.07)
Basophils Absolute: 0 10*3/uL (ref 0.0–0.1)
Basophils Relative: 0 %
Eosinophils Absolute: 0 10*3/uL (ref 0.0–0.5)
Eosinophils Relative: 0 %
HEMATOCRIT: 27.3 % — AB (ref 36.0–46.0)
Hemoglobin: 8.6 g/dL — ABNORMAL LOW (ref 12.0–15.0)
Immature Granulocytes: 0 %
Lymphocytes Relative: 11 %
Lymphs Abs: 1.1 10*3/uL (ref 0.7–4.0)
MCH: 30.9 pg (ref 26.0–34.0)
MCHC: 31.5 g/dL (ref 30.0–36.0)
MCV: 98.2 fL (ref 80.0–100.0)
MONOS PCT: 3 %
Monocytes Absolute: 0.3 10*3/uL (ref 0.1–1.0)
Neutro Abs: 8.8 10*3/uL — ABNORMAL HIGH (ref 1.7–7.7)
Neutrophils Relative %: 86 %
Platelets: 71 10*3/uL — ABNORMAL LOW (ref 150–400)
RBC: 2.78 MIL/uL — ABNORMAL LOW (ref 3.87–5.11)
RDW: 13.9 % (ref 11.5–15.5)
WBC Morphology: INCREASED
WBC: 10.1 10*3/uL (ref 4.0–10.5)
nRBC: 0 % (ref 0.0–0.2)

## 2018-05-11 LAB — POCT I-STAT 7, (LYTES, BLD GAS, ICA,H+H)
Acid-base deficit: 5 mmol/L — ABNORMAL HIGH (ref 0.0–2.0)
Acid-base deficit: 10 mmol/L — ABNORMAL HIGH (ref 0.0–2.0)
Bicarbonate: 19 mmol/L — ABNORMAL LOW (ref 20.0–28.0)
Bicarbonate: 16.1 mmol/L — ABNORMAL LOW (ref 20.0–28.0)
Calcium, Ion: 1.05 mmol/L — ABNORMAL LOW (ref 1.15–1.40)
Calcium, Ion: 1.12 mmol/L — ABNORMAL LOW (ref 1.15–1.40)
HCT: 35 % — ABNORMAL LOW (ref 36.0–46.0)
HCT: 22 % — ABNORMAL LOW (ref 36.0–46.0)
Hemoglobin: 11.9 g/dL — ABNORMAL LOW (ref 12.0–15.0)
Hemoglobin: 7.5 g/dL — ABNORMAL LOW (ref 12.0–15.0)
O2 SAT: 91 %
O2 Saturation: 95 %
PCO2 ART: 35.7 mmHg (ref 32.0–48.0)
Patient temperature: 36.2
Potassium: 4.6 mmol/L (ref 3.5–5.1)
Potassium: 5.2 mmol/L — ABNORMAL HIGH (ref 3.5–5.1)
Sodium: 134 mmol/L — ABNORMAL LOW (ref 135–145)
Sodium: 133 mmol/L — ABNORMAL LOW (ref 135–145)
TCO2: 20 mmol/L — ABNORMAL LOW (ref 22–32)
TCO2: 17 mmol/L — ABNORMAL LOW (ref 22–32)
pCO2 arterial: 31.7 mmHg — ABNORMAL LOW (ref 32.0–48.0)
pH, Arterial: 7.382 (ref 7.350–7.450)
pH, Arterial: 7.261 — ABNORMAL LOW (ref 7.350–7.450)
pO2, Arterial: 74 mmHg — ABNORMAL LOW (ref 83.0–108.0)
pO2, Arterial: 70 mmHg — ABNORMAL LOW (ref 83.0–108.0)

## 2018-05-11 LAB — COMPREHENSIVE METABOLIC PANEL
ALBUMIN: 3.2 g/dL — AB (ref 3.5–5.0)
ALT: 60 U/L — ABNORMAL HIGH (ref 0–44)
ALT: 97 U/L — ABNORMAL HIGH (ref 0–44)
ALT: 281 U/L — ABNORMAL HIGH (ref 0–44)
ANION GAP: 12 (ref 5–15)
AST: 91 U/L — ABNORMAL HIGH (ref 15–41)
AST: 140 U/L — ABNORMAL HIGH (ref 15–41)
AST: 250 U/L — ABNORMAL HIGH (ref 15–41)
Albumin: 2.7 g/dL — ABNORMAL LOW (ref 3.5–5.0)
Albumin: 1.9 g/dL — ABNORMAL LOW (ref 3.5–5.0)
Alkaline Phosphatase: 49 U/L (ref 38–126)
Alkaline Phosphatase: 45 U/L (ref 38–126)
Alkaline Phosphatase: 32 U/L — ABNORMAL LOW (ref 38–126)
Anion gap: 11 (ref 5–15)
Anion gap: 11 (ref 5–15)
BILIRUBIN TOTAL: 0.7 mg/dL (ref 0.3–1.2)
BUN: 25 mg/dL — ABNORMAL HIGH (ref 8–23)
BUN: 37 mg/dL — ABNORMAL HIGH (ref 8–23)
BUN: 38 mg/dL — ABNORMAL HIGH (ref 8–23)
CALCIUM: 7.6 mg/dL — AB (ref 8.9–10.3)
CHLORIDE: 108 mmol/L (ref 98–111)
CO2: 19 mmol/L — AB (ref 22–32)
CO2: 18 mmol/L — ABNORMAL LOW (ref 22–32)
CO2: 20 mmol/L — ABNORMAL LOW (ref 22–32)
Calcium: 7.7 mg/dL — ABNORMAL LOW (ref 8.9–10.3)
Calcium: 6.5 mg/dL — ABNORMAL LOW (ref 8.9–10.3)
Chloride: 99 mmol/L (ref 98–111)
Chloride: 103 mmol/L (ref 98–111)
Creatinine, Ser: 2.4 mg/dL — ABNORMAL HIGH (ref 0.44–1.00)
Creatinine, Ser: 3.07 mg/dL — ABNORMAL HIGH (ref 0.44–1.00)
Creatinine, Ser: 3.12 mg/dL — ABNORMAL HIGH (ref 0.44–1.00)
GFR calc Af Amer: 24 mL/min — ABNORMAL LOW (ref >60)
GFR calc Af Amer: 18 mL/min — ABNORMAL LOW (ref >60)
GFR calc Af Amer: 18 mL/min — ABNORMAL LOW (ref >60)
GFR calc non Af Amer: 21 mL/min — ABNORMAL LOW (ref >60)
GFR calc non Af Amer: 15 mL/min — ABNORMAL LOW (ref >60)
GFR calc non Af Amer: 15 mL/min — ABNORMAL LOW (ref >60)
Glucose, Bld: 433 mg/dL — ABNORMAL HIGH (ref 70–99)
Glucose, Bld: 121 mg/dL — ABNORMAL HIGH (ref 70–99)
Glucose, Bld: 83 mg/dL (ref 70–99)
POTASSIUM: 5.4 mmol/L — AB (ref 3.5–5.1)
Potassium: 5.5 mmol/L — ABNORMAL HIGH (ref 3.5–5.1)
Potassium: 5.2 mmol/L — ABNORMAL HIGH (ref 3.5–5.1)
Sodium: 130 mmol/L — ABNORMAL LOW (ref 135–145)
Sodium: 132 mmol/L — ABNORMAL LOW (ref 135–145)
Sodium: 139 mmol/L (ref 135–145)
Total Bilirubin: 0.6 mg/dL (ref 0.3–1.2)
Total Bilirubin: 0.6 mg/dL (ref 0.3–1.2)
Total Protein: 5.7 g/dL — ABNORMAL LOW (ref 6.5–8.1)
Total Protein: 4.8 g/dL — ABNORMAL LOW (ref 6.5–8.1)
Total Protein: 3.5 g/dL — ABNORMAL LOW (ref 6.5–8.1)

## 2018-05-11 LAB — PREPARE RBC (CROSSMATCH)

## 2018-05-11 LAB — DIC (DISSEMINATED INTRAVASCULAR COAGULATION)PANEL
D-Dimer, Quant: 12.94 ug/mL-FEU — ABNORMAL HIGH (ref 0.00–0.50)
Fibrinogen: 313 mg/dL (ref 210–475)
INR: 1.5 — ABNORMAL HIGH (ref 0.8–1.2)
Platelets: 58 10*3/uL — ABNORMAL LOW (ref 150–400)
Prothrombin Time: 17.9 seconds — ABNORMAL HIGH (ref 11.4–15.2)
Smear Review: NONE SEEN
aPTT: 38 seconds — ABNORMAL HIGH (ref 24–36)

## 2018-05-11 LAB — AMYLASE: Amylase: 166 U/L — ABNORMAL HIGH (ref 28–100)

## 2018-05-11 LAB — TROPONIN I
Troponin I: <0.03 ng/mL (ref <0.03)
Troponin I: <0.03 ng/mL (ref <0.03)

## 2018-05-11 LAB — POCT I-STAT 4, (NA,K, GLUC, HGB,HCT)
Glucose, Bld: 111 mg/dL — ABNORMAL HIGH (ref 70–99)
HCT: 24 % — ABNORMAL LOW (ref 36.0–46.0)
HEMOGLOBIN: 8.2 g/dL — AB (ref 12.0–15.0)
Potassium: 5.2 mmol/L — ABNORMAL HIGH (ref 3.5–5.1)
Sodium: 134 mmol/L — ABNORMAL LOW (ref 135–145)

## 2018-05-11 LAB — GLUCOSE, CAPILLARY
GLUCOSE-CAPILLARY: 119 mg/dL — AB (ref 70–99)
Glucose-Capillary: 234 mg/dL — ABNORMAL HIGH (ref 70–99)
Glucose-Capillary: 159 mg/dL — ABNORMAL HIGH (ref 70–99)
Glucose-Capillary: 102 mg/dL — ABNORMAL HIGH (ref 70–99)

## 2018-05-11 LAB — POCT ACTIVATED CLOTTING TIME
Activated Clotting Time: 0 seconds
Activated Clotting Time: >1000 seconds

## 2018-05-11 LAB — LACTIC ACID, PLASMA: Lactic Acid, Venous: 3.7 mmol/L (ref 0.5–1.9)

## 2018-05-11 LAB — MAGNESIUM
Magnesium: 1.6 mg/dL — ABNORMAL LOW (ref 1.7–2.4)
Magnesium: 2.1 mg/dL (ref 1.7–2.4)

## 2018-05-11 SURGERY — LAPAROTOMY, EXPLORATORY
Anesthesia: General

## 2018-05-11 MED ORDER — SODIUM CHLORIDE 0.9 % IV SOLN
INTRAVENOUS | Status: AC
  Filled 2018-05-11: qty 1.2

## 2018-05-11 MED ORDER — SODIUM BICARBONATE 8.4 % IV SOLN
INTRAVENOUS | Status: AC
  Filled 2018-05-11: qty 50

## 2018-05-11 MED ORDER — SODIUM CHLORIDE 0.9% IV SOLUTION
Freq: Once | INTRAVENOUS | Status: AC
  Administered 2018-05-11: 19:00:00 via INTRAVENOUS

## 2018-05-11 MED ORDER — CALCIUM GLUCONATE-NACL 1-0.675 GM/50ML-% IV SOLN: 1.0000 g | Freq: Once | INTRAVENOUS | Status: DC

## 2018-05-11 MED ORDER — INSULIN ASPART 100 UNIT/ML ~~LOC~~ SOLN: 0.0000 [IU] | Freq: Three times a day (TID) | SUBCUTANEOUS | Status: DC

## 2018-05-11 MED ORDER — MIDAZOLAM HCL 2 MG/2ML IJ SOLN
2.0000 mg | INTRAMUSCULAR | Status: AC | PRN
  Administered 2018-05-12 – 2018-05-13 (×3): 2 mg via INTRAVENOUS
  Filled 2018-05-11 (×4): qty 2

## 2018-05-11 MED ORDER — SODIUM CHLORIDE 0.9 % IV BOLUS: 1000.0000 mL | Freq: Once | INTRAVENOUS | Status: DC

## 2018-05-11 MED ORDER — FENTANYL BOLUS VIA INFUSION
50.0000 ug | INTRAVENOUS | Status: DC | PRN
  Administered 2018-05-13 – 2018-05-15 (×4): 50 ug via INTRAVENOUS
  Filled 2018-05-11: qty 50

## 2018-05-11 MED ORDER — 0.9 % SODIUM CHLORIDE (POUR BTL) OPTIME
TOPICAL | Status: DC | PRN
  Administered 2018-05-11: 2000 mL

## 2018-05-11 MED ORDER — METFORMIN HCL 500 MG PO TABS: 500.0000 mg | ORAL_TABLET | Freq: Every day | ORAL | Status: DC

## 2018-05-11 MED ORDER — CALCIUM CHLORIDE 10 % IV SOLN
INTRAVENOUS | Status: DC | PRN
  Administered 2018-05-11: 400 mg via INTRAVENOUS
  Administered 2018-05-11: 200 mg via INTRAVENOUS
  Administered 2018-05-11 (×2): 300 mg via INTRAVENOUS
  Administered 2018-05-11: 500 mg via INTRAVENOUS
  Administered 2018-05-11: 300 mg via INTRAVENOUS

## 2018-05-11 MED ORDER — LACTATED RINGERS IV SOLN: INTRAVENOUS | Status: DC

## 2018-05-11 MED ORDER — METOPROLOL TARTRATE 25 MG PO TABS
25.0000 mg | ORAL_TABLET | Freq: Two times a day (BID) | ORAL | Status: DC
  Administered 2018-05-11: 25 mg via ORAL
  Filled 2018-05-11: qty 1

## 2018-05-11 MED ORDER — ALBUTEROL SULFATE HFA 108 (90 BASE) MCG/ACT IN AERS
INHALATION_SPRAY | RESPIRATORY_TRACT | Status: DC | PRN
  Administered 2018-05-11: 2 via RESPIRATORY_TRACT

## 2018-05-11 MED ORDER — SODIUM CHLORIDE 0.9 % IV SOLN
INTRAVENOUS | Status: DC
  Filled 2018-05-11: qty 30

## 2018-05-11 MED ORDER — BUPROPION HCL ER (SR) 150 MG PO TB12
150.0000 mg | ORAL_TABLET | Freq: Two times a day (BID) | ORAL | Status: DC
  Administered 2018-05-11: 150 mg via ORAL
  Filled 2018-05-11 (×2): qty 1

## 2018-05-11 MED ORDER — SODIUM CHLORIDE 0.9% IV SOLUTION
Freq: Once | INTRAVENOUS | Status: DC
  Administered 2018-05-11: 22:00:00 via INTRAVENOUS

## 2018-05-11 MED ORDER — HYDROCHLOROTHIAZIDE 25 MG PO TABS
25.0000 mg | ORAL_TABLET | Freq: Every day | ORAL | Status: DC
  Administered 2018-05-11: 25 mg via ORAL
  Filled 2018-05-11: qty 1

## 2018-05-11 MED ORDER — ASPIRIN EC 81 MG PO TBEC
81.0000 mg | DELAYED_RELEASE_TABLET | Freq: Every day | ORAL | Status: DC
  Administered 2018-05-11: 81 mg via ORAL
  Filled 2018-05-11: qty 1

## 2018-05-11 MED ORDER — HALOPERIDOL LACTATE 5 MG/ML IJ SOLN
2.0000 mg | INTRAMUSCULAR | Status: DC | PRN
  Administered 2018-05-11 (×2): 2 mg via INTRAVENOUS
  Filled 2018-05-11 (×2): qty 1

## 2018-05-11 MED ORDER — CHLORHEXIDINE GLUCONATE 0.12 % MT SOLN
15.0000 mL | Freq: Two times a day (BID) | OROMUCOSAL | Status: DC
  Administered 2018-05-12: 15 mL via OROMUCOSAL

## 2018-05-11 MED ORDER — LACTATED RINGERS IV SOLN
INTRAVENOUS | Status: DC | PRN
  Administered 2018-05-11: 22:00:00 via INTRAVENOUS

## 2018-05-11 MED ORDER — LIDOCAINE 2% (20 MG/ML) 5 ML SYRINGE
INTRAMUSCULAR | Status: AC
  Filled 2018-05-11: qty 5

## 2018-05-11 MED ORDER — MIDAZOLAM HCL 2 MG/2ML IJ SOLN
2.0000 mg | Freq: Once | INTRAMUSCULAR | Status: AC
  Administered 2018-05-11: 2 mg via INTRAVENOUS

## 2018-05-11 MED ORDER — INSULIN ASPART 100 UNIT/ML ~~LOC~~ SOLN
0.0000 [IU] | SUBCUTANEOUS | Status: DC
  Administered 2018-05-17 – 2018-05-19 (×7): 1 [IU] via SUBCUTANEOUS
  Administered 2018-05-20 (×2): 2 [IU] via SUBCUTANEOUS
  Administered 2018-05-20 – 2018-05-21 (×2): 1 [IU] via SUBCUTANEOUS
  Administered 2018-05-21 (×2): 2 [IU] via SUBCUTANEOUS
  Administered 2018-05-21 – 2018-05-22 (×3): 1 [IU] via SUBCUTANEOUS
  Administered 2018-05-22: 2 [IU] via SUBCUTANEOUS
  Administered 2018-05-22 – 2018-05-24 (×8): 1 [IU] via SUBCUTANEOUS
  Administered 2018-05-24: 2 [IU] via SUBCUTANEOUS
  Administered 2018-05-24 (×3): 1 [IU] via SUBCUTANEOUS
  Administered 2018-05-25: 2 [IU] via SUBCUTANEOUS
  Administered 2018-05-25: 1 [IU] via SUBCUTANEOUS
  Administered 2018-05-25: 2 [IU] via SUBCUTANEOUS
  Administered 2018-05-25: 1 [IU] via SUBCUTANEOUS
  Administered 2018-05-25 – 2018-05-26 (×7): 2 [IU] via SUBCUTANEOUS
  Administered 2018-05-27: 1 [IU] via SUBCUTANEOUS
  Administered 2018-05-27: 2 [IU] via SUBCUTANEOUS
  Administered 2018-05-27: 1 [IU] via SUBCUTANEOUS
  Administered 2018-05-27 – 2018-05-28 (×4): 2 [IU] via SUBCUTANEOUS
  Administered 2018-05-28: 1 [IU] via SUBCUTANEOUS
  Administered 2018-05-28: 2 [IU] via SUBCUTANEOUS
  Administered 2018-05-28 – 2018-05-29 (×5): 1 [IU] via SUBCUTANEOUS
  Administered 2018-05-29 (×2): 2 [IU] via SUBCUTANEOUS
  Administered 2018-05-30 (×2): 1 [IU] via SUBCUTANEOUS
  Administered 2018-05-30 (×2): 2 [IU] via SUBCUTANEOUS
  Administered 2018-05-30 – 2018-05-31 (×4): 1 [IU] via SUBCUTANEOUS
  Administered 2018-05-31: 2 [IU] via SUBCUTANEOUS
  Administered 2018-05-31 – 2018-06-01 (×5): 1 [IU] via SUBCUTANEOUS
  Administered 2018-06-02: 2 [IU] via SUBCUTANEOUS
  Administered 2018-06-02: 1 [IU] via SUBCUTANEOUS
  Administered 2018-06-02: 2 [IU] via SUBCUTANEOUS
  Administered 2018-06-02: 1 [IU] via SUBCUTANEOUS
  Administered 2018-06-02: 2 [IU] via SUBCUTANEOUS
  Administered 2018-06-03 (×3): 1 [IU] via SUBCUTANEOUS

## 2018-05-11 MED ORDER — ROCURONIUM BROMIDE 100 MG/10ML IV SOLN
INTRAVENOUS | Status: DC | PRN
  Administered 2018-05-11 (×3): 50 mg via INTRAVENOUS

## 2018-05-11 MED ORDER — MOMETASONE FURO-FORMOTEROL FUM 100-5 MCG/ACT IN AERO
2.0000 | INHALATION_SPRAY | Freq: Two times a day (BID) | RESPIRATORY_TRACT | Status: DC
  Administered 2018-05-11: 2 via RESPIRATORY_TRACT
  Filled 2018-05-11: qty 8.8

## 2018-05-11 MED ORDER — NOREPINEPHRINE 4 MG/250ML-% IV SOLN
0.0000 ug/min | INTRAVENOUS | Status: DC
  Administered 2018-05-11: 8 ug/min via INTRAVENOUS

## 2018-05-11 MED ORDER — ATORVASTATIN CALCIUM 80 MG PO TABS
80.0000 mg | ORAL_TABLET | Freq: Every day | ORAL | Status: DC
  Administered 2018-05-12 – 2018-06-03 (×21): 80 mg
  Filled 2018-05-11 (×21): qty 1

## 2018-05-11 MED ORDER — STERILE WATER FOR INJECTION IV SOLN
INTRAVENOUS | Status: DC
  Filled 2018-05-11 (×3): qty 850

## 2018-05-11 MED ORDER — FENTANYL CITRATE (PF) 250 MCG/5ML IJ SOLN
INTRAMUSCULAR | Status: AC
  Filled 2018-05-11: qty 5

## 2018-05-11 MED ORDER — FENTANYL CITRATE (PF) 100 MCG/2ML IJ SOLN
INTRAMUSCULAR | Status: AC
  Filled 2018-05-11: qty 2

## 2018-05-11 MED ORDER — MIDAZOLAM HCL 2 MG/2ML IJ SOLN
2.0000 mg | INTRAMUSCULAR | Status: DC | PRN
  Administered 2018-05-12 – 2018-05-13 (×4): 2 mg via INTRAVENOUS
  Filled 2018-05-11 (×3): qty 2

## 2018-05-11 MED ORDER — VANCOMYCIN HCL IN DEXTROSE 1-5 GM/200ML-% IV SOLN
INTRAVENOUS | Status: AC
  Filled 2018-05-11: qty 200

## 2018-05-11 MED ORDER — ALBUMIN HUMAN 5 % IV SOLN
INTRAVENOUS | Status: DC | PRN
  Administered 2018-05-11 (×2): via INTRAVENOUS

## 2018-05-11 MED ORDER — NOREPINEPHRINE 4 MG/250ML-% IV SOLN
INTRAVENOUS | Status: AC
  Administered 2018-05-11: 22:00:00
  Filled 2018-05-11: qty 250

## 2018-05-11 MED ORDER — ALBUTEROL SULFATE HFA 108 (90 BASE) MCG/ACT IN AERS
INHALATION_SPRAY | RESPIRATORY_TRACT | Status: AC
  Filled 2018-05-11: qty 6.7

## 2018-05-11 MED ORDER — CALCIUM CHLORIDE 10 % IV SOLN
INTRAVENOUS | Status: AC
  Filled 2018-05-11: qty 10

## 2018-05-11 MED ORDER — ETOMIDATE 2 MG/ML IV SOLN
20.0000 mg | Freq: Once | INTRAVENOUS | Status: AC
  Administered 2018-05-11: 20 mg via INTRAVENOUS

## 2018-05-11 MED ORDER — VENLAFAXINE HCL ER 150 MG PO CP24
150.0000 mg | ORAL_CAPSULE | Freq: Every day | ORAL | Status: DC
  Administered 2018-05-11: 150 mg via ORAL
  Filled 2018-05-11: qty 1

## 2018-05-11 MED ORDER — FENTANYL CITRATE (PF) 250 MCG/5ML IJ SOLN
INTRAMUSCULAR | Status: DC | PRN
  Administered 2018-05-11 (×5): 50 ug via INTRAVENOUS

## 2018-05-11 MED ORDER — FENTANYL CITRATE (PF) 100 MCG/2ML IJ SOLN
50.0000 ug | Freq: Once | INTRAMUSCULAR | Status: AC
  Administered 2018-05-11: 50 ug via INTRAVENOUS

## 2018-05-11 MED ORDER — ATORVASTATIN CALCIUM 80 MG PO TABS
80.0000 mg | ORAL_TABLET | Freq: Every day | ORAL | Status: DC
  Administered 2018-05-11: 80 mg via ORAL
  Filled 2018-05-11: qty 1

## 2018-05-11 MED ORDER — ROCURONIUM BROMIDE 50 MG/5ML IV SOLN
50.0000 mg | Freq: Once | INTRAVENOUS | Status: AC
  Administered 2018-05-11: 50 mg via INTRAVENOUS
  Filled 2018-05-11: qty 5

## 2018-05-11 MED ORDER — ROCURONIUM BROMIDE 50 MG/5ML IV SOSY
PREFILLED_SYRINGE | INTRAVENOUS | Status: AC
  Filled 2018-05-11: qty 15

## 2018-05-11 MED ORDER — FENTANYL 2500MCG IN NS 250ML (10MCG/ML) PREMIX INFUSION
25.0000 ug/h | INTRAVENOUS | Status: DC
  Administered 2018-05-12 (×2): 50 ug/h via INTRAVENOUS
  Administered 2018-05-13: 100 ug/h via INTRAVENOUS
  Administered 2018-05-14 – 2018-05-15 (×2): 175 ug/h via INTRAVENOUS
  Administered 2018-05-16: 100 ug/h via INTRAVENOUS
  Administered 2018-05-16: 180 ug/h via INTRAVENOUS
  Administered 2018-05-17: 100 ug/h via INTRAVENOUS
  Filled 2018-05-11 (×9): qty 250

## 2018-05-11 MED ORDER — ENOXAPARIN SODIUM 30 MG/0.3ML ~~LOC~~ SOLN: 30.0000 mg | SUBCUTANEOUS | Status: DC

## 2018-05-11 MED ORDER — VANCOMYCIN HCL 1000 MG IV SOLR
INTRAVENOUS | Status: DC | PRN
  Administered 2018-05-11: 1000 mg via INTRAVENOUS

## 2018-05-11 MED ORDER — SODIUM CHLORIDE 0.9 % IV SOLN
INTRAVENOUS | Status: DC | PRN
  Administered 2018-05-11: 22:00:00 via INTRAVENOUS

## 2018-05-11 MED ORDER — PANTOPRAZOLE SODIUM 40 MG IV SOLR
40.0000 mg | Freq: Every day | INTRAVENOUS | Status: DC
  Administered 2018-05-12 – 2018-05-23 (×12): 40 mg via INTRAVENOUS
  Filled 2018-05-11 (×13): qty 40

## 2018-05-11 MED ORDER — SODIUM BICARBONATE 8.4 % IV SOLN
INTRAVENOUS | Status: DC | PRN
  Administered 2018-05-11 (×2): 50 meq via INTRAVENOUS

## 2018-05-11 MED ORDER — MIDAZOLAM HCL 2 MG/2ML IJ SOLN
INTRAMUSCULAR | Status: AC
  Administered 2018-05-11: 22:00:00
  Filled 2018-05-11: qty 2

## 2018-05-11 MED ORDER — ORAL CARE MOUTH RINSE
15.0000 mL | Freq: Two times a day (BID) | OROMUCOSAL | Status: DC
  Administered 2018-05-12 (×2): 15 mL via OROMUCOSAL

## 2018-05-11 MED ORDER — SODIUM CHLORIDE 0.9 % IV SOLN
INTRAVENOUS | Status: DC
  Administered 2018-05-11 (×2): via INTRAVENOUS

## 2018-05-11 MED ORDER — SODIUM CHLORIDE 0.9 % IV SOLN
INTRAVENOUS | Status: DC | PRN
  Administered 2018-05-11: 500 mL

## 2018-05-11 MED ORDER — SODIUM BICARBONATE 8.4 % IV SOLN
INTRAVENOUS | Status: AC
  Administered 2018-05-11: 22:00:00
  Filled 2018-05-11: qty 150

## 2018-05-11 SURGICAL SUPPLY — 56 items
AGENT HMST SPONGE THK3/8 (HEMOSTASIS)
CANISTER SUCT 3000ML PPV (MISCELLANEOUS) ×4 IMPLANT
CANNULA VESSEL 3MM 2 BLNT TIP (CANNULA) ×2 IMPLANT
CLIP VESOCCLUDE MED 24/CT (CLIP) ×4 IMPLANT
CLIP VESOCCLUDE SM WIDE 24/CT (CLIP) ×4 IMPLANT
COVER WAND RF STERILE (DRAPES) ×4 IMPLANT
DRSG VAC ATS LRG SENSATRAC (GAUZE/BANDAGES/DRESSINGS) ×2 IMPLANT
ELECT BLADE 4.0 EZ CLEAN MEGAD (MISCELLANEOUS) ×4
ELECT BLADE 6.5 EXT (BLADE) IMPLANT
ELECT REM PT RETURN 9FT ADLT (ELECTROSURGICAL) ×4
ELECTRODE BLDE 4.0 EZ CLN MEGD (MISCELLANEOUS) ×2 IMPLANT
ELECTRODE REM PT RTRN 9FT ADLT (ELECTROSURGICAL) ×2 IMPLANT
GLOVE BIO SURGEON STRL SZ7.5 (GLOVE) ×4 IMPLANT
GLOVE BIOGEL PI IND STRL 8 (GLOVE) ×2 IMPLANT
GLOVE BIOGEL PI INDICATOR 8 (GLOVE) ×2
GOWN STRL REUS W/ TWL LRG LVL3 (GOWN DISPOSABLE) ×6 IMPLANT
GOWN STRL REUS W/ TWL XL LVL3 (GOWN DISPOSABLE) ×2 IMPLANT
GOWN STRL REUS W/TWL LRG LVL3 (GOWN DISPOSABLE) ×12
GOWN STRL REUS W/TWL XL LVL3 (GOWN DISPOSABLE) ×4
HANDLE SUCTION POOLE (INSTRUMENTS) IMPLANT
HEMOSTAT SPONGE AVITENE ULTRA (HEMOSTASIS) IMPLANT
INSERT FOGARTY 61MM (MISCELLANEOUS) ×8 IMPLANT
INSERT FOGARTY SM (MISCELLANEOUS) ×16 IMPLANT
KIT BASIN OR (CUSTOM PROCEDURE TRAY) ×4 IMPLANT
KIT TURNOVER KIT B (KITS) ×4 IMPLANT
LIGASURE IMPACT 36 18CM CVD LR (INSTRUMENTS) ×2 IMPLANT
NS IRRIG 1000ML POUR BTL (IV SOLUTION) ×8 IMPLANT
PACK AORTA (CUSTOM PROCEDURE TRAY) ×4 IMPLANT
PAD ARMBOARD 7.5X6 YLW CONV (MISCELLANEOUS) ×8 IMPLANT
RELOAD PROXIMATE 75MM BLUE (ENDOMECHANICALS) ×12 IMPLANT
RELOAD STAPLE 75 3.8 BLU REG (ENDOMECHANICALS) IMPLANT
RETAINER VISCERA MED (MISCELLANEOUS) ×4 IMPLANT
STAPLER CUT CVD 40MM GREEN (STAPLE) ×2 IMPLANT
STAPLER PROXIMATE 75MM BLUE (STAPLE) ×2 IMPLANT
STAPLER VISISTAT 35W (STAPLE) ×8 IMPLANT
SUCTION POOLE HANDLE (INSTRUMENTS) ×4
SUT ETHIBOND 5 LR DA (SUTURE) IMPLANT
SUT PDS AB 1 TP1 54 (SUTURE) ×4 IMPLANT
SUT PDS AB 1 TP1 96 (SUTURE) ×8 IMPLANT
SUT PROLENE 2 0 SH DA (SUTURE) ×2 IMPLANT
SUT PROLENE 3 0 SH1 36 (SUTURE) ×6 IMPLANT
SUT PROLENE 5 0 C 1 24 (SUTURE) IMPLANT
SUT PROLENE 5 0 C 1 36 (SUTURE) IMPLANT
SUT SILK 0 TIES 10X30 (SUTURE) ×2 IMPLANT
SUT SILK 2 0 SH CR/8 (SUTURE) ×4 IMPLANT
SUT SILK 2 0 TIES 10X30 (SUTURE) ×2 IMPLANT
SUT SILK 3 0 TIES 17X18 (SUTURE) ×4
SUT SILK 3-0 18XBRD TIE BLK (SUTURE) IMPLANT
SUT VIC AB 2-0 CT1 36 (SUTURE) ×8 IMPLANT
SUT VIC AB 3-0 SH 27 (SUTURE) ×4
SUT VIC AB 3-0 SH 27X BRD (SUTURE) IMPLANT
TAPE UMBILICAL COTTON 1/8X30 (MISCELLANEOUS) ×2 IMPLANT
TOWEL GREEN STERILE (TOWEL DISPOSABLE) ×4 IMPLANT
TOWEL SURG RFD BLUE STRL DISP (DISPOSABLE) ×8 IMPLANT
TRAY FOLEY MTR SLVR 16FR STAT (SET/KITS/TRAYS/PACK) ×4 IMPLANT
WATER STERILE IRR 1000ML POUR (IV SOLUTION) ×8 IMPLANT

## 2018-05-11 NOTE — Progress Notes (Signed)
Inpatient Diabetes Program Recommendations  AACE/ADA: New Consensus Statement on Inpatient Glycemic Control (2015)  Target Ranges:  Prepandial:   less than 140 mg/dL      Peak postprandial:   less than 180 mg/dL (1-2 hours)      Critically ill patients:  140 - 180 mg/dL   Lab Results  Component Value Date   GLUCAP 234 (H) 04/29/2018   HGBA1C 6.4 (H) 05/02/2018    Review of Glycemic Control Results for MORRIGHAN, OREILLY (MRN [id number]) as of 04/22/2018 09:51  Ref. Range 05/01/2018 15:38 04/26/2018 21:35 04/19/2018 07:06  Glucose-Capillary Latest Ref Range: 70 - 99 mg/dL 169 (H) 678 (H) 938 (H)   Diabetes history: Type 2 DM Outpatient Diabetes medications: Metformin 500 mg QAM Current orders for Inpatient glycemic control: Novolog 0-15 units TID  Inpatient Diabetes Program Recommendations:    Noted consult for AM glucose serum of 433 mg/dL. Noted insulin changes.   D5% at 125 ml/hr was stopped and transitioned to 0.9% sodium chloride. However, would not associate this with such significant increase to AM serum.  Renal function has changed from patient's baseline creatinine of 0.75 to 2.40 and also noted increased liver function.  Patient was receiving Vancomycin, nephrotoxic? Also, question if aortic occlusion impacted perfusion? Nephrology perspective?  Seems appropriate to watch trends at this time. If renal status continues, may want to consider change correction back to Novolog 0-9 units TID to avoid hypoglycemia.  Will continue to follow.   Thanks, Lujean Rave, MSN, RNC-OB Diabetes Coordinator [phone] (8a-5p)

## 2018-05-11 NOTE — Progress Notes (Signed)
MD paged regarding bolus response from patient of 10 cc of UOP.    Orders for labs placed.    RN will continue to monitor.

## 2018-05-11 NOTE — Progress Notes (Addendum)
Patient kept desating and became more and more hypotensive. And Got intubated. Drugs given  2 mg Versed, 20 mg Etomidate, 50 mg Rocuronium. Patient is being transfused first bag of PRBC and labs are sent.

## 2018-05-11 NOTE — Evaluation (Signed)
Physical Therapy Evaluation Patient Details Name: Gwendolyn Pham MRN: [id number] DOB: [birth year] Today's Date: 05/05/2018   History of Present Illness  Pt is a 63 year old woman admitted 05/08/2018 with aortic occlusion, now s/p aortobifemoral BPG. PMH: smoker, anxiety, depression, DM, asthma, HTN.  Clinical Impression  Pt admitted with above. Prior to admission, pt living alone and is independent. On PT evaluation, presenting with decreased safety awareness (distracted by pain). Performing bed to chair transfer with min assist. Suspect pt will progress well with mobility given pain control and management. Will continue to follow acutely to progress mobility as tolerated.    Follow Up Recommendations Home health PT;Supervision/Assistance - 24 hour    Equipment Recommendations  Rolling walker with 5" wheels;3in1 (PT)    Recommendations for Other Services       Precautions / Restrictions Precautions Precautions: Fall Precaution Comments: educated in how to splint abdoment with pillow Restrictions Weight Bearing Restrictions: No      Mobility  Bed Mobility Overal bed mobility: Needs Assistance Bed Mobility: Rolling;Sidelying to Sit Rolling: Min assist Sidelying to sit: Min assist       General bed mobility comments: educated in log roll technique while splinting abdomen with pillow to minimize abdominal pain  Transfers Overall transfer level: Needs assistance Equipment used: Rolling walker (2 wheeled) Transfers: Sit to/from [employer] Sit to Stand: Min assist;+2 safety/equipment Stand pivot transfers: +2 safety/equipment;Min assist       General transfer comment: assist for stability, cues for technique, increased time  Ambulation/Gait                Stairs            Wheelchair Mobility    Modified Rankin (Stroke Patients Only)       Balance Overall balance assessment: Needs assistance   Sitting balance-Leahy Scale: Fair        Standing balance-Leahy Scale: Poor                               Pertinent Vitals/Pain Pain Assessment: Faces Faces Pain Scale: Hurts whole lot Pain Location: abdomen Pain Descriptors / Indicators: Aching;Grimacing;Guarding Pain Intervention(s): Monitored during session;Premedicated before session;Repositioned    Home Living Family/patient expects to be discharged to:: Private residence Living Arrangements: Alone Available Help at Discharge: Family;Available 24 hours/day(sister) Type of Home: House Home Access: Stairs to enter Entrance Stairs-Rails: Right Entrance Stairs-Number of Steps: 5 Home Layout: One level Home Equipment: None Additional Comments: lives with her dog and cat    Prior Function Level of Independence: Independent               Hand Dominance   Dominant Hand: Right    Extremity/Trunk Assessment   Upper Extremity Assessment Upper Extremity Assessment: Overall WFL for tasks assessed    Lower Extremity Assessment Lower Extremity Assessment: Overall WFL for tasks assessed    Cervical / Trunk Assessment Cervical / Trunk Assessment: Normal  Communication   Communication: No difficulties  Cognition Arousal/Alertness: Awake/alert Behavior During Therapy: Anxious;Flat affect;Impulsive;Restless Overall Cognitive Status: Impaired/Different from baseline Area of Impairment: Safety/judgement;Following commands;Problem solving;Attention                   Current Attention Level: Sustained   Following Commands: Follows one step commands with increased time Safety/Judgement: Decreased awareness of safety;Decreased awareness of deficits   Problem Solving: Slow processing;Decreased initiation;Difficulty sequencing;Requires verbal cues;Requires tactile cues General Comments: pt pulling at  lines, attempting to stand up once seated in chair      General Comments      Exercises     Assessment/Plan    PT Assessment Patient needs  continued PT services  PT Problem List Decreased activity tolerance;Decreased balance;Decreased mobility;Pain;Cardiopulmonary status limiting activity;Decreased safety awareness       PT Treatment Interventions DME instruction;Gait training;Stair training;Functional mobility training;Therapeutic activities;Therapeutic exercise;Balance training;Patient/family education    PT Goals (Current goals can be found in the Care Plan section)  Acute Rehab PT Goals Patient Stated Goal: to return home to her pets, pain relief PT Goal Formulation: With patient Time For Goal Achievement: 05/25/18 Potential to Achieve Goals: Good    Frequency Min 3X/week   Barriers to discharge        Co-evaluation PT/OT/SLP Co-Evaluation/Treatment: Yes Reason for Co-Treatment: For patient/therapist safety;To address functional/ADL transfers PT goals addressed during session: Mobility/safety with mobility OT goals addressed during session: ADL's and self-care       AM-PAC PT "6 Clicks" Mobility  Outcome Measure Help needed turning from your back to your side while in a flat bed without using bedrails?: None Help needed moving from lying on your back to sitting on the side of a flat bed without using bedrails?: A Little Help needed moving to and from a bed to a chair (including a wheelchair)?: A Little Help needed standing up from a chair using your arms (e.g., wheelchair or bedside chair)?: A Little Help needed to walk in hospital room?: A Little Help needed climbing 3-5 steps with a railing? : A Lot 6 Click Score: 18    End of Session Equipment Utilized During Treatment: Oxygen Activity Tolerance: Patient tolerated treatment well Patient left: in chair;with call bell/phone within reach;with chair alarm set Nurse Communication: Mobility status PT Visit Diagnosis: Unsteadiness on feet (R26.81);Pain;Difficulty in walking, not elsewhere classified (R26.2) Pain - part of body: (abdomen)    Time:  6381-7711 PT Time Calculation (min) (ACUTE ONLY): 24 min   Charges:   PT Evaluation $PT Eval Moderate Complexity: 1 Mod         Laurina Bustle, [place], DPT Acute Rehabilitation Services Pager [phone] Office [phone]   Vanetta Mulders 05/05/2018, 11:08 AM

## 2018-05-11 NOTE — Progress Notes (Signed)
Patient remains hypotensive and now on levophed after aggressive resuscitation.  Re-intubated for tachypnea.  Hgb now 7.9 after 1 upRBC from 8.6 earlier this evening and 11 this am.  Abdomen more distended.  Will plan to take back to OR for exploratory laparotomy and concern for bleeding.   Cephus Shelling, MD [facility] [place] Office: [phone] Pager: [phone]  Cephus Shelling

## 2018-05-11 NOTE — Anesthesia Preprocedure Evaluation (Addendum)
Anesthesia Evaluation  Patient identified by MRN, date of birth, ID band Patient unresponsive    Reviewed: Allergy & Precautions, Patient's Chart, lab work & pertinent test resultsPreop documentation limited or incomplete due to emergent nature of procedure.  Airway Mallampati: Intubated       Dental   Pulmonary asthma , COPD, Current Smoker,       + intubated    Cardiovascular hypertension, Pt. on home beta blockers + Peripheral Vascular Disease  Normal cardiovascular exam  ECG: rate 98. Sinus rhythm with short PR with Premature supraventricular complexes   Neuro/Psych PSYCHIATRIC DISORDERS Anxiety Depression negative neurological ROS     GI/Hepatic negative GI ROS, Neg liver ROS,   Endo/Other  diabetes, Oral Hypoglycemic Agents  Renal/GU ARFRenal disease     Musculoskeletal negative musculoskeletal ROS (+)   Abdominal   Peds  Hematology  (+) Blood dyscrasia, anemia , HLD Thrombocytopenia  DIC   Anesthesia Other Findings expected hypovolemic shock  Reproductive/Obstetrics                            Anesthesia Physical Anesthesia Plan  ASA: IV and emergent  Anesthesia Plan: General   Post-op Pain Management:    Induction: Intravenous  PONV Risk Score and Plan: 2 and Treatment may vary due to age or medical condition  Airway Management Planned: Oral ETT  Additional Equipment: Arterial line  Intra-op Plan:   Post-operative Plan: Post-operative intubation/ventilation  Informed Consent:   Plan Discussed with: CRNA and Surgeon  Anesthesia Plan Comments:        Anesthesia Quick Evaluation

## 2018-05-11 NOTE — Consult Note (Addendum)
NAME:  Gwendolyn Pham, MRN:  [id number], DOB:  27-Nov-1955, LOS: 1 ADMISSION DATE:  04/28/2018, CONSULTATION DATE:  04/21/2018 REFERRING MD:  Chestine Spore  CHIEF COMPLAINT:  Hypotension   Brief History   Gwendolyn Pham is a 63 y.o. female who was admitted 2/26 for aorto femoral bypass.  2/27 had hypotension and respiratory distress.  Intubated that night and getting transfused 2u PRBC.  Vascular surgery following and planning to take back to OR if hypotension does not resolve with blood products.  History of present illness   Pt is encephelopathic; therefore, this HPI is obtained from chart review. Gwendolyn Pham is a 63 y.o. female who has a PMH as outlined below (see "past medical history").  She presented to [facility]/26 for aortofemoral bypass due to occlusive disease.  Procedure was completed without complications.  During afternoon hours of 2/27, she sat up in bed to try and use the commode and shortly thereafter, she became hypotensive.  She was given 3L IVF bolus but BP remained soft.  Hgb had dropped from 11.1 to 8.6.  Vascular surgery was called who felt that pt needed to be intubated while further workup continued.  PCCM was called in consultation.  At the time of our evaluation, pt is awake but is in distress.  She is pale appearing.  SBP in the 80s.  She has had very low UOP, essentially anuric.  She is currently receiving 1u PRBC.  Past Medical History  PVD, HTN, HLD, DM, depression, COPD, asthma, anxiety.  Significant Hospital Events   2/26 > admit, taken to OR for aortofemoral bypass. 2/27 > intubated.  Consults:  PCCM.  Procedures:  ETT 2/27 >  Art line pending 2/27 >   Significant Diagnostic Tests:  CXR 2/27 > mild atelectasis. Renal US 2/28 >   Micro Data:  None.  Antimicrobials:  None.   Interim history/subjective:  Awake but in distress. Pale appearing.  Objective:  Blood pressure (!) 81/44, pulse (!) 101, temperature (!) 95.1 F (35.1 C), temperature source Axillary,  resp. rate (!) 41, height 5\' 4"  (1.626 m), weight 61.7 kg, SpO2 94 %. PAP: (10-12)/(2-5) 12/5      Intake/Output Summary (Last 24 hours) at 04/26/2018 1947 Last data filed at 04/19/2018 1924 Gross per 24 hour  Intake 2411.23 ml  Output 561 ml  Net 1850.23 ml   Filed Weights   04/21/2018 0543 04/18/2018 1700  Weight: 61.7 kg 61.7 kg    Examination: General: Adult female, in distress. Neuro: Awake, follows basic commands.Marland Kitchen HEENT: [place]/AT. Sclerae anicteric.  EOMI.  MM dry. Cardiovascular: RRR, no M/R/G.  Lungs: Respirations even and unlabored.  CTA bilaterally, No W/R/R.  Abdomen: Bruising to lower abdomen and bilateral groins.  BS x 4, soft, NT/ND.  Musculoskeletal: No gross deformities, no edema.  Skin: Pale, Intact, warm, no rashes. + skin turgor.  Assessment & Plan:   Respiratory insufficiency. - Intubate now. - Assess ABG 1 hour post intubation. - Bronchial hygiene. - Follow CXR.  Shock - concern for hemorrhagic + hypovolemic.  No indications of sepsis at this time. - Start levophed, goal MAP > 65. - 1u PRBC transfusing now, will transfuse 1 more after. - Assess CVP's, goal > 10. - Vascular surgery on board, if no response to blood then will plan to take back to OR for a re-look. - Repeat labs now. - Assess blood cultures for completeness. - D/c aspirin, lovenox.  AKI - now essentially anuric. - Continue fluids. - Assess  renal US. - Might need nephrology consult by AM.  Hypocalcemia. - 1g Ca gluconate.  Transaminitis - presumed due to hypotension / low flow. - Trend LFT's.  Anemia - as above, concern for hemorrhage. Thrombocytopenia - unclear etiology, ? Consumptive coagulopathy if truly bleeding, ? DIC. - Continue PRBC transfusions (2u total). - Repeat labs. - F/u on DIC panel.  Hx HTN, HLD. -  Continue preadmission atorvastatin. - hold preadmission HCTZ, lopressor.  Hx DM. - SSI. - Hold preadmission metformin.  Hx depression, anxiety. - Hold  preadmission bupropion, venlafaxine.   Best Practice:  Diet: NPO. Pain/Anxiety/Delirium protocol (if indicated): fentanyl gtt / midazolam PRN.  RASS goal 0 to -1. VAP protocol (if indicated): In place. DVT prophylaxis: SCD's only. GI prophylaxis: PPI. Glucose control: SSI. Mobility: Bedrest. Code Status: Full. Family Communication: Sister updated at bedside. Disposition: ICU.  Labs   CBC: Recent Labs  Lab 05/08/2018 1207 11-17-2018 0057 11-17-2018 0508 11-17-2018 1802 11-17-2018 1814 11-17-2018 1836  WBC 17.0*  --  13.9* 10.1  --   --   NEUTROABS  --   --   --  8.8*  --   --   HGB 11.3* 11.9* 11.1* 8.6* 8.2* 7.5*  HCT 35.6* 35.0* 34.7* 27.3* 24.0* 22.0*  MCV 97.5  --  96.1 98.2  --   --   PLT 158  --  117* 71*  --   --    Basic Metabolic Panel: Recent Labs  Lab 04/29/2018 1044 04/25/2018 1207 11-17-2018 0057 11-17-2018 0508 11-17-2018 1613 11-17-2018 1814 11-17-2018 1836  NA 135 135 134* 130* 132* 134* 133*  K 3.7 4.0 4.6 5.5* 5.2* 5.2* 5.2*  CL  --  104  --  99 103  --   --   CO2  --  22  --  19* 18*  --   --   GLUCOSE 137* 177*  --  433* 121* 111*  --   BUN  --  13  --  25* 37*  --   --   CREATININE  --  1.11*  --  2.40* 3.07*  --   --   CALCIUM  --  7.9*  --  7.7* 7.6*  --   --   MG  --  1.9  --  1.6*  --   --   --    GFR: Estimated Creatinine Clearance: 16.2 mL/min (A) (by C-G formula based on SCr of 3.07 mg/dL (H)). Recent Labs  Lab 04/28/2018 1207 11-17-2018 0508 11-17-2018 1802  WBC 17.0* 13.9* 10.1   Liver Function Tests: Recent Labs  Lab 11-17-2018 0508 11-17-2018 1613  AST 91* 140*  ALT 60* 97*  ALKPHOS 49 45  BILITOT 0.6 0.7  PROT 5.7* 4.8*  ALBUMIN 3.2* 2.7*   Recent Labs  Lab 11-17-2018 0508  AMYLASE 166*   No results for input(s): AMMONIA in the last 168 hours. ABG    Component Value Date/Time   PHART 7.261 (L) 09-04-202020 1836   PCO2ART 35.7 09-04-202020 1836   PO2ART 70.0 (L) 09-04-202020 1836   HCO3 16.1 (L) 09-04-202020 1836   TCO2 17 (L) 09-04-202020 1836    ACIDBASEDEF 10.0 (H) 09-04-202020 1836   O2SAT 91.0 09-04-202020 1836    Coagulation Profile: Recent Labs  Lab 04/23/2018 1207  INR 1.1   Cardiac Enzymes: Recent Labs  Lab 11-17-2018 1810  TROPONINI <0.03   HbA1C: Hgb A1c MFr Bld  Date/Time Value Ref Range Status  05/02/2018 10:28 AM 6.4 (H) 4.8 - 5.6 %  Final    Comment:    (NOTE)         Prediabetes: 5.7 - 6.4         Diabetes: >6.4         Glycemic control for adults with diabetes: <7.0   01/23/2018 12:18 PM 6.4 (H) 4.8 - 5.6 % Final    Comment:             Prediabetes: 5.7 - 6.4          Diabetes: >6.4          Glycemic control for adults with diabetes: <7.0    CBG: Recent Labs  Lab 05/07/2018 2135 05/01/2018 0706 04/29/2018 1037 04/19/2018 1212 05/06/2018 1605  GLUCAP 309* 234* 159* 119* 102*    Review of Systems:   Unable to obtain as pt is encephalopathic.  Past medical history  She,  has a past medical history of Anxiety, Asthma, COPD (chronic obstructive pulmonary disease) (HCC), Depression, Diabetes mellitus without complication (HCC), Hyperlipidemia, and Hypertension.   Surgical History    Past Surgical History:  Procedure Laterality Date  . AORTA - BILATERAL FEMORAL ARTERY BYPASS GRAFT N/A 04/17/2018   Procedure: AORTA BIFEMORAL BYPASS GRAFT;  Surgeon: Larina Earthly, MD;  Location: [facility] OR;  Service: Vascular;  Laterality: N/A;  . DILATION AND CURETTAGE OF UTERUS    . INNER EAR SURGERY       Social History   reports that she has been smoking cigarettes. She has a 25.00 pack-year smoking history. She has never used smokeless tobacco. She reports that she does not drink alcohol or use drugs.   Family history   Her family history includes Arthritis in her sister; COPD in her father; Diabetes in her father, mother, sister, and sister; Heart disease (age of onset: 59) in her father; Hypertension in her mother; Mental illness in her sister.   Allergies Allergies  Allergen Reactions  . Lisinopril Cough    Muscle  pain   . Penicillins Rash    Did it involve swelling of the face/tongue/throat, SOB, or low BP? No Did it involve sudden or severe rash/hives, skin peeling, or any reaction on the inside of your mouth or nose? No Did you need to seek medical attention at a hospital or doctor's office? No When did it last happen?Longer than 10 years ago If all above answers are "NO", may proceed with cephalosporin use.      Home meds  Prior to Admission medications   Medication Sig Start Date End Date Taking? Authorizing Provider  acetaminophen (TYLENOL) 325 MG tablet Take 650 mg by mouth every 6 (six) hours as needed for moderate pain.    Yes [provider]  aspirin EC 81 MG tablet Take 1 tablet (81 mg total) by mouth daily. 04/20/18  Yes Patwardhan, Manish J, MD  atorvastatin (LIPITOR) 80 MG tablet Take 1 tablet (80 mg total) by mouth daily. 04/14/18  Yes Myles Lipps, MD  buPROPion [facility] SR) 150 MG 12 hr tablet Take 1 tablet (150 mg total) by mouth 2 (two) times daily. 01/23/18  Yes Myles Lipps, MD  Fluticasone-Salmeterol (ADVAIR) 100-50 MCG/DOSE AEPB Inhale 1 puff into the lungs 2 (two) times daily.   Yes [provider]  hydrochlorothiazide (HYDRODIURIL) 25 MG tablet Take 1 tablet (25 mg total) by mouth daily. 01/23/18  Yes Myles Lipps, MD  metFORMIN (GLUCOPHAGE) 500 MG tablet Take 1 tablet (500 mg total) by mouth daily with breakfast. 01/23/18  Yes  Myles Lipps, MD  metoprolol tartrate (LOPRESSOR) 25 MG tablet Take 1 tablet (25 mg total) by mouth 2 (two) times daily. 01/23/18  Yes Myles Lipps, MD  venlafaxine XR (EFFEXOR-XR) 150 MG 24 hr capsule TAKE 1 CAPSULE BY MOUTH EVERY DAY WITH BREAKFAST Patient taking differently: Take 150 mg by mouth daily with breakfast.  02/10/18  Yes Myles Lipps, MD    Critical care time: 45 min.    Rutherford Guys, PA [facility] Pager: [phone].  If no answer, [phone]  [id number] 04/30/2018, 7:47 PM   Patient seen and examined, agree with above note.  I dictated the care and orders written for this patient under my direction.  Shock presumed to be hemorrhagic at this time , her Hg has been dropping all day as well as her platelets. Will transfuse 2 u of PRBC , IVF , repeat labs S/p intubation for hypoxemia and altered mental status. Hx of COPD, still a smoker. Not on steroids at home Acute renal failure - anuric , will follow urine output after fluid resuscitation , might require HD if she does not started making urine and acidosis improves. CC time : 45 mins D/w Vascular surgery at bedside.  Mancel Parsons, MD [phone]

## 2018-05-11 NOTE — Progress Notes (Addendum)
Patient kept  Being hypotensive despite being on Levophed. Vascular team decided to take her to OR for exploratory laparotomy. Consent obtained from sister over the phone

## 2018-05-11 NOTE — Progress Notes (Signed)
Pt's aline lost pulsatility and was bruised oozing from site. Site swollen but no palpable hematoma. Attempted reposition and flushing of line, but no pulsatility shown. Unable to draw back blood.   Aline removed at 0607. Pressure initially held for but was still oozing blood so pressure maintained for another . Pressure dressing then applied. Site level 1 (ecchymosis but no palpable hematoma). Pulses present. Informed primary RN of this event.

## 2018-05-11 NOTE — Op Note (Signed)
Date: May 15, 2018  Preoperative diagnosis: Suspected hypovolemic shock  Postoperative diagnosis: Ischemic distal small bowel, right colon, left colon, and sigmoid colon  Procedure: 1.  Exploratory laparotomy 2.  Negative pressure temporary abdominal VAC placement   Surgeon: Dr. Cephus Shelling, MD  Co-surgeon: Dr. Abigail Miyamoto, MD  Assistant: Doreatha Massed, PA  Indications: Patient is a 63 year old female that is postop day 1 status post aortobifemoral bypass for occlusive disease.  This evening she became hemodynamically unstable with profound hypotension that was initially unresponsive to volume resuscitation.  Ultimately she required intubation and pressors and had a notable drop in her hemoglobin from 11.1 to 8.6.  She was given a unit of blood for ongoing resuscitation and continued to drop her hemoglobin to 7.9 after transfusion.  She was brought to the OR emergently with suspicion for hemorrhagic shock after risks and benefits were discussed with family.  Findings: During exploratory laparotomy we found that the distal one third of her small bowel, appendix, ascending colon, descending colon, and sigmoid colon were all frankly ischemic and nonviable.  There was no evidence of retroperitoneal hematoma around the graft or other evidence of active bleeding.  The graft had an excellent pulse.  There was a palpable pulse in the SMA as well as a palpable pulse in the middle colic artery.  Ultimately general surgery was called to assist with small bowel resection as well as right colectomy and left colectomy.  Patient was left with an open abdomen and plans to return on Saturday for reexploration.  Anesthesia: General  Details: The patient was taken back to the operating room emergently after informed consent was obtained.  She was placed on operating table in supine position and her abdomen as well as her bilateral groins were prepped and draped in the standard sterile fashion.   A preop timeout was performed to identify patient, procedure and site.  Initially reopened her previous midline abdominal incision with a 15 blade scalpel.  Used Metzenbaum scissors her Vicryl sutures to close the subcutaneous tissue as well as the PDS suture to close the fascia were reopened.  Immediately upon entering the peritoneal cavity there was a foul smell.  There was no evidence of blood in the peritoneal cavity.  Ultimately we began exploring all four quadrants and running the colon and small bowel.  It was noted that the distal one third of her small bowel, her appendix, her ascending colon, as well as the descending and sigmoid colon were all frankly necrotic, ischemic, and nonviable.  At that point in time general surgery was called to assist with further exploration.  It did appear that the transverse colon as well as the proximal two thirds of her small bowel was viable.  We were able to place a Balfour retractor for better exposure and then a Omni retractor was placed with body walls to give added exposure.  The transverse colon was then folded in a moist towel cephalad.  On closer inspection we could palpate a pulse in the middle colic and we were also able to palpate a pulse in the SMA that was bounding.  At that point in time general surgery arrived and please see their dictation for small bowel resection, right colectomy, and left colectomy and sigmoid colectomy.  Once general surgery was done with the bowel resection the peritoneal cavity was copiously irrigated with saline until all the effluent was clear.  We reinspected the peritoneum and the graft appeared well covered.  Again there was  no evidence of retroperitoneal hematoma and there was an excellent pulse in both limbs of the graft.  A temporary abdominal VAC was then placed ensuring that the dressing was tucked into bilateral paracolic gutters.  A sponge was then trimmed to the appropriate size and secured and a track pad was applied  and placed to negative pressure suction.  She was transported to the ICU in critical condition.  Condition: Critical  Complications: None  Cephus Shelling, MD [facility] [place] Office: [phone] Pager: [phone]  Cephus Shelling

## 2018-05-11 NOTE — Progress Notes (Signed)
63 year old female now postop day 1 status post aortobifemoral bypass for occlusive disease.  Called earlier this afternoon for low urine output and patient was given 500 cc bolus with minimal response and initial labs were obtained that indicated creatinine is now increased to 3.07, K 5.2.  Paged again late this evening that patient had become hypotensive after sitting up to get on the bedpan.  Initially gave 1 L fluid bolus with minimal response.  Labs obtained shows hemoglobin from 11.1 this morning to 8.6 and suspect part of this is dilution in the setting of minimal urine output and at least 3 L of fluid in the last 24 hours.  She denies any worsening abdominal pain from her baseline surgical pain along midline incision.  Both of her groins are bruised but they are very soft.  She has palpable pedal pulses.  Her bicarb is now 23 and we will switch her to a bicarb drip and now oliguria.  Her gas pH was 7.2 that is all metabolic acidosis.  My initial suspicion is that this acidosis is related to her renal failure.  EKG did not reveal any significant changes and initial troponins are normal.  I will now plan to give her 1 unit of blood and recheck an H&H to see how she responds.  I have also asked critical care to assist with management at this time.  I have updated her sister.  We will continue to follow closely.  Discussed if ongoing hypotension and no response to transfusion may require exploration in the OR.  Cephus Shelling, MD [facility] Office: [phone] Pager: [phone]  Cephus Shelling

## 2018-05-11 NOTE — Consult Note (Signed)
Reason for Consult:Emergent Intraoperative Consult - Dead bowel Referring Physician: Dr. Arbie Cookey CDlark  Gwendolyn Pham is an 63 y.o. female.  HPI: Gwendolyn Pham is a 63 y.o. female who has a PMH as outlined below (see "past medical history").  She presented to [facility] 2/26 for aortofemoral bypass due to occlusive disease.  Procedure was completed without complications.  During afternoon hours of 2/27, she sat up in bed to try and use the commode and shortly thereafter, she became hypotensive.  She was given 3L IVF bolus but BP remained soft.  Hgb had dropped from 11.1 to 8.6.  Vascular surgery was called who felt that pt needed to be intubated while further workup continued.  She was intubated and brought emergently to the OR for reexploration. We are called intraoperatively for ischemic bowel/ colon.  Past Medical History  PVD, HTN, HLD, DM, depression, COPD, asthma, anxiety   Past Medical History:  Diagnosis Date  . Anxiety   . Asthma   . COPD (chronic obstructive pulmonary disease) (HCC)   . Depression   . Diabetes mellitus without complication (HCC)   . Hyperlipidemia   . Hypertension     Past Surgical History:  Procedure Laterality Date  . AORTA - BILATERAL FEMORAL ARTERY BYPASS GRAFT N/A 05/09/2018   Procedure: AORTA BIFEMORAL BYPASS GRAFT;  Surgeon: Larina Earthly, MD;  Location: [facility] OR;  Service: Vascular;  Laterality: N/A;  . DILATION AND CURETTAGE OF UTERUS    . INNER EAR SURGERY      Family History  Problem Relation Age of Onset  . Hypertension Mother   . Diabetes Mother   . COPD Father   . Heart disease Father 60       AMI x 3/CABG  . Diabetes Father   . Diabetes Sister   . Mental illness Sister        schizoaffective  . Diabetes Sister   . Arthritis Sister     Social History:  reports that she has been smoking cigarettes. She has a 25.00 pack-year smoking history. She has never used smokeless tobacco. She reports that she does not drink alcohol or use  drugs.  Allergies:  Allergies  Allergen Reactions  . Lisinopril Cough    Muscle pain   . Penicillins Rash    Did it involve swelling of the face/tongue/throat, SOB, or low BP? No Did it involve sudden or severe rash/hives, skin peeling, or any reaction on the inside of your mouth or nose? No Did you need to seek medical attention at a hospital or doctor's office? No When did it last happen?Longer than 10 years ago If all above answers are "NO", may proceed with cephalosporin use.     Medications:  Prior to Admission medications   Medication Sig Start Date End Date Taking? Authorizing Provider  acetaminophen (TYLENOL) 325 MG tablet Take 650 mg by mouth every 6 (six) hours as needed for moderate pain.    Yes [provider]  aspirin EC 81 MG tablet Take 1 tablet (81 mg total) by mouth daily. 04/20/18  Yes Patwardhan, Manish J, MD  atorvastatin (LIPITOR) 80 MG tablet Take 1 tablet (80 mg total) by mouth daily. 04/14/18  Yes Myles Lipps, MD  buPROPion [facility] SR) 150 MG 12 hr tablet Take 1 tablet (150 mg total) by mouth 2 (two) times daily. 01/23/18  Yes Myles Lipps, MD  Fluticasone-Salmeterol (ADVAIR) 100-50 MCG/DOSE AEPB Inhale 1 puff into the lungs 2 (two) times daily.  Yes [provider]  hydrochlorothiazide (HYDRODIURIL) 25 MG tablet Take 1 tablet (25 mg total) by mouth daily. 01/23/18  Yes Myles Lipps, MD  metFORMIN (GLUCOPHAGE) 500 MG tablet Take 1 tablet (500 mg total) by mouth daily with breakfast. 01/23/18  Yes Myles Lipps, MD  metoprolol tartrate (LOPRESSOR) 25 MG tablet Take 1 tablet (25 mg total) by mouth 2 (two) times daily. 01/23/18  Yes Myles Lipps, MD  venlafaxine XR (EFFEXOR-XR) 150 MG 24 hr capsule TAKE 1 CAPSULE BY MOUTH EVERY DAY WITH BREAKFAST Patient taking differently: Take 150 mg by mouth daily with breakfast.  02/10/18  Yes Myles Lipps, MD     Results for orders placed or performed during the hospital  encounter of 05/18/2018 (from the past 48 hour(s))  Glucose, capillary     Status: Abnormal   Collection Time: 06/03/2018  5:39 AM  Result Value Ref Range   Glucose-Capillary 134 (H) 70 - 99 mg/dL  I-STAT 7, (LYTES, BLD GAS, ICA, H+H)     Status: Abnormal   Collection Time: Jun 04, 2018  8:55 AM  Result Value Ref Range   pH, Arterial 7.412 7.350 - 7.450   pCO2 arterial 38.5 32.0 - 48.0 mmHg   pO2, Arterial 129.0 (H) 83.0 - 108.0 mmHg   Bicarbonate 24.6 20.0 - 28.0 mmol/L   TCO2 26 22 - 32 mmol/L   O2 Saturation 99.0 %   Sodium 137 135 - 145 mmol/L   Potassium 3.6 3.5 - 5.1 mmol/L   Calcium, Ion 1.21 1.15 - 1.40 mmol/L   HCT 36.0 36.0 - 46.0 %   Hemoglobin 12.2 12.0 - 15.0 g/dL   Patient temperature HIDE    Sample type ARTERIAL   I-STAT 7, (LYTES, BLD GAS, ICA, H+H)     Status: Abnormal   Collection Time: 05/27/2018 10:39 AM  Result Value Ref Range   pH, Arterial 7.388 7.350 - 7.450   pCO2 arterial 38.8 32.0 - 48.0 mmHg   pO2, Arterial 338.0 (H) 83.0 - 108.0 mmHg   Bicarbonate 23.4 20.0 - 28.0 mmol/L   TCO2 25 22 - 32 mmol/L   O2 Saturation 100.0 %   Acid-base deficit 1.0 0.0 - 2.0 mmol/L   Sodium 135 135 - 145 mmol/L   Potassium 3.7 3.5 - 5.1 mmol/L   Calcium, Ion 1.12 (L) 1.15 - 1.40 mmol/L   HCT 32.0 (L) 36.0 - 46.0 %   Hemoglobin 10.9 (L) 12.0 - 15.0 g/dL   Patient temperature HIDE    Sample type ARTERIAL   I-STAT 4, (NA,K, GLUC, HGB,HCT)     Status: Abnormal   Collection Time: Jun 04, 2018 10:44 AM  Result Value Ref Range   Sodium 135 135 - 145 mmol/L   Potassium 3.7 3.5 - 5.1 mmol/L   Glucose, Bld 137 (H) 70 - 99 mg/dL   HCT 29.5 (L) 28.4 - 13.2 %   Hemoglobin 10.9 (L) 12.0 - 15.0 g/dL  CBC     Status: Abnormal   Collection Time: 06/12/2018 12:07 PM  Result Value Ref Range   WBC 17.0 (H) 4.0 - 10.5 K/uL   RBC 3.65 (L) 3.87 - 5.11 MIL/uL   Hemoglobin 11.3 (L) 12.0 - 15.0 g/dL   HCT 44.0 (L) 10.2 - 72.5 %   MCV 97.5 80.0 - 100.0 fL   MCH 31.0 26.0 - 34.0 pg   MCHC 31.7 30.0  - 36.0 g/dL   RDW 36.6 44.0 - 34.7 %   Platelets 158 150 - 400 K/uL  nRBC 0.0 0.0 - 0.2 %    Comment: Performed at [facility], [address], [place], [place] [address]  Basic metabolic panel     Status: Abnormal   Collection Time: 05/09/2018 12:07 PM  Result Value Ref Range   Sodium 135 135 - 145 mmol/L   Potassium 4.0 3.5 - 5.1 mmol/L   Chloride 104 98 - 111 mmol/L   CO2 22 22 - 32 mmol/L   Glucose, Bld 177 (H) 70 - 99 mg/dL   BUN 13 8 - 23 mg/dL   Creatinine, Ser 4.69 (H) 0.44 - 1.00 mg/dL   Calcium 7.9 (L) 8.9 - 10.3 mg/dL   GFR calc non Af Amer 53 (L) >60 mL/min   GFR calc Af Amer >60 >60 mL/min   Anion gap 9 5 - 15    Comment: Performed at [facility], [address], [place], [place] [address]  Protime-INR     Status: None   Collection Time: 05/05/2018 12:07 PM  Result Value Ref Range   Prothrombin Time 14.4 11.4 - 15.2 seconds   INR 1.1 0.8 - 1.2    Comment: (NOTE) INR goal varies based on device and disease states. Performed at [facility], [address], [place], [place] [address]   APTT     Status: None   Collection Time: 04/16/2018 12:07 PM  Result Value Ref Range   aPTT 34 24 - 36 seconds    Comment: Performed at [facility], [address], [place], [place] [address]  Magnesium     Status: None   Collection Time: 05/09/2018 12:07 PM  Result Value Ref Range   Magnesium 1.9 1.7 - 2.4 mg/dL    Comment: Performed at [facility], [address], [place], [place] [address]  Blood gas, arterial     Status: Abnormal   Collection Time: 05/06/2018 12:20 PM  Result Value Ref Range   pH, Arterial 7.239 (L) 7.350 - 7.450   pCO2 arterial 51.0 (H) 32.0 - 48.0 mmHg   pO2, Arterial 130 (H) 83.0 - 108.0 mmHg   Bicarbonate 21.1 20.0 - 28.0 mmol/L   Acid-base deficit 5.2 (H) 0.0 - 2.0 mmol/L   O2 Saturation 97.4 %   Patient temperature 98.6    Collection site A-LINE    Sample type ARTERIAL DRAW   Glucose, capillary     Status: Abnormal    Collection Time: 04/23/2018 12:33 PM  Result Value Ref Range   Glucose-Capillary 193 (H) 70 - 99 mg/dL  Glucose, capillary     Status: Abnormal   Collection Time: 04/28/2018  3:38 PM  Result Value Ref Range   Glucose-Capillary 225 (H) 70 - 99 mg/dL  Glucose, capillary     Status: Abnormal   Collection Time: 05/01/2018  9:35 PM  Result Value Ref Range   Glucose-Capillary 309 (H) 70 - 99 mg/dL  I-STAT 7, (LYTES, BLD GAS, ICA, H+H)     Status: Abnormal   Collection Time: 04/29/2018 12:57 AM  Result Value Ref Range   pH, Arterial 7.382 7.350 - 7.450   pCO2 arterial 31.7 (L) 32.0 - 48.0 mmHg   pO2, Arterial 74.0 (L) 83.0 - 108.0 mmHg   Bicarbonate 19.0 (L) 20.0 - 28.0 mmol/L   TCO2 20 (L) 22 - 32 mmol/L   O2 Saturation 95.0 %   Acid-base deficit 5.0 (H) 0.0 - 2.0 mmol/L   Sodium 134 (L) 135 - 145 mmol/L   Potassium  4.6 3.5 - 5.1 mmol/L   Calcium, Ion 1.05 (L) 1.15 - 1.40 mmol/L   HCT 35.0 (L) 36.0 - 46.0 %   Hemoglobin 11.9 (L) 12.0 - 15.0 g/dL   Patient temperature 40.9 C    Sample type ARTERIAL   CBC     Status: Abnormal   Collection Time: 05/25/18  5:08 AM  Result Value Ref Range   WBC 13.9 (H) 4.0 - 10.5 K/uL   RBC 3.61 (L) 3.87 - 5.11 MIL/uL   Hemoglobin 11.1 (L) 12.0 - 15.0 g/dL   HCT 81.1 (L) 91.4 - 78.2 %   MCV 96.1 80.0 - 100.0 fL   MCH 30.7 26.0 - 34.0 pg   MCHC 32.0 30.0 - 36.0 g/dL   RDW 95.6 21.3 - 08.6 %   Platelets 117 (L) 150 - 400 K/uL    Comment: REPEATED TO VERIFY PLATELET COUNT CONFIRMED BY SMEAR SPECIMEN CHECKED FOR CLOTS Immature Platelet Fraction may be clinically indicated, consider ordering this additional test [id number]    nRBC 0.0 0.0 - 0.2 %    Comment: Performed at [facility], [address] [place], [place] [address]  Comprehensive metabolic panel     Status: Abnormal   Collection Time: 25-May-2018  5:08 AM  Result Value Ref Range   Sodium 130 (L) 135 - 145 mmol/L   Potassium 5.5 (H) 3.5 - 5.1 mmol/L   Chloride 99 98 - 111 mmol/L   CO2 19  (L) 22 - 32 mmol/L   Glucose, Bld 433 (H) 70 - 99 mg/dL   BUN 25 (H) 8 - 23 mg/dL   Creatinine, Ser 1.32 (H) 0.44 - 1.00 mg/dL    Comment: DELTA CHECK NOTED   Calcium 7.7 (L) 8.9 - 10.3 mg/dL   Total Protein 5.7 (L) 6.5 - 8.1 g/dL   Albumin 3.2 (L) 3.5 - 5.0 g/dL   AST 91 (H) 15 - 41 U/L   ALT 60 (H) 0 - 44 U/L   Alkaline Phosphatase 49 38 - 126 U/L   Total Bilirubin 0.6 0.3 - 1.2 mg/dL   GFR calc non Af Amer 21 (L) >60 mL/min   GFR calc Af Amer 24 (L) >60 mL/min   Anion gap 12 5 - 15    Comment: Performed at [facility], [address] [place], [place] [address]  Magnesium     Status: Abnormal   Collection Time: 05/25/18  5:08 AM  Result Value Ref Range   Magnesium 1.6 (L) 1.7 - 2.4 mg/dL    Comment: Performed at [facility], [address] [place], [place] [address]  Amylase     Status: Abnormal   Collection Time: 05/25/2018  5:08 AM  Result Value Ref Range   Amylase 166 (H) 28 - 100 U/L    Comment: Performed at [facility], [address] [place], [place] [address]  Glucose, capillary     Status: Abnormal   Collection Time: May 25, 2018  7:06 AM  Result Value Ref Range   Glucose-Capillary 234 (H) 70 - 99 mg/dL  Glucose, capillary     Status: Abnormal   Collection Time: 05/25/18 10:37 AM  Result Value Ref Range   Glucose-Capillary 159 (H) 70 - 99 mg/dL  Glucose, capillary     Status: Abnormal   Collection Time: 05-25-2018 12:12 PM  Result Value Ref Range   Glucose-Capillary 119 (H) 70 - 99 mg/dL  Glucose, capillary     Status: Abnormal  Collection Time: 04/17/2018  4:05 PM  Result Value Ref Range   Glucose-Capillary 102 (H) 70 - 99 mg/dL  Comprehensive metabolic panel     Status: Abnormal   Collection Time: 04/16/2018  4:13 PM  Result Value Ref Range   Sodium 132 (L) 135 - 145 mmol/L   Potassium 5.2 (H) 3.5 - 5.1 mmol/L   Chloride 103 98 - 111 mmol/L   CO2 18 (L) 22 - 32 mmol/L   Glucose, Bld 121 (H) 70 - 99 mg/dL   BUN 37 (H) 8 - 23 mg/dL    Creatinine, Ser 6.65 (H) 0.44 - 1.00 mg/dL   Calcium 7.6 (L) 8.9 - 10.3 mg/dL   Total Protein 4.8 (L) 6.5 - 8.1 g/dL   Albumin 2.7 (L) 3.5 - 5.0 g/dL   AST 993 (H) 15 - 41 U/L   ALT 97 (H) 0 - 44 U/L   Alkaline Phosphatase 45 38 - 126 U/L   Total Bilirubin 0.7 0.3 - 1.2 mg/dL   GFR calc non Af Amer 15 (L) >60 mL/min   GFR calc Af Amer 18 (L) >60 mL/min   Anion gap 11 5 - 15    Comment: Performed at [facility], [address] [place], [place] [address]  CBC with Differential/Platelet     Status: Abnormal   Collection Time: 04/29/2018  6:02 PM  Result Value Ref Range   WBC 10.1 4.0 - 10.5 K/uL   RBC 2.78 (L) 3.87 - 5.11 MIL/uL   Hemoglobin 8.6 (L) 12.0 - 15.0 g/dL    Comment: REPEATED TO VERIFY   HCT 27.3 (L) 36.0 - 46.0 %   MCV 98.2 80.0 - 100.0 fL   MCH 30.9 26.0 - 34.0 pg   MCHC 31.5 30.0 - 36.0 g/dL   RDW 79.3 90.3 - 00.9 %   Platelets 71 (L) 150 - 400 K/uL    Comment: Immature Platelet Fraction may be clinically indicated, consider ordering this additional test [id number]    nRBC 0.0 0.0 - 0.2 %   Neutrophils Relative % 86 %   Neutro Abs 8.8 (H) 1.7 - 7.7 K/uL   Lymphocytes Relative 11 %   Lymphs Abs 1.1 0.7 - 4.0 K/uL   Monocytes Relative 3 %   Monocytes Absolute 0.3 0.1 - 1.0 K/uL   Eosinophils Relative 0 %   Eosinophils Absolute 0.0 0.0 - 0.5 K/uL   Basophils Relative 0 %   Basophils Absolute 0.0 0.0 - 0.1 K/uL   WBC Morphology INCREASED BANDS (>20% BANDS)     Comment: MILD LEFT SHIFT (1-5% METAS, OCC MYELO, OCC BANDS) VACUOLATED NEUTROPHILS    Immature Granulocytes 0 %   Abs Immature Granulocytes 0.03 0.00 - 0.07 K/uL    Comment: Performed at [facility], [address], [place], [place] [address]  Troponin I - Once     Status: None   Collection Time: 04/23/2018  6:10 PM  Result Value Ref Range   Troponin I <0.03 <0.03 ng/mL    Comment: Performed at [facility], [address], [place], [place] [address]  I-STAT 4, (NA,K, GLUC, HGB,HCT)      Status: Abnormal   Collection Time: 04/21/2018  6:14 PM  Result Value Ref Range   Sodium 134 (L) 135 - 145 mmol/L   Potassium 5.2 (H) 3.5 - 5.1 mmol/L   Glucose, Bld 111 (H) 70 - 99 mg/dL   HCT 56.3 (L) 89.3 - 73.4 %   Hemoglobin 8.2 (  L) 12.0 - 15.0 g/dL  I-STAT 7, (LYTES, BLD GAS, ICA, H+H)     Status: Abnormal   Collection Time: 04/19/2018  6:36 PM  Result Value Ref Range   pH, Arterial 7.261 (L) 7.350 - 7.450   pCO2 arterial 35.7 32.0 - 48.0 mmHg   pO2, Arterial 70.0 (L) 83.0 - 108.0 mmHg   Bicarbonate 16.1 (L) 20.0 - 28.0 mmol/L   TCO2 17 (L) 22 - 32 mmol/L   O2 Saturation 91.0 %   Acid-base deficit 10.0 (H) 0.0 - 2.0 mmol/L   Sodium 133 (L) 135 - 145 mmol/L   Potassium 5.2 (H) 3.5 - 5.1 mmol/L   Calcium, Ion 1.12 (L) 1.15 - 1.40 mmol/L   HCT 22.0 (L) 36.0 - 46.0 %   Hemoglobin 7.5 (L) 12.0 - 15.0 g/dL   Patient temperature HIDE    Sample type ARTERIAL   Prepare RBC     Status: None   Collection Time: 04/17/2018  7:36 PM  Result Value Ref Range   Order Confirmation      ORDER PROCESSED BY BLOOD BANK Performed at [facility], [address], [place], [place] [address]   CBC     Status: Abnormal   Collection Time: 05/02/2018  7:55 PM  Result Value Ref Range   WBC 9.0 4.0 - 10.5 K/uL   RBC 2.53 (L) 3.87 - 5.11 MIL/uL   Hemoglobin 7.9 (L) 12.0 - 15.0 g/dL   HCT 96.7 (L) 89.3 - 81.0 %   MCV 96.0 80.0 - 100.0 fL   MCH 31.2 26.0 - 34.0 pg   MCHC 32.5 30.0 - 36.0 g/dL   RDW 17.5 10.2 - 58.5 %   Platelets 57 (L) 150 - 400 K/uL    Comment: Immature Platelet Fraction may be clinically indicated, consider ordering this additional test [id number] CONSISTENT WITH PREVIOUS RESULT    nRBC 0.0 0.0 - 0.2 %    Comment: Performed at [facility], [address] [place], [place] [address]  Comprehensive metabolic panel     Status: Abnormal   Collection Time: 04/19/2018  7:55 PM  Result Value Ref Range   Sodium 139 135 - 145 mmol/L   Potassium 5.4 (H) 3.5 - 5.1 mmol/L    Chloride 108 98 - 111 mmol/L   CO2 20 (L) 22 - 32 mmol/L   Glucose, Bld 83 70 - 99 mg/dL   BUN 38 (H) 8 - 23 mg/dL   Creatinine, Ser 4.31 (H) 0.44 - 1.00 mg/dL   Calcium 6.5 (L) 8.9 - 10.3 mg/dL   Total Protein 3.5 (L) 6.5 - 8.1 g/dL   Albumin 1.9 (L) 3.5 - 5.0 g/dL   AST 540 (H) 15 - 41 U/L   ALT 281 (H) 0 - 44 U/L   Alkaline Phosphatase 32 (L) 38 - 126 U/L   Total Bilirubin 0.6 0.3 - 1.2 mg/dL   GFR calc non Af Amer 15 (L) >60 mL/min   GFR calc Af Amer 18 (L) >60 mL/min   Anion gap 11 5 - 15    Comment: Performed at [facility], [address], [place], [place] [address]  Magnesium     Status: None   Collection Time: 04/20/2018  7:55 PM  Result Value Ref Range   Magnesium 2.1 1.7 - 2.4 mg/dL    Comment: Performed at [facility], [address] [place], [place] [address]  Lactic acid, plasma     Status: Abnormal  Collection Time: 04/16/2018  7:56 PM  Result Value Ref Range   Lactic Acid, Venous 3.7 (HH) 0.5 - 1.9 mmol/L    Comment: CRITICAL RESULT CALLED TO, READ BACK BY AND VERIFIED WITH: Milon Dikes A,RN 04/15/2018 2045 WAYK Performed at [facility], [address] [place], [place] [address]   DIC (disseminated intravasc coag) panel     Status: Abnormal   Collection Time: 05/12/2018  7:59 PM  Result Value Ref Range   Prothrombin Time 17.9 (H) 11.4 - 15.2 seconds   INR 1.5 (H) 0.8 - 1.2    Comment: (NOTE) INR goal varies based on device and disease states.    aPTT 38 (H) 24 - 36 seconds    Comment:        IF BASELINE aPTT IS ELEVATED, SUGGEST PATIENT RISK ASSESSMENT BE USED TO DETERMINE APPROPRIATE ANTICOAGULANT THERAPY.    Fibrinogen 313 210 - 475 mg/dL   D-Dimer, Quant 60.45 (H) 0.00 - 0.50 ug/mL-FEU    Comment: (NOTE) At the manufacturer cut-off of 0.50 ug/mL FEU, this assay has been documented to exclude PE with a sensitivity and negative predictive value of 97 to 99%.  At this time, this assay has not been approved by the FDA to exclude  DVT/VTE. Results should be correlated with clinical presentation.    Platelets 58 (L) 150 - 400 K/uL    Comment: Immature Platelet Fraction may be clinically indicated, consider ordering this additional test [id number] CONSISTENT WITH PREVIOUS RESULT    Smear Review NO SCHISTOCYTES SEEN     Comment: Performed at [facility], [address], [place], [place] [address]  Troponin I -     Status: None   Collection Time: 04/21/2018  8:00 PM  Result Value Ref Range   Troponin I <0.03 <0.03 ng/mL    Comment: Performed at [facility], [address], [place], [place] [address]  Prepare fresh frozen plasma     Status: None (Preliminary result)   Collection Time: 05/09/2018  9:24 PM  Result Value Ref Range   Unit Number [id number]    Blood Component Type THAWED PLASMA    Unit division 00    Status of Unit ISSUED    Transfusion Status      OK TO TRANSFUSE Performed at [facility], [address] [place], [place] [address]    Unit Number [id number]    Blood Component Type THAWED PLASMA    Unit division 00    Status of Unit ISSUED    Transfusion Status OK TO TRANSFUSE   Prepare Pheresed Platelets     Status: None (Preliminary result)   Collection Time: 04/20/2018  9:28 PM  Result Value Ref Range   Unit Number [id number]    Blood Component Type PLTPHER LR2    Unit division 00    Status of Unit ISSUED    Transfusion Status      OK TO TRANSFUSE Performed at [facility], [address], [place], [place] [address]     Dg Chest Port 1 View  Result Date: 04/15/2018 CLINICAL DATA:  Postop vascular surgery.  Intubated. EXAM: PORTABLE CHEST 1 VIEW COMPARISON:  None. FINDINGS: Endotracheal tube tip is 1 cm above the carina. NG tube coils in the stomach. Bilateral pleural effusions with bibasilar atelectasis. Diffuse interstitial prominence throughout the lungs vascular congestion. Findings concerning for early interstitial edema. IMPRESSION: Endotracheal tube 1  cm above the carina. Layering bilateral effusions with bibasilar atelectasis. Diffuse  interstitial prominence, likely interstitial edema. Electronically Signed   By: Charlett NoseKevin  Dover M.D.   On: 2018/06/07 20:11   Dg Chest Port 1 View  Result Date: Jul 29, 2018 CLINICAL DATA:  63 year old female with history of shortness of breath EXAM: PORTABLE CHEST 1 VIEW COMPARISON:  2018/06/07, 05/12/2018 FINDINGS: Cardiomediastinal silhouette unchanged in size and contour. Low lung volumes. Minimal interlobular septal thickening. No pleural effusion or pneumothorax. Right IJ sheath remains with interval removal of the Swan-Ganz catheter. No confluent airspace disease. IMPRESSION: Low lung volumes with either minimal edema or atelectasis. Interval removal of the Swan-Ganz catheter with persisting right IJ sheath. Electronically Signed   By: Gilmer MorJaime  Wagner D.O.   On: 2018/06/07 09:42   Dg Chest Port 1 View  Result Date: Jul 29, 2018 CLINICAL DATA:  63 year old female with Swan-Ganz placement. EXAM: PORTABLE CHEST 1 VIEW COMPARISON:  Chest radiograph dated 04/26/2018 FINDINGS: There has been interval retraction of the Swan-Ganz with tip likely in the main pulmonary trunk. Minimal bibasilar atelectasis. No focal consolidation, pleural effusion, or pneumothorax. The cardiac silhouette is within normal limits. No acute osseous pathology. IMPRESSION: Interval retraction of the Swan-Ganz with tip likely in the main pulmonary trunk. Electronically Signed   By: Elgie CollardArash  Radparvar M.D.   On: 2018/06/07 02:14   Dg Chest Port 1 View  Result Date: 04/21/2018 CLINICAL DATA:  Initial postoperative evaluation status posts aortobifemoral graft placement. EXAM: PORTABLE CHEST 1 VIEW COMPARISON:  Prior CT from 04/12/2018 FINDINGS: Transverse heart size within normal limits. Mediastinal silhouette normal. Right IJ approach Swan-Ganz catheter in place with tip overlying the main pulmonary outflow tract. Lungs mildly hypoinflated. Mild to moderate  diffuse pulmonary interstitial edema. Superimposed mild bibasilar atelectasis. No other consolidative opacity. Probable small left pleural effusion. No pneumothorax. No acute osseous finding. IMPRESSION: 1. Right IJ approach Swan-Ganz catheter in place with tip overlying the main pulmonary outflow tract. 2. Mild to moderate diffuse pulmonary interstitial edema with probable small left pleural effusion. 3. Superimposed mild bibasilar atelectasis. Electronically Signed   By: Rise MuBenjamin  McClintock M.D.   On: 04/15/2018 15:41   Dg Abd Portable 1v  Result Date: 04/20/2018 CLINICAL DATA:  Initial postoperative evaluation status post aortobifemoral graft placement. EXAM: PORTABLE ABDOMEN - 1 VIEW COMPARISON:  Prior CT from 04/12/2018 FINDINGS: Surgical clips overlie the mid abdomen, consistent with interval aortobifemoral graft. Graft itself not visualized. Bowel gas pattern is nonobstructive. Large volume retained colonic stool noted. No visible free air on this single supine view of the abdomen. Punctate calcific densities overlying the left renal shadow could reflect nephrolithiasis or possibly be vascular in nature. Prominent atherosclerotic change noted within the iliac arteries bilaterally. Levoscoliosis with mild multilevel degenerative spondylolysis. IMPRESSION: 1. Surgical clips overlying the mid abdomen, consistent with provided history of interval aortobifemoral graft. No appreciable adverse features. 2. Large volume retained colonic stool, suggesting constipation. 3. Calcific densities overlying the left renal shadow which could reflect nephrolithiasis or possibly vascular calcifications. Electronically Signed   By: Rise MuBenjamin  McClintock M.D.   On: 04/26/2018 15:45    ROS  Unable to obtain - patient intubated and in the OR.  Blood pressure 116/75, pulse (!) 118, temperature (!) 94.4 F (34.7 C), temperature source Axillary, resp. rate 20, height 5\' 4"  (1.626 m), weight 61.7 kg, SpO2 90 %. Physical  Exam See operative note  Assessment/Plan: Ischemic small bowel and colon after aortobifem.  Exploratory laparotomy/ bowel resection. Likely will leave abdomen open - will need second look laparotomy.    Wilmon ArmsMatthew K Cristoval Teall Jul 29, 2018, 10:36  PM

## 2018-05-11 NOTE — Progress Notes (Addendum)
[facility]  Subjective  - Oriented x 3 this am, continues to be anxious.    Objective (!) 153/80 (!) 115 (!) 97.2 F (36.2 C) (Axillary) (!) 37 94%  Intake/Output Summary (Last 24 hours) at 05/01/2018 0814 Last data filed at 04/15/2018 0400 Gross per 24 hour  Intake 4678.29 ml  Output 2525 ml  Net 2153.29 ml    Abdominal incision healing well, minimal drainage distal incision, groins soft with distal palpable pulses.   Heart Tachy in the 115-120 bpm, BP systolic 150's to 160's.   Lungs O2 support [place] Sat 98%   Assessment/Planning: POD # 1 Aortobifemoral bypass with 14 x 8 Hemashield graft  She will be given sips of water and ice chips as tolerates today.  I have restarted her home medications to include Effexor and wellbutrin that may help her anxiety.   Mobility with supervision encouraged.   UO 310 cc last 12 hours.  Will change IV fluids to NS @ 125 cc  As well as continued HCTZ. .  Elevated Cr 2.4 pre-op 0.75.    Mosetta Pigeon 05/07/2018 8:14 AM --  Laboratory Lab Results: Recent Labs    04/22/2018 1207 05/10/2018 0057 05/12/2018 0508  WBC 17.0*  --  13.9*  HGB 11.3* 11.9* 11.1*  HCT 35.6* 35.0* 34.7*  PLT 158  --  117*   BMET Recent Labs    04/18/2018 1207 05/12/2018 0057 05/04/2018 0508  NA 135 134* 130*  K 4.0 4.6 5.5*  CL 104  --  99  CO2 22  --  19*  GLUCOSE 177*  --  433*  BUN 13  --  25*  CREATININE 1.11*  --  2.40*  CALCIUM 7.9*  --  7.7*    COAG Lab Results  Component Value Date   INR 1.1 04/21/2018   INR 0.86 05/02/2018   No results found for: PTT  I have examined the patient, reviewed and agree with above.  Easily palpable dorsalis pedis pulses bilaterally.  Pain control remains an issue.  Very anxious.  Will begin home meds.  Creatinine up to 2.5 but 350 cc urine output over the last 12 hours.  Continue to monitor.  IVs changed to normal saline.  Gretta Began, MD 05/12/2018 9:45 AM

## 2018-05-11 NOTE — Progress Notes (Signed)
RN paged attending MD and PA. Also paged on-call MD - made aware of patient not having adequate urine output (16 mL over 4 hours). Bladder scanned with minimal residual. Prompted movement of patient to address possible malpositioning of foley. Vital signs stable otherwise.   Orders for 500 mL Normal Saline bolus given.   RN will continue to monitor patient.  Joycie Peek, RN 05/04/2018.1400hr

## 2018-05-11 NOTE — Procedures (Signed)
Intubation Procedure Note Gwendolyn Pham [id number] [birth year]  Procedure: Intubation Indications: Respiratory insufficiency  Procedure Details Consent: emergent procedure Time Out: Verified patient identification, verified procedure, site/side was marked, verified correct patient position, special equipment/implants available, medications/allergies/relevent history reviewed, required imaging and test results available.  Performed  Drugs:   4 mg Versed, 20 mg Etomidate, 50 mg Rocuronium. Glidoscope with # 4 blade. Grade 1 view. 7.5 tube visualized passing through vocal cords. Following intubation:  positive color change on ETCO2, condensation seen in endotracheal tube, equal breath sounds bilaterally.  Evaluation Hemodynamic Status: BP stable throughout; O2 sats: stable throughout Patient's Current Condition: unstable Complications: No apparent complications Patient did tolerate procedure well. Chest X-ray ordered to verify placement.  CXR: ett is low and will be pulled out 1 cm.    Gwendolyn Pham R. Hanley Hays MD [facility] 2018/05/27, 8:05 PM

## 2018-05-11 NOTE — Evaluation (Signed)
Occupational Therapy Evaluation Patient Details Name: Gwendolyn Pham MRN: [id number] DOB: Feb 27, 1956 Today's Date: May 28, 2018    History of Present Illness Pt is a 63 year old woman admitted 05/03/2018 with aortic occlusion, now s/p aortobifemoral BPG. PMH: smoker, anxiety, depression, DM, asthma, HTN.   Clinical Impression   Pt lives alone and functions independently at baseline. She presents with decreased safety awareness and was highly distracted by abdominal pain this visit. Pt educated in log roll technique for bed mobility and use of pillow to minimize abdominal pain. She performed transfer to chair with min assist, second person for safety and lines. Pt requires up to max assist for ADL. She reports she will have assistance of her sister when she returns home, recommending home with 24 hour care initially. Will follow acutely.     Follow Up Recommendations  Home health OT;Supervision/Assistance - 24 hour    Equipment Recommendations  3 in 1 bedside commode    Recommendations for Other Services       Precautions / Restrictions Precautions Precautions: Fall Precaution Comments: educated in how to splint abdoment with pillow Restrictions Weight Bearing Restrictions: No      Mobility Bed Mobility Overal bed mobility: Needs Assistance Bed Mobility: Rolling;Sidelying to Sit Rolling: Min assist Sidelying to sit: Min assist       General bed mobility comments: educated in log roll technique while splinting abdomen with pillow to minimize abdominal pain  Transfers Overall transfer level: Needs assistance Equipment used: Rolling walker (2 wheeled) Transfers: Sit to/from [employer] Sit to Stand: Min assist;+2 safety/equipment Stand pivot transfers: +2 safety/equipment;Min assist       General transfer comment: assist for stability, cues for technique, increased time    Balance Overall balance assessment: Needs assistance   Sitting balance-Leahy Scale:  Fair       Standing balance-Leahy Scale: Poor                             ADL either performed or assessed with clinical judgement   ADL Overall ADL's : Needs assistance/impaired Eating/Feeding: Set up;Sitting Eating/Feeding Details (indicate cue type and reason): ice chips Grooming: Minimal assistance;Sitting   Upper Body Bathing: Sitting;Maximal assistance   Lower Body Bathing: Maximal assistance;Sit to/from stand   Upper Body Dressing : Maximal assistance;Sitting   Lower Body Dressing: Maximal assistance;Sit to/from stand   Toilet Transfer: Minimal assistance;+2 for safety/equipment;Stand-pivot;RW   Toileting- Clothing Manipulation and Hygiene: Maximal assistance;Sit to/from stand         General ADL Comments: pain limiting     Vision Patient Visual Report: No change from baseline       Perception     Praxis      Pertinent Vitals/Pain Pain Assessment: Faces Faces Pain Scale: Hurts whole lot Pain Location: abdomen Pain Descriptors / Indicators: Aching;Grimacing;Guarding Pain Intervention(s): Monitored during session;Repositioned;Premedicated before session     Hand Dominance Right   Extremity/Trunk Assessment Upper Extremity Assessment Upper Extremity Assessment: Overall WFL for tasks assessed   Lower Extremity Assessment Lower Extremity Assessment: Defer to PT evaluation   Cervical / Trunk Assessment Cervical / Trunk Assessment: Normal   Communication Communication Communication: No difficulties   Cognition Arousal/Alertness: Awake/alert Behavior During Therapy: Anxious;Flat affect;Impulsive;Restless Overall Cognitive Status: Impaired/Different from baseline Area of Impairment: Safety/judgement;Following commands;Problem solving;Attention                   Current Attention Level: Sustained   Following Commands: Follows  one step commands with increased time Safety/Judgement: Decreased awareness of safety;Decreased awareness  of deficits   Problem Solving: Slow processing;Decreased initiation;Difficulty sequencing;Requires verbal cues;Requires tactile cues General Comments: pt pulling at lines, attempting to stand up once seated in chair   General Comments       Exercises     Shoulder Instructions      Home Living Family/patient expects to be discharged to:: Private residence Living Arrangements: Alone Available Help at Discharge: Family;Available 24 hours/day(sister) Type of Home: House Home Access: Stairs to enter [employer] of Steps: 5 Entrance Stairs-Rails: Right Home Layout: One level     Bathroom Shower/Tub: Chief Strategy Officer: Standard     Home Equipment: None   Additional Comments: lives with her dog and cat      Prior Functioning/Environment Level of Independence: Independent                 OT Problem List: Decreased activity tolerance;Impaired balance (sitting and/or standing);Pain;Decreased knowledge of use of DME or AE;Decreased cognition;Decreased safety awareness      OT Treatment/Interventions: Self-care/ADL training;DME and/or AE instruction;Cognitive remediation/compensation;Patient/family education;Balance training    OT Goals(Current goals can be found in the care plan section) Acute Rehab OT Goals Patient Stated Goal: to return home to her pets, pain relief OT Goal Formulation: With patient Time For Goal Achievement: 05/25/18 Potential to Achieve Goals: Good ADL Goals Pt Will Perform Grooming: with supervision;standing Pt Will Perform Upper Body Bathing: with set-up;sitting Pt Will Perform Lower Body Bathing: with supervision;with adaptive equipment;sit to/from stand Pt Will Perform Upper Body Dressing: with set-up;with supervision;sitting Pt Will Perform Lower Body Dressing: with min assist;sit to/from stand Pt Will Transfer to Toilet: with supervision;ambulating;bedside commode Pt Will Perform Toileting - Clothing Manipulation  and hygiene: with supervision;sit to/from stand Pt Will Perform Tub/Shower Transfer: Tub transfer;with supervision;ambulating  OT Frequency: Min 2X/week   Barriers to D/C:            Co-evaluation PT/OT/SLP Co-Evaluation/Treatment: Yes Reason for Co-Treatment: For patient/therapist safety;Necessary to address cognition/behavior during functional activity   OT goals addressed during session: ADL's and self-care      AM-PAC OT "6 Clicks" Daily Activity     Outcome Measure Help from another person eating meals?: None Help from another person taking care of personal grooming?: A Little Help from another person toileting, which includes using toliet, bedpan, or urinal?: A Lot Help from another person bathing (including washing, rinsing, drying)?: A Lot Help from another person to put on and taking off regular upper body clothing?: A Lot Help from another person to put on and taking off regular lower body clothing?: A Lot 6 Click Score: 15   End of Session Equipment Utilized During Treatment: Rolling walker;Oxygen Nurse Communication: Mobility status  Activity Tolerance: Patient limited by pain Patient left: in chair;with call bell/phone within reach;with chair alarm set  OT Visit Diagnosis: Unsteadiness on feet (R26.81);Other abnormalities of gait and mobility (R26.89);Pain;Other symptoms and signs involving cognitive function                Time: 3567-0141 OT Time Calculation (min): 25 min Charges:  OT General Charges $OT Visit: 1 Visit OT Evaluation $OT Eval Moderate Complexity: 1 Mod  Gwendolyn Pham, OTR/L Acute Rehabilitation Services Pager: [phone] Office: [phone]  Evern Bio 05/02/2018, 9:46 AM

## 2018-05-11 NOTE — Progress Notes (Addendum)
Patient was sat straight up in bed to use bedpan.   After about 15-20 minutes, another RN and I went to clean her up.  Her BP was noted to be 52/41 (47).  Recycled BP 30/22 (26).  Patient was very glazy and pale, but she was A&Ox4. Quickly we placed her in reverse trendelenburg position with minimal improvement 72/48 (56). RR 40s. HR 100s.   **Of note, patient's RR had been in the 40s all day. Surgeon aware on morning rounds. Patient had been anxious over night and all day.  Haldol/Dilaudid given to really no assistance -- these medications only made her drowsy. However, patient was still breathing 40 times per minute most of the day. **   RN paged Vascular on-call MD.  He came to bedside to assess patient. Groins, abdomen, and pulses assessed as well.  Labs ordered. EKG ordered.  Blood gas ordered. Verbal to give 2L NS bolus.     Ref. Range Jun 07, 2018 18:36  Sample type Unknown ARTERIAL  pH, Arterial Latest Ref Range: 7.350 - 7.450  7.261 (L)  pCO2 arterial Latest Ref Range: 32.0 - 48.0 mmHg 35.7  pO2, Arterial Latest Ref Range: 83.0 - 108.0 mmHg 70.0 (L)  TCO2 Latest Ref Range: 22 - 32 mmol/L 17 (L)  Acid-base deficit Latest Ref Range: 0.0 - 2.0 mmol/L 10.0 (H)  Bicarbonate Latest Ref Range: 20.0 - 28.0 mmol/L 16.1 (L)  O2 Saturation Latest Units: % 91.0     Ref. Range Jun 07, 2018 18:02  WBC Latest Ref Range: 4.0 - 10.5 K/uL 10.1  RBC Latest Ref Range: 3.87 - 5.11 MIL/uL 2.78 (L)  Hemoglobin Latest Ref Range: 12.0 - 15.0 g/dL 8.6 (L)  HCT Latest Ref Range: 36.0 - 46.0 % 27.3 (L)  MCV Latest Ref Range: 80.0 - 100.0 fL 98.2  MCH Latest Ref Range: 26.0 - 34.0 pg 30.9  MCHC Latest Ref Range: 30.0 - 36.0 g/dL 76.2  RDW Latest Ref Range: 11.5 - 15.5 % 13.9  Platelets Latest Ref Range: 150 - 400 K/uL 71 (L)  nRBC Latest Ref Range: 0.0 - 0.2 % 0.0     Ref. Range 2018-06-07 18:10  Troponin I Latest Ref Range: <0.03 ng/mL <0.03   After CBC resulted Vascular MD paged again regarding lab  results.    Orders for 1 unit PRBC given. Vascular MD requested ICU assistance.  **Of note: Patient remained in reverse trendelenburg for over 1 hr. It was extremely difficult to sit her up without her becoming orthostatic again. BP remained 80s/40 consistently.**   ICU team consulted and arrived at quickly at bedside.  Per ICU team: Give 3 amps HCO3 Start levophed drip Bolus current unit of blood + Prepare another unit. Bolus IVF.    Prepare for intubation.

## 2018-05-11 NOTE — Progress Notes (Signed)
On 2018/05/27 patient c/o surgical pain as 10/10. PRN Dilaudid was given 1mg  at 2039. 1hr later pt became disoriented, pulling Swan catheter and picking at her surgical incision. Pt also states "I cant breathe." Pt was on 2L [place], oxygen saturation at 98%. ABG was done and STAT chest x-ray was ordered. Vascular was paged and orders were to continue to give dilaudid for pain. MD was also notified about Swan moving 10cm. Orders to D/C Swan. Patient continued to be agitated and restless overnight. Systolic BP was 160, pt RR was 30-40's. CCM was called and 2mg  of Halodol was given. Patient continues to be restless. Patient was educated about the importance of not straining above her incision. Pt c/o back pain. Incision sites are clean, pulses are palpable, no hematoma noted. Will continue to monitor.

## 2018-05-11 NOTE — Op Note (Signed)
EMERGENCY EXPLORATORY LAPAROTOMY  Procedure Note  Gwendolyn Pham 2018/05/14 - 05/10/2018   Pre-op Diagnosis: expected hypovolemic shock     Post-op Diagnosis: Ischemic distal small bowel, right colon, and left colon  Procedure(s): EMERGENCY EXPLORATORY LAPAROTOMY SMALL BOWEL RESECTION RIGHT COLECTOMY LEFT COLECTOMY  Surgeon(s): Cephus Shelling, MD Gwendolyn Miyamoto, MD  Anesthesia: General  Staff:  Circulator: Berniece Salines, RN; Maxwell Caul, RN Perfusionist: Clementeen Hoof Physician Assistant: Dara Lords, PA-C Scrub Person: [name] Circulator Assistant: Simonne Maffucci, RN  Estimated Blood Loss: Minimal               Specimens: sent to path  Indications: This is a 63 year old female who had undergone an aortobifemoral bypass.  She had developed hypotension and a decrease in her hemoglobin so she was taken emergently to the operating room.  She was found to have no evidence of bleeding but was found to have infarcted small bowel, right colon, and left colon.  General surgery was asked to do an intraoperative consultation.  Findings: The patient's distal one third of the small bowel all the way to the cecum and up the hepatic flexure of the right colon was infarcted.  The splenic flexure down to the rectum of the left colon was also infarcted.  This may be consistent with an embolic event  Procedure: When I presented to the room in consultation, Dr. Chestine Spore it already performed the exploratory laparotomy.  He found that there was no evidence of bleeding and that the aortobifem was patent.  The patient, however, was found to have infarcted bowel.  I ran the small bowel from the ligament of Treitz to the terminal ileum.  The distal one third of the small bowel was completely infarcted.  The appendix, cecum, and right colon at the hepatic flexure was also infarcted.  The transverse colon was normal and there was a strong pulse in the middle colic artery.  The  splenic flexure down to the rectum of the left colon was also infarcted.  At this point, I transected the small bowel as distal as possible but proximal to the area of infarction with a GIA 75 stapler.  There were dusky patches in the small bowel for about a 10 cm section proximal to this.  We mobilized the right colon along the white line of [place].  We then transected the hepatic flexure with a GIA-75 stapler as well.  I then took down the mesentery with the LigaSure cautery device.  Once the complete specimen was removed, it was sent to pathology.  We then mobilized the left colon along the white line of [place] as well taking down the splenic flexure.  We then transected the distal transverse colon with a GIA-75 stapler and then the rectum where it appeared viable with the contour stapler.  I placed a 2-0 Prolene suture on the rectal stump.  We then took down the mesentery with the LigaSure device.  The left colon was then sent separately to pathology for evaluation.  We then irrigated the abdomen several liters of normal saline.  Hemostasis appeared to be achieved.  At this point we decided to go ahead and leave the abdomen open and place a wound VAC device for an anticipated second look laparotomy in 24 to 48 hours.          Gwendolyn Pham   Date: 05/06/2018  Time: 11:10 PM

## 2018-05-11 NOTE — Anesthesia Procedure Notes (Signed)
Arterial Line Insertion Start/End02/03/2018 9:35 PM, 05/11/2018 9:40 PM Performed by: Leonides Grills, MD, anesthesiologist  Patient location: OR. Preanesthetic checklist: patient identified, IV checked, site marked, risks and benefits discussed, surgical consent, monitors and equipment checked, pre-op evaluation, timeout performed and anesthesia consent Right, radial was placed Catheter size: 20 Fr Hand hygiene performed , maximum sterile barriers used  and Seldinger technique used  Attempts: 1 Procedure performed using ultrasound guided technique. Ultrasound Notes:anatomy identified, needle tip was noted to be adjacent to the nerve/plexus identified and no ultrasound evidence of intravascular and/or intraneural injection Following insertion, dressing applied and Biopatch. Post procedure assessment: normal and unchanged  Patient tolerated the procedure well with no immediate complications.

## 2018-05-12 ENCOUNTER — Inpatient Hospital Stay (HOSPITAL_COMMUNITY): Payer: Medicare Other

## 2018-05-12 ENCOUNTER — Encounter (HOSPITAL_COMMUNITY): Payer: Self-pay | Admitting: Vascular Surgery

## 2018-05-12 DIAGNOSIS — R6521 Severe sepsis with septic shock: Secondary | ICD-10-CM

## 2018-05-12 DIAGNOSIS — A419 Sepsis, unspecified organism: Secondary | ICD-10-CM

## 2018-05-12 DIAGNOSIS — G92 Toxic encephalopathy: Secondary | ICD-10-CM

## 2018-05-12 DIAGNOSIS — N170 Acute kidney failure with tubular necrosis: Secondary | ICD-10-CM

## 2018-05-12 LAB — RENAL FUNCTION PANEL
Albumin: 2.7 g/dL — ABNORMAL LOW (ref 3.5–5.0)
Albumin: 2.1 g/dL — ABNORMAL LOW (ref 3.5–5.0)
Anion gap: 8 (ref 5–15)
Anion gap: 11 (ref 5–15)
BUN: 36 mg/dL — ABNORMAL HIGH (ref 8–23)
BUN: 42 mg/dL — ABNORMAL HIGH (ref 8–23)
CALCIUM: 8.3 mg/dL — AB (ref 8.9–10.3)
CO2: 21 mmol/L — ABNORMAL LOW (ref 22–32)
CO2: 19 mmol/L — ABNORMAL LOW (ref 22–32)
Calcium: 7.5 mg/dL — ABNORMAL LOW (ref 8.9–10.3)
Chloride: 110 mmol/L (ref 98–111)
Chloride: 110 mmol/L (ref 98–111)
Creatinine, Ser: 2.69 mg/dL — ABNORMAL HIGH (ref 0.44–1.00)
Creatinine, Ser: 3.66 mg/dL — ABNORMAL HIGH (ref 0.44–1.00)
GFR calc Af Amer: 21 mL/min — ABNORMAL LOW (ref >60)
GFR calc non Af Amer: 18 mL/min — ABNORMAL LOW (ref >60)
GFR calc non Af Amer: 12 mL/min — ABNORMAL LOW (ref >60)
GFR, EST AFRICAN AMERICAN: 14 mL/min — AB (ref >60)
Glucose, Bld: 123 mg/dL — ABNORMAL HIGH (ref 70–99)
Glucose, Bld: 91 mg/dL (ref 70–99)
Phosphorus: 6.3 mg/dL — ABNORMAL HIGH (ref 2.5–4.6)
Phosphorus: 6.8 mg/dL — ABNORMAL HIGH (ref 2.5–4.6)
Potassium: 4.3 mmol/L (ref 3.5–5.1)
Potassium: 5.3 mmol/L — ABNORMAL HIGH (ref 3.5–5.1)
Sodium: 139 mmol/L (ref 135–145)
Sodium: 140 mmol/L (ref 135–145)

## 2018-05-12 LAB — CBC
HCT: 32.3 % — ABNORMAL LOW (ref 36.0–46.0)
HCT: 34 % — ABNORMAL LOW (ref 36.0–46.0)
HCT: 30.8 % — ABNORMAL LOW (ref 36.0–46.0)
HCT: 33 % — ABNORMAL LOW (ref 36.0–46.0)
HCT: 31.8 % — ABNORMAL LOW (ref 36.0–46.0)
Hemoglobin: 10.7 g/dL — ABNORMAL LOW (ref 12.0–15.0)
Hemoglobin: 11.5 g/dL — ABNORMAL LOW (ref 12.0–15.0)
Hemoglobin: 10.7 g/dL — ABNORMAL LOW (ref 12.0–15.0)
Hemoglobin: 11.5 g/dL — ABNORMAL LOW (ref 12.0–15.0)
Hemoglobin: 10.9 g/dL — ABNORMAL LOW (ref 12.0–15.0)
MCH: 30 pg (ref 26.0–34.0)
MCH: 30.3 pg (ref 26.0–34.0)
MCH: 30.8 pg (ref 26.0–34.0)
MCH: 30.2 pg (ref 26.0–34.0)
MCH: 30.8 pg (ref 26.0–34.0)
MCHC: 33.8 g/dL (ref 30.0–36.0)
MCHC: 34.7 g/dL (ref 30.0–36.0)
MCHC: 34.8 g/dL (ref 30.0–36.0)
MCHC: 34.3 g/dL (ref 30.0–36.0)
MCHC: 33.1 g/dL (ref 30.0–36.0)
MCV: 89.7 fL (ref 80.0–100.0)
MCV: 88.8 fL (ref 80.0–100.0)
MCV: 88.5 fL (ref 80.0–100.0)
MCV: 88.1 fL (ref 80.0–100.0)
MCV: 90.5 fL (ref 80.0–100.0)
PLATELETS: 87 10*3/uL — AB (ref 150–400)
Platelets: 85 10*3/uL — ABNORMAL LOW (ref 150–400)
Platelets: 87 10*3/uL — ABNORMAL LOW (ref 150–400)
Platelets: 81 10*3/uL — ABNORMAL LOW (ref 150–400)
Platelets: 80 10*3/uL — ABNORMAL LOW (ref 150–400)
RBC: 3.57 MIL/uL — ABNORMAL LOW (ref 3.87–5.11)
RBC: 3.79 MIL/uL — ABNORMAL LOW (ref 3.87–5.11)
RBC: 3.47 MIL/uL — ABNORMAL LOW (ref 3.87–5.11)
RBC: 3.73 MIL/uL — ABNORMAL LOW (ref 3.87–5.11)
RBC: 3.61 MIL/uL — ABNORMAL LOW (ref 3.87–5.11)
RDW: 14.9 % (ref 11.5–15.5)
RDW: 16.4 % — ABNORMAL HIGH (ref 11.5–15.5)
RDW: 17.2 % — ABNORMAL HIGH (ref 11.5–15.5)
RDW: 17.2 % — ABNORMAL HIGH (ref 11.5–15.5)
RDW: 17.2 % — ABNORMAL HIGH (ref 11.5–15.5)
WBC: 3.4 10*3/uL — ABNORMAL LOW (ref 4.0–10.5)
WBC: 4.5 10*3/uL (ref 4.0–10.5)
WBC: 4.1 10*3/uL (ref 4.0–10.5)
WBC: 4.7 10*3/uL (ref 4.0–10.5)
WBC: 6.8 10*3/uL (ref 4.0–10.5)
nRBC: 0 % (ref 0.0–0.2)
nRBC: 0 % (ref 0.0–0.2)
nRBC: 0 % (ref 0.0–0.2)
nRBC: 0 % (ref 0.0–0.2)
nRBC: 0 % (ref 0.0–0.2)

## 2018-05-12 LAB — POCT I-STAT 7, (LYTES, BLD GAS, ICA,H+H)
ACID-BASE DEFICIT: 4 mmol/L — AB (ref 0.0–2.0)
Acid-base deficit: 7 mmol/L — ABNORMAL HIGH (ref 0.0–2.0)
Acid-base deficit: 9 mmol/L — ABNORMAL HIGH (ref 0.0–2.0)
Acid-base deficit: 5 mmol/L — ABNORMAL HIGH (ref 0.0–2.0)
Bicarbonate: 18.3 mmol/L — ABNORMAL LOW (ref 20.0–28.0)
Bicarbonate: 19 mmol/L — ABNORMAL LOW (ref 20.0–28.0)
Bicarbonate: 20.8 mmol/L (ref 20.0–28.0)
Bicarbonate: 20.9 mmol/L (ref 20.0–28.0)
CALCIUM ION: 0.97 mmol/L — AB (ref 1.15–1.40)
CALCIUM ION: 1.07 mmol/L — AB (ref 1.15–1.40)
Calcium, Ion: 1.2 mmol/L (ref 1.15–1.40)
Calcium, Ion: 1.19 mmol/L (ref 1.15–1.40)
HCT: 25 % — ABNORMAL LOW (ref 36.0–46.0)
HCT: 29 % — ABNORMAL LOW (ref 36.0–46.0)
HCT: 22 % — ABNORMAL LOW (ref 36.0–46.0)
HCT: 31 % — ABNORMAL LOW (ref 36.0–46.0)
HEMOGLOBIN: 8.5 g/dL — AB (ref 12.0–15.0)
Hemoglobin: 9.9 g/dL — ABNORMAL LOW (ref 12.0–15.0)
Hemoglobin: 7.5 g/dL — ABNORMAL LOW (ref 12.0–15.0)
Hemoglobin: 10.5 g/dL — ABNORMAL LOW (ref 12.0–15.0)
O2 Saturation: 93 %
O2 Saturation: 97 %
O2 Saturation: 89 %
O2 Saturation: 99 %
PH ART: 7.236 — AB (ref 7.350–7.450)
Patient temperature: 34
Patient temperature: 36
Patient temperature: 34.5
Patient temperature: 94.4
Potassium: 4.2 mmol/L (ref 3.5–5.1)
Potassium: 4.5 mmol/L (ref 3.5–5.1)
Potassium: 4 mmol/L (ref 3.5–5.1)
Potassium: 4.2 mmol/L (ref 3.5–5.1)
SODIUM: 142 mmol/L (ref 135–145)
SODIUM: 141 mmol/L (ref 135–145)
Sodium: 140 mmol/L (ref 135–145)
Sodium: 141 mmol/L (ref 135–145)
TCO2: 20 mmol/L — ABNORMAL LOW (ref 22–32)
TCO2: 20 mmol/L — ABNORMAL LOW (ref 22–32)
TCO2: 22 mmol/L (ref 22–32)
TCO2: 22 mmol/L (ref 22–32)
pCO2 arterial: 34 mmHg (ref 32.0–48.0)
pCO2 arterial: 42.6 mmHg (ref 32.0–48.0)
pCO2 arterial: 32.8 mmHg (ref 32.0–48.0)
pCO2 arterial: 37.2 mmHg (ref 32.0–48.0)
pH, Arterial: 7.34 — ABNORMAL LOW (ref 7.350–7.450)
pH, Arterial: 7.399 (ref 7.350–7.450)
pH, Arterial: 7.345 — ABNORMAL LOW (ref 7.350–7.450)
pO2, Arterial: 74 mmHg — ABNORMAL LOW (ref 83.0–108.0)
pO2, Arterial: 83 mmHg (ref 83.0–108.0)
pO2, Arterial: 48 mmHg — ABNORMAL LOW (ref 83.0–108.0)
pO2, Arterial: 136 mmHg — ABNORMAL HIGH (ref 83.0–108.0)

## 2018-05-12 LAB — BASIC METABOLIC PANEL
Anion gap: 12 (ref 5–15)
BUN: 39 mg/dL — ABNORMAL HIGH (ref 8–23)
CALCIUM: 8 mg/dL — AB (ref 8.9–10.3)
CO2: 21 mmol/L — ABNORMAL LOW (ref 22–32)
Chloride: 108 mmol/L (ref 98–111)
Creatinine, Ser: 3.14 mg/dL — ABNORMAL HIGH (ref 0.44–1.00)
GFR calc Af Amer: 17 mL/min — ABNORMAL LOW (ref >60)
GFR calc non Af Amer: 15 mL/min — ABNORMAL LOW (ref >60)
Glucose, Bld: 106 mg/dL — ABNORMAL HIGH (ref 70–99)
Potassium: 4.2 mmol/L (ref 3.5–5.1)
Sodium: 141 mmol/L (ref 135–145)

## 2018-05-12 LAB — COMPREHENSIVE METABOLIC PANEL
ALT: 520 U/L — ABNORMAL HIGH (ref 0–44)
AST: 621 U/L — ABNORMAL HIGH (ref 15–41)
Albumin: 2.4 g/dL — ABNORMAL LOW (ref 3.5–5.0)
Alkaline Phosphatase: 45 U/L (ref 38–126)
Anion gap: 12 (ref 5–15)
BUN: 41 mg/dL — ABNORMAL HIGH (ref 8–23)
CALCIUM: 7.9 mg/dL — AB (ref 8.9–10.3)
CO2: 19 mmol/L — AB (ref 22–32)
Chloride: 109 mmol/L (ref 98–111)
Creatinine, Ser: 3.49 mg/dL — ABNORMAL HIGH (ref 0.44–1.00)
GFR calc Af Amer: 15 mL/min — ABNORMAL LOW (ref >60)
GFR calc non Af Amer: 13 mL/min — ABNORMAL LOW (ref >60)
Glucose, Bld: 87 mg/dL (ref 70–99)
Potassium: 5.1 mmol/L (ref 3.5–5.1)
Sodium: 140 mmol/L (ref 135–145)
Total Bilirubin: 2 mg/dL — ABNORMAL HIGH (ref 0.3–1.2)
Total Protein: 4.3 g/dL — ABNORMAL LOW (ref 6.5–8.1)

## 2018-05-12 LAB — PREPARE FRESH FROZEN PLASMA
UNIT DIVISION: 0
Unit division: 0

## 2018-05-12 LAB — HEPATIC FUNCTION PANEL
ALT: 565 U/L — ABNORMAL HIGH (ref 0–44)
AST: 804 U/L — AB (ref 15–41)
Albumin: 2.7 g/dL — ABNORMAL LOW (ref 3.5–5.0)
Alkaline Phosphatase: 38 U/L (ref 38–126)
BILIRUBIN TOTAL: 1.9 mg/dL — AB (ref 0.3–1.2)
Bilirubin, Direct: 0.7 mg/dL — ABNORMAL HIGH (ref 0.0–0.2)
Indirect Bilirubin: 1.2 mg/dL — ABNORMAL HIGH (ref 0.3–0.9)
Total Protein: 4.4 g/dL — ABNORMAL LOW (ref 6.5–8.1)

## 2018-05-12 LAB — BPAM FFP
Blood Product Expiration Date: 202002292359
Blood Product Expiration Date: 202002292359
ISSUE DATE / TIME: 202002272125
ISSUE DATE / TIME: 202002272125
Unit Type and Rh: 6200
Unit Type and Rh: 6200

## 2018-05-12 LAB — AMMONIA: Ammonia: 20 umol/L (ref 9–35)

## 2018-05-12 LAB — PREPARE PLATELET PHERESIS: Unit division: 0

## 2018-05-12 LAB — BPAM PLATELET PHERESIS
BLOOD PRODUCT EXPIRATION DATE: 202002292359
ISSUE DATE / TIME: 202002272130
Unit Type and Rh: 6200

## 2018-05-12 LAB — LACTIC ACID, PLASMA
Lactic Acid, Venous: 3.4 mmol/L (ref 0.5–1.9)
Lactic Acid, Venous: 3.5 mmol/L (ref 0.5–1.9)
Lactic Acid, Venous: 3.8 mmol/L (ref 0.5–1.9)

## 2018-05-12 LAB — GLUCOSE, CAPILLARY
GLUCOSE-CAPILLARY: 77 mg/dL (ref 70–99)
Glucose-Capillary: 89 mg/dL (ref 70–99)
Glucose-Capillary: 83 mg/dL (ref 70–99)
Glucose-Capillary: 80 mg/dL (ref 70–99)
Glucose-Capillary: 68 mg/dL — ABNORMAL LOW (ref 70–99)
Glucose-Capillary: 125 mg/dL — ABNORMAL HIGH (ref 70–99)
Glucose-Capillary: 68 mg/dL — ABNORMAL LOW (ref 70–99)
Glucose-Capillary: 85 mg/dL (ref 70–99)
Glucose-Capillary: 85 mg/dL (ref 70–99)

## 2018-05-12 LAB — PROTIME-INR
INR: 1.4 — ABNORMAL HIGH (ref 0.8–1.2)
Prothrombin Time: 16.8 seconds — ABNORMAL HIGH (ref 11.4–15.2)

## 2018-05-12 LAB — FIBRINOGEN: Fibrinogen: 410 mg/dL (ref 210–475)

## 2018-05-12 LAB — PHOSPHORUS: Phosphorus: 6.7 mg/dL — ABNORMAL HIGH (ref 2.5–4.6)

## 2018-05-12 LAB — MAGNESIUM: Magnesium: 2.1 mg/dL (ref 1.7–2.4)

## 2018-05-12 MED ORDER — VANCOMYCIN VARIABLE DOSE PER UNSTABLE RENAL FUNCTION (PHARMACIST DOSING)
Status: DC
  Administered 2018-05-12 – 2018-05-13 (×2): 1

## 2018-05-12 MED ORDER — METOPROLOL TARTRATE 5 MG/5ML IV SOLN
5.0000 mg | Freq: Three times a day (TID) | INTRAVENOUS | Status: DC
  Administered 2018-05-12 – 2018-05-26 (×36): 5 mg via INTRAVENOUS
  Filled 2018-05-12 (×38): qty 5

## 2018-05-12 MED ORDER — DEXTROSE 50 % IV SOLN
INTRAVENOUS | Status: AC
  Administered 2018-05-12: 25 mL
  Filled 2018-05-12: qty 50

## 2018-05-12 MED ORDER — PLASMA-LYTE A IV SOLN
INTRAVENOUS | Status: DC
  Administered 2018-05-12 (×2): via INTRAVENOUS

## 2018-05-12 MED ORDER — SODIUM CHLORIDE 0.9 % IV BOLUS
500.0000 mL | Freq: Once | INTRAVENOUS | Status: AC
  Administered 2018-05-12: 500 mL via INTRAVENOUS

## 2018-05-12 MED ORDER — ORAL CARE MOUTH RINSE
15.0000 mL | OROMUCOSAL | Status: DC
  Administered 2018-05-12 – 2018-05-18 (×52): 15 mL via OROMUCOSAL

## 2018-05-12 MED ORDER — SODIUM CHLORIDE 0.9 % IV SOLN
INTRAVENOUS | Status: DC | PRN
  Administered 2018-05-12: 22:00:00 via INTRAVENOUS

## 2018-05-12 MED ORDER — VANCOMYCIN HCL 10 G IV SOLR
1250.0000 mg | Freq: Once | INTRAVENOUS | Status: AC
  Administered 2018-05-12: 1250 mg via INTRAVENOUS
  Filled 2018-05-12: qty 1250

## 2018-05-12 MED ORDER — LACTATED RINGERS IV SOLN
INTRAVENOUS | Status: DC
  Administered 2018-05-12 (×3): via INTRAVENOUS

## 2018-05-12 MED ORDER — CHLORHEXIDINE GLUCONATE 0.12% ORAL RINSE (MEDLINE KIT)
15.0000 mL | Freq: Two times a day (BID) | OROMUCOSAL | Status: DC
  Administered 2018-05-12 – 2018-05-18 (×12): 15 mL via OROMUCOSAL

## 2018-05-12 MED ORDER — SODIUM CHLORIDE 0.9 % IV SOLN
INTRAVENOUS | Status: DC
  Administered 2018-05-12 – 2018-05-13 (×2): via INTRAVENOUS

## 2018-05-12 MED ORDER — LABETALOL HCL 5 MG/ML IV SOLN
10.0000 mg | INTRAVENOUS | Status: DC | PRN
  Administered 2018-05-12: 10 mg via INTRAVENOUS
  Filled 2018-05-12: qty 4

## 2018-05-12 MED ORDER — SODIUM CHLORIDE 0.9 % IV SOLN
500.0000 mg | Freq: Two times a day (BID) | INTRAVENOUS | Status: DC
  Administered 2018-05-12 – 2018-05-13 (×3): 500 mg via INTRAVENOUS
  Filled 2018-05-12 (×4): qty 0.5

## 2018-05-12 MED ORDER — LACTATED RINGERS IV BOLUS
1000.0000 mL | Freq: Once | INTRAVENOUS | Status: AC
  Administered 2018-05-12: 1000 mL via INTRAVENOUS

## 2018-05-12 NOTE — Progress Notes (Signed)
Patient transported on vent from 2H05 to CT and back to 2H05. No complications noted.

## 2018-05-12 NOTE — Progress Notes (Signed)
Patient transported on the ventilator from OR to 2H05 with no complications. Vitals stable.

## 2018-05-12 NOTE — Progress Notes (Signed)
OT Cancellation Note  Patient Details Name: Gwendolyn Pham MRN: [id number] DOB: [birth year]   Cancelled Treatment:    Reason Eval/Treat Not Completed: Medical issues which prohibited therapy(Pt intubated. Will continue to follow.)  Evern Bio 05/12/2018, 8:42 AM  Martie Round, OTR/L Acute Rehabilitation Services Pager: [phone] Office: [phone]

## 2018-05-12 NOTE — Progress Notes (Signed)
CRITICAL VALUE ALERT  Critical Value: Lactic 3.8  Date & Time Notied:  05/12/2018 -- 1127  Provider Notified: Dr. Delton Coombes   Orders Received/Actions taken: No new orders at this time

## 2018-05-12 NOTE — Progress Notes (Signed)
Patient's blood pressure went up. Systolic (170-190) and diastolic (90-100) called Elink and doctor ordered Labetalol 10mg  q2hrs prn and standing dose of metoprolol.

## 2018-05-12 NOTE — Consult Note (Signed)
Neurology Consultation  Reason for Consult: Altered mental status, blown pupils Referring Physician: Dr. Arbie CookeyEarly  CC: Unresponsiveness, mydriasis  History is obtained from: Chart review, patient RN  HPI: Gwendolyn Pham is a 63 y.o. female who has a past medical history of diabetes, hypertension, hyperlipidemia, COPD, asthma anxiety, currently admitted for treatment of peripheral vascular disease and underwent aortobifemoral bypass, following which she had a distended abdomen, hypotension and found to have ischemic bowel, for which she was taken in for an emergent exploratory laparotomy yesterday.  The examination this morning from the primary team revealed dilated pupils and a stat neurological consult was requested.  I requested a stat CT head to be done and saw the patient right after the CT scan was done. Patient's RN give me the short course of her current hospitalization as documented above. I have reviewed the patient's chart.  I reviewed the lab values.  Independently reviewed imaging.  LKW: 24 hours ago tpa given?: no, recent ex lap, hypotension and anemia Premorbid modified Rankin scale (mRS): 0   ROS:  Unable to obtain due to altered mental status.   Past Medical History:  Diagnosis Date  . Anxiety   . Asthma   . COPD (chronic obstructive pulmonary disease) (HCC)   . Depression   . Diabetes mellitus without complication (HCC)   . Hyperlipidemia   . Hypertension     Family History  Problem Relation Age of Onset  . Hypertension Mother   . Diabetes Mother   . COPD Father   . Heart disease Father [id number]       AMI x 3/CABG  . Diabetes Father   . Diabetes Sister   . Mental illness Sister        schizoaffective  . Diabetes Sister   . Arthritis Sister     Social History:   reports that she has been smoking cigarettes. She has a 25.00 pack-year smoking history. She has never used smokeless tobacco. She reports that she does not drink alcohol or use  drugs.  Medications  Current Facility-Administered Medications:  .  acetaminophen (TYLENOL) tablet 325-650 mg, 325-650 mg, Oral, Q4H PRN **OR** acetaminophen (TYLENOL) suppository 325-650 mg, 325-650 mg, Rectal, Q4H PRN, Rhyne, Samantha J, PA-C .  atorvastatin (LIPITOR) tablet 80 mg, 80 mg, Per Tube, Daily, Rhyne, Samantha J, PA-C .  bisacodyl (DULCOLAX) suppository 10 mg, 10 mg, Rectal, Daily PRN, Rhyne, Samantha J, PA-C, 10 mg at June 27, 2018 2210 .  calcium gluconate 1 g/ 50 mL sodium chloride IVPB, 1 g, Intravenous, Once, Rhyne, Samantha J, PA-C .  chlorhexidine (PERIDEX) 0.12 % solution 15 mL, 15 mL, Mouth Rinse, BID, Rhyne, Samantha J, PA-C .  electrolyte-A (PLASMALYTE-A PH 7.4) infusion, , Intravenous, Continuous, Rosario, Luis R, MD .  fentaNYL (SUBLIMAZE) bolus via infusion 50 mcg, 50 mcg, Intravenous, Q1H PRN, Rhyne, Samantha J, PA-C .  fentaNYL 2500mcg in NS 250mL (3010mcg/ml) infusion-PREMIX, 25-400 mcg/hr, Intravenous, Continuous, Rhyne, Samantha J, PA-C, Last Rate: 5 mL/hr at 05/12/18 0400, 50 mcg/hr at 05/12/18 0400 .  insulin aspart (novoLOG) injection 0-9 Units, 0-9 Units, Subcutaneous, Q4H, Rhyne, Samantha J, PA-C .  labetalol (NORMODYNE,TRANDATE) injection 10 mg, 10 mg, Intravenous, Q2H PRN, Deterding, Dorise HissElizabeth C, MD, 10 mg at 05/12/18 0301 .  MEDLINE mouth rinse, 15 mL, Mouth Rinse, BID, Rhyne, Samantha J, PA-C, 15 mL at 04/24/2018 1116 .  MEDLINE mouth rinse, 15 mL, Mouth Rinse, q12n4p, Rhyne, Samantha J, PA-C .  metoprolol tartrate (LOPRESSOR) injection 5 mg, 5 mg, Intravenous, Q8H,  Deterding, Dorise Hiss, MD .  midazolam (VERSED) injection 2 mg, 2 mg, Intravenous, Q15 min PRN, Rhyne, Samantha J, PA-C .  midazolam (VERSED) injection 2 mg, 2 mg, Intravenous, Q2H PRN, Rhyne, Samantha J, PA-C .  norepinephrine (LEVOPHED) 4mg  in premix infusion, 0-40 mcg/min, Intravenous, Titrated, Rhyne, Samantha J, PA-C, Last Rate: 18.75 mL/hr at 05/07/2018 2255, 5 mcg/min at 04/26/2018 2255 .   ondansetron (ZOFRAN) injection 4 mg, 4 mg, Intravenous, Q6H PRN, Rhyne, Samantha J, PA-C, 4 mg at 04/15/2018 1447 .  pantoprazole (PROTONIX) injection 40 mg, 40 mg, Intravenous, Daily, Rhyne, Samantha J, PA-C .  phenol (CHLORASEPTIC) mouth spray 1 spray, 1 spray, Mouth/Throat, PRN, Rhyne, Samantha J, PA-C, 1 spray at 05/06/2018 1547 .  potassium chloride SA (K-DUR,KLOR-CON) CR tablet 20-40 mEq, 20-40 mEq, Oral, Daily PRN, Rhyne, Samantha J, PA-C .  sodium chloride 0.9 % bolus 1,000 mL, 1,000 mL, Intravenous, Once, Rhyne, Samantha J, PA-C   Exam: Current vital signs: BP (!) 98/58   Pulse (!) 119   Temp (!) 101.1 F (38.4 C)   Resp 20   Ht 5\' 4"  (1.626 m)   Wt 61.7 kg   SpO2 98%   BMI 23.35 kg/m  Vital signs in last 24 hours: Temp:  [94.2 F (34.6 C)-101.1 F (38.4 C)] 101.1 F (38.4 C) (02/28 0700) Pulse Rate:  [97-128] 119 (02/28 0742) Resp:  [20-50] 20 (02/28 0700) BP: (30-147)/(22-105) 98/58 (02/28 0700) SpO2:  [76 %-100 %] 98 % (02/28 0742) FiO2 (%):  [80 %-100 %] 80 % (02/28 0742) General exam-intubated, minimal sedation HEENT: Normocephalic atraumatic Abdomen: VAC in place CVS: S1-2 heard, tachycardic Respiratory: Scattered rales Extremities: Warm well perfused Neurological exam Patient sedated minimally and intubated She is spontaneously moving all 4 extremities. She is following simple commands consistently She opens eyes to voice and is able to nod yes and no at times as well. Poor attention concentration. Cranial nerves: Pupils are 4 mm and sluggishly reactive bilaterally.  There is no gaze deviation.  She blinks to threat from both sides.  Facial symmetry is difficult to ascertain due to the endotracheal tube.  Auditory acuity seems intact based on conversation. Motor exam: She is able to move left arm antigravity, right arm has multiple lines and a immobilizer and seemed a little weaker than the left, she is able to wiggle her toes on both legs and is able to lift her  right leg antigravity and left leg appears mildly weaker. Sensory exam intact to noxious stimulation as evidenced by active withdrawal in all fours. Coordination difficult to assess due to her mentation at this time  Gait also cannot be assessed due to intubation. NIHSS 1a Level of Conscious.: 1 1b LOC Questions: 1 1c LOC Commands: 0 2 Best Gaze: 0 3 Visual: 0 4 Facial Palsy: 0 5a Motor Arm - left: 1 5b Motor Arm - Right: 1 6a Motor Leg - Left: 1 6b Motor Leg - Right: 1 7 Limb Ataxia: 0 8 Sensory: 0 9 Best Language: 0 10 Dysarthria: un 11 Extinct. and Inatten.: 0 TOTAL: 6  Labs I have reviewed labs in epic and the results pertinent to this consultation are: CBC    Component Value Date/Time   WBC 4.1 05/12/2018 0825   RBC 3.47 (L) 05/12/2018 0825   HGB 10.7 (L) 05/12/2018 0825   HGB 15.8 01/23/2018 1218   HCT 30.8 (L) 05/12/2018 0825   HCT 45.9 01/23/2018 1218   PLT 81 (L) 05/12/2018 0825   PLT  253 01/23/2018 1218   MCV 88.8 05/12/2018 0825   MCV 93 01/23/2018 1218   MCH 30.8 05/12/2018 0825   MCHC 34.7 05/12/2018 0825   RDW 17.2 (H) 05/12/2018 0825   RDW 13.7 01/23/2018 1218   LYMPHSABS 1.1 05-27-18 1802   LYMPHSABS 2.8 01/23/2018 1218   MONOABS 0.3 May 27, 2018 1802   EOSABS 0.0 05-27-2018 1802   EOSABS 0.1 01/23/2018 1218   BASOSABS 0.0 2018/05/27 1802   BASOSABS 0.1 01/23/2018 1218    CMP     Component Value Date/Time   NA 141 05/12/2018 0415   NA 137 01/23/2018 1218   K 4.2 05/12/2018 0415   CL 108 05/12/2018 0415   CO2 21 (L) 05/12/2018 0415   GLUCOSE 106 (H) 05/12/2018 0415   BUN 39 (H) 05/12/2018 0415   BUN 11 01/23/2018 1218   CREATININE 3.14 (H) 05/12/2018 0415   CREATININE 0.66 06/18/2015 1855   CALCIUM 8.0 (L) 05/12/2018 0415   PROT 4.4 (L) 05/12/2018 0415   PROT 7.2 01/23/2018 1218   ALBUMIN 2.7 (L) 05/12/2018 0415   ALBUMIN 4.3 01/23/2018 1218   AST 804 (H) 05/12/2018 0415   ALT 565 (H) 05/12/2018 0415   ALKPHOS 38 05/12/2018 0415    BILITOT 1.9 (H) 05/12/2018 0415   BILITOT 0.2 01/23/2018 1218   GFRNONAA 15 (L) 05/12/2018 0415   GFRNONAA >89 01/31/2014 1532   GFRAA 17 (L) 05/12/2018 0415   GFRAA >89 01/31/2014 1532  Normal creat at baseline, now increased to 3.14  Imaging I have reviewed the images obtained: CT-scan of the brain done stat shows right frontal hypodensity which probably is chronic.  Discussed with neuro labs over the phone- they seem to be in agreement.  Formal read pending.  Assessment: 63 year old woman past medical history of diabetes hypertension hyperlipidemia COPD asthma peripheral vascular disease anxiety, currently admitted for treatment of peripheral vascular disease status post aortobifemoral bypass.  Hospital course complicated by hypotension and ischemic bowel requiring exploratory laparotomy yesterday.  Primary team concern for worsening mentation and possible dilated pupils. On my examination, patient has mildly dilated pupils but they are reactive although sluggishly.  She is able to follow simple commands consistently.  There is some generalized weakness, but nothing focal that I could point to at this time. Her labs revealed multiple toxic metabolic derangements including deranged renal and hepatic function. I would attribute her current presentation neurologically to toxic metabolic encephalopathy.  Noncontrast CT of the head does not show any acute changes.  Impression: Likely toxic metabolic encephalopathy  Recommendations: -Continue medical and surgical treatment per primary team's as you are. -If her mentation does not improve, I would recommend obtaining an MRI of the brain for further evaluation for any evidence of watershed infarcts as she did have episodes of hypotension. -I would also check an ammonia level and correct that if it is elevated. -She does have significantly increased creatinine from baseline.  I would defer the management per PCCM/nephrology as deemed  appropriate. -I think after the derangements have been corrected, if she does not come around in terms of her mentation, MRI should be considered.  An EEG at that time should also be considered.  Also consider an EEG if the mentation waxes and wanes.  At this time though I think, the yield of MRI and EEG is not going to be much as its not been a change course of action.  I discussed my plan in detail with Dr. Delton Coombes, PCCM attending, at the bedside in  the unit.  Neurology services will be available as needed.  -- Milon Dikes, MD [facility] Pager: [phone] If 7pm to 7am, please call on call as listed on AMION.  CRITICAL CARE ATTESTATION Performed by: Milon Dikes, MD Total critical care time: 35 minutes Critical care time was exclusive of separately billable procedures and treating other patients and/or supervising APPs/Residents/Students Critical care was necessary to treat or prevent imminent or life-threatening deterioration due to toxic metabolic encephalopathy. This patient is critically ill and at significant risk for neurological worsening and/or death and care requires constant monitoring. Critical care was time spent personally by me on the following activities: development of treatment plan with patient and/or surrogate as well as nursing, discussions with consultants, evaluation of patient's response to treatment, examination of patient, obtaining history from patient or surrogate, ordering and performing treatments and interventions, ordering and review of laboratory studies, ordering and review of radiographic studies, pulse oximetry, re-evaluation of patient's condition, participation in multidisciplinary rounds and medical decision making of high complexity in the care of this patient.

## 2018-05-12 NOTE — Progress Notes (Signed)
CCM MD paged regarding patient's UOP of 10 cc/hr for 3 consecutive hours.   Orders to be placed for labs this evening.   RN will continue to monitor patient.

## 2018-05-12 NOTE — Progress Notes (Signed)
NAME:  Gwendolyn Pham, MRN:  [id number], DOB:  1955-05-06, LOS: 2 ADMISSION DATE:  04/22/2018, CONSULTATION DATE:  04/19/2018 REFERRING MD:  Chestine Spore  CHIEF COMPLAINT:  Hypotension   Brief History   Gwendolyn Pham is a 63 y.o. female who was admitted 2/26 for aorto femoral bypass.  2/27 had hypotension and respiratory distress.  Intubated that night and getting transfused 2u PRBC.  Vascular surgery following and planning to take back to OR if hypotension does not resolve with blood products.  History of present illness   Pt is encephelopathic; therefore, this HPI is obtained from chart review. Gwendolyn Pham is a 63 y.o. female who has a PMH as outlined below (see "past medical history").  She presented to [facility] 2/26 for aortofemoral bypass due to occlusive disease.  Procedure was completed without complications.  During afternoon hours of 2/27, she sat up in bed to try and use the commode and shortly thereafter, she became hypotensive.  She was given 3L IVF bolus but BP remained soft.  Hgb had dropped from 11.1 to 8.6.  Vascular surgery was called who felt that pt needed to be intubated while further workup continued.  PCCM was called in consultation.  At the time of our evaluation, pt is awake but is in distress.  She is pale appearing.  SBP in the 80s.  She has had very low UOP, essentially anuric.  She is currently receiving 1u PRBC.  Past Medical History  PVD, HTN, HLD, DM, depression, COPD, asthma, anxiety.  Significant Hospital Events   2/26 > admit, taken to OR for aortofemoral bypass. 2/27 > intubated.  Septic shock 2/27 > emergent exploratory laparotomy, small bowel resection, bilateral colectomy  Consults:  PCCM. General surgery  Procedures:  ETT 2/27 >  Art line 2/27 >   Significant Diagnostic Tests:  CXR 2/27 > mild atelectasis. Renal US 2/28 > Head CT 2/28 >> chronically advanced cerebral white matter disease, no acute abnormality  Micro Data:  Blood 2/28 >>   Antimicrobials:    Meropenem 2/28 >>  Vancomycin 2/28 >>   Interim history/subjective:  To the OR for bowel resection due to bowel ischemia overnight, returns ventilated, hemodynamically improved Oliguric but starting to have urine output, 16 cc in the last 2 hours  Objective:  Blood pressure (!) 98/58, pulse (!) 119, temperature (!) 101.1 F (38.4 C), resp. rate 20, height  (1.626 m), weight 61.7 kg, SpO2 98 %. CVP:  [8 mmHg-17 mmHg] 17 mmHg  Vent Mode: PRVC FiO2 (%):  [80 %-100 %] 80 % Set Rate:  [20 bmp] 20 bmp Vt Set:  [440 mL] 440 mL PEEP:  [5 cmH20] 5 cmH20 Plateau Pressure:  [18 cmH20-21 cmH20] 21 cmH20   Intake/Output Summary (Last 24 hours) at 05/12/2018 0946 Last data filed at 05/12/2018 0800 Gross per 24 hour  Intake 5263.39 ml  Output 1068 ml  Net 4195.39 ml   Filed Weights   05/12/2018 0543 05/07/2018 1700  Weight: 61.7 kg 61.7 kg    Examination: General: Ill-appearing woman, sedated, intubated Neuro: Wakes to stimulation, not to voice, did follow commands, pupils large and sluggish but do react HEENT: Endotracheal tube in place, oropharynx otherwise clear, moist Cardiovascular: Regular, borderline tachycardic, no murmur Lungs: Coarse bilaterally but no overt wheezes crackles Abdomen: Abdomen is left open with a wound VAC in place, no bowel sounds heard Musculoskeletal: No significant edema Skin: Pale, cool, no rash   Resolved Problems:  Hypocalcemia  Assessment & Plan:  Acute respiratory failure due to ischemic bowel, septic shock Continue current ventilator support. ABG 2/28 and adjust FiO2, minute ventilation accordingly Follow chest x-ray, at high risk for acute lung injury  Bowel ischemia, status post bowel resection 2/27 Open abdomen with wound VAC in place.  Management as per CCS Likely back to the operating room for reexploration and possible closure in the next 24-48 hours  Peripheral vascular disease, aortobifemoral bypass 2/26 As per vascular surgery  recommendations. Would like to get her back on any appropriate anticoagulation as soon as possible.  Question whether she would need heparin to facilitate return to the OR when needed.  I will defer this to the VVS.  Septic shock, source consistent with ischemic bowel, possible peritonitis Norepinephrine, currently weaned to off, goal MAP > 65 Blood cultures obtained, pending Empiric antibiotics 2/28, meropenem + vancomycin Aspirin and enoxaparin held 2/27 given concern for possible hemorrhagic component (not seen on exploratory lap).  Plan restart once hemodynamically stable  Acute renal failure, presumed ATN Lactic acidosis, anion gap metabolic acidosis IV fluid resuscitation Follow BMP, lactic acid.  No clear indication for urgent hemodialysis but if she does remain oliguric then we will consult nephrology. Renal ultrasound is pending  Transaminitis - presumed due to hypotension / low flow.  Rising 2/28 Continue to follow LFT Check coags Ammonia pending Consider hepatic imaging if trend is inconsistent with hypoperfusion injury  Anemia - as above, concern for hemorrhage but reassuring eval on exploratory laparotomy Thrombocytopenia - unclear etiology, suspect due to sepsis plus possible component of consumption.  DIC panel with adequate fibrinogen, no schistocytes Received 2 units PRBC, transfusion goal Hgb > 7 in absence active blood loss Follow serial CBC  Hx HTN, HLD. Preadmission hydrochlorothiazide, metoprolol, atorvastatin all on hold  Hx DM. Metformin on hold Glucose control with sliding scale insulin per protocol  Hx depression, anxiety. Bupropion and venlafaxine on hold   Best Practice:  Diet: NPO. Pain/Anxiety/Delirium protocol (if indicated): fentanyl gtt / midazolam PRN.  RASS goal -1. VAP protocol (if indicated): In place. DVT prophylaxis: SCD's only. GI prophylaxis: PPI. Glucose control: SSI. Mobility: Bedrest. Code Status: Full. Family Communication:  Dr. Delton Coombes updated sister 2/28 Disposition: ICU.  Labs   CBC: Recent Labs  Lab 05/08/2018 1802  04/25/2018 1955 04/24/2018 1959  05/10/2018 2308 05/12/18 0006 05/12/18 0216 05/12/18 0415 05/12/18 0825  WBC 10.1  --  9.0  --   --   --  3.4*  --  4.5 4.1  NEUTROABS 8.8*  --   --   --   --   --   --   --   --   --   HGB 8.6*   < > 7.9*  --    < > 7.5* 10.7* 10.5* 11.5* 10.7*  HCT 27.3*   < > 24.3*  --    < > 22.0* 32.3* 31.0* 34.0* 30.8*  MCV 98.2  --  96.0  --   --   --  90.5  --  89.7 88.8  PLT 71*  --  57* 58*  --   --  85*  --  87* 81*   < > = values in this interval not displayed.   Basic Metabolic Panel: Recent Labs  Lab 05/02/2018 1207  04/20/2018 0508 05/03/2018 1613 04/30/2018 1814  04/21/2018 1955  04/24/2018 2221 05/06/2018 2308 05/12/18 0006 05/12/18 0216 05/12/18 0415  NA 135   < > 130* 132* 134*   < > 139   < >  142 141 139 141 141  K 4.0   < > 5.5* 5.2* 5.2*   < > 5.4*   < > 4.2 4.0 4.3 4.2 4.2  CL 104  --  99 103  --   --  108  --   --   --  110  --  108  CO2 22  --  19* 18*  --   --  20*  --   --   --  21*  --  21*  GLUCOSE 177*  --  433* 121* 111*  --  83  --   --   --  123*  --  106*  BUN 13  --  25* 37*  --   --  38*  --   --   --  36*  --  39*  CREATININE 1.11*  --  2.40* 3.07*  --   --  3.12*  --   --   --  2.69*  --  3.14*  CALCIUM 7.9*  --  7.7* 7.6*  --   --  6.5*  --   --   --  8.3*  --  8.0*  MG 1.9  --  1.6*  --   --   --  2.1  --   --   --   --   --  2.1  PHOS  --   --   --   --   --   --   --   --   --   --  6.3*  --  6.7*   < > = values in this interval not displayed.   GFR: Estimated Creatinine Clearance: 15.8 mL/min (A) (by C-G formula based on SCr of 3.14 mg/dL (H)). Recent Labs  Lab 05/20/2018 1955 20-May-2018 1956 05/12/18 0006 05/12/18 0415 05/12/18 0825  WBC 9.0  --  3.4* 4.5 4.1  LATICACIDVEN  --  3.7* 3.4* 3.5*  --    Liver Function Tests: Recent Labs  Lab 2018/05/20 0508 May 20, 2018 1613 20-May-2018 1955 05/12/18 0006 05/12/18 0415  AST 91* 140*  250*  --  804*  ALT 60* 97* 281*  --  565*  ALKPHOS 49 45 32*  --  38  BILITOT 0.6 0.7 0.6  --  1.9*  PROT 5.7* 4.8* 3.5*  --  4.4*  ALBUMIN 3.2* 2.7* 1.9* 2.7* 2.7*   Recent Labs  Lab 05/20/18 0508  AMYLASE 166*   No results for input(s): AMMONIA in the last 168 hours. ABG    Component Value Date/Time   PHART 7.345 (L) 05/12/2018 0216   PCO2ART 37.2 05/12/2018 0216   PO2ART 136.0 (H) 05/12/2018 0216   HCO3 20.9 05/12/2018 0216   TCO2 22 05/12/2018 0216   ACIDBASEDEF 5.0 (H) 05/12/2018 0216   O2SAT 99.0 05/12/2018 0216    Coagulation Profile: Recent Labs  Lab 04/26/2018 1207 May 20, 2018 1959 05/12/18 0415  INR 1.1 1.5* 1.4*   Cardiac Enzymes: Recent Labs  Lab 20-May-2018 1810 2018/05/20 2000  TROPONINI <0.03 <0.03   HbA1C: Hgb A1c MFr Bld  Date/Time Value Ref Range Status  05/02/2018 10:28 AM 6.4 (H) 4.8 - 5.6 % Final    Comment:    (NOTE)         Prediabetes: 5.7 - 6.4         Diabetes: >6.4         Glycemic control for adults with diabetes: <7.0   01/23/2018 12:18 PM 6.4 (H) 4.8 - 5.6 % Final  Comment:             Prediabetes: 5.7 - 6.4          Diabetes: >6.4          Glycemic control for adults with diabetes: <7.0    CBG: Recent Labs  Lab 15-May-2018 1212 May 15, 2018 1605 05/12/18 0357 05/12/18 0638 05/12/18 0824  GLUCAP 119* 102* 89 83 80     Critical care time: 40 minutes    Levy Pupa, MD, PhD 05/12/2018, 10:10 AM [facility] [phone] or if no answer [phone]

## 2018-05-12 NOTE — Progress Notes (Signed)
Patient ID: Gwendolyn Pham, female   DOB: [birth year], 63 y.o.   MRN: [id number]  Progress Note    05/12/2018 11:48 AM 1 Day Post-Op  Subjective: Intubated and unresponsive on vent.  Events noted.  Discussed with Dr. Chestine Spore in general surgery.  Patient became more hypotensive and acidotic and was taken back to the operating room where she was found to have extensive necrotic bowel.  This was both large and small bowel.  This was all resected.  Now with open abdomen with plans for return to the operating room tomorrow.   Vitals:   05/12/18 1000 05/12/18 1107  BP: 115/63   Pulse: (!) 127 (!) 120  Resp: (!) 30 (!) 34  Temp:    SpO2: 98% 100%   Physical Exam: Unresponsive on vent.  Open abdomen.  Palpable pedal pulses. Dilated pupils bilaterally  CBC    Component Value Date/Time   WBC 4.7 05/12/2018 1014   RBC 3.73 (L) 05/12/2018 1014   HGB 11.5 (L) 05/12/2018 1014   HGB 15.8 01/23/2018 1218   HCT 33.0 (L) 05/12/2018 1014   HCT 45.9 01/23/2018 1218   PLT 87 (L) 05/12/2018 1014   PLT 253 01/23/2018 1218   MCV 88.5 05/12/2018 1014   MCV 93 01/23/2018 1218   MCH 30.8 05/12/2018 1014   MCHC 34.8 05/12/2018 1014   RDW 17.2 (H) 05/12/2018 1014   RDW 13.7 01/23/2018 1218   LYMPHSABS 1.1 05/06/2018 1802   LYMPHSABS 2.8 01/23/2018 1218   MONOABS 0.3 05/03/2018 1802   EOSABS 0.0 04/23/2018 1802   EOSABS 0.1 01/23/2018 1218   BASOSABS 0.0 04/30/2018 1802   BASOSABS 0.1 01/23/2018 1218    BMET    Component Value Date/Time   NA 141 05/12/2018 0415   NA 137 01/23/2018 1218   K 4.2 05/12/2018 0415   CL 108 05/12/2018 0415   CO2 21 (L) 05/12/2018 0415   GLUCOSE 106 (H) 05/12/2018 0415   BUN 39 (H) 05/12/2018 0415   BUN 11 01/23/2018 1218   CREATININE 3.14 (H) 05/12/2018 0415   CREATININE 0.66 06/18/2015 1855   CALCIUM 8.0 (L) 05/12/2018 0415   GFRNONAA 15 (L) 05/12/2018 0415   GFRNONAA >89 01/31/2014 1532   GFRAA 17 (L) 05/12/2018 0415   GFRAA >89 01/31/2014 1532    INR    Component Value Date/Time   INR 1.4 (H) 05/12/2018 0415     Intake/Output Summary (Last 24 hours) at 05/12/2018 1148 Last data filed at 05/12/2018 1100 Gross per 24 hour  Intake 6418.9 ml  Output 1611 ml  Net 4807.9 ml     Assessment/Plan:  63 y.o. female critically ill with multisystem organ failure.  Appreciate input from all consultants.  Neurology evaluation noted regarding changes in her pupils.  Continue maximal support.  I had a long discussion with patient's sister present regarding the critical nature and risk for mortality associated with her multisystem organ failure.  This likely explanation for this would be diffuse micro emboli during the proximal aortic clamping.  Continue support     Larina Earthly, MD FACS [facility] [phone] 05/12/2018 11:48 AM

## 2018-05-12 NOTE — Transfer of Care (Signed)
Immediate Anesthesia Transfer of Care Note  Patient: Gwendolyn Pham  Procedure(s) Performed: EMERGENCY EXPLORATORY LAPAROTOMY (N/A ) Resection Small Bowel, LEFT AND RIGHT COLON  Patient Location: ICU  Anesthesia Type:General  Level of Consciousness: Patient remains intubated per anesthesia plan  Airway & Oxygen Therapy: Patient remains intubated per anesthesia plan and Patient placed on Ventilator (see vital sign flow sheet for setting)  Post-op Assessment: Report given to RN and Post -op Vital signs reviewed and stable  Post vital signs: Reviewed and stable  Last Vitals:  Vitals Value Taken Time  BP    Temp    Pulse    Resp    SpO2      Last Pain:  Vitals:   04/20/2018 2100  TempSrc: Axillary  PainSc:       Patients Stated Pain Goal: 0 (04/17/2018 1445)  Complications: No apparent anesthesia complications

## 2018-05-12 NOTE — Progress Notes (Signed)
100 mL of fentanyl wasted in stericycle with Josette, RN as second verifier.

## 2018-05-12 NOTE — Progress Notes (Signed)
CCM MD aware of new lab results. Orders to change fluids.

## 2018-05-12 NOTE — Progress Notes (Signed)
Pharmacy Antibiotic Note  Gwendolyn Pham is a 63 y.o. female admitted on 04/28/2018 with r/o peritonitis.  Pharmacy has been consulted for meropenem and vancomycin dosing. WBC has normalzed, SCr has trended up to 3.14 with low urine output.   Plan: -Start meropenem 500 mg IV q 12 hours -Vancomycin 1250 mg IV once, then will dose vancomycin per levels while monitor evolution of renal function -Monitor CBC and clinical progress  Height: 5\' 4"  (162.6 cm) Weight: 136 lb 0.4 oz (61.7 kg) IBW/kg (Calculated) : 54.7  Temp (24hrs), Avg:96.6 F (35.9 C), Min:94.2 F (34.6 C), Max:101.1 F (38.4 C)  Recent Labs  Lab 04/24/2018 0508 05/03/2018 1613 05/03/2018 1802 05/12/2018 1955 05/09/2018 1956 05/12/18 0006 05/12/18 0415 05/12/18 0825  WBC 13.9*  --  10.1 9.0  --  3.4* 4.5 4.1  CREATININE 2.40* 3.07*  --  3.12*  --  2.69* 3.14*  --   LATICACIDVEN  --   --   --   --  3.7* 3.4* 3.5*  --     Estimated Creatinine Clearance: 15.8 mL/min (A) (by C-G formula based on SCr of 3.14 mg/dL (H)).    Allergies  Allergen Reactions  . Lisinopril Cough    Muscle pain   . Penicillins Rash    Did it involve swelling of the face/tongue/throat, SOB, or low BP? No Did it involve sudden or severe rash/hives, skin peeling, or any reaction on the inside of your mouth or nose? No Did you need to seek medical attention at a hospital or doctor's office? No When did it last happen?Longer than 10 years ago If all above answers are "NO", may proceed with cephalosporin use.     Antimicrobials this admission: Vanc 2/28 >>  Meropenem 2/28 >>   Dose adjustments this admission: None   Microbiology results: 2/28 BCx:    Thank you for allowing pharmacy to be a part of this patient's care.  Vinnie Level, PharmD., BCPS Clinical Pharmacist Clinical phone for 05/12/18 until 3:30pm: [phone] If after 3:30pm, please refer to [facility] for unit-specific pharmacist

## 2018-05-12 NOTE — Progress Notes (Addendum)
Initial Nutrition Assessment  DOCUMENTATION CODES:   Not applicable  INTERVENTION:   Recommend initiating TPN.  NUTRITION DIAGNOSIS:   Inadequate oral intake related to inability to eat as evidenced by NPO status.  GOAL:   Provide needs based on ASPEN/SCCM guidelines  MONITOR:   Vent status, I & O's, Labs  REASON FOR ASSESSMENT:   Ventilator   ASSESSMENT:   Pt with PMH T2DM, COPD, HTN, HLD and depression. Presented to [facility] for aortobifemoral bypass procedure on 2/26. Found to have extensive necrotic bowel which was all resected 2/27.    2/26 Pt underwent aortobifemoral bypass for aortoiliac occlusive disease w/ aortic occlusion.  2/27 intubated for respiratory insufficiency 2/27 exploratory laparotomy; found ischemic bowel/colon.   Underwent the bypass procedure w/o complications. On 2/27 pt sat up in bed to use bathroom, shortly became hypotensive. CCM responded and found patient pale, very low UPO and in distress. Patient then intubated.   Brought to OR 2/27 for reexploration by vascular; general surgery consulted for ischemic bowel/colon. Performed small bowel resection and bilateral colectomy. The distal 1/3 of small bowel along with appendix, cecum, right colon and left colon were resected. Abdomen left open and wound vac placed, second laparotomy in 24-48hrs.   A family friend was present but was unable to provide any information regarding diet recall or UBW. Per chart, pt has stayed around 137# over the last year. NFPE revealed no depletions.   Patient is not a candidate for EN at this time. Patient does not appear malnourished, but is at high nutritional risk. Discussed the initiation of TPN with Dr.Jennings who deferred to vascular surgery. Vascular does not want to initiate TPN today. Plan to reasses in 1-2 days. Will continue to monitor.   MAP (cuff): 60s-70s MV: 16.1 L/min Temp (24hrs), Avg:97.4 F (36.3 C), Min:94.2 F (34.6 C), Max:101.1 F (38.4 C)  Net  I/O: +6643, observed OG output was about . Container had not been emptied since new RN shift started. Observed minimal urine output. Negative Pressure Wound Therapy output: this shift, on night shift s/p surgery  Medications reviewed and include: insulin aspart 0-9 units IV: electrolyte-a 134ml/hr, fentanyl, levophed  Labs reviewed: BUN 41 (H), Creatinine 3.49 (H), Corrected Ca 9.2 (WNL), AST 621 (H), ALT 520 (H), CBG (68-102)  NUTRITION - FOCUSED PHYSICAL EXAM:    Most Recent Value  Orbital Region  No depletion  Upper Arm Region  No depletion  Thoracic and Lumbar Region  No depletion  Buccal Region  Unable to assess  Temple Region  Mild depletion  Clavicle Bone Region  No depletion  Clavicle and Acromion Bone Region  No depletion  Scapular Bone Region  No depletion  Dorsal Hand  No depletion  Patellar Region  No depletion  Anterior Thigh Region  No depletion  Posterior Calf Region  No depletion  Edema (RD Assessment)  Mild  Hair  Reviewed  Eyes  Unable to assess  Mouth  Unable to assess  Skin  Reviewed  Nails  Reviewed       Diet Order:   Diet Order            Diet NPO time specified  Diet effective now              EDUCATION NEEDS:   No education needs have been identified at this time  Skin:  Skin Assessment: Skin Integrity Issues: Skin Integrity Issues:: Incisions, Wound VAC Wound Vac: Abdomen Incisions: Abdomen, groin   Last BM:  2/28  Height:   Ht Readings from Last 1 Encounters:  Jun 05, 2018 5\' 4"  (1.626 m)    Weight:   Wt Readings from Last 1 Encounters:  06/05/2018 61.7 kg    Ideal Body Weight:  56.8 kg  BMI:  Body mass index is 23.35 kg/m.  Estimated Nutritional Needs:   Kcal:  1850 kcal  Protein:  125-145 g  Fluid:  >/=1.85L    [employer] Dietetic Intern

## 2018-05-12 NOTE — Progress Notes (Signed)
PT Cancellation Note  Patient Details Name: Gwendolyn Pham MRN: [id number] DOB: 1956/02/17   Cancelled Treatment:    Reason Eval/Treat Not Completed: Medical issues which prohibited therapy. Emergent surgery and now intubated.   [name] [facility] 05/12/2018, 8:47 AM Skip Mayer PT Acute Rehabilitation Services Pager [phone] Office [phone]

## 2018-05-12 NOTE — Progress Notes (Addendum)
1 Day Post-Op    CC:  Hypotension post op  Subjective: Pt sedated on vent, Tachycardic with adequate BP on low dose pressors, Sats are good on the vent.    Objective: Vital signs in last 24 hours: Temp:  [94.2 F (34.6 C)-101.1 F (38.4 C)] 101.1 F (38.4 C) (02/28 0700) Pulse Rate:  [97-128] 122 (02/28 0700) Resp:  [20-50] 20 (02/28 0700) BP: (30-147)/(22-118) 98/58 (02/28 0700) SpO2:  [76 %-100 %] 98 % (02/28 0700) FiO2 (%):  [100 %] 100 % (02/28 0600) Last BM Date: 04/18/2018 5355 IV 386 urine 750 drain Stool x 2 ABG's good on Vent Creatinine up some  1.11>>2.40>>3.12>>3.14 this AM LFT's also rising AST 91>>250>>804 ALT 60>>281>>565 CBC is stable Lactate 3.5 Intake/Output from previous day: 02/27 0701 - 02/28 0700 In: 5355.4 [I.V.:3625.4; Blood:1180; IV Piggyback:550] Out: 1236 [Urine:386; Drains:750; Blood:100] Intake/Output this shift: No intake/output data recorded.  General appearance: sedated on the Vent, both pupils are dialated Resp: clear and good air movement on the Vent Cardio: Sinus tachycardia GI: Wound vac in place, some lower ecchymosis around the groins LE: warm, with  Pulses both feet.  Lab Results:  Recent Labs    05/12/18 0006 05/12/18 0216 05/12/18 0415  WBC 3.4*  --  4.5  HGB 10.7* 10.5* 11.5*  HCT 32.3* 31.0* 34.0*  PLT 85*  --  87*    BMET Recent Labs    05/12/18 0006 05/12/18 0216 05/12/18 0415  NA 139 141 141  K 4.3 4.2 4.2  CL 110  --  108  CO2 21*  --  21*  GLUCOSE 123*  --  106*  BUN 36*  --  39*  CREATININE 2.69*  --  3.14*  CALCIUM 8.3*  --  8.0*   PT/INR Recent Labs    05/01/2018 1959 05/12/18 0415  LABPROT 17.9* 16.8*  INR 1.5* 1.4*    Recent Labs  Lab 04/26/2018 0508 05/07/2018 1613 04/16/2018 1955 05/12/18 0006 05/12/18 0415  AST 91* 140* 250*  --  804*  ALT 60* 97* 281*  --  565*  ALKPHOS 49 45 32*  --  38  BILITOT 0.6 0.7 0.6  --  1.9*  PROT 5.7* 4.8* 3.5*  --  4.4*  ALBUMIN 3.2* 2.7* 1.9* 2.7*  2.7*     Lipase  No results found for: LIPASE   Prior to Admission medications   Medication Sig Start Date End Date Taking? Authorizing Provider  acetaminophen (TYLENOL) 325 MG tablet Take 650 mg by mouth every 6 (six) hours as needed for moderate pain.    Yes [provider]  aspirin EC 81 MG tablet Take 1 tablet (81 mg total) by mouth daily. 04/20/18  Yes Patwardhan, Manish J, MD  atorvastatin (LIPITOR) 80 MG tablet Take 1 tablet (80 mg total) by mouth daily. 04/14/18  Yes Myles Lipps, MD  [facility] SR) 150 MG 12 hr tablet Take 1 tablet (150 mg total) by mouth 2 (two) times daily. 01/23/18  Yes Myles Lipps, MD  Fluticasone-Salmeterol (ADVAIR) 100-50 MCG/DOSE AEPB Inhale 1 puff into the lungs 2 (two) times daily.   Yes [provider]  hydrochlorothiazide (HYDRODIURIL) 25 MG tablet Take 1 tablet (25 mg total) by mouth daily. 01/23/18  Yes Myles Lipps, MD  metFORMIN (GLUCOPHAGE) 500 MG tablet Take 1 tablet (500 mg total) by mouth daily with breakfast. 01/23/18  Yes Myles Lipps, MD  metoprolol tartrate (LOPRESSOR) 25 MG tablet Take 1 tablet (25 mg total)  by mouth 2 (two) times daily. 01/23/18  Yes Myles Lipps, MD  venlafaxine XR (EFFEXOR-XR) 150 MG 24 hr capsule TAKE 1 CAPSULE BY MOUTH EVERY DAY WITH BREAKFAST Patient taking differently: Take 150 mg by mouth daily with breakfast.  02/10/18  Yes Myles Lipps, MD    Medications: . atorvastatin  80 mg Per Tube Daily  . chlorhexidine  15 mL Mouth Rinse BID  . insulin aspart  0-9 Units Subcutaneous Q4H  . mouth rinse  15 mL Mouth Rinse BID  . mouth rinse  15 mL Mouth Rinse q12n4p  . metoprolol tartrate  5 mg Intravenous Q8H  . pantoprazole (PROTONIX) IV  40 mg Intravenous Daily   . calcium gluconate    . electrolyte-A    . fentaNYL infusion INTRAVENOUS 50 mcg/hr (05/12/18 0400)  . norepinephrine (LEVOPHED) Adult infusion 5 mcg/min (05/02/2018 2255)  . sodium chloride       Anti-infectives (From admission, onward)   Start     Dose/Rate Route Frequency Ordered Stop   04/21/2018 1800  vancomycin (VANCOCIN) IVPB 1000 mg/200 mL premix     1,000 mg 200 mL/hr over 60 Minutes Intravenous Every 12 hours 05/07/2018 1357 04/29/2018 1759   04/27/2018 0540  vancomycin (VANCOCIN) IVPB 1000 mg/200 mL premix     1,000 mg 200 mL/hr over 60 Minutes Intravenous 60 min pre-op 04/29/2018 0540 04/28/2018 1504      Assessment/Plan Hypertension Hyperlipidemia Diabetes Anxiety/depression AKI - Creatinine 1.11>>2.40>>3.12>>3.14 this AM LFT's going up  AIOD with aortic occlusion Aortobifemoral bypass wth 14 x 8 Hemashield graft, 05/02/2018, Dr. Arbie Cookey Ischemic distal small bowel, right and left colon ischemia Exploratory laparotomy, Small bowel resection, Right colectomy, left colectomy, abdomen left open, Negative pressure temporary abdomina wound vac placement, 04/22/2018, Dr. Sherald Hess, Dr. Abigail Miyamoto  FEN:  IV fluids/NPO ID: Vancomycin DVT: None  Plan:  Dr. Johna Sheriff & Dr. Arbie Cookey are here seeing pt. We will follow with her and discuss going back to OR tomorrow.   Dr. Arbie Cookey is going to ask Neurology to see this AM.         LOS: 2 days    Chelsie Burel 05/12/2018 [phone]

## 2018-05-12 NOTE — Progress Notes (Signed)
Patient's blood pressure has been on lower side systolic 90's map of upper 50's Elink doctor ordered N/S.

## 2018-05-12 NOTE — Anesthesia Postprocedure Evaluation (Signed)
Anesthesia Post Note  Patient: Gwendolyn Pham  Procedure(s) Performed: EMERGENCY EXPLORATORY LAPAROTOMY (N/A ) Resection Small Bowel, LEFT AND RIGHT COLON     Patient location during evaluation: SICU Anesthesia Type: General Level of consciousness: sedated Pain management: pain level controlled Vital Signs Assessment: post-procedure vital signs reviewed and stable Respiratory status: patient remains intubated per anesthesia plan Cardiovascular status: stable Postop Assessment: no apparent nausea or vomiting Anesthetic complications: no    Last Vitals:  Vitals:   05/12/18 0100 05/12/18 0115  BP: 115/77   Pulse:    Resp: 20 20  Temp:    SpO2:      Last Pain:  Vitals:   04/19/2018 2100  TempSrc: Axillary  PainSc:                  Catheryn Bacon Ellender

## 2018-05-13 ENCOUNTER — Encounter (HOSPITAL_COMMUNITY): Admission: RE | Disposition: E | Payer: Self-pay | Source: Home / Self Care | Attending: Vascular Surgery

## 2018-05-13 ENCOUNTER — Inpatient Hospital Stay (HOSPITAL_COMMUNITY): Payer: Medicare Other | Admitting: Certified Registered"

## 2018-05-13 ENCOUNTER — Inpatient Hospital Stay (HOSPITAL_COMMUNITY): Payer: Medicare Other

## 2018-05-13 HISTORY — PX: COLOSTOMY: SHX63

## 2018-05-13 HISTORY — PX: LAPAROTOMY: SHX154

## 2018-05-13 LAB — RENAL FUNCTION PANEL
Albumin: 2 g/dL — ABNORMAL LOW (ref 3.5–5.0)
Albumin: 1.8 g/dL — ABNORMAL LOW (ref 3.5–5.0)
Anion gap: 12 (ref 5–15)
Anion gap: 13 (ref 5–15)
BUN: 48 mg/dL — ABNORMAL HIGH (ref 8–23)
BUN: 55 mg/dL — ABNORMAL HIGH (ref 8–23)
CALCIUM: 7.4 mg/dL — AB (ref 8.9–10.3)
CHLORIDE: 110 mmol/L (ref 98–111)
CO2: 19 mmol/L — ABNORMAL LOW (ref 22–32)
CO2: 20 mmol/L — ABNORMAL LOW (ref 22–32)
CREATININE: 4.47 mg/dL — AB (ref 0.44–1.00)
Calcium: 7.5 mg/dL — ABNORMAL LOW (ref 8.9–10.3)
Chloride: 109 mmol/L (ref 98–111)
Creatinine, Ser: 4.29 mg/dL — ABNORMAL HIGH (ref 0.44–1.00)
GFR calc Af Amer: 11 mL/min — ABNORMAL LOW (ref >60)
GFR, EST AFRICAN AMERICAN: 12 mL/min — AB (ref >60)
GFR, EST NON AFRICAN AMERICAN: 10 mL/min — AB (ref >60)
GFR, EST NON AFRICAN AMERICAN: 10 mL/min — AB (ref >60)
Glucose, Bld: 94 mg/dL (ref 70–99)
Glucose, Bld: 114 mg/dL — ABNORMAL HIGH (ref 70–99)
Phosphorus: 7.5 mg/dL — ABNORMAL HIGH (ref 2.5–4.6)
Phosphorus: 9.2 mg/dL — ABNORMAL HIGH (ref 2.5–4.6)
Potassium: 5.7 mmol/L — ABNORMAL HIGH (ref 3.5–5.1)
Potassium: 5.6 mmol/L — ABNORMAL HIGH (ref 3.5–5.1)
Sodium: 140 mmol/L (ref 135–145)
Sodium: 143 mmol/L (ref 135–145)

## 2018-05-13 LAB — POCT I-STAT 7, (LYTES, BLD GAS, ICA,H+H)
Acid-base deficit: 5 mmol/L — ABNORMAL HIGH (ref 0.0–2.0)
Bicarbonate: 21.2 mmol/L (ref 20.0–28.0)
CALCIUM ION: 1.02 mmol/L — AB (ref 1.15–1.40)
HEMATOCRIT: 25 % — AB (ref 36.0–46.0)
Hemoglobin: 8.5 g/dL — ABNORMAL LOW (ref 12.0–15.0)
O2 Saturation: 100 %
Potassium: 5.3 mmol/L — ABNORMAL HIGH (ref 3.5–5.1)
Sodium: 140 mmol/L (ref 135–145)
TCO2: 22 mmol/L (ref 22–32)
pCO2 arterial: 43.1 mmHg (ref 32.0–48.0)
pH, Arterial: 7.299 — ABNORMAL LOW (ref 7.350–7.450)
pO2, Arterial: 193 mmHg — ABNORMAL HIGH (ref 83.0–108.0)

## 2018-05-13 LAB — BASIC METABOLIC PANEL
Anion gap: 12 (ref 5–15)
BUN: 51 mg/dL — ABNORMAL HIGH (ref 8–23)
CALCIUM: 7.6 mg/dL — AB (ref 8.9–10.3)
CO2: 20 mmol/L — ABNORMAL LOW (ref 22–32)
CREATININE: 4.42 mg/dL — AB (ref 0.44–1.00)
Chloride: 110 mmol/L (ref 98–111)
GFR calc non Af Amer: 10 mL/min — ABNORMAL LOW (ref >60)
GFR, EST AFRICAN AMERICAN: 12 mL/min — AB (ref >60)
Glucose, Bld: 85 mg/dL (ref 70–99)
Potassium: 5.5 mmol/L — ABNORMAL HIGH (ref 3.5–5.1)
Sodium: 142 mmol/L (ref 135–145)

## 2018-05-13 LAB — CBC
HCT: 31.1 % — ABNORMAL LOW (ref 36.0–46.0)
Hemoglobin: 10.6 g/dL — ABNORMAL LOW (ref 12.0–15.0)
MCH: 30.3 pg (ref 26.0–34.0)
MCHC: 34.1 g/dL (ref 30.0–36.0)
MCV: 88.9 fL (ref 80.0–100.0)
Platelets: 76 10*3/uL — ABNORMAL LOW (ref 150–400)
RBC: 3.5 MIL/uL — ABNORMAL LOW (ref 3.87–5.11)
RDW: 17.1 % — ABNORMAL HIGH (ref 11.5–15.5)
WBC: 9.5 10*3/uL (ref 4.0–10.5)
nRBC: 0 % (ref 0.0–0.2)

## 2018-05-13 LAB — GLUCOSE, CAPILLARY
GLUCOSE-CAPILLARY: 68 mg/dL — AB (ref 70–99)
Glucose-Capillary: 80 mg/dL (ref 70–99)
Glucose-Capillary: 88 mg/dL (ref 70–99)
Glucose-Capillary: 74 mg/dL (ref 70–99)
Glucose-Capillary: 75 mg/dL (ref 70–99)
Glucose-Capillary: 64 mg/dL — ABNORMAL LOW (ref 70–99)
Glucose-Capillary: 96 mg/dL (ref 70–99)
Glucose-Capillary: 97 mg/dL (ref 70–99)
Glucose-Capillary: 85 mg/dL (ref 70–99)
Glucose-Capillary: 72 mg/dL (ref 70–99)

## 2018-05-13 LAB — LACTIC ACID, PLASMA: Lactic Acid, Venous: 2.3 mmol/L (ref 0.5–1.9)

## 2018-05-13 SURGERY — LAPAROTOMY, EXPLORATORY
Anesthesia: General | Site: Abdomen

## 2018-05-13 MED ORDER — ROCURONIUM BROMIDE 100 MG/10ML IV SOLN
INTRAVENOUS | Status: DC | PRN
  Administered 2018-05-13: 50 mg via INTRAVENOUS

## 2018-05-13 MED ORDER — PRISMASOL BGK 4/2.5 32-4-2.5 MEQ/L REPLACEMENT SOLN
Status: DC
  Administered 2018-05-13 – 2018-05-18 (×16): via INTRAVENOUS_CENTRAL
  Filled 2018-05-13 (×19): qty 5000

## 2018-05-13 MED ORDER — HEPARIN SODIUM (PORCINE) 1000 UNIT/ML DIALYSIS
1000.0000 [IU] | INTRAMUSCULAR | Status: DC | PRN
  Administered 2018-05-13: 2400 [IU] via INTRAVENOUS_CENTRAL
  Administered 2018-05-18: 3000 [IU] via INTRAVENOUS_CENTRAL
  Filled 2018-05-13 (×4): qty 6

## 2018-05-13 MED ORDER — DEXTROSE 50 % IV SOLN
INTRAVENOUS | Status: AC
  Administered 2018-05-13: 25 mL
  Filled 2018-05-13: qty 50

## 2018-05-13 MED ORDER — SODIUM BICARBONATE 8.4 % IV SOLN
INTRAVENOUS | Status: AC
  Filled 2018-05-13: qty 50

## 2018-05-13 MED ORDER — HEPARIN (PORCINE) 2000 UNITS/L FOR CRRT
INTRAVENOUS_CENTRAL | Status: DC | PRN
  Administered 2018-05-13: 20:00:00 via INTRAVENOUS_CENTRAL
  Filled 2018-05-13 (×2): qty 1000

## 2018-05-13 MED ORDER — MIDAZOLAM HCL 2 MG/2ML IJ SOLN
INTRAMUSCULAR | Status: AC
  Filled 2018-05-13: qty 2

## 2018-05-13 MED ORDER — MIDAZOLAM HCL 2 MG/2ML IJ SOLN
INTRAMUSCULAR | Status: AC
  Filled 2018-05-13: qty 4

## 2018-05-13 MED ORDER — MIDAZOLAM HCL 2 MG/2ML IJ SOLN
INTRAMUSCULAR | Status: DC | PRN
  Administered 2018-05-13: 2 mg via INTRAVENOUS
  Administered 2018-05-13: 4 mg via INTRAVENOUS

## 2018-05-13 MED ORDER — 0.9 % SODIUM CHLORIDE (POUR BTL) OPTIME
TOPICAL | Status: DC | PRN
  Administered 2018-05-13: 4000 mL

## 2018-05-13 MED ORDER — PROPOFOL 10 MG/ML IV BOLUS
INTRAVENOUS | Status: AC
  Filled 2018-05-13: qty 20

## 2018-05-13 MED ORDER — SODIUM CHLORIDE 0.9 % IV SOLN
1.0000 g | Freq: Two times a day (BID) | INTRAVENOUS | Status: DC
  Administered 2018-05-13 – 2018-05-18 (×10): 1 g via INTRAVENOUS
  Filled 2018-05-13 (×10): qty 1

## 2018-05-13 MED ORDER — FENTANYL CITRATE (PF) 250 MCG/5ML IJ SOLN
INTRAMUSCULAR | Status: DC | PRN
  Administered 2018-05-13 (×5): 50 ug via INTRAVENOUS

## 2018-05-13 MED ORDER — FENTANYL CITRATE (PF) 250 MCG/5ML IJ SOLN
INTRAMUSCULAR | Status: AC
  Filled 2018-05-13: qty 5

## 2018-05-13 MED ORDER — SODIUM BICARBONATE 8.4 % IV SOLN
INTRAVENOUS | Status: DC | PRN
  Administered 2018-05-13: 50 meq via INTRAVENOUS

## 2018-05-13 MED ORDER — PRISMASOL BGK 0/2.5 32-2.5 MEQ/L REPLACEMENT SOLN
Status: DC
  Administered 2018-05-13 – 2018-05-16 (×3): via INTRAVENOUS_CENTRAL
  Filled 2018-05-13 (×7): qty 5000

## 2018-05-13 MED ORDER — PRISMASOL BGK 4/2.5 32-4-2.5 MEQ/L IV SOLN
INTRAVENOUS | Status: DC
  Administered 2018-05-13 – 2018-05-18 (×26): via INTRAVENOUS_CENTRAL
  Filled 2018-05-13 (×37): qty 5000

## 2018-05-13 SURGICAL SUPPLY — 47 items
BLADE CLIPPER SURG (BLADE) IMPLANT
BNDG GAUZE ELAST 4 BULKY (GAUZE/BANDAGES/DRESSINGS) ×2 IMPLANT
CANISTER SUCT 3000ML PPV (MISCELLANEOUS) ×4 IMPLANT
CHLORAPREP W/TINT 26ML (MISCELLANEOUS) ×4 IMPLANT
COVER SURGICAL LIGHT HANDLE (MISCELLANEOUS) ×4 IMPLANT
COVER WAND RF STERILE (DRAPES) ×4 IMPLANT
DRAPE LAPAROSCOPIC ABDOMINAL (DRAPES) ×4 IMPLANT
DRAPE WARM FLUID 44X44 (DRAPE) ×4 IMPLANT
DRSG OPSITE POSTOP 4X10 (GAUZE/BANDAGES/DRESSINGS) IMPLANT
DRSG OPSITE POSTOP 4X8 (GAUZE/BANDAGES/DRESSINGS) IMPLANT
ELECT BLADE 6.5 EXT (BLADE) IMPLANT
ELECT CAUTERY BLADE 6.4 (BLADE) ×4 IMPLANT
ELECT REM PT RETURN 9FT ADLT (ELECTROSURGICAL) ×4
ELECTRODE REM PT RTRN 9FT ADLT (ELECTROSURGICAL) ×2 IMPLANT
GLOVE BIO SURGEON STRL SZ8 (GLOVE) ×4 IMPLANT
GLOVE BIOGEL PI IND STRL 8 (GLOVE) ×2 IMPLANT
GLOVE BIOGEL PI INDICATOR 8 (GLOVE) ×2
GOWN STRL REUS W/ TWL LRG LVL3 (GOWN DISPOSABLE) ×2 IMPLANT
GOWN STRL REUS W/ TWL XL LVL3 (GOWN DISPOSABLE) ×2 IMPLANT
GOWN STRL REUS W/TWL LRG LVL3 (GOWN DISPOSABLE) ×4
GOWN STRL REUS W/TWL XL LVL3 (GOWN DISPOSABLE) ×4
KIT BASIN OR (CUSTOM PROCEDURE TRAY) ×4 IMPLANT
KIT OSTOMY DRAINABLE 2.75 STR (WOUND CARE) ×2 IMPLANT
KIT TURNOVER KIT B (KITS) ×4 IMPLANT
LIGASURE IMPACT 36 18CM CVD LR (INSTRUMENTS) ×2 IMPLANT
NS IRRIG 1000ML POUR BTL (IV SOLUTION) ×8 IMPLANT
PACK GENERAL/GYN (CUSTOM PROCEDURE TRAY) ×4 IMPLANT
PAD ABD 8X10 STRL (GAUZE/BANDAGES/DRESSINGS) ×2 IMPLANT
PAD ARMBOARD 7.5X6 YLW CONV (MISCELLANEOUS) ×4 IMPLANT
PENCIL SMOKE EVACUATOR (MISCELLANEOUS) ×4 IMPLANT
RELOAD PROXIMATE 75MM BLUE (ENDOMECHANICALS) ×4 IMPLANT
RELOAD STAPLE 75 3.8 BLU REG (ENDOMECHANICALS) IMPLANT
SPECIMEN JAR LARGE (MISCELLANEOUS) IMPLANT
SPONGE LAP 18X18 RF (DISPOSABLE) IMPLANT
STAPLER GUN LINEAR PROX 60 (STAPLE) ×2 IMPLANT
STAPLER PROXIMATE 75MM BLUE (STAPLE) ×2 IMPLANT
STAPLER VISISTAT 35W (STAPLE) ×4 IMPLANT
SUCTION POOLE TIP (SUCTIONS) ×4 IMPLANT
SUT PDS AB 1 TP1 96 (SUTURE) ×8 IMPLANT
SUT SILK 2 0 SH CR/8 (SUTURE) ×4 IMPLANT
SUT SILK 2 0 TIES 10X30 (SUTURE) ×4 IMPLANT
SUT SILK 3 0 SH CR/8 (SUTURE) ×4 IMPLANT
SUT SILK 3 0 TIES 10X30 (SUTURE) ×4 IMPLANT
SUT VIC AB 3-0 SH 8-18 (SUTURE) ×2 IMPLANT
TOWEL OR 17X26 10 PK STRL BLUE (TOWEL DISPOSABLE) ×4 IMPLANT
TRAY FOLEY MTR SLVR 16FR STAT (SET/KITS/TRAYS/PACK) IMPLANT
YANKAUER SUCT BULB TIP NO VENT (SUCTIONS) IMPLANT

## 2018-05-13 NOTE — Progress Notes (Signed)
Hypoglycemic Event  CBG: 68  Treatment: D50 25 mL (12.5 gm)  Symptoms: None  Follow-up CBG: Time: 1642 CBG Result: 97  Possible Reasons for Event: Inadequate meal intake   RN was assisting with another emergent situation on unit when follow up blood glucose was due.     Jeannie Fend

## 2018-05-13 NOTE — Procedures (Signed)
Central Venous Catheter Insertion Procedure Note JARILYN HOLIAN [id number] 01-04-1956  Procedure: Dialysis Catheter Indications: To initiate HD  Procedure Details Consent: Risks of procedure as well as the alternatives and risks of each were explained to the (patient/caregiver).  Consent for procedure obtained. Time Out: Verified patient identification, verified procedure, site/side was marked, verified correct patient position, special equipment/implants available, medications/allergies/relevent history reviewed, required imaging and test results available.  Performed  Maximum sterile technique was used including antiseptics, cap, gloves, gown, hand hygiene, mask and sheet. Skin prep: Chlorhexidine; local anesthetic administered A antimicrobial bonded/coated triple lumen catheter was placed in the left internal jugular vein using the Seldinger technique.  Evaluation Blood flow good Complications: No apparent complications Patient did tolerate procedure well. Chest X-ray ordered to verify placement.  CXR: pending.  Les Pou Orphia Mctigue 04/26/2018, 6:52 PM

## 2018-05-13 NOTE — Progress Notes (Signed)
2 Days Post-Op   Subjective/Chief Complaint: On vent Off pressors overnight   Objective: Vital signs in last 24 hours: Temp:  [97.7 F (36.5 C)-99.9 F (37.7 C)] 98.1 F (36.7 C) (02/29 0700) Pulse Rate:  [34-138] 125 (02/29 0700) Resp:  [19-42] 29 (02/29 0700) BP: (78-142)/(50-108) 119/74 (02/29 0700) SpO2:  [90 %-100 %] 92 % (02/29 0700) Arterial Line BP: (96-158)/(52-78) 131/64 (02/29 0700) FiO2 (%):  [40 %-80 %] 40 % (02/29 0323) Last BM Date: 05/12/18  Intake/Output from previous day: 02/28 0701 - 02/29 0700 In: 2529.8 [I.V.:1336.6; IV Piggyback:1158.2] Out: 3280 [Urine:330; Emesis/NG output:1200; Drains:1750] Intake/Output this shift: No intake/output data recorded.  General appearance: no distress Resp: clear to auscultation bilaterally Cardio: regular rate and rhythm GI: open abdomen VAC in place Extremities: scattered contusions  Lab Results:  Recent Labs    05/12/18 1652 04/28/2018 0507  WBC 6.8 9.5  HGB 10.9* 10.6*  HCT 31.8* 31.1*  PLT 80* 76*   BMET Recent Labs    05/12/18 1652 04/25/2018 0507  NA 140 140  K 5.3* 5.7*  CL 110 109  CO2 19* 19*  GLUCOSE 91 94  BUN 42* 48*  CREATININE 3.66* 4.29*  CALCIUM 7.5* 7.4*   PT/INR Recent Labs    04/27/2018 1959 05/12/18 0415  LABPROT 17.9* 16.8*  INR 1.5* 1.4*   ABG Recent Labs    04/27/2018 2308 05/12/18 0216  PHART 7.399 7.345*  HCO3 20.8 20.9    Studies/Results: Ct Head Wo Contrast  Result Date: 05/12/2018 CLINICAL DATA:  63 year old female status post bilateral femoral bypass graft with hypotension. Being evaluated for possible abnormal pupils on exam. EXAM: CT HEAD WITHOUT CONTRAST TECHNIQUE: Contiguous axial images were obtained from the base of the skull through the vertex without intravenous contrast. COMPARISON:  Head CT 04/03/2012. FINDINGS: Brain: Chronic bilateral cerebral white matter hypodensity most pronounced along the frontal horns, and involving the anterior deep white matter  capsules. There is a small area of cortical hypodensity in the right middle frontal gyrus (series 3, image 22) some of which seems to have been present in 2014 (series 2 image 22 at that time). There is no associated mass effect. No acute intracranial hemorrhage identified. No midline shift, mass effect, or evidence of intracranial mass lesion. No ventriculomegaly. No other cerebral cortical abnormality identified. Vascular: Calcified atherosclerosis at the skull base. No suspicious intracranial vascular hyperdensity. Skull: No acute osseous abnormality identified. Sinuses/Orbits: Intubated. Mild to moderate paranasal sinus mucosal thickening with scattered bubbly opacity. The tympanic cavities and mastoids remain well pneumatized. Other: Negative orbit and scalp soft tissues. IMPRESSION: 1. Evidence of chronically advanced cerebral white matter disease. Probably chronic cortical encephalomalacia in the right middle frontal gyrus, some of which was evident on a 2014 comparison. No definite acute intracranial abnormality. 2. Study discussed by telephone with Neurology Dr. Wilford Corner on 05/12/2018 at 0923 hours. Electronically Signed   By: Odessa Fleming M.D.   On: 05/12/2018 09:34   US Renal  Result Date: 05/12/2018 CLINICAL DATA:  Acute renal injury. EXAM: RENAL / URINARY TRACT ULTRASOUND COMPLETE COMPARISON:  KUB 25-May-2018. FINDINGS: Right Kidney: Renal measurements: 9.6 x 4.5 x 5.5 cm = volume: 124.1 mL . Echogenicity within normal limits. No mass or hydronephrosis visualized. Lower pole not visualized due to overlying bowel gas. Left Kidney: Renal measurements: 9.0 x 4.8 x 4.1 cm = volume: 92.5 mL. Mild focal left renal thinning noted suggesting scarring. Echogenicity within normal limits. No mass or hydronephrosis visualized. Bladder: Foley catheter noted  in the bladder.  Tiny left pleural effusion. IMPRESSION: 1 mild focal left renal thinning noted suggesting scarring. No acute abnormality identified. No  hydronephrosis. Foley noted in the bladder. No bladder distention. Electronically Signed   By: Maisie Fus  Register   On: 05/12/2018 12:21   Portable Chest Xray  Result Date: 05/12/2018 CLINICAL DATA:  63 year old female status post exploratory laparotomy for ischemic bowel and vascular surgery, aortic bypass. EXAM: PORTABLE CHEST 1 VIEW COMPARISON:  04/18/2018 and earlier. FINDINGS: Portable AP semi upright view at 0543 hours. Endotracheal tube tip in good position between the clavicles and carina. Enteric tube courses to the left abdomen, tip not included. Stable right IJ introducer sheath. Larger lung volumes. Right greater than left veiling lung base opacity. Mediastinal contours are within normal limits. No pneumothorax. Decreased pulmonary vascularity and interstitial opacity. Linear platelike opacity in the mid and lower lungs. Paucity of bowel gas in the upper abdomen. No acute osseous abnormality identified. IMPRESSION: 1. ETT tip in good position.  Otherwise stable lines and tubes. 2. Right greater than left pleural effusions suspected with atelectasis. 3. Decreased pulmonary vascularity/interstitial edema. Electronically Signed   By: Odessa Fleming M.D.   On: 05/12/2018 09:36   Dg Chest Port 1 View  Result Date: 04/20/2018 CLINICAL DATA:  Postop vascular surgery.  Intubated. EXAM: PORTABLE CHEST 1 VIEW COMPARISON:  None. FINDINGS: Endotracheal tube tip is 1 cm above the carina. NG tube coils in the stomach. Bilateral pleural effusions with bibasilar atelectasis. Diffuse interstitial prominence throughout the lungs vascular congestion. Findings concerning for early interstitial edema. IMPRESSION: Endotracheal tube 1 cm above the carina. Layering bilateral effusions with bibasilar atelectasis. Diffuse interstitial prominence, likely interstitial edema. Electronically Signed   By: Charlett Nose M.D.   On: 05/12/2018 20:11    Anti-infectives: Anti-infectives (From admission, onward)   Start     Dose/Rate  Route Frequency Ordered Stop   05/12/18 1115  vancomycin (VANCOCIN) 1,250 mg in sodium chloride 0.9 % 250 mL IVPB     1,250 mg 166.7 mL/hr over 90 Minutes Intravenous  Once 05/12/18 1109 05/12/18 1348   05/12/18 1115  meropenem (MERREM) 500 mg in sodium chloride 0.9 % 100 mL IVPB     500 mg 200 mL/hr over 30 Minutes Intravenous Every 12 hours 05/12/18 1109     05/12/18 1109  vancomycin variable dose per unstable renal function (pharmacist dosing)      Does not apply See admin instructions 05/12/18 1109     04/23/2018 1800  vancomycin (VANCOCIN) IVPB 1000 mg/200 mL premix     1,000 mg 200 mL/hr over 60 Minutes Intravenous Every 12 hours 04/29/2018 1357 04/26/2018 1759   05/02/2018 0540  vancomycin (VANCOCIN) IVPB 1000 mg/200 mL premix     1,000 mg 200 mL/hr over 60 Minutes Intravenous 60 min pre-op 05/01/2018 0540 05/02/2018 1504      Assessment/Plan: AIOD with aortic occlusion Aortobifemoral bypass wth 14 x 8 Hemashield graft, 05/05/2018, Dr. Arbie Cookey Ischemic distal small bowel, right and left colon ischemia Exploratory laparotomy, Small bowel resection, Right colectomy, left colectomy, abdomen left open, Negative pressure temporary abdominal wound vac placement, 04/27/2018, Dr. Sherald Hess, Dr. Abigail Miyamoto  Plan: return to OR this AM for exploratory laparotomy, possible bowel resection, possible ostomy. Her AKI has worsened but she is off pressors. I called her sister, Hansel Starling, and discussed the procedure, risks, and benefits. She agrees.  LOS: 3 days    Liz Malady 04/17/2018

## 2018-05-13 NOTE — Anesthesia Procedure Notes (Addendum)
Date/Time: 04/30/2018 9:15 AM Performed by: Rosiland Oz, CRNA Pre-anesthesia Checklist: Patient identified, Emergency Drugs available, Suction available, Patient being monitored and Timeout performed Patient Re-evaluated:Patient Re-evaluated prior to induction Oxygen Delivery Method: Circle system utilized Preoxygenation: Pre-oxygenation with 100% oxygen Induction Type: Inhalational induction Comments: Indwelling ETT, +BBS, +ETCO2

## 2018-05-13 NOTE — Progress Notes (Signed)
  Progress Note    04/17/2018 10:02 AM Day of Surgery  Subjective:  Intubated and sedated  Vitals:   04/27/2018 0800 05/05/2018 0900  BP: 121/64 (!) 102/58  Pulse: (!) 131 (!) 126  Resp: (!) 30 (!) 25  Temp: 99.1 F (37.3 C) 99.3 F (37.4 C)  SpO2: 96% 94%    Physical Exam: Pupils are reactive to light Abdomen is soft with wound vac Palpable right dp/pt and left pt   CBC    Component Value Date/Time   WBC 9.5 05/05/2018 0507   RBC 3.50 (L) 04/22/2018 0507   HGB 10.6 (L) 04/30/2018 0507   HGB 15.8 01/23/2018 1218   HCT 31.1 (L) 05/12/2018 0507   HCT 45.9 01/23/2018 1218   PLT 76 (L) 05/03/2018 0507   PLT 253 01/23/2018 1218   MCV 88.9 04/28/2018 0507   MCV 93 01/23/2018 1218   MCH 30.3 04/23/2018 0507   MCHC 34.1 05/06/2018 0507   RDW 17.1 (H) 04/24/2018 0507   RDW 13.7 01/23/2018 1218   LYMPHSABS 1.1 06/08/2018 1802   LYMPHSABS 2.8 01/23/2018 1218   MONOABS 0.3 2018-06-08 1802   EOSABS 0.0 Jun 08, 2018 1802   EOSABS 0.1 01/23/2018 1218   BASOSABS 0.0 06/08/2018 1802   BASOSABS 0.1 01/23/2018 1218    BMET    Component Value Date/Time   NA 140 04/17/2018 0507   NA 137 01/23/2018 1218   K 5.7 (H) 04/25/2018 0507   CL 109 05/01/2018 0507   CO2 19 (L) 05/02/2018 0507   GLUCOSE 94 04/24/2018 0507   BUN 48 (H) 04/19/2018 0507   BUN 11 01/23/2018 1218   CREATININE 4.29 (H) 04/19/2018 0507   CREATININE 0.66 06/18/2015 1855   CALCIUM 7.4 (L) 05/07/2018 0507   GFRNONAA 10 (L) 04/30/2018 0507   GFRNONAA >89 01/31/2014 1532   GFRAA 12 (L) 05/04/2018 0507   GFRAA >89 01/31/2014 1532    INR    Component Value Date/Time   INR 1.4 (H) 05/12/2018 0415     Intake/Output Summary (Last 24 hours) at 04/22/2018 1002 Last data filed at 04/19/2018 0900 Gross per 24 hour  Intake 1482.17 ml  Output 2730 ml  Net -1247.83 ml     Assessment:  63 y.o. female is s/p AoBF for occlusive disease. Colon resection for necrotic colon post op.  Plan: OR today with general  surgery  Neuro: remains sedated CV: no pressors Pulm: intubated, critical care management appreciated GI: in discontinuity, general surgery evaluation today FEN: Cr rising with minimal uop, possibly will need hd Heme/ID: h/h stable, on meropenem Dispo: OR then return to ICU with further planning pending operative intervention. No family is at bedside this morning for update.    Hall Birchard C. Randie Heinz, MD [facility] Office: [phone] Pager: [phone]  04/15/2018 10:02 AM

## 2018-05-13 NOTE — Consult Note (Signed)
Reason for Consult: ARF Referring Physician:  Lamonte Sakai, MD  Gwendolyn Pham is an 63 y.o. female.  HPI: Gwendolyn Pham has a PMH significant for HTN, DM, COPD, depression/anxiety, and PVD who was admitted on 04/26/2018 for elevated aortobifemoral bypass graft surgery due to severe PAD with claudication.   She developed hypotension and respiratory distress post op and was intubated and transferred to the ICU.  She was started on pressors and maintained on the vent.  She then developed ischemic bowel and was emergent brought to the OR for reexploration.   She was found to have extensive necrotic bowel involving the distal small bowel, right and left colon.  She underwent small bowel resection and right and left colectomy and left with an open abdomen but then brought back to the OR earlier today for resection and closure and colostomy was placed.  Her postoperative course was further complicated by oliguric ARF with marked increase in her BUN/Cr and potassium.  We have been consulted to further evaluate and manage her ARF.  The trend in Scr is seen below.  Her K was 5.5 earlier today and CO2 was 20.  She is now back from the OR and is now on levophed and antibiotics including vancomycin.    Trend in Creatinine:  Creatinine, Ser  Date/Time Value Ref Range Status  04/23/2018 11:00 AM 4.42 (H) 0.44 - 1.00 mg/dL Final  05/01/2018 05:07 AM 4.29 (H) 0.44 - 1.00 mg/dL Final  05/12/2018 04:52 PM 3.66 (H) 0.44 - 1.00 mg/dL Final  05/12/2018 10:14 AM 3.49 (H) 0.44 - 1.00 mg/dL Final  05/12/2018 04:15 AM 3.14 (H) 0.44 - 1.00 mg/dL Final  05/12/2018 12:06 AM 2.69 (H) 0.44 - 1.00 mg/dL Final  04/27/2018 07:55 PM 3.12 (H) 0.44 - 1.00 mg/dL Final  04/20/2018 04:13 PM 3.07 (H) 0.44 - 1.00 mg/dL Final  05/09/2018 05:08 AM 2.40 (H) 0.44 - 1.00 mg/dL Final  04/19/2018 12:07 PM 1.11 (H) 0.44 - 1.00 mg/dL Final  05/02/2018 10:27 AM 0.75 0.44 - 1.00 mg/dL Final  01/23/2018 12:18 PM 0.78 0.57 - 1.00 mg/dL Final  08/03/2017 11:43 AM  0.61 0.57 - 1.00 mg/dL Final  01/31/2017 12:21 PM 0.74 0.57 - 1.00 mg/dL Final  06/04/2016 06:28 PM 0.62 0.57 - 1.00 mg/dL Final  04/03/2012 10:29 PM 0.71 0.50 - 1.10 mg/dL Final    PMH:   Past Medical History:  Diagnosis Date  . Anxiety   . Asthma   . COPD (chronic obstructive pulmonary disease) ([place])   . Depression   . Diabetes mellitus without complication ([place])   . Hyperlipidemia   . Hypertension     PSH:   Past Surgical History:  Procedure Laterality Date  . AORTA - BILATERAL FEMORAL ARTERY BYPASS GRAFT N/A 05/12/2018   Procedure: AORTA BIFEMORAL BYPASS GRAFT;  Surgeon: Rosetta Posner, MD;  Location: [facility] OR;  Service: Vascular;  Laterality: N/A;  . BOWEL RESECTION  05/08/2018   Procedure: Resection Small Bowel, LEFT AND RIGHT COLON;  Surgeon: Coralie Keens, MD;  Location: [facility];  Service: General;;  . DILATION AND CURETTAGE OF UTERUS    . INNER EAR SURGERY    . LAPAROTOMY N/A 04/28/2018   Procedure: EMERGENCY EXPLORATORY LAPAROTOMY;  Surgeon: Marty Heck, MD;  Location: [facility];  Service: Vascular;  Laterality: N/A;    Allergies:  Allergies  Allergen Reactions  . Lisinopril Cough    Muscle pain   . Penicillins Rash    Did it involve swelling of the face/tongue/throat, SOB,  or low BP? No Did it involve sudden or severe rash/hives, skin peeling, or any reaction on the inside of your mouth or nose? No Did you need to seek medical attention at a hospital or doctor's office? No When did it last happen?Longer than 10 years ago If all above answers are "NO", may proceed with cephalosporin use.     Medications:   Prior to Admission medications   Medication Sig Start Date End Date Taking? Authorizing Provider  acetaminophen (TYLENOL) 325 MG tablet Take 650 mg by mouth every 6 (six) hours as needed for moderate pain.    Yes [provider]  aspirin EC 81 MG tablet Take 1 tablet (81 mg total) by mouth daily. 04/20/18  Yes Patwardhan, Manish J, MD   atorvastatin (LIPITOR) 80 MG tablet Take 1 tablet (80 mg total) by mouth daily. 04/14/18  Yes Rutherford Guys, MD  buPROPion [facility] SR) 150 MG 12 hr tablet Take 1 tablet (150 mg total) by mouth 2 (two) times daily. 01/23/18  Yes Rutherford Guys, MD  Fluticasone-Salmeterol (ADVAIR) 100-50 MCG/DOSE AEPB Inhale 1 puff into the lungs 2 (two) times daily.   Yes [provider]  hydrochlorothiazide (HYDRODIURIL) 25 MG tablet Take 1 tablet (25 mg total) by mouth daily. 01/23/18  Yes Rutherford Guys, MD  metFORMIN (GLUCOPHAGE) 500 MG tablet Take 1 tablet (500 mg total) by mouth daily with breakfast. 01/23/18  Yes Rutherford Guys, MD  metoprolol tartrate (LOPRESSOR) 25 MG tablet Take 1 tablet (25 mg total) by mouth 2 (two) times daily. 01/23/18  Yes Rutherford Guys, MD  venlafaxine XR (EFFEXOR-XR) 150 MG 24 hr capsule TAKE 1 CAPSULE BY MOUTH EVERY DAY WITH BREAKFAST Patient taking differently: Take 150 mg by mouth daily with breakfast.  02/10/18  Yes Rutherford Guys, MD    Inpatient medications: . atorvastatin  80 mg Per Tube Daily  . chlorhexidine gluconate (MEDLINE KIT)  15 mL Mouth Rinse BID  . insulin aspart  0-9 Units Subcutaneous Q4H  . mouth rinse  15 mL Mouth Rinse 10 times per day  . metoprolol tartrate  5 mg Intravenous Q8H  . pantoprazole (PROTONIX) IV  40 mg Intravenous Daily  . vancomycin variable dose per unstable renal function (pharmacist dosing)   Does not apply See admin instructions    Discontinued Meds:   Medications Discontinued During This Encounter  Medication Reason  . heparin 6,000 Units in sodium chloride 0.9 % 500 mL irrigation Patient Discharge  . 0.9 % irrigation (POUR BTL) Patient Discharge  . chlorhexidine (HIBICLENS) 4 % liquid 4 application   . chlorhexidine (HIBICLENS) 4 % liquid 4 application   . 0.9 %  sodium chloride infusion   . lactated ringers infusion Patient Transfer  . HYDROmorphone (DILAUDID) injection 0.25-0.5 mg Patient Transfer   . meperidine (DEMEROL) injection 6.25-12.5 mg Patient Transfer  . metoCLOPramide (REGLAN) injection 10 mg Patient Transfer  . HYDROmorphone (DILAUDID) injection 0.5-1 mg   . dextrose 5 % and 0.45 % NaCl with KCl 20 mEq/L infusion   . metFORMIN (GLUCOPHAGE) tablet 500 mg Lab parameters not met  . enoxaparin (LOVENOX) injection 40 mg   . insulin aspart (novoLOG) injection 0-9 Units   . metoprolol tartrate (LOPRESSOR) injection 2-5 mg   . HYDROmorphone (DILAUDID) injection 1 mg   . haloperidol lactate (HALDOL) injection 2 mg   . 0.9 %  sodium chloride infusion   . hydrochlorothiazide (HYDRODIURIL) tablet 25 mg   . venlafaxine XR (EFFEXOR-XR) 24  hr capsule 150 mg   . metoprolol tartrate (LOPRESSOR) tablet 25 mg   . pantoprazole (PROTONIX) EC tablet 40 mg   . guaiFENesin-dextromethorphan (ROBITUSSIN DM) 100-10 MG/5ML syrup 15 mL   . aspirin EC tablet 81 mg   . buPROPion (WELLBUTRIN SR) 12 hr tablet 150 mg   . mometasone-formoterol (DULERA) 100-5 MCG/ACT inhaler 2 puff   . enoxaparin (LOVENOX) injection 30 mg   . insulin aspart (novoLOG) injection 0-15 Units   . atorvastatin (LIPITOR) tablet 80 mg   . labetalol (NORMODYNE,TRANDATE) injection 10 mg   . alum & mag hydroxide-simeth (MAALOX/MYLANTA) 200-200-20 MG/5ML suspension 15-30 mL   . fentaNYL (SUBLIMAZE) 100 MCG/2ML injection Returned to ADS  . lactated ringers infusion   . heparin 6,000 Units in sodium chloride 0.9 % 500 mL irrigation Patient Discharge  . 0.9 % irrigation (POUR BTL) Patient Discharge  . heparin 30,000 units/NS 1000 mL solution for CELLSAVER   . 0.9 %  sodium chloride infusion (Manually program via Guardrails IV Fluids)   . sodium bicarbonate 150 mEq in sterile water 1,000 mL infusion   . electrolyte-A (PLASMALYTE-A PH 7.4) infusion   . lactated ringers infusion   . MEDLINE mouth rinse   . chlorhexidine (PERIDEX) 0.12 % solution 15 mL   . MEDLINE mouth rinse   . 0.9 % irrigation (POUR BTL) Patient Discharge     Social History:  reports that she has been smoking cigarettes. She has a 25.00 pack-year smoking history. She has never used smokeless tobacco. She reports that she does not drink alcohol or use drugs.  Family History:   Family History  Problem Relation Age of Onset  . Hypertension Mother   . Diabetes Mother   . COPD Father   . Heart disease Father 70       AMI x 3/CABG  . Diabetes Father   . Diabetes Sister   . Mental illness Sister        schizoaffective  . Diabetes Sister   . Arthritis Sister     Review of systems not obtained due to patient factors. Weight change:   Intake/Output Summary (Last 24 hours) at 04/23/2018 1609 Last data filed at 05/08/2018 1500 Gross per 24 hour  Intake 786.5 ml  Output 950 ml  Net -163.5 ml   BP (!) 102/58   Pulse (!) 109   Temp (!) 97 F (36.1 C)   Resp (!) 27   Ht 5' 4"  (1.626 m)   Wt 61.7 kg   SpO2 100%   BMI 23.35 kg/m  Vitals:   05/02/2018 1300 04/15/2018 1400 05/08/2018 1500 04/18/2018 1603  BP:      Pulse: (!) 27  (!) 33 (!) 109  Resp: 20 20 20  (!) 27  Temp: (!) 96.6 F (35.9 C) (!) 96.6 F (35.9 C) (!) 97 F (36.1 C)   TempSrc:      SpO2: 100%  100% 100%  Weight:      Height:         General appearance: Intubated and sedated, critically ill-appearing Head: Normocephalic, without obvious abnormality, atraumatic Resp: clear to auscultation bilaterally Cardio: tachycardic, no rub GI: midline dressing, +BS, soft Extremities: no pitting edema  Labs: Basic Metabolic Panel: Recent Labs  Lab 04/16/2018 1613  05/01/2018 1955  05/12/18 0006 05/12/18 0216 05/12/18 0415 05/12/18 1014 05/12/18 1652 04/27/2018 0507 04/26/2018 0927 05/08/2018 1100  NA 132*   < > 139   < > 139 141 141 140 140 140 140 142  K 5.2*   < > 5.4*   < > 4.3 4.2 4.2 5.1 5.3* 5.7* 5.3* 5.5*  CL 103  --  108  --  110  --  108 109 110 109  --  110  CO2 18*  --  20*  --  21*  --  21* 19* 19* 19*  --  20*  GLUCOSE 121*   < > 83  --  123*  --  106* 87 91 94   --  85  BUN 37*  --  38*  --  36*  --  39* 41* 42* 48*  --  51*  CREATININE 3.07*  --  3.12*  --  2.69*  --  3.14* 3.49* 3.66* 4.29*  --  4.42*  ALBUMIN 2.7*  --  1.9*  --  2.7*  --  2.7* 2.4* 2.1* 2.0*  --   --   CALCIUM 7.6*  --  6.5*  --  8.3*  --  8.0* 7.9* 7.5* 7.4*  --  7.6*  PHOS  --   --   --   --  6.3*  --  6.7*  --  6.8* 7.5*  --   --    < > = values in this interval not displayed.   Liver Function Tests: Recent Labs  Lab 04/25/2018 1955  05/12/18 0415 05/12/18 1014 05/12/18 1652 05/12/2018 0507  AST 250*  --  804* 621*  --   --   ALT 281*  --  565* 520*  --   --   ALKPHOS 32*  --  38 45  --   --   BILITOT 0.6  --  1.9* 2.0*  --   --   PROT 3.5*  --  4.4* 4.3*  --   --   ALBUMIN 1.9*   < > 2.7* 2.4* 2.1* 2.0*   < > = values in this interval not displayed.   Recent Labs  Lab 05/09/2018 0508  AMYLASE 166*   Recent Labs  Lab 05/12/18 0957  AMMONIA 20   CBC: Recent Labs  Lab 04/16/2018 1802  05/12/18 0825 05/12/18 1014 05/12/18 1652 04/16/2018 0507 04/30/2018 0927  WBC 10.1   < > 4.1 4.7 6.8 9.5  --   NEUTROABS 8.8*  --   --   --   --   --   --   HGB 8.6*   < > 10.7* 11.5* 10.9* 10.6* 8.5*  HCT 27.3*   < > 30.8* 33.0* 31.8* 31.1* 25.0*  MCV 98.2   < > 88.8 88.5 88.1 88.9  --   PLT 71*   < > 81* 87* 80* 76*  --    < > = values in this interval not displayed.   PT/INR: '@LABRCNTIP'$ (inr:5) Cardiac Enzymes: ) Recent Labs  Lab 05/07/2018 1810 05/02/2018 2000  TROPONINI <0.03 <0.03   CBG: Recent Labs  Lab 04/22/2018 0358 04/22/2018 0741 04/28/2018 1104 05/02/2018 1544 05/02/2018 1547  GLUCAP 88 74 75 64* 68*    Iron Studies: No results for input(s): IRON, TIBC, TRANSFERRIN, FERRITIN in the last 168 hours.  Xrays/Other Studies: Ct Head Wo Contrast  Result Date: 05/12/2018 CLINICAL DATA:  63 year old female status post bilateral femoral bypass graft with hypotension. Being evaluated for possible abnormal pupils on exam. EXAM: CT HEAD WITHOUT CONTRAST TECHNIQUE:  Contiguous axial images were obtained from the base of the skull through the vertex without intravenous contrast. COMPARISON:  Head CT 04/03/2012. FINDINGS: Brain: Chronic bilateral cerebral white matter hypodensity  most pronounced along the frontal horns, and involving the anterior deep white matter capsules. There is a small area of cortical hypodensity in the right middle frontal gyrus (series 3, image 22) some of which seems to have been present in 2014 (series 2 image 22 at that time). There is no associated mass effect. No acute intracranial hemorrhage identified. No midline shift, mass effect, or evidence of intracranial mass lesion. No ventriculomegaly. No other cerebral cortical abnormality identified. Vascular: Calcified atherosclerosis at the skull base. No suspicious intracranial vascular hyperdensity. Skull: No acute osseous abnormality identified. Sinuses/Orbits: Intubated. Mild to moderate paranasal sinus mucosal thickening with scattered bubbly opacity. The tympanic cavities and mastoids remain well pneumatized. Other: Negative orbit and scalp soft tissues. IMPRESSION: 1. Evidence of chronically advanced cerebral white matter disease. Probably chronic cortical encephalomalacia in the right middle frontal gyrus, some of which was evident on a 2014 comparison. No definite acute intracranial abnormality. 2. Study discussed by telephone with Neurology Dr. Rory Percy on 05/12/2018 at 0923 hours. Electronically Signed   By: Genevie Ann M.D.   On: 05/12/2018 09:34   US Renal  Result Date: 05/12/2018 CLINICAL DATA:  Acute renal injury. EXAM: RENAL / URINARY TRACT ULTRASOUND COMPLETE COMPARISON:  KUB 04/15/2018. FINDINGS: Right Kidney: Renal measurements: 9.6 x 4.5 x 5.5 cm = volume: 124.1 mL . Echogenicity within normal limits. No mass or hydronephrosis visualized. Lower pole not visualized due to overlying bowel gas. Left Kidney: Renal measurements: 9.0 x 4.8 x 4.1 cm = volume: 92.5 mL. Mild focal left renal  thinning noted suggesting scarring. Echogenicity within normal limits. No mass or hydronephrosis visualized. Bladder: Foley catheter noted in the bladder.  Tiny left pleural effusion. IMPRESSION: 1 mild focal left renal thinning noted suggesting scarring. No acute abnormality identified. No hydronephrosis. Foley noted in the bladder. No bladder distention. Electronically Signed   By: Marcello Moores  Register   On: 05/12/2018 12:21   Portable Chest Xray  Result Date: 05/12/2018 CLINICAL DATA:  63 year old female status post exploratory laparotomy for ischemic bowel and vascular surgery, aortic bypass. EXAM: PORTABLE CHEST 1 VIEW COMPARISON:  05/03/2018 and earlier. FINDINGS: Portable AP semi upright view at 0543 hours. Endotracheal tube tip in good position between the clavicles and carina. Enteric tube courses to the left abdomen, tip not included. Stable right IJ introducer sheath. Larger lung volumes. Right greater than left veiling lung base opacity. Mediastinal contours are within normal limits. No pneumothorax. Decreased pulmonary vascularity and interstitial opacity. Linear platelike opacity in the mid and lower lungs. Paucity of bowel gas in the upper abdomen. No acute osseous abnormality identified. IMPRESSION: 1. ETT tip in good position.  Otherwise stable lines and tubes. 2. Right greater than left pleural effusions suspected with atelectasis. 3. Decreased pulmonary vascularity/interstitial edema. Electronically Signed   By: Genevie Ann M.D.   On: 05/12/2018 09:36   Dg Chest Port 1 View  Result Date: 05/12/2018 CLINICAL DATA:  Postop vascular surgery.  Intubated. EXAM: PORTABLE CHEST 1 VIEW COMPARISON:  None. FINDINGS: Endotracheal tube tip is 1 cm above the carina. NG tube coils in the stomach. Bilateral pleural effusions with bibasilar atelectasis. Diffuse interstitial prominence throughout the lungs vascular congestion. Findings concerning for early interstitial edema. IMPRESSION: Endotracheal tube 1 cm  above the carina. Layering bilateral effusions with bibasilar atelectasis. Diffuse interstitial prominence, likely interstitial edema. Electronically Signed   By: Rolm Baptise M.D.   On: 04/20/2018 20:11     Assessment/Plan: 1.  ARF, oliguric/anuric- presumably due to ischemic ATN in  setting of sepsis, ABLA, and necrotic bowel.  She has not made significant urine and her potassium is climbing.  I discussed the case with her sister who is at the bedside and with Dr. Lamonte Sakai.  Given her concerning progressive kidney injury and elevated potassium with ongoing need for pressors, I would recommend starting CVVHD to help manage her electrolyte abnormalities.  Her sister and Dr. Lamonte Sakai have agreed.  Will plan for HD catheter placement and initiate CVVHD.  Will keep her even and mainly provide CVVHD for hyperkalemia and metabolic derangements.  Hopefully she will start to recover renal function now that her ischemic bowel has been dealt with. 2. VDRF- per PCCM 3. Septic shock related to ischemic/necrotic bowel.  Currently on pressors 4. PAD s/p aortobifem bypass 05/02/2018. 5. Ischemic bowel- s/p small bowel resection and right and left colectomy with colostomy  6. Lactic acidosis due to shock/ischemic bowel.  Improved with LR.  7. transaminitis- likely due to shock liver.  Continue to follow.  8. DM- metformin on hold. 9. Thrombocytopenia- will not use heparin with CVVHD.   Governor Rooks Trask Vosler 05/01/2018, 4:09 PM

## 2018-05-13 NOTE — Progress Notes (Signed)
Report given to CRNA and patient transported on monitor with 2 CRNAs & 1 OR RN to OR.

## 2018-05-13 NOTE — Transfer of Care (Signed)
Immediate Anesthesia Transfer of Care Note  Patient: Gwendolyn Pham  Procedure(s) Performed: EXPLORATORY LAPAROTOMY (N/A ) OSTOMY (N/A )  Patient Location: ICU  Anesthesia Type:General  Level of Consciousness: sedated  Airway & Oxygen Therapy: Patient remains intubated per anesthesia plan and Patient placed on Ventilator (see vital sign flow sheet for setting)  Post-op Assessment: Report given to RN and Post -op Vital signs reviewed and stable  Post vital signs: Reviewed and stable  Last Vitals:  Vitals Value Taken Time  BP 127/52 05-24-2018 10:38 AM  Temp 36.2 C 05/24/2018 10:43 AM  Pulse 80 05/24/2018 10:43 AM  Resp 11 05/24/2018 10:43 AM  SpO2 91 % May 24, 2018 10:43 AM  Vitals shown include unvalidated device data.  Last Pain:  Vitals:   05/24/18 0800  TempSrc: Esophageal  PainSc:       Patients Stated Pain Goal: 0 (04/17/2018 1445)  Complications: No apparent anesthesia complications

## 2018-05-13 NOTE — Anesthesia Preprocedure Evaluation (Signed)
Anesthesia Evaluation  Patient identified by MRN, date of birth, ID band Patient unresponsive    Reviewed: Allergy & Precautions, Patient's Chart, lab work & pertinent test results  Airway Mallampati: Intubated       Dental   Pulmonary Current Smoker,     + decreased breath sounds      Cardiovascular hypertension,  Rhythm:Regular Rate:Tachycardia     Neuro/Psych    GI/Hepatic   Endo/Other  diabetes  Renal/GU      Musculoskeletal   Abdominal   Peds  Hematology   Anesthesia Other Findings   Reproductive/Obstetrics                             Anesthesia Physical Anesthesia Plan  ASA: IV  Anesthesia Plan:    Post-op Pain Management:    Induction: Intravenous  PONV Risk Score and Plan:   Airway Management Planned: Oral ETT  Additional Equipment:   Intra-op Plan:   Post-operative Plan: Post-operative intubation/ventilation  Informed Consent: I have reviewed the patients History and Physical, chart, labs and discussed the procedure including the risks, benefits and alternatives for the proposed anesthesia with the patient or authorized representative who has indicated his/her understanding and acceptance.       Plan Discussed with: Anesthesiologist and CRNA  Anesthesia Plan Comments:         Anesthesia Quick Evaluation

## 2018-05-13 NOTE — Anesthesia Postprocedure Evaluation (Signed)
Anesthesia Post Note  Patient: Gwendolyn Pham  Procedure(s) Performed: EXPLORATORY LAPAROTOMY ILEOCOLONIC ANASTOMOS CLOSURE OF ABDOMEN (N/A Abdomen) COLOSTOMY (N/A Abdomen)     Patient location during evaluation: SICU Anesthesia Type: General Level of consciousness: sedated and patient remains intubated per anesthesia plan Pain management: pain level controlled Vital Signs Assessment: post-procedure vital signs reviewed and stable Respiratory status: patient remains intubated per anesthesia plan and patient on ventilator - see flowsheet for VS Cardiovascular status: stable Postop Assessment: no apparent nausea or vomiting Anesthetic complications: no    Last Vitals:  Vitals:   June 05, 2018 1300 05-Jun-2018 1400  BP:    Pulse: (!) 27   Resp: 20 20  Temp: (!) 35.9 C (!) 35.9 C  SpO2: 100%     Last Pain:  Vitals:   06/05/18 1200  TempSrc: Esophageal  PainSc:                  Trudy Kory COKER

## 2018-05-13 NOTE — Progress Notes (Signed)
Hypoglycemic Event  CBG: 68  Treatment: D50 25 mL (12.5 gm)  Symptoms: None  Follow-up CBG: Time: 1527 CBG Result: 85  Possible Reasons for Event: Inadequate meal intake      Gwendolyn Pham

## 2018-05-13 NOTE — Progress Notes (Signed)
NAME:  Gwendolyn Pham, MRN:  [id number], DOB:  1955/12/03, LOS: 3 ADMISSION DATE:  04/25/2018, CONSULTATION DATE:  06-01-2018 REFERRING MD:  Chestine Sporelark  CHIEF COMPLAINT:  Hypotension   Brief History   Gwendolyn Pham is a 63 y.o. female who was admitted 2/26 for aorto femoral bypass.  2/27 had hypotension and respiratory distress.  Intubated that night and getting transfused 2u PRBC.  Vascular surgery following and planning to take back to OR if hypotension does not resolve with blood products.  History of present illness   Pt is encephelopathic; therefore, this HPI is obtained from chart review. Gwendolyn Pham is a 63 y.o. female who has a PMH as outlined below (see "past medical history").  She presented to [facility] 2/26 for aortofemoral bypass due to occlusive disease.  Procedure was completed without complications.  During afternoon hours of 2/27, she sat up in bed to try and use the commode and shortly thereafter, she became hypotensive.  She was given 3L IVF bolus but BP remained soft.  Hgb had dropped from 11.1 to 8.6.  Vascular surgery was called who felt that pt needed to be intubated while further workup continued.  PCCM was called in consultation.  At the time of our evaluation, pt is awake but is in distress.  She is pale appearing.  SBP in the 80s.  She has had very low UOP, essentially anuric.  She is currently receiving 1u PRBC.  Past Medical History  PVD, HTN, HLD, DM, depression, COPD, asthma, anxiety.  Significant Hospital Events   2/26 > admit, taken to OR for aortofemoral bypass. 2/27 > intubated.  Septic shock 2/27 > emergent exploratory laparotomy, small bowel resection, bilateral colectomy  Consults:  PCCM. General surgery  Procedures:  ETT 2/27 >  Art line 2/27 >   Significant Diagnostic Tests:  CXR 2/27 > mild atelectasis. Renal US 2/28 > Head CT 2/28 >> chronically advanced cerebral white matter disease, no acute abnormality Renal ultrasound 2/28 >> no acute findings, no  postobstructive process  Micro Data:  Blood 2/28 >>   Antimicrobials:  Meropenem 2/28 >>  Vancomycin 2/28 >>   Interim history/subjective:  Remains oliguric, marginal renal function Renal ultrasound as below On fentanyl 50, Versed as needed Planning to go back to the OR today  Objective:  Blood pressure (!) 102/58, pulse (!) 126, temperature 99.3 F (37.4 C), resp. rate (!) 25, height 5\' 4"  (1.626 m), weight 61.7 kg, SpO2 94 %. CVP:  [5 mmHg-16 mmHg] 5 mmHg  Vent Mode: PRVC FiO2 (%):  [40 %-60 %] 40 % Set Rate:  [20 bmp] 20 bmp Vt Set:  [440 mL] 440 mL PEEP:  [5 cmH20] 5 cmH20 Plateau Pressure:  [19 cmH20-29 cmH20] 26 cmH20   Intake/Output Summary (Last 24 hours) at 04/28/2018 1009 Last data filed at 04/29/2018 0900 Gross per 24 hour  Intake 1482.17 ml  Output 2730 ml  Net -1247.83 ml   Filed Weights   04/22/2018 0543 04/28/2018 1700  Weight: 61.7 kg 61.7 kg    Examination: General: Obese ill-appearing woman, sedated, intubated Neuro: She did wake to stimulation, does follow commands her pupils are sluggish but do react HEENT: ET tube in place, no oral lesions Cardiovascular: Regular, no murmur Lungs: Coarse bilaterally, no wheezing, no crackles Abdomen: Abdomen with a wound VAC in place, no bowel sounds heard Musculoskeletal: No lower extremity edema Skin: Pale, cool, no rashes   Resolved Problems:  Hypocalcemia  Assessment & Plan:   Acute  respiratory failure due to ischemic bowel, septic shock Continue current vent support PRVC 8 cc/kg Assess for possible spontaneous breathing once she is stabilized postoperatively Follow intermittent chest x-ray  Bowel ischemia, status post bowel resection 2/27 Open abdomen with wound VAC in place, planning to go back to the operating room for exploration, cleanout, question possible closure 2/29  Peripheral vascular disease, aortobifemoral bypass 2/26 As per vascular surgery management Restart her anticoagulation when  stable to do so, question heparin drip depending on VVS plans  Septic shock, source consistent with ischemic bowel, possible peritonitis Norepinephrine off Blood cultures pending, negative so far Empiric meropenem and vancomycin started on 2/28  Acute renal failure, presumed ATN oliguric Lactic acidosis, anion gap metabolic acidosis, improved Hyperkalemia IV fluids as ordered Following BMP and urine output.  Renal function is worsening.  No urgent indication for hemodialysis at this time but I think will be reasonable to involve nephrology soon  Transaminitis - presumed due to hypotension / low flow.  Follow LFT intermittently, peaked on 2/28 Check coags Ammonia reassuring Consider hepatic imaging if trend is not consistent with shock liver  Anemia - as above, concern for hemorrhage but reassuring eval on exploratory laparotomy Thrombocytopenia - unclear etiology, suspect due to sepsis plus possible component of consumption.  DIC panel with adequate fibrinogen, no schistocytes Received 2 units PRBC, transfusion goal Hgb > 7 in absence active blood loss Follow serial CBC  Hx HTN, HLD. Metoprolol HCTZ, atorvastatin all on hold  Hx DM. Metformin on hold Sliding scale insulin per protocol  Hx depression, anxiety. Bupropion and venlafaxine on hold, restart when taking enteral medications   Best Practice:  Diet: NPO. Pain/Anxiety/Delirium protocol (if indicated): fentanyl gtt / midazolam PRN.  RASS goal -1. VAP protocol (if indicated): In place. DVT prophylaxis: SCD's only. GI prophylaxis: PPI. Glucose control: SSI. Mobility: Bedrest. Code Status: Full. Family Communication: Dr. Delton Coombes updated sister 2/28 and 2/29 Disposition: ICU.  Labs   CBC: Recent Labs  Lab 04/27/2018 1802  05/12/18 0415 05/12/18 0825 05/12/18 1014 05/12/18 1652 05/01/2018 0507  WBC 10.1   < > 4.5 4.1 4.7 6.8 9.5  NEUTROABS 8.8*  --   --   --   --   --   --   HGB 8.6*   < > 11.5* 10.7* 11.5* 10.9*  10.6*  HCT 27.3*   < > 34.0* 30.8* 33.0* 31.8* 31.1*  MCV 98.2   < > 89.7 88.8 88.5 88.1 88.9  PLT 71*   < > 87* 81* 87* 80* 76*   < > = values in this interval not displayed.   Basic Metabolic Panel: Recent Labs  Lab 04/16/2018 1207  05/02/2018 0508  04/19/2018 1955  05/12/18 0006 05/12/18 0216 05/12/18 0415 05/12/18 1014 05/12/18 1652 04/27/2018 0507  NA 135   < > 130*   < > 139   < > 139 141 141 140 140 140  K 4.0   < > 5.5*   < > 5.4*   < > 4.3 4.2 4.2 5.1 5.3* 5.7*  CL 104  --  99   < > 108  --  110  --  108 109 110 109  CO2 22  --  19*   < > 20*  --  21*  --  21* 19* 19* 19*  GLUCOSE 177*  --  433*   < > 83  --  123*  --  106* 87 91 94  BUN 13  --  25*   < >  38*  --  36*  --  39* 41* 42* 48*  CREATININE 1.11*  --  2.40*   < > 3.12*  --  2.69*  --  3.14* 3.49* 3.66* 4.29*  CALCIUM 7.9*  --  7.7*   < > 6.5*  --  8.3*  --  8.0* 7.9* 7.5* 7.4*  MG 1.9  --  1.6*  --  2.1  --   --   --  2.1  --   --   --   PHOS  --   --   --   --   --   --  6.3*  --  6.7*  --  6.8* 7.5*   < > = values in this interval not displayed.   GFR: Estimated Creatinine Clearance: 11.6 mL/min (A) (by C-G formula based on SCr of 4.29 mg/dL (H)). Recent Labs  Lab 05/12/18 0006 05/12/18 0415 05/12/18 0825 05/12/18 0957 05/12/18 1014 05/12/18 1652 2018-05-16 0507  WBC 3.4* 4.5 4.1  --  4.7 6.8 9.5  LATICACIDVEN 3.4* 3.5*  --  3.8*  --   --  2.3*   Liver Function Tests: Recent Labs  Lab 05/08/2018 0508 04/24/2018 1613 04/25/2018 1955 05/12/18 0006 05/12/18 0415 05/12/18 1014 05/12/18 1652 05-16-18 0507  AST 91* 140* 250*  --  804* 621*  --   --   ALT 60* 97* 281*  --  565* 520*  --   --   ALKPHOS 49 45 32*  --  38 45  --   --   BILITOT 0.6 0.7 0.6  --  1.9* 2.0*  --   --   PROT 5.7* 4.8* 3.5*  --  4.4* 4.3*  --   --   ALBUMIN 3.2* 2.7* 1.9* 2.7* 2.7* 2.4* 2.1* 2.0*   Recent Labs  Lab 04/22/2018 0508  AMYLASE 166*   Recent Labs  Lab 05/12/18 0957  AMMONIA 20   ABG    Component Value Date/Time     PHART 7.345 (L) 05/12/2018 0216   PCO2ART 37.2 05/12/2018 0216   PO2ART 136.0 (H) 05/12/2018 0216   HCO3 20.9 05/12/2018 0216   TCO2 22 05/12/2018 0216   ACIDBASEDEF 5.0 (H) 05/12/2018 0216   O2SAT 99.0 05/12/2018 0216    Coagulation Profile: Recent Labs  Lab 05/09/2018 1207 05/02/2018 1959 05/12/18 0415  INR 1.1 1.5* 1.4*   Cardiac Enzymes: Recent Labs  Lab 05/06/2018 1810 04/15/2018 2000  TROPONINI <0.03 <0.03   HbA1C: Hgb A1c MFr Bld  Date/Time Value Ref Range Status  05/02/2018 10:28 AM 6.4 (H) 4.8 - 5.6 % Final    Comment:    (NOTE)         Prediabetes: 5.7 - 6.4         Diabetes: >6.4         Glycemic control for adults with diabetes: <7.0   01/23/2018 12:18 PM 6.4 (H) 4.8 - 5.6 % Final    Comment:             Prediabetes: 5.7 - 6.4          Diabetes: >6.4          Glycemic control for adults with diabetes: <7.0    CBG: Recent Labs  Lab 05/12/18 2005 05/12/18 2352 2018/05/16 0147 05/16/18 0358 05/16/18 0741  GLUCAP 85 77 80 88 74     Critical care time: 33 minutes    Levy Pupa, MD, PhD 2018/05/16, 10:09 AM [facility] [phone] or  if no answer [phone]

## 2018-05-13 NOTE — Op Note (Signed)
05/02/2018  10:19 AM  PATIENT:  Gwendolyn Pham  63 y.o. female  PRE-OPERATIVE DIAGNOSIS:  open abdomen s/p bowel resection  POST-OPERATIVE DIAGNOSIS:  open abdomen s/p bowel resection, remaining bowel viable  PROCEDURE:  Procedure(s): EXPLORATORY LAPAROTOMY ILEOCOLONIC ANASTAMOSIS COLOSTOMY CLOSURE OF ABDOMEN  SURGEON:  Violeta Gelinas, MD  ASSISTANTS: Jaclynn Guarneri, MD  ANESTHESIA:   general  EBL:  Total I/O In: 38.7 [I.V.:38.7] Out: 35 [Urine:35]  BLOOD ADMINISTERED:none  DRAINS: none   SPECIMEN:  No Specimen  DISPOSITION OF SPECIMEN:  N/A  COUNTS:  YES  DICTATION: .Dragon Dictation Findings: Remaining small bowel viable, transverse colon viable, no evidence of abdominal compartment syndrome  Procedure in detail: Informed consent was obtained.  She is on IV antibiotics.  She was brought directly from the intensive care unit to the operating room on the ventilator.  General endotracheal anesthesia was administered by the anesthesia staff.  The outer portion of her VAC was removed and her abdomen was prepped and draped in a sterile fashion.  We did a timeout procedure.  I irrigated and removed her inner VAC drape.  I then explored the abdomen.  The transverse colon was viable.  I traced the proximal small bowel from the ligament of Treitz down to the stapled and and it was completely viable.  The rectal stump was viable.  There was no enteric contents that had leaked into the abdomen.  I irrigated out her abdomen.  Considering how well her bowel looked, decision was made to do an ileocolonic anastomosis and bring out a colostomy.  I did a side-to-side ileocolonic anastomosis from her remaining small bowel and to the transverse colon using GIA 75 stapler.  TA 60 was used to close the common defect.  A 2-0 silk was placed at the apex.  I then cleaned some of the fat around the end of the remaining transverse colon.  Medicine circular incision in the left abdominal wall and  subcutaneous tissues were carried down to the fascia.  Cruciate incision was made in the fascia and this was enlarged to allow the end of the colon to pass out.  The abdomen was copiously irrigated.  We changed our gloves.  Bowel was returned to anatomic position.  No other abnormalities were noted.  Fascia was closed with running #1 looped PDS.  We placed a sterile wet-to-dry dressing in the midline.  The colostomy was matured with 3-0 Vicryls and it was viable.  An ostomy appliance was placed.  All counts were correct.  She tolerated the procedure without apparent complication.  Of note, during closure her peak airway pressures remained 19-21.  She was taken directly back to the intensive care unit on the ventilator in critical condition. PATIENT DISPOSITION:  ICU - intubated and critically ill.   Delay start of Pharmacological VTE agent (>24hrs) due to surgical blood loss or risk of bleeding:  no  Violeta Gelinas, MD, MPH, FACS Pager: [phone]  2/29/202010:19 AM

## 2018-05-14 ENCOUNTER — Inpatient Hospital Stay (HOSPITAL_COMMUNITY): Payer: Medicare Other

## 2018-05-14 LAB — COMPREHENSIVE METABOLIC PANEL
ALT: 575 U/L — ABNORMAL HIGH (ref 0–44)
AST: 667 U/L — ABNORMAL HIGH (ref 15–41)
Albumin: 1.7 g/dL — ABNORMAL LOW (ref 3.5–5.0)
Alkaline Phosphatase: 135 U/L — ABNORMAL HIGH (ref 38–126)
Anion gap: 9 (ref 5–15)
BUN: 31 mg/dL — ABNORMAL HIGH (ref 8–23)
CO2: 22 mmol/L (ref 22–32)
Calcium: 7.4 mg/dL — ABNORMAL LOW (ref 8.9–10.3)
Chloride: 109 mmol/L (ref 98–111)
Creatinine, Ser: 2.7 mg/dL — ABNORMAL HIGH (ref 0.44–1.00)
GFR calc Af Amer: 21 mL/min — ABNORMAL LOW (ref >60)
GFR calc non Af Amer: 18 mL/min — ABNORMAL LOW (ref >60)
Glucose, Bld: 88 mg/dL (ref 70–99)
Potassium: 4.5 mmol/L (ref 3.5–5.1)
SODIUM: 140 mmol/L (ref 135–145)
Total Bilirubin: 4 mg/dL — ABNORMAL HIGH (ref 0.3–1.2)
Total Protein: 4.6 g/dL — ABNORMAL LOW (ref 6.5–8.1)

## 2018-05-14 LAB — CBC
HCT: 28.7 % — ABNORMAL LOW (ref 36.0–46.0)
Hemoglobin: 9.7 g/dL — ABNORMAL LOW (ref 12.0–15.0)
MCH: 30.7 pg (ref 26.0–34.0)
MCHC: 33.8 g/dL (ref 30.0–36.0)
MCV: 90.8 fL (ref 80.0–100.0)
PLATELETS: 64 10*3/uL — AB (ref 150–400)
RBC: 3.16 MIL/uL — ABNORMAL LOW (ref 3.87–5.11)
RDW: 16.4 % — ABNORMAL HIGH (ref 11.5–15.5)
WBC: 6.8 10*3/uL (ref 4.0–10.5)
nRBC: 0 % (ref 0.0–0.2)

## 2018-05-14 LAB — GLUCOSE, CAPILLARY
GLUCOSE-CAPILLARY: 67 mg/dL — AB (ref 70–99)
Glucose-Capillary: 113 mg/dL — ABNORMAL HIGH (ref 70–99)
Glucose-Capillary: 115 mg/dL — ABNORMAL HIGH (ref 70–99)
Glucose-Capillary: 117 mg/dL — ABNORMAL HIGH (ref 70–99)
Glucose-Capillary: 57 mg/dL — ABNORMAL LOW (ref 70–99)
Glucose-Capillary: 60 mg/dL — ABNORMAL LOW (ref 70–99)
Glucose-Capillary: 61 mg/dL — ABNORMAL LOW (ref 70–99)
Glucose-Capillary: 67 mg/dL — ABNORMAL LOW (ref 70–99)
Glucose-Capillary: 68 mg/dL — ABNORMAL LOW (ref 70–99)
Glucose-Capillary: 73 mg/dL (ref 70–99)
Glucose-Capillary: 93 mg/dL (ref 70–99)

## 2018-05-14 LAB — RENAL FUNCTION PANEL
Albumin: 1.5 g/dL — ABNORMAL LOW (ref 3.5–5.0)
Anion gap: 6 (ref 5–15)
BUN: 18 mg/dL (ref 8–23)
CO2: 25 mmol/L (ref 22–32)
Calcium: 7.4 mg/dL — ABNORMAL LOW (ref 8.9–10.3)
Chloride: 108 mmol/L (ref 98–111)
Creatinine, Ser: 1.9 mg/dL — ABNORMAL HIGH (ref 0.44–1.00)
GFR calc Af Amer: 32 mL/min — ABNORMAL LOW (ref >60)
GFR calc non Af Amer: 28 mL/min — ABNORMAL LOW (ref >60)
Glucose, Bld: 72 mg/dL (ref 70–99)
Phosphorus: 3.4 mg/dL (ref 2.5–4.6)
Potassium: 4.2 mmol/L (ref 3.5–5.1)
Sodium: 139 mmol/L (ref 135–145)

## 2018-05-14 LAB — MAGNESIUM: Magnesium: 2.4 mg/dL (ref 1.7–2.4)

## 2018-05-14 LAB — VANCOMYCIN, RANDOM: Vancomycin Rm: 36

## 2018-05-14 LAB — PHOSPHORUS: Phosphorus: 4.8 mg/dL — ABNORMAL HIGH (ref 2.5–4.6)

## 2018-05-14 LAB — LACTIC ACID, PLASMA: Lactic Acid, Venous: 1.7 mmol/L (ref 0.5–1.9)

## 2018-05-14 MED ORDER — DEXTROSE 50 % IV SOLN
12.5000 g | Freq: Once | INTRAVENOUS | Status: AC
  Administered 2018-05-14: 12.5 g via INTRAVENOUS
  Filled 2018-05-14: qty 50

## 2018-05-14 MED ORDER — VANCOMYCIN HCL IN DEXTROSE 750-5 MG/150ML-% IV SOLN
750.0000 mg | INTRAVENOUS | Status: DC
  Administered 2018-05-15: 750 mg via INTRAVENOUS
  Filled 2018-05-14 (×2): qty 150

## 2018-05-14 MED ORDER — DEXTROSE 50 % IV SOLN
12.5000 g | Freq: Once | INTRAVENOUS | Status: AC
  Administered 2018-05-14: 12.5 g via INTRAVENOUS

## 2018-05-14 MED ORDER — DEXTROSE 50 % IV SOLN
12.5000 g | INTRAVENOUS | Status: AC
  Administered 2018-05-14: 12.5 g via INTRAVENOUS

## 2018-05-14 NOTE — Progress Notes (Addendum)
Hypoglycemic Event  CBG: 68  Treatment: 12.5g d50 recheck within 30 mins  Symptoms: asymptomatic  Follow-up CBG: Time:2032 CBG Result:93  Possible Reasons for Event: improper nutriion      Gwendolyn Pham  Gwendolyn Pham

## 2018-05-14 NOTE — Progress Notes (Signed)
Patient ID: Gwendolyn Pham, female   DOB: [birth year], 63 y.o.   MRN: [id number] S: Pt started on CVVHD late yesterday evening for ongoing ARF, hyperkalemia, and metabolic acidosis.   O:BP (!) 118/52   Pulse 96   Temp 98.2 F (36.8 C) (Esophageal)   Resp 20   Ht '5\' 4"'$  (1.626 m)   Wt 70.4 kg   SpO2 100%   BMI 26.64 kg/m   Intake/Output Summary (Last 24 hours) at 05/14/2018 0721 Last data filed at 05/14/2018 0700 Gross per 24 hour  Intake 633.07 ml  Output 914 ml  Net -280.93 ml   Intake/Output: I/O last 3 completed shifts: In: 975.9 [I.V.:675.9; IV [id number]:474] Out: 2595 [Urine:490; Emesis/NG output:200; Drains:350; Other:574; Blood:25]  Intake/Output this shift:  No intake/output data recorded. Weight change:  GLO:VFIEPPIRJ and sedated CVS: RRR Resp: CTA Abd: colostomy in the LLQ, midline incision with dressing, +BS, soft Ext: 1+ edema of upper extremities, trace lower ext  Recent Labs  Lab 04/22/2018 0508 05/07/2018 1613  04/17/2018 1955  05/12/18 0006  05/12/18 0415 05/12/18 1014 05/12/18 1652 05/05/2018 0507 04/24/2018 0927 04/21/2018 1100 05/05/2018 1700 05/14/18 0504  NA 130* 132*   < > 139   < > 139   < > 141 140 140 140 140 142 143 140  K 5.5* 5.2*   < > 5.4*   < > 4.3   < > 4.2 5.1 5.3* 5.7* 5.3* 5.5* 5.6* 4.5  CL 99 103  --  108  --  110  --  108 109 110 109  --  110 110 109  CO2 19* 18*  --  20*  --  21*  --  21* 19* 19* 19*  --  20* 20* 22  GLUCOSE 433* 121*   < > 83  --  123*  --  106* 87 91 94  --  85 114* 88  BUN 25* 37*  --  38*  --  36*  --  39* 41* 42* 48*  --  51* 55* 31*  CREATININE 2.40* 3.07*  --  3.12*  --  2.69*  --  3.14* 3.49* 3.66* 4.29*  --  4.42* 4.47* 2.70*  ALBUMIN 3.2* 2.7*  --  1.9*  --  2.7*  --  2.7* 2.4* 2.1* 2.0*  --   --  1.8* 1.7*  CALCIUM 7.7* 7.6*  --  6.5*  --  8.3*  --  8.0* 7.9* 7.5* 7.4*  --  7.6* 7.5* 7.4*  PHOS  --   --   --   --   --  6.3*  --  6.7*  --  6.8* 7.5*  --   --  9.2* 4.8*  AST 91* 140*  --  250*  --   --   --  804* 621*   --   --   --   --   --  667*  ALT 60* 97*  --  281*  --   --   --  565* 520*  --   --   --   --   --  575*   < > = values in this interval not displayed.   Liver Function Tests: Recent Labs  Lab 05/12/18 0415 05/12/18 1014  04/30/2018 0507 04/23/2018 1700 05/14/18 0504  AST 804* 621*  --   --   --  667*  ALT 565* 520*  --   --   --  575*  ALKPHOS 38 45  --   --   --  135*  BILITOT 1.9* 2.0*  --   --   --  4.0*  PROT 4.4* 4.3*  --   --   --  4.6*  ALBUMIN 2.7* 2.4*   < > 2.0* 1.8* 1.7*   < > = values in this interval not displayed.   Recent Labs  Lab 04/27/2018 0508  AMYLASE 166*   Recent Labs  Lab 05/12/18 0957  AMMONIA 20   CBC: Recent Labs  Lab 05/07/2018 1802  05/12/18 0825 05/12/18 1014 05/12/18 1652 05/08/2018 0507 05/02/2018 0927 05/14/18 0504  WBC 10.1   < > 4.1 4.7 6.8 9.5  --  6.8  NEUTROABS 8.8*  --   --   --   --   --   --   --   HGB 8.6*   < > 10.7* 11.5* 10.9* 10.6* 8.5* 9.7*  HCT 27.3*   < > 30.8* 33.0* 31.8* 31.1* 25.0* 28.7*  MCV 98.2   < > 88.8 88.5 88.1 88.9  --  90.8  PLT 71*   < > 81* 87* 80* 76*  --  64*   < > = values in this interval not displayed.   Cardiac Enzymes: Recent Labs  Lab 04/29/2018 1810 05/12/2018 2000  TROPONINI <0.03 <0.03   CBG: Recent Labs  Lab 04/28/2018 1931 04/28/2018 2322 05/14/18 0324 05/14/18 0327 05/14/18 0348  GLUCAP 85 72 67* 60* 117*    Iron Studies: No results for input(s): IRON, TIBC, TRANSFERRIN, FERRITIN in the last 72 hours. Studies/Results: Ct Head Wo Contrast  Result Date: 05/12/2018 CLINICAL DATA:  63 year old female status post bilateral femoral bypass graft with hypotension. Being evaluated for possible abnormal pupils on exam. EXAM: CT HEAD WITHOUT CONTRAST TECHNIQUE: Contiguous axial images were obtained from the base of the skull through the vertex without intravenous contrast. COMPARISON:  Head CT 04/03/2012. FINDINGS: Brain: Chronic bilateral cerebral white matter hypodensity most pronounced along the  frontal horns, and involving the anterior deep white matter capsules. There is a small area of cortical hypodensity in the right middle frontal gyrus (series 3, image 22) some of which seems to have been present in 2014 (series 2 image 22 at that time). There is no associated mass effect. No acute intracranial hemorrhage identified. No midline shift, mass effect, or evidence of intracranial mass lesion. No ventriculomegaly. No other cerebral cortical abnormality identified. Vascular: Calcified atherosclerosis at the skull base. No suspicious intracranial vascular hyperdensity. Skull: No acute osseous abnormality identified. Sinuses/Orbits: Intubated. Mild to moderate paranasal sinus mucosal thickening with scattered bubbly opacity. The tympanic cavities and mastoids remain well pneumatized. Other: Negative orbit and scalp soft tissues. IMPRESSION: 1. Evidence of chronically advanced cerebral white matter disease. Probably chronic cortical encephalomalacia in the right middle frontal gyrus, some of which was evident on a 2014 comparison. No definite acute intracranial abnormality. 2. Study discussed by telephone with Neurology Dr. Rory Percy on 05/12/2018 at 0923 hours. Electronically Signed   By: Genevie Ann M.D.   On: 05/12/2018 09:34   US Renal  Result Date: 05/12/2018 CLINICAL DATA:  Acute renal injury. EXAM: RENAL / URINARY TRACT ULTRASOUND COMPLETE COMPARISON:  KUB 04/29/2018. FINDINGS: Right Kidney: Renal measurements: 9.6 x 4.5 x 5.5 cm = volume: 124.1 mL . Echogenicity within normal limits. No mass or hydronephrosis visualized. Lower pole not visualized due to overlying bowel gas. Left Kidney: Renal measurements: 9.0 x 4.8 x 4.1 cm = volume: 92.5 mL. Mild focal left renal thinning noted suggesting scarring. Echogenicity within normal limits. No mass  or hydronephrosis visualized. Bladder: Foley catheter noted in the bladder.  Tiny left pleural effusion. IMPRESSION: 1 mild focal left renal thinning noted suggesting  scarring. No acute abnormality identified. No hydronephrosis. Foley noted in the bladder. No bladder distention. Electronically Signed   By: Marcello Moores  Register   On: 05/12/2018 12:21   Dg Chest Port 1 View  Result Date: 05/14/2018 CLINICAL DATA:  Acute respiratory failure EXAM: PORTABLE CHEST 1 VIEW COMPARISON:  04/28/2018 FINDINGS: Endotracheal tube, right jugular sheath, left jugular central line and gastric catheter are again noted and stable. Cardiac shadow is within normal limits. Bibasilar opacities with associated right pleural effusion are seen slightly increased from the prior exam. No bony abnormality is noted. IMPRESSION: Slight increase in bibasilar opacities. Tubes and lines as described above. Electronically Signed   By: Inez Catalina M.D.   On: 05/14/2018 07:17   Dg Chest Port 1 View  Result Date: 04/18/2018 CLINICAL DATA:  Encounter for central line placement EXAM: PORTABLE CHEST 1 VIEW COMPARISON:  05/12/2018 FINDINGS: RIGHT-sided IJ sheath, tip overlying the superior vena cava. Endotracheal tube is in place with tip approximately 5.9 centimeters above the carina. LEFT IJ central line(s) tip overlies the superior vena cava. Nasogastric tube is in place, tip coiled within the proximal stomach. Heart size is normal. There are bibasilar opacities partially obscuring the hemidiaphragms and probably unchanged. No pneumothorax. IMPRESSION: 1. Lines and tubes as described. 2. Persistent bibasilar opacities. 3. No pneumothorax. Electronically Signed   By: Nolon Nations M.D.   On: 04/21/2018 19:26   . atorvastatin  80 mg Per Tube Daily  . chlorhexidine gluconate (MEDLINE KIT)  15 mL Mouth Rinse BID  . insulin aspart  0-9 Units Subcutaneous Q4H  . mouth rinse  15 mL Mouth Rinse 10 times per day  . metoprolol tartrate  5 mg Intravenous Q8H  . pantoprazole (PROTONIX) IV  40 mg Intravenous Daily  . vancomycin variable dose per unstable renal function (pharmacist dosing)   Does not apply See admin  instructions    BMET    Component Value Date/Time   NA 140 05/14/2018 0504   NA 137 01/23/2018 1218   K 4.5 05/14/2018 0504   CL 109 05/14/2018 0504   CO2 22 05/14/2018 0504   GLUCOSE 88 05/14/2018 0504   BUN 31 (H) 05/14/2018 0504   BUN 11 01/23/2018 1218   CREATININE 2.70 (H) 05/14/2018 0504   CREATININE 0.66 06/18/2015 1855   CALCIUM 7.4 (L) 05/14/2018 0504   GFRNONAA 18 (L) 05/14/2018 0504   GFRNONAA >89 01/31/2014 1532   GFRAA 21 (L) 05/14/2018 0504   GFRAA >89 01/31/2014 1532   CBC    Component Value Date/Time   WBC 6.8 05/14/2018 0504   RBC 3.16 (L) 05/14/2018 0504   HGB 9.7 (L) 05/14/2018 0504   HGB 15.8 01/23/2018 1218   HCT 28.7 (L) 05/14/2018 0504   HCT 45.9 01/23/2018 1218   PLT 64 (L) 05/14/2018 0504   PLT 253 01/23/2018 1218   MCV 90.8 05/14/2018 0504   MCV 93 01/23/2018 1218   MCH 30.7 05/14/2018 0504   MCHC 33.8 05/14/2018 0504   RDW 16.4 (H) 05/14/2018 0504   RDW 13.7 01/23/2018 1218   LYMPHSABS 1.1 04/30/2018 1802   LYMPHSABS 2.8 01/23/2018 1218   MONOABS 0.3 04/23/2018 1802   EOSABS 0.0 05/03/2018 1802   EOSABS 0.1 01/23/2018 1218   BASOSABS 0.0 04/18/2018 1802   BASOSABS 0.1 01/23/2018 1218     Assessment/Plan: 1.  ARF,  oliguric/anuric- presumably due to ischemic ATN in setting of sepsis, ABLA, and necrotic bowel.  She has not made significant urine and her potassium is climbing.   1. Initiated CVVHD on the evening of 05/07/2018 after trialysis cathter was placed by Dr. Lamonte Sakai.  Tolerating it well with improvement of her electrolyte abnormalities. 2. Currently keeping even with ongoing need for pressors 3. Remains oliguric but is making some urine. 4. CRRT rx:  4K/2.5Ca pre-filter fluid at 1,000 ml/hr, post-filter fluid is 0K/2.5Ca at 36m/hr, and dialysate is 4K/2.5Ca at 1,500 ml/hr.  Keeping even. 5. Hopefully we will start to recover renal function now that her ischemic bowel has been dealt with. 2. VDRF- per PCCM 3. Septic shock related  to ischemic/necrotic bowel.  Currently on pressors 4. PAD s/p aortobifem bypass 04/27/2018. 5. Ischemic bowel- s/p small bowel resection and right and left colectomy with colostomy  6. Lactic acidosis due to shock/ischemic bowel.  Improved with LR.  7. transaminitis- likely due to shock liver.  Continue to follow.  8. DM- metformin on hold. 9. Thrombocytopenia- will not use heparin with CVVHD. JDonetta Potts MD [employer]([phone]

## 2018-05-14 NOTE — Progress Notes (Signed)
Pt bs 62. Hypoglycemic protocol initiated. 15 minute check 117.

## 2018-05-14 NOTE — Progress Notes (Signed)
NAME:  Gwendolyn Pham, MRN:  [id number], DOB:  12/07/1955, LOS: 4 ADMISSION DATE:  05/09/2018, CONSULTATION DATE:  May 22, 2018 REFERRING MD:  Chestine Spore  CHIEF COMPLAINT:  Hypotension   Brief History   Gwendolyn Pham is a 63 y.o. female who was admitted 2/26 for aorto femoral bypass.  2/27 had hypotension and respiratory distress.  Intubated that night and getting transfused 2u PRBC.  Vascular surgery following and planning to take back to OR if hypotension does not resolve with blood products.  History of present illness   Pt is encephelopathic; therefore, this HPI is obtained from chart review. Gwendolyn Pham is a 63 y.o. female who has a PMH as outlined below (see "past medical history").  She presented to [facility] 2/26 for aortofemoral bypass due to occlusive disease.  Procedure was completed without complications.  During afternoon hours of 2/27, she sat up in bed to try and use the commode and shortly thereafter, she became hypotensive.  She was given 3L IVF bolus but BP remained soft.  Hgb had dropped from 11.1 to 8.6.  Vascular surgery was called who felt that pt needed to be intubated while further workup continued.  PCCM was called in consultation.  At the time of our evaluation, pt is awake but is in distress.  She is pale appearing.  SBP in the 80s.  She has had very low UOP, essentially anuric.  She is currently receiving 1u PRBC.  Past Medical History  PVD, HTN, HLD, DM, depression, COPD, asthma, anxiety.  Significant Hospital Events   2/26 > admit, taken to OR for aortofemoral bypass. 2/27 > intubated.  Septic shock 2/27 > emergent exploratory laparotomy, small bowel resection, bilateral colectomy  Consults:  PCCM. General surgery  Procedures:  ETT 2/27 >  Art line 2/27 >   Significant Diagnostic Tests:  CXR 2/27 > mild atelectasis. Renal US 2/28 > Head CT 2/28 >> chronically advanced cerebral white matter disease, no acute abnormality Renal ultrasound 2/28 >> no acute findings, no  postobstructive process  Micro Data:  Blood 2/28 >>   Antimicrobials:  Meropenem 2/28 >>  Vancomycin 2/28 >>   Interim history/subjective:  CVVHD started 2/29, tolerating off pressors Back to the OR on 2/29, abdomen/fascia closed, skin wound packed Remains on fentanyl 100 FiO2 0.40, PEEP 5  Objective:  Blood pressure (!) 114/57, pulse (!) 34, temperature 98.6 F (37 C), resp. rate (!) 22, height 5\' 4"  (1.626 m), weight 70.4 kg, SpO2 99 %. CVP:  [5 mmHg-15 mmHg] 5 mmHg  Vent Mode: PRVC FiO2 (%):  [40 %] 40 % Set Rate:  [20 bmp] 20 bmp Vt Set:  [440 mL] 440 mL PEEP:  [5 cmH20] 5 cmH20 Plateau Pressure:  [15 cmH20-19 cmH20] 15 cmH20   Intake/Output Summary (Last 24 hours) at 05/14/2018 1303 Last data filed at 05/14/2018 0800 Gross per 24 hour  Intake 580 ml  Output 870 ml  Net -290 ml   Filed Weights   04/27/2018 1700 05/01/2018 2000 05/14/18 0500  Weight: 61.7 kg 70.4 kg 70.4 kg    Examination: General: Obese, ill-appearing, sedated, intubated Neuro: Obtunded today on fentanyl 100, did not follow commands, wake to voice or stimulation HEENT: ET tube in place, no oral lesions Cardiovascular: Regular, no murmur Lungs: Coarse bilaterally, no wheezing, no crackles Abdomen: Abdomen nondistended with packing in the midline wound, ostomy in place, hypoactive bowel sounds Musculoskeletal: No lower extremity edema Skin: Pale, cool, no rashes   Resolved Problems:  Hypocalcemia  Assessment & Plan:   Acute respiratory failure due to ischemic bowel, septic shock Continue current vent support, PRVC 8 cc/kg Try to initiate spontaneous breathing trials soon as she is hemodynamically improved.  We will need to lighten her sedation in order to facilitate Follow intermittent chest x-ray  Bowel ischemia, status post bowel resection 2/27, closure 2/29 Wound care and follow-up care as per CCS recommendations  Peripheral vascular disease, aortobifemoral bypass 2/26 As per vascular  surgery management No anticoagulation at this time, defer to VVS with regard to timing  Septic shock, source consistent with ischemic bowel, possible peritonitis, improved Blood cultures pending, negative so far Continue empiric meropenem, vancomycin, started 2/28.  Consider de-escalation soon given her hemodynamic improvement and no evidence of overt perforation on her exploratory laparotomy  Acute renal failure, presumed ATN oliguric Lactic acidosis, anion gap metabolic acidosis, improved Hyperkalemia CVVHD initiated 2/29.  Appreciate nephrology assistance Hopefully she will be able to achieve a renal recovery  Transaminitis - presumed due to hypotension / low flow.  LFT remain elevated, follow serially Consider hepatic imaging if failing to resolve on 3/2  Anemia - as above, concern for hemorrhage but reassuring eval on exploratory laparotomy Thrombocytopenia - unclear etiology, suspect due to sepsis plus possible component of consumption.  DIC panel with adequate fibrinogen, no schistocytes Received 2 units PRBC, transfusion goal Hgb > 7 in absence active blood loss Follow serial CBC  Hx HTN, HLD. Toprol all, HCTZ, atorvastatin all on hold  Hx DM. Holding metformin Sliding-scale insulin per protocol  Hx depression, anxiety. Bupropion, venlafaxine on hold.  Plan restart when able to take enteral meds   Best Practice:  Diet: NPO. Pain/Anxiety/Delirium protocol (if indicated): fentanyl gtt / midazolam PRN.  RASS goal -1. VAP protocol (if indicated): In place. DVT prophylaxis: SCD's only. GI prophylaxis: PPI. Glucose control: SSI. Mobility: Bedrest. Code Status: Full. Family Communication: Dr. Delton Coombes updated sister 3/1 Disposition: ICU.  Labs   CBC: Recent Labs  Lab 04/15/2018 1802  05/12/18 0825 05/12/18 1014 05/12/18 1652 05/12/2018 0507 04/25/2018 0927 05/14/18 0504  WBC 10.1   < > 4.1 4.7 6.8 9.5  --  6.8  NEUTROABS 8.8*  --   --   --   --   --   --   --   HGB  8.6*   < > 10.7* 11.5* 10.9* 10.6* 8.5* 9.7*  HCT 27.3*   < > 30.8* 33.0* 31.8* 31.1* 25.0* 28.7*  MCV 98.2   < > 88.8 88.5 88.1 88.9  --  90.8  PLT 71*   < > 81* 87* 80* 76*  --  64*   < > = values in this interval not displayed.   Basic Metabolic Panel: Recent Labs  Lab 04/28/2018 1207  04/18/2018 0508  05/04/2018 1955  05/12/18 0415  05/12/18 1652 05/04/2018 0507 04/16/2018 0927 04/23/2018 1100 04/20/2018 1700 05/14/18 0504  NA 135   < > 130*   < > 139   < > 141   < > 140 140 140 142 143 140  K 4.0   < > 5.5*   < > 5.4*   < > 4.2   < > 5.3* 5.7* 5.3* 5.5* 5.6* 4.5  CL 104  --  99   < > 108   < > 108   < > 110 109  --  110 110 109  CO2 22  --  19*   < > 20*   < > 21*   < >  19* 19*  --  20* 20* 22  GLUCOSE 177*  --  433*   < > 83   < > 106*   < > 91 94  --  85 114* 88  BUN 13  --  25*   < > 38*   < > 39*   < > 42* 48*  --  51* 55* 31*  CREATININE 1.11*  --  2.40*   < > 3.12*   < > 3.14*   < > 3.66* 4.29*  --  4.42* 4.47* 2.70*  CALCIUM 7.9*  --  7.7*   < > 6.5*   < > 8.0*   < > 7.5* 7.4*  --  7.6* 7.5* 7.4*  MG 1.9  --  1.6*  --  2.1  --  2.1  --   --   --   --   --   --  2.4  PHOS  --   --   --   --   --    < > 6.7*  --  6.8* 7.5*  --   --  9.2* 4.8*   < > = values in this interval not displayed.   GFR: Estimated Creatinine Clearance: 20.5 mL/min (A) (by C-G formula based on SCr of 2.7 mg/dL (H)). Recent Labs  Lab 05/12/18 0415  05/12/18 0957 05/12/18 1014 05/12/18 1652 05/27/18 0507 05/14/18 0504  WBC 4.5   < >  --  4.7 6.8 9.5 6.8  LATICACIDVEN 3.5*  --  3.8*  --   --  2.3* 1.7   < > = values in this interval not displayed.   Liver Function Tests: Recent Labs  Lab 04/26/2018 1613 04/25/2018 1955  05/12/18 0415 05/12/18 1014 05/12/18 1652 May 27, 2018 0507 05/27/18 1700 05/14/18 0504  AST 140* 250*  --  804* 621*  --   --   --  667*  ALT 97* 281*  --  565* 520*  --   --   --  575*  ALKPHOS 45 32*  --  38 45  --   --   --  135*  BILITOT 0.7 0.6  --  1.9* 2.0*  --   --   --   4.0*  PROT 4.8* 3.5*  --  4.4* 4.3*  --   --   --  4.6*  ALBUMIN 2.7* 1.9*   < > 2.7* 2.4* 2.1* 2.0* 1.8* 1.7*   < > = values in this interval not displayed.   Recent Labs  Lab 04/21/2018 0508  AMYLASE 166*   Recent Labs  Lab 05/12/18 0957  AMMONIA 20   ABG    Component Value Date/Time   PHART 7.299 (L) 27-May-2018 0927   PCO2ART 43.1 05-27-18 0927   PO2ART 193.0 (H) 05/27/18 0927   HCO3 21.2 May 27, 2018 0927   TCO2 22 05/27/2018 0927   ACIDBASEDEF 5.0 (H) 05/27/18 0927   O2SAT 100.0 05-27-2018 0927    Coagulation Profile: Recent Labs  Lab 04/24/2018 1207 05/05/2018 1959 05/12/18 0415  INR 1.1 1.5* 1.4*   Cardiac Enzymes: Recent Labs  Lab 04/25/2018 1810 04/26/2018 2000  TROPONINI <0.03 <0.03   HbA1C: Hgb A1c MFr Bld  Date/Time Value Ref Range Status  05/02/2018 10:28 AM 6.4 (H) 4.8 - 5.6 % Final    Comment:    (NOTE)         Prediabetes: 5.7 - 6.4         Diabetes: >6.4  Glycemic control for adults with diabetes: <7.0   01/23/2018 12:18 PM 6.4 (H) 4.8 - 5.6 % Final    Comment:             Prediabetes: 5.7 - 6.4          Diabetes: >6.4          Glycemic control for adults with diabetes: <7.0    CBG: Recent Labs  Lab 05/14/18 0327 05/14/18 0348 05/14/18 0726 05/14/18 0756 05/14/18 1147  GLUCAP 60* 117* 61* 113* 73     Critical care time: 32 minutes    Levy Pupa, MD, PhD 05/14/2018, 1:03 PM [facility] [phone] or if no answer [phone]

## 2018-05-14 NOTE — Progress Notes (Signed)
Progress Note: General Surgery Service   Assessment/Plan: Active Problems:   Aortic occlusion (HCC)  s/p Procedure(s): EXPLORATORY LAPAROTOMY ILEOCOLONIC ANASTOMOS CLOSURE OF ABDOMEN COLOSTOMY 04/27/2018  AIOD with aortic occlusion Aortobifemoral bypass wth 14 x 8 Hemashield graft, 05/01/2018, Dr. Donnetta Hutching Ischemic distal small bowel, right and left colon ischemia: ex lap with right and left colon resection, small bowel resection 2/27 Ninfa Linden) ileo-transverse colon anastomosis and left end colostomy 2/29 Grandville Silos) -NPO, NG tube today  ARF- started on CVVHD yesterday for hyperkalemia  Plan: await return of bowel function/start tube feeds when NG output is nonbilious and <568m/day   LOS: 4 days  Chief Complaint/Subjective: OR yesterday, started CVVHD yesterday  Objective: Vital signs in last 24 hours: Temp:  [96.3 F (35.7 C)-99.3 F (37.4 C)] 98.2 F (36.8 C) (03/01 0600) Pulse Rate:  [27-132] 115 (03/01 0731) Resp:  [8-33] 26 (03/01 0731) BP: (102-128)/(52-75) 118/52 (03/01 0600) SpO2:  [90 %-100 %] 100 % (03/01 0731) Arterial Line BP: (105-152)/(54-80) 118/56 (03/01 0600) FiO2 (%):  [40 %-60 %] 40 % (03/01 0731) Weight:  [70.4 kg] 70.4 kg (03/01 0500) Last BM Date: 05/12/18  Intake/Output from previous day: 02/29 0701 - 03/01 0700 In: 633.1 [I.V.:433.1; IV Piggyback:200] Out: 914 [Urine:315; Blood:25] Intake/Output this shift: No intake/output data recorded.  Lungs: assisted, coarse  Cardiovascular: tachycardic  Abd: open abdomen with clean base, left sided ostomy red/gray in color  Extremities: no edema  Lab Results: CBC  Recent Labs    04/26/2018 0507 04/23/2018 0927 05/14/18 0504  WBC 9.5  --  6.8  HGB 10.6* 8.5* 9.7*  HCT 31.1* 25.0* 28.7*  PLT 76*  --  64*   BMET Recent Labs    04/22/2018 1700 05/14/18 0504  NA 143 140  K 5.6* 4.5  CL 110 109  CO2 20* 22  GLUCOSE 114* 88  BUN 55* 31*  CREATININE 4.47* 2.70*  CALCIUM 7.5* 7.4*    PT/INR Recent Labs    04/17/2018 1959 05/12/18 0415  LABPROT 17.9* 16.8*  INR 1.5* 1.4*   ABG Recent Labs    05/12/18 0216 04/16/2018 0927  PHART 7.345* 7.299*  HCO3 20.9 21.2    Studies/Results:  Anti-infectives: Anti-infectives (From admission, onward)   Start     Dose/Rate Route Frequency Ordered Stop   04/23/2018 2200  meropenem (MERREM) 1 g in sodium chloride 0.9 % 100 mL IVPB     1 g 200 mL/hr over 30 Minutes Intravenous Every 12 hours 05/01/2018 1714     05/12/18 1115  vancomycin (VANCOCIN) 1,250 mg in sodium chloride 0.9 % 250 mL IVPB     1,250 mg 166.7 mL/hr over 90 Minutes Intravenous  Once 05/12/18 1109 05/12/18 1348   05/12/18 1115  meropenem (MERREM) 500 mg in sodium chloride 0.9 % 100 mL IVPB  Status:  Discontinued     500 mg 200 mL/hr over 30 Minutes Intravenous Every 12 hours 05/12/18 1109 04/15/2018 1714   05/12/18 1109  vancomycin variable dose per unstable renal function (pharmacist dosing)      Does not apply See admin instructions 05/12/18 1109     04/26/2018 1800  vancomycin (VANCOCIN) IVPB 1000 mg/200 mL premix     1,000 mg 200 mL/hr over 60 Minutes Intravenous Every 12 hours 04/30/2018 1357 04/16/2018 1759   05/01/2018 0540  vancomycin (VANCOCIN) IVPB 1000 mg/200 mL premix     1,000 mg 200 mL/hr over 60 Minutes Intravenous 60 min pre-op 04/30/2018 0540 04/27/2018 1504      Medications: Scheduled  Meds: . atorvastatin  80 mg Per Tube Daily  . chlorhexidine gluconate (MEDLINE KIT)  15 mL Mouth Rinse BID  . insulin aspart  0-9 Units Subcutaneous Q4H  . mouth rinse  15 mL Mouth Rinse 10 times per day  . metoprolol tartrate  5 mg Intravenous Q8H  . pantoprazole (PROTONIX) IV  40 mg Intravenous Daily  . vancomycin variable dose per unstable renal function (pharmacist dosing)   Does not apply See admin instructions   Continuous Infusions: .  prismasol BGK 4/2.5 1,000 mL/hr at 05/14/18 0603  . sodium chloride Stopped (04/18/2018 1033)  . sodium chloride 10 mL/hr at  05/03/2018 1100  . calcium gluconate    . fentaNYL infusion INTRAVENOUS 100 mcg/hr (05/03/2018 2019)  . meropenem (MERREM) IV 1 g (04/15/2018 2200)  . norepinephrine (LEVOPHED) Adult infusion 5 mcg/min (05/09/2018 2255)  . prismasol BGK 0/2.5 300 mL/hr at 04/22/2018 1949  . prismasol BGK 4/2.5 1,500 mL/hr at 05/14/18 0603  . sodium chloride     PRN Meds:.sodium chloride, acetaminophen **OR** acetaminophen, bisacodyl, fentaNYL, heparin, heparin, labetalol, midazolam, ondansetron, phenol, potassium chloride  Mickeal Skinner, MD [facility]

## 2018-05-14 NOTE — Progress Notes (Addendum)
Hypoglycemic Event  CBG:67  Treatment:12.5g D50; recheck CBG in 15-30 min.  Symptoms: asymptomatic  Follow-up CBG: Time:1615 CBG Result:115  Possible Reasons for Event:patient NPO   Gwendolyn Pham, Debbora Lacrosse

## 2018-05-14 NOTE — Progress Notes (Addendum)
Hypoglycemic Event  CBG: 61  Treatment: D50 12.5G Injection; repeat CBG in 15 minutes  Symptoms:asymptomatic  Follow-up CBG: Time:0755 CBG Result:113  Possible Reasons for Event:patient NPO      Gwendolyn Pham, Debbora Lacrosse

## 2018-05-14 NOTE — Progress Notes (Signed)
  Progress Note    05/14/2018 9:01 AM 1 Day Post-Op  Subjective: Remains intubated/sedated  Vitals:   05/14/18 0731 05/14/18 0800  BP:  (!) 122/55  Pulse: (!) 115   Resp: (!) 26 (!) 21  Temp:  98.8 F (37.1 C)  SpO2: 100% 100%    Physical Exam: Intubated sedated Pupils are reactive to light, scleral icterus Abdomen is soft midline dressing ostomy on left side appears viable Bilateral groin wound clean dry intact Strongly palpable right dorsalis pedis pulse with signal in the left that is multiphasic  CBC    Component Value Date/Time   WBC 6.8 05/14/2018 0504   RBC 3.16 (L) 05/14/2018 0504   HGB 9.7 (L) 05/14/2018 0504   HGB 15.8 01/23/2018 1218   HCT 28.7 (L) 05/14/2018 0504   HCT 45.9 01/23/2018 1218   PLT 64 (L) 05/14/2018 0504   PLT 253 01/23/2018 1218   MCV 90.8 05/14/2018 0504   MCV 93 01/23/2018 1218   MCH 30.7 05/14/2018 0504   MCHC 33.8 05/14/2018 0504   RDW 16.4 (H) 05/14/2018 0504   RDW 13.7 01/23/2018 1218   LYMPHSABS 1.1 05/10/2018 1802   LYMPHSABS 2.8 01/23/2018 1218   MONOABS 0.3 05/04/2018 1802   EOSABS 0.0 05/05/2018 1802   EOSABS 0.1 01/23/2018 1218   BASOSABS 0.0 04/25/2018 1802   BASOSABS 0.1 01/23/2018 1218    BMET    Component Value Date/Time   NA 140 05/14/2018 0504   NA 137 01/23/2018 1218   K 4.5 05/14/2018 0504   CL 109 05/14/2018 0504   CO2 22 05/14/2018 0504   GLUCOSE 88 05/14/2018 0504   BUN 31 (H) 05/14/2018 0504   BUN 11 01/23/2018 1218   CREATININE 2.70 (H) 05/14/2018 0504   CREATININE 0.66 06/18/2015 1855   CALCIUM 7.4 (L) 05/14/2018 0504   GFRNONAA 18 (L) 05/14/2018 0504   GFRNONAA >89 01/31/2014 1532   GFRAA 21 (L) 05/14/2018 0504   GFRAA >89 01/31/2014 1532    INR    Component Value Date/Time   INR 1.4 (H) 05/12/2018 0415     Intake/Output Summary (Last 24 hours) at 05/14/2018 0901 Last data filed at 05/14/2018 0700 Gross per 24 hour  Intake 594.33 ml  Output 879 ml  Net -284.67 ml    Assessment:  63  y.o. female is s/p AoBF for occlusive disease. Colon resection for necrotic colon post op now status post abdominal closure with transverse colostomy.  Plan: Neuro: remains sedated CV: no pressors this morning Pulm: intubated, critical care management appreciated GI: Abdomen is closed and soft.  Waiting for ostomy function prior to tube feeds FEN: Dialysis has been initiated Heme/ID: h/h stable, on meropenem Dispo: Continue ICU care DVT prophylaxis: Holding heparin for thrombocytopenia, scd's  Brandon C. Randie Heinz, MD [facility] [place] [place] Office: [phone] Pager: [phone]  05/14/2018 9:01 AM

## 2018-05-14 NOTE — Progress Notes (Signed)
Pharmacy Antibiotic Note  Gwendolyn Pham is a 63 y.o. female admitted on 06/08/2018 with r/o peritonitis.  Pharmacy has been consulted for meropenem and vancomycin dosing. CRRT initiated 2/29 PM.  Cultures negative so far.  Vancomycin random level this AM = 36 - drawn ~ 10 hrs after CRRT initiated.  Plan: -Adjust meropenem to 1g q 12 hrs while on CRRT -Vancomycin 750 mg q 24 hrs while on CRRT - start 3/2 AM. -Monitor CRRT, WBC and cultures.  Height: 5\' 4"  (162.6 cm) Weight: 155 lb 3.3 oz (70.4 kg) IBW/kg (Calculated) : 54.7  Temp (24hrs), Avg:97.6 F (36.4 C), Min:96.3 F (35.7 C), Max:99.1 F (37.3 C)  Recent Labs  Lab 05/12/18 0006 05/12/18 0415 05/12/18 0825 05/12/18 0957 05/12/18 1014 05/12/18 1652 04/15/2018 0507 04/28/2018 1100 04/28/2018 1700 05/14/18 0504 05/14/18 0549  WBC 3.4* 4.5 4.1  --  4.7 6.8 9.5  --   --  6.8  --   CREATININE 2.69* 3.14*  --   --  3.49* 3.66* 4.29* 4.42* 4.47* 2.70*  --   LATICACIDVEN 3.4* 3.5*  --  3.8*  --   --  2.3*  --   --  1.7  --   VANCORANDOM  --   --   --   --   --   --   --   --   --   --  36    Estimated Creatinine Clearance: 20.5 mL/min (A) (by C-G formula based on SCr of 2.7 mg/dL (H)).    Allergies  Allergen Reactions  . Lisinopril Cough    Muscle pain   . Penicillins Rash    Did it involve swelling of the face/tongue/throat, SOB, or low BP? No Did it involve sudden or severe rash/hives, skin peeling, or any reaction on the inside of your mouth or nose? No Did you need to seek medical attention at a hospital or doctor's office? No When did it last happen?Longer than 10 years ago If all above answers are "NO", may proceed with cephalosporin use.     Antimicrobials this admission:  Vancomycin 2/28 >>  Merrem 2/28 >>   Dose adjustments this admission:   Cultures: 2/28 BCx x 2>> ngtd   Thank you for allowing pharmacy to be a part of this patient's care.  Jenetta Downer, [facility] Clinical  Pharmacist Phone [phone]  05/14/2018 10:02 AM

## 2018-05-14 DEATH — deceased

## 2018-05-15 ENCOUNTER — Encounter (HOSPITAL_COMMUNITY): Payer: Self-pay | Admitting: General Surgery

## 2018-05-15 DIAGNOSIS — G9340 Encephalopathy, unspecified: Secondary | ICD-10-CM

## 2018-05-15 DIAGNOSIS — I10 Essential (primary) hypertension: Secondary | ICD-10-CM

## 2018-05-15 DIAGNOSIS — I7410 Embolism and thrombosis of unspecified parts of aorta: Secondary | ICD-10-CM

## 2018-05-15 LAB — BPAM RBC
BLOOD PRODUCT EXPIRATION DATE: 202003182359
Blood Product Expiration Date: 202003172359
Blood Product Expiration Date: 202003182359
Blood Product Expiration Date: 202003182359
Blood Product Expiration Date: 202003212359
Blood Product Expiration Date: 202003212359
Blood Product Expiration Date: 202003222359
Blood Product Expiration Date: 202003222359
Blood Product Expiration Date: 202003222359
Blood Product Expiration Date: 202003222359
ISSUE DATE / TIME: 202002271914
ISSUE DATE / TIME: 202002272051
ISSUE DATE / TIME: 202002281025
ISSUE DATE / TIME: 202002290056
ISSUE DATE / TIME: 202002290456
ISSUE DATE / TIME: 202002272102
Unit Type and Rh: 6200
Unit Type and Rh: 6200
Unit Type and Rh: 6200
Unit Type and Rh: 6200
Unit Type and Rh: 6200
Unit Type and Rh: 6200
Unit Type and Rh: 6200
Unit Type and Rh: 6200
Unit Type and Rh: 6200
Unit Type and Rh: 6200

## 2018-05-15 LAB — TYPE AND SCREEN
ABO/RH(D): A POS
Antibody Screen: NEGATIVE
UNIT DIVISION: 0
Unit division: 0
Unit division: 0
Unit division: 0
Unit division: 0
Unit division: 0
Unit division: 0
Unit division: 0
Unit division: 0
Unit division: 0

## 2018-05-15 LAB — RENAL FUNCTION PANEL
Albumin: 1.5 g/dL — ABNORMAL LOW (ref 3.5–5.0)
Albumin: 1.5 g/dL — ABNORMAL LOW (ref 3.5–5.0)
Anion gap: 9 (ref 5–15)
Anion gap: 6 (ref 5–15)
BUN: 10 mg/dL (ref 8–23)
BUN: 7 mg/dL — AB (ref 8–23)
CO2: 24 mmol/L (ref 22–32)
CO2: 25 mmol/L (ref 22–32)
Calcium: 7.3 mg/dL — ABNORMAL LOW (ref 8.9–10.3)
Calcium: 7.1 mg/dL — ABNORMAL LOW (ref 8.9–10.3)
Chloride: 105 mmol/L (ref 98–111)
Chloride: 107 mmol/L (ref 98–111)
Creatinine, Ser: 1.65 mg/dL — ABNORMAL HIGH (ref 0.44–1.00)
Creatinine, Ser: 1.43 mg/dL — ABNORMAL HIGH (ref 0.44–1.00)
GFR calc Af Amer: 38 mL/min — ABNORMAL LOW (ref >60)
GFR calc Af Amer: 45 mL/min — ABNORMAL LOW (ref >60)
GFR calc non Af Amer: 33 mL/min — ABNORMAL LOW (ref >60)
GFR calc non Af Amer: 39 mL/min — ABNORMAL LOW (ref >60)
GLUCOSE: 101 mg/dL — AB (ref 70–99)
Glucose, Bld: 77 mg/dL (ref 70–99)
Phosphorus: 2.6 mg/dL (ref 2.5–4.6)
Phosphorus: 2.1 mg/dL — ABNORMAL LOW (ref 2.5–4.6)
Potassium: 4.2 mmol/L (ref 3.5–5.1)
Potassium: 3.8 mmol/L (ref 3.5–5.1)
Sodium: 138 mmol/L (ref 135–145)
Sodium: 138 mmol/L (ref 135–145)

## 2018-05-15 LAB — GLUCOSE, CAPILLARY
GLUCOSE-CAPILLARY: 86 mg/dL (ref 70–99)
Glucose-Capillary: 101 mg/dL — ABNORMAL HIGH (ref 70–99)
Glucose-Capillary: 114 mg/dL — ABNORMAL HIGH (ref 70–99)
Glucose-Capillary: 72 mg/dL (ref 70–99)
Glucose-Capillary: 87 mg/dL (ref 70–99)
Glucose-Capillary: 97 mg/dL (ref 70–99)
Glucose-Capillary: 97 mg/dL (ref 70–99)

## 2018-05-15 LAB — MAGNESIUM
Magnesium: 2.4 mg/dL (ref 1.7–2.4)
Magnesium: 2.3 mg/dL (ref 1.7–2.4)

## 2018-05-15 MED ORDER — VITAL AF 1.2 CAL PO LIQD
1000.0000 mL | ORAL | Status: DC
  Administered 2018-05-15 – 2018-05-16 (×2): 1000 mL

## 2018-05-15 MED ORDER — VITAL HIGH PROTEIN PO LIQD: 1000.0000 mL | ORAL | Status: DC

## 2018-05-15 MED ORDER — PRO-STAT SUGAR FREE PO LIQD: 30.0000 mL | Freq: Two times a day (BID) | ORAL | Status: DC

## 2018-05-15 MED ORDER — DEXTROSE-NACL 5-0.9 % IV SOLN
INTRAVENOUS | Status: DC
  Administered 2018-05-15 – 2018-05-21 (×11): via INTRAVENOUS

## 2018-05-15 NOTE — Progress Notes (Signed)
  Progress Note    05/15/2018 2:25 PM 2 Days Post-Op  Subjective:  Remains intubated and sedated  Vitals:   05/15/18 1200 05/15/18 1300  BP: (!) 118/57 (!) 147/62  Pulse: (!) 102 62  Resp: 18 18  Temp: 98.8 F (37.1 C) 98.8 F (37.1 C)  SpO2: 100% 98%    Physical Exam: Pupils reactive to light Remains intubated Abdomen is soft, midline dressing cdi, ostomy is viable Groin incisions cdi Feet are warm bilaterally  CBC    Component Value Date/Time   WBC 6.8 05/14/2018 0504   RBC 3.16 (L) 05/14/2018 0504   HGB 9.7 (L) 05/14/2018 0504   HGB 15.8 01/23/2018 1218   HCT 28.7 (L) 05/14/2018 0504   HCT 45.9 01/23/2018 1218   PLT 64 (L) 05/14/2018 0504   PLT 253 01/23/2018 1218   MCV 90.8 05/14/2018 0504   MCV 93 01/23/2018 1218   MCH 30.7 05/14/2018 0504   MCHC 33.8 05/14/2018 0504   RDW 16.4 (H) 05/14/2018 0504   RDW 13.7 01/23/2018 1218   LYMPHSABS 1.1 05/06/2018 1802   LYMPHSABS 2.8 01/23/2018 1218   MONOABS 0.3 04/17/2018 1802   EOSABS 0.0 05/10/2018 1802   EOSABS 0.1 01/23/2018 1218   BASOSABS 0.0 05/01/2018 1802   BASOSABS 0.1 01/23/2018 1218    BMET    Component Value Date/Time   NA 138 05/15/2018 0503   NA 137 01/23/2018 1218   K 4.2 05/15/2018 0503   CL 105 05/15/2018 0503   CO2 24 05/15/2018 0503   GLUCOSE 77 05/15/2018 0503   BUN 10 05/15/2018 0503   BUN 11 01/23/2018 1218   CREATININE 1.65 (H) 05/15/2018 0503   CREATININE 0.66 06/18/2015 1855   CALCIUM 7.3 (L) 05/15/2018 0503   GFRNONAA 33 (L) 05/15/2018 0503   GFRNONAA >89 01/31/2014 1532   GFRAA 38 (L) 05/15/2018 0503   GFRAA >89 01/31/2014 1532    INR    Component Value Date/Time   INR 1.4 (H) 05/12/2018 0415     Intake/Output Summary (Last 24 hours) at 05/15/2018 1425 Last data filed at 05/15/2018 1300 Gross per 24 hour  Intake 1130.1 ml  Output 1476 ml  Net -345.9 ml     Assessment:62 y.o.femaleis s/p AoBF for occlusive disease. Colon resection for necrotic colon post op  now status post abdominal closure with transverse colostomy.  Plan: Neuro: remains sedated, not following CV: no pressors this morning Pulm: remains intubated, critical care management appreciated GI: Abdomen is closed and soft.  no ostomy output FEN: on hd Heme/ID: h/h stable, on meropenem empirically Dispo: Continue ICU care DVT prophylaxis: holding heparin for thrombocytopenia   Jeptha Hinnenkamp C. Randie Heinz, MD [facility] [place] Office: [phone] Pager: [phone]  05/15/2018 2:25 PM

## 2018-05-15 NOTE — Consult Note (Signed)
WOC Nurse wound consult note Reason for Consult: evaluate midline wound for NPWT placement Wound type: surgical Pressure Injury POA: NA Measurement: 15.5cmx 3.5cm x 3.0cm Wound bed: subcutaneous tissue, clean Drainage (amount, consistency, odor) serosanguinous  Periwound: Medical Adhesive Related Skin Injury (MARSI) noted distally on abdomen from surgical tape and noted laterally from the adhesives from previous dressings. Areas are dry and no weeping currently Dressing procedure/placement/frequency: 1. Placed thin layer of black foam in the base of the wound to acces depth in portions of the wound. 2. 2nd piece of black foam used to fill the remainder of the wound bed. 3. Ostomy barrier ring used to fill the umbilicus and creases noted from wound bed towards the ostomy, used skin prep to protect skin  4. Seal obtained at  5.  Patient tolerated well, she is sedated and intubated. She was given a bolus of Fentanyl just prior to the dressing change.   WOC Nurse ostomy consult note Stoma type/location: LLQ, transverse colostomy Stomal assessment/size: 1 1/4" x 2, oval shaped stoma, pink with some clotting at the distal aspect of the stoma, os central Peristomal assessment: intact some MARSI noted laterally on the left, as mentioned above Treatment options for stomal/peristomal skin: NA Output bloody only Ostomy pouching: 2pc. 2 3/4" with 2"barrier ring Education provided: none, patient intubated and sedated  Enrolled patient in [employer] DC program:    WOC Nurse will follow along with you for continued support with ostomy teaching and care and NPWT dressing due to proximity of this wound to the stoma Christene Pounds [facility] MSN, RN, [employer], CNS, CWON-AP [phone]

## 2018-05-15 NOTE — Progress Notes (Signed)
Physical Therapy Discharge Patient Details Name: Gwendolyn Pham MRN: [id number] DOB: [birth year] Today's Date: 05/15/2018 Time:  -     Patient discharged from PT services secondary to medical decline - will need to re-order PT to resume therapy services.  Please see latest therapy progress note for current level of functioning and progress toward goals.    Progress and discharge plan discussed with patient and/or caregiver: Patient unable to participate in discharge planning and no caregivers available  GP     [name] [facility] 05/15/2018, 8:42 AM  Skip Mayer PT Acute Rehabilitation Services Pager [phone] Office [phone]

## 2018-05-15 NOTE — Progress Notes (Signed)
CBG: 57  Treatment: 12.5g d50 recheck within 30 mins  Symptoms: asymptomatic  Follow-up CBG: Time:0010    CBG Result:114  Possible Reasons for Event: improper nutriion

## 2018-05-15 NOTE — Progress Notes (Signed)
NAME:  Gwendolyn Pham, MRN:  [id number], DOB:  Jul 25, 1955, LOS: 5 ADMISSION DATE:  06-03-18, CONSULTATION DATE:  04/16/2018 REFERRING MD:  Chestine Spore  CHIEF COMPLAINT:  Hypotension   Brief History   Gwendolyn Pham is a 63 y.o. female who was admitted 2/26 for aorto femoral bypass.  2/27 had hypotension and respiratory distress.  Intubated that night and getting transfused 2u PRBC.  Vascular surgery following and planning to take back to OR if hypotension does not resolve with blood products.  History of present illness   Pt is encephelopathic; therefore, this HPI is obtained from chart review. Gwendolyn Pham is a 63 y.o. female who has a PMH as outlined below (see "past medical history").  She presented to [facility] 2/26 for aortofemoral bypass due to occlusive disease.  Procedure was completed without complications.  During afternoon hours of 2/27, she sat up in bed to try and use the commode and shortly thereafter, she became hypotensive.  She was given 3L IVF bolus but BP remained soft.  Hgb had dropped from 11.1 to 8.6.  Vascular surgery was called who felt that pt needed to be intubated while further workup continued.  PCCM was called in consultation.  At the time of our evaluation, pt is awake but is in distress.  She is pale appearing.  SBP in the 80s.  She has had very low UOP, essentially anuric.  She is currently receiving 1u PRBC.  Past Medical History  PVD, HTN, HLD, DM, depression, COPD, asthma, anxiety.  Significant Hospital Events   2/26 > admit, taken to OR for aortofemoral bypass. 2/27 > intubated.  Septic shock 2/27 > emergent exploratory laparotomy, small bowel resection, bilateral colectomy  Consults:  PCCM. General surgery  Procedures:  ETT 2/27 >  Art line 2/27 >   Significant Diagnostic Tests:  CXR 2/27 > mild atelectasis. Renal US 2/28 > Head CT 2/28 >> chronically advanced cerebral white matter disease, no acute abnormality Renal ultrasound 2/28 >> no acute findings, no  postobstructive process  Micro Data:  Blood 2/28 >>   Antimicrobials:  Meropenem 2/28 >>  Vancomycin 2/28 >>   Interim history/subjective:  No events overnight, off pressors  Objective:  Blood pressure 119/62, pulse (!) 50, temperature 98.6 F (37 C), resp. rate (!) 22, height 5\' 4"  (1.626 m), weight 69 kg, SpO2 100 %. CVP:  [2 mmHg-15 mmHg] 8 mmHg  Vent Mode: PRVC FiO2 (%):  [40 %] 40 % Set Rate:  [20 bmp] 20 bmp Vt Set:  [440 mL] 440 mL PEEP:  [5 cmH20] 5 cmH20 Plateau Pressure:  [18 cmH20-21 cmH20] 18 cmH20   Intake/Output Summary (Last 24 hours) at 05/15/2018 1020 Last data filed at 05/15/2018 1000 Gross per 24 hour  Intake 1113.92 ml  Output 1268 ml  Net -154.08 ml   Filed Weights   05/08/2018 2000 05/14/18 0500 05/15/18 0400  Weight: 70.4 kg 70.4 kg 69 kg    Examination: General: Obese, ill-appearing, sedated, intubated Neuro: Opens eyes but not following commands, weaning some this AM HEENT: ETT in place, [place]/AT, PERRL, EOM-I and MMM Cardiovascular: RRR, Nl S1/S2 and -M/R/G Lungs: Coarse BS diffusely Abdomen: Abdomen nondistended with packing in the midline wound, ostomy in place, hypoactive bowel sounds Musculoskeletal: No lower extremity edema Skin: Pale, cool, no rashes  Resolved Problems:  Hypocalcemia  Assessment & Plan:   Acute respiratory failure due to ischemic bowel, septic shock Begin PS trials but no extubation until more fluid negative Fluid negative  via CRRT now that hypotension is resolved Follow intermittent chest x-ray  Bowel ischemia, status post bowel resection 2/27, closure 2/29 Wound care and follow-up care as per CCS recommendations Begin trickle feeds, consult nutrition for TF  Peripheral vascular disease, aortobifemoral bypass 2/26 As per vascular surgery management No anticoagulation at this time, defer to VVS with regard to timing  Septic shock, source consistent with ischemic bowel, possible peritonitis, improved Blood cultures  negative so far Continue empiric meropenem, vancomycin, started 2/28.  Consider de-escalation soon given her hemodynamic improvement and no evidence of overt perforation on her exploratory laparotomy  Acute renal failure, presumed ATN oliguric Lactic acidosis, anion gap metabolic acidosis, improved Hyperkalemia CVVHD initiated 2/29.  Appreciate nephrology assistance Hopefully she will be able to achieve a renal recovery  Transaminitis - presumed due to hypotension / low flow.  LFT in AM Consider hepatic imaging if failing to resolve on 3/2  Anemia - as above, concern for hemorrhage but reassuring eval on exploratory laparotomy Thrombocytopenia - unclear etiology, suspect due to sepsis plus possible component of consumption.  DIC panel with adequate fibrinogen, no schistocytes Received 2 units PRBC, transfusion goal Hgb > 7 in absence active blood loss Follow serial CBC  Hx HTN, HLD. Toprol all, HCTZ, atorvastatin all on hold  Hx DM. Holding metformin Sliding-scale insulin per protocol  Hx depression, anxiety. Bupropion, venlafaxine on hold.  Plan restart when able to take enteral meds  Sister updated bedside and all questions answered  Best Practice:  Diet: Start trickle feeding this AM via nutrition Pain/Anxiety/Delirium protocol (if indicated): fentanyl gtt / midazolam PRN.  RASS goal -1. VAP protocol (if indicated): In place. DVT prophylaxis: SCD's only. GI prophylaxis: PPI. Glucose control: SSI. Mobility: Bedrest. Code Status: Full. Family Communication: Dr. Delton Coombes updated sister 3/1 Disposition: ICU.  Labs   CBC: Recent Labs  Lab 05/04/2018 1802  05/12/18 0825 05/12/18 1014 05/12/18 1652 06-09-2018 0507 June 09, 2018 0927 05/14/18 0504  WBC 10.1   < > 4.1 4.7 6.8 9.5  --  6.8  NEUTROABS 8.8*  --   --   --   --   --   --   --   HGB 8.6*   < > 10.7* 11.5* 10.9* 10.6* 8.5* 9.7*  HCT 27.3*   < > 30.8* 33.0* 31.8* 31.1* 25.0* 28.7*  MCV 98.2   < > 88.8 88.5 88.1 88.9   --  90.8  PLT 71*   < > 81* 87* 80* 76*  --  64*   < > = values in this interval not displayed.   Basic Metabolic Panel: Recent Labs  Lab 05/10/2018 0508  05/02/2018 1955  05/12/18 0415  06-09-2018 0507  2018-06-09 1100 06-09-2018 1700 05/14/18 0504 05/14/18 1554 05/15/18 0503  NA 130*   < > 139   < > 141   < > 140   < > 142 143 140 139 138  K 5.5*   < > 5.4*   < > 4.2   < > 5.7*   < > 5.5* 5.6* 4.5 4.2 4.2  CL 99   < > 108   < > 108   < > 109  --  110 110 109 108 105  CO2 19*   < > 20*   < > 21*   < > 19*  --  20* 20* GLUCOSE 433*   < > 83   < > 106*   < > 94  --  85 114*  88 72 77  BUN 25*   < > 38*   < > 39*   < > 48*  --  51* 55* 31* 18 10  CREATININE 2.40*   < > 3.12*   < > 3.14*   < > 4.29*  --  4.42* 4.47* 2.70* 1.90* 1.65*  CALCIUM 7.7*   < > 6.5*   < > 8.0*   < > 7.4*  --  7.6* 7.5* 7.4* 7.4* 7.3*  MG 1.6*  --  2.1  --  2.1  --   --   --   --   --  2.4  --  2.4  PHOS  --   --   --    < > 6.7*   < > 7.5*  --   --  9.2* 4.8* 3.4 2.6   < > = values in this interval not displayed.   GFR: Estimated Creatinine Clearance: 33.3 mL/min (A) (by C-G formula based on SCr of 1.65 mg/dL (H)). Recent Labs  Lab 05/12/18 0415  05/12/18 0957 05/12/18 1014 05/12/18 1652 05/12/2018 0507 05/14/18 0504  WBC 4.5   < >  --  4.7 6.8 9.5 6.8  LATICACIDVEN 3.5*  --  3.8*  --   --  2.3* 1.7   < > = values in this interval not displayed.   Liver Function Tests: Recent Labs  Lab 04/27/2018 1613 05/01/2018 1955  05/12/18 0415 05/12/18 1014  04/23/2018 0507 04/29/2018 1700 05/14/18 0504 05/14/18 1554 05/15/18 0503  AST 140* 250*  --  804* 621*  --   --   --  667*  --   --   ALT 97* 281*  --  565* 520*  --   --   --  575*  --   --   ALKPHOS 45 32*  --  38 45  --   --   --  135*  --   --   BILITOT 0.7 0.6  --  1.9* 2.0*  --   --   --  4.0*  --   --   PROT 4.8* 3.5*  --  4.4* 4.3*  --   --   --  4.6*  --   --   ALBUMIN 2.7* 1.9*   < > 2.7* 2.4*   < > 2.0* 1.8* 1.7* 1.5* 1.5*   < > = values in  this interval not displayed.   Recent Labs  Lab 04/25/2018 0508  AMYLASE 166*   Recent Labs  Lab 05/12/18 0957  AMMONIA 20   ABG    Component Value Date/Time   PHART 7.299 (L) 05/04/2018 0927   PCO2ART 43.1 04/27/2018 0927   PO2ART 193.0 (H) 05/01/2018 0927   HCO3 21.2 04/19/2018 0927   TCO2 22 05/04/2018 0927   ACIDBASEDEF 5.0 (H) 04/19/2018 0927   O2SAT 100.0 05/07/2018 0927    Coagulation Profile: Recent Labs  Lab 05/09/2018 1207 05/08/2018 1959 05/12/18 0415  INR 1.1 1.5* 1.4*   Cardiac Enzymes: Recent Labs  Lab 05/02/2018 1810 04/30/2018 2000  TROPONINI <0.03 <0.03   HbA1C: Hgb A1c MFr Bld  Date/Time Value Ref Range Status  05/02/2018 10:28 AM 6.4 (H) 4.8 - 5.6 % Final    Comment:    (NOTE)         Prediabetes: 5.7 - 6.4         Diabetes: >6.4         Glycemic control for adults with diabetes: <7.0   01/23/2018 12:18  PM 6.4 (H) 4.8 - 5.6 % Final    Comment:             Prediabetes: 5.7 - 6.4          Diabetes: >6.4          Glycemic control for adults with diabetes: <7.0    CBG: Recent Labs  Lab 05/14/18 2033 05/14/18 2346 05/15/18 0010 05/15/18 0357 05/15/18 0738  GLUCAP 93 57* 114* 72 87   The patient is critically ill with multiple organ systems failure and requires high complexity decision making for assessment and support, frequent evaluation and titration of therapies, application of advanced monitoring technologies and extensive interpretation of multiple databases.   Critical Care Time devoted to patient care services described in this note is  34  Minutes. This time reflects time of care of this signee Dr Koren Bound. This critical care time does not reflect procedure time, or teaching time or supervisory time of PA/NP/Med student/Med Resident etc but could involve care discussion time.  Alyson Reedy, M.D. [facility] Pulmonary/Critical Care Medicine. Pager: [phone]. After hours pager: [phone].

## 2018-05-15 NOTE — Progress Notes (Signed)
eLink Physician-Brief Progress Note Patient Name: Gwendolyn Pham DOB: 10-09-1955 MRN: [id number]   Date of Service  05/15/2018  HPI/Events of Note  BG < 80. Received couple of d50.  On Vent at 40%. Npo.   eICU Interventions  DC NS at 10 D5NS at 50 ml/hr. Follow CBG. On abx.      Intervention Category Intermediate Interventions: Other:  [name] 05/15/2018, 4:14 AM

## 2018-05-15 NOTE — Progress Notes (Addendum)
Nutrition Consult/Follow Up  DOCUMENTATION CODES:   Not applicable  INTERVENTION:    Initiate Vital AF 1.2 at trickle rate of 20 ml/hr  Provides 576 kcals, 36 gm protein, 389 ml free water daily  NUTRITION DIAGNOSIS:   Inadequate oral intake related to inability to eat as evidenced by NPO status, ongoing  GOAL:   Patient will meet greater than or equal to 90% of their needs, progressing   MONITOR:   Vent status, TF tolerance, Labs, Skin, Weight trends, I & O's  ASSESSMENT:   Pt with PMH T2DM, COPD, HTN, HLD and depression. Presented to [facility] for aortobifemoral bypass procedure on 2/26. Found to have extensive necrotic bowel which was all resected 2/27.    2/26 s/p aortobifemoral bypass for aortoiliac occlusive disease w/aortic occlusion 2/27 intubated for respiratory insufficiency 2/27 s/p exp lap; found ischemic bowel/colon  Patient is currently intubated on ventilator support MV: 9.7 L/min Temp (24hrs), Avg:98.6 F (37 C), Min:97.9 F (36.6 C), Max:99 F (37.2 C)  OGT in place  Pt s/p s/p ileocolonic anastomosis, abd closure & colostomy 2/29. RD consulted for enteral nutrition support initiation. CCS note reviewed; okay to start trickle feeds.  Anuric AKI; started on CVVHD 2/29. Labs & medications reviewed. CBG's 72-87-86.  Diet Order:   Diet Order            Diet NPO time specified  Diet effective now             EDUCATION NEEDS:   No education needs have been identified at this time  Skin:  Skin Assessment: Skin Integrity Issues: Skin Integrity Issues:: Other (Comment) Incisions: Abdomen, groin Other: MASD to coccyx & sacrum  Last BM:  3/2 colostomy   Intake/Output Summary (Last 24 hours) at 05/15/2018 1418 Last data filed at 05/15/2018 1300 Gross per 24 hour  Intake 1130.1 ml  Output 1476 ml  Net -345.9 ml   Height:   Ht Readings from Last 1 Encounters:  05/01/2018 5\' 4"  (1.626 m)   Weight:   Wt Readings from Last 1 Encounters:   05/15/18 69 kg  05/12/2018          62 kg  Ideal Body Weight:  56.8 kg  BMI:  Body mass index is 26.11 kg/m.  Estimated Nutritional Needs:   Kcal:  1450  Protein:  125-140 gm  Fluid:  per MD  Maureen Chatters, RD, LDN Pager #: [phone] After-Hours Pager #: [phone]

## 2018-05-15 NOTE — Progress Notes (Signed)
OT Cancellation Note  Patient Details Name: Gwendolyn Pham MRN: [id number] DOB: 1955-08-23   Cancelled Treatment:    Reason Eval/Treat Not Completed: Medical issues which prohibited therapy . Change in medical status, intubated/sedated, and CRRT. Will sign off for now, please re-order when medically appropriate.   Gwendolyn Pham Gwendolyn Pham 05/15/2018, 8:42 AM   Sherryl Manges OTR/L Acute Rehabilitation Services Pager: [phone] Office: [phone]

## 2018-05-15 NOTE — Progress Notes (Signed)
[facility] NEPHROLOGY PROGRESS NOTE  Assessment/ Plan: Pt is a 63 y.o. yo female admitted for aortofemoral bypass, had hypotension and respiratory failure, necrotic bowel requiring ex lap, intubated anuric AKI requiring CRRT.  #Oliguric AKI presumably due to ischemic ATN due to sepsis, AB LA and necrotic bowel: No significant urine output.  She is on CRRT since 2/29 with temporary catheter.  She is off of pressors this morning, plan to continue CRRT for now, no net UF.  Monitor urine output and electrolytes.  Avoid nephrotoxins, IV contrast.  #Acute respiratory failure on mechanical ventilation, per PCCM  #Septic shock due to ischemic and necrotic bowel: Off pressors this morning.  #PAD status post aortobifem bypass on 04/18/2018.  #Ischemic bowel status post small bowel resection and right and left colectomy with colostomy: Per surgery team.  #Thrombocytopenia: no systemic heparin with CVVH.  Subjective: Seen and examined in iCU.  No significant urine output.  Remains intubated.  On CRRT. Objective Vital signs in last 24 hours: Vitals:   05/15/18 0400 05/15/18 0411 05/15/18 0500 05/15/18 0600  BP: (!) 114/51  (!) 120/50 117/62  Pulse: 95 100 (!) 104 96  Resp: _0 (!) 24  Temp: 97.9 F (36.6 C)  98.4 F (36.9 C) 98.4 F (36.9 C)  TempSrc: Esophageal     SpO2: 100% 100% 100% 97%  Weight: 69 kg     Height:       Weight change: -1.4 kg  Intake/Output Summary (Last 24 hours) at 05/15/2018 0803 Last data filed at 05/15/2018 0700 Gross per 24 hour  Intake 962.99 ml  Output 1108 ml  Net -145.01 ml       Labs: Basic Metabolic Panel: Recent Labs  Lab 05/14/18 0504 05/14/18 1554 05/15/18 0503  NA 140 139 138  K 4.5 4.2 4.2  CL 109 108 105  CO2 _1 GLUCOSE 88 72 77  BUN 31* 18 10  CREATININE 2.70* 1.90* 1.65*  CALCIUM 7.4* 7.4* 7.3*  PHOS 4.8* 3.4 2.6   Liver Function Tests: Recent Labs  Lab 05/12/18 0415 05/12/18 1014  05/14/18 0504  05/14/18 1554 05/15/18 0503  AST 804* 621*  --  667*  --   --   ALT 565* 520*  --  575*  --   --   ALKPHOS 38 45  --  135*  --   --   BILITOT 1.9* 2.0*  --  4.0*  --   --   PROT 4.4* 4.3*  --  4.6*  --   --   ALBUMIN 2.7* 2.4*   < > 1.7* 1.5* 1.5*   < > = values in this interval not displayed.   Recent Labs  Lab 04/26/2018 0508  AMYLASE 166*   Recent Labs  Lab 05/12/18 0957  AMMONIA 20   CBC: Recent Labs  Lab 04/27/2018 1802  05/12/18 0825 05/12/18 1014 05/12/18 1652 04/27/2018 0507 05/03/2018 0927 05/14/18 0504  WBC 10.1   < > 4.1 4.7 6.8 9.5  --  6.8  NEUTROABS 8.8*  --   --   --   --   --   --   --   HGB 8.6*   < > 10.7* 11.5* 10.9* 10.6* 8.5* 9.7*  HCT 27.3*   < > 30.8* 33.0* 31.8* 31.1* 25.0* 28.7*  MCV 98.2   < > 88.8 88.5 88.1 88.9  --  90.8  PLT 71*   < > 81* 87* 80* 76*  --  64*   < > =  values in this interval not displayed.   Cardiac Enzymes: Recent Labs  Lab 04/17/2018 1810 05/12/2018 2000  TROPONINI <0.03 <0.03   CBG: Recent Labs  Lab 05/14/18 2033 05/14/18 2346 05/15/18 0010 05/15/18 0357 05/15/18 0738  GLUCAP 93 57* 114* 72 87    Iron Studies: No results for input(s): IRON, TIBC, TRANSFERRIN, FERRITIN in the last 72 hours. Studies/Results: Dg Chest Port 1 View  Result Date: 05/14/2018 CLINICAL DATA:  Acute respiratory failure EXAM: PORTABLE CHEST 1 VIEW COMPARISON:  04/22/2018 FINDINGS: Endotracheal tube, right jugular sheath, left jugular central line and gastric catheter are again noted and stable. Cardiac shadow is within normal limits. Bibasilar opacities with associated right pleural effusion are seen slightly increased from the prior exam. No bony abnormality is noted. IMPRESSION: Slight increase in bibasilar opacities. Tubes and lines as described above. Electronically Signed   By: Inez Catalina M.D.   On: 05/14/2018 07:17   Dg Chest Port 1 View  Result Date: 05/03/2018 CLINICAL DATA:  Encounter for central line placement EXAM: PORTABLE CHEST 1  VIEW COMPARISON:  05/12/2018 FINDINGS: RIGHT-sided IJ sheath, tip overlying the superior vena cava. Endotracheal tube is in place with tip approximately 5.9 centimeters above the carina. LEFT IJ central line(s) tip overlies the superior vena cava. Nasogastric tube is in place, tip coiled within the proximal stomach. Heart size is normal. There are bibasilar opacities partially obscuring the hemidiaphragms and probably unchanged. No pneumothorax. IMPRESSION: 1. Lines and tubes as described. 2. Persistent bibasilar opacities. 3. No pneumothorax. Electronically Signed   By: Nolon Nations M.D.   On: 05/01/2018 19:26    Medications: Infusions: .  prismasol BGK 4/2.5 1,000 mL/hr at 05/15/18 0800  . sodium chloride 10 mL/hr at 05/14/18 0700  . calcium gluconate    . dextrose 5 % and 0.9% NaCl 50 mL/hr at 05/15/18 0700  . fentaNYL infusion INTRAVENOUS 175 mcg/hr (05/15/18 0700)  . meropenem (MERREM) IV 1 g (05/14/18 2159)  . norepinephrine (LEVOPHED) Adult infusion 5 mcg/min (05/09/2018 2255)  . prismasol BGK 0/2.5 300 mL/hr at 05/15/18 0759  . prismasol BGK 4/2.5 1,500 mL/hr at 05/15/18 1638  . sodium chloride    . vancomycin 750 mg (05/15/18 0732)    Scheduled Medications: . atorvastatin  80 mg Per Tube Daily  . chlorhexidine gluconate (MEDLINE KIT)  15 mL Mouth Rinse BID  . insulin aspart  0-9 Units Subcutaneous Q4H  . mouth rinse  15 mL Mouth Rinse 10 times per day  . metoprolol tartrate  5 mg Intravenous Q8H  . pantoprazole (PROTONIX) IV  40 mg Intravenous Daily    have reviewed scheduled and prn medications.  Physical Exam: General: Intubated Heart:RRR, s1s2 nl, no rubs Lungs: Coarse breath sound bilateral, no wheezing Abdomen: Ex lap incision site with dressing applied, no bleeding Extremities:No edema Dialysis Access: Temporary catheter.  Gwendolyn Pham Gwendolyn Pham Gwendolyn Pham 05/15/2018,8:03 AM  LOS: 5 days

## 2018-05-15 NOTE — Progress Notes (Signed)
[facility]/Trauma Progress Note  2 Days Post-Op   Assessment/Plan  AIOD with aortic occlusion - S/P Aortobifemoral bypass wth 14 x 8 Hemashield graft, May 15, 2018, Dr. Arbie Cookey Ischemic distal small bowel, right and left colon ischemia:  - S/P ex lap with right and left colon resection, small bowel resection 2/27 Gwendolyn Pham) - S/P ileo-transverse colon anastomosis and left end colostomy 2/29 Gwendolyn Pham) - NPO, NG tube output 50 in last 24hrs, canister new this am with no output yet since 0500  FEN: NPO, NGT to LIWS, possibly start TF's tomorrow pending return of bowel function VTE: SCD's, heparin for CRRT ID: Merrem & Vanc  Foley: yes, CRRT Follow up: TBD  DISPO: await return of bowel function. Pt will likely develop ileus. qshift wet to dry dressing changes.     LOS: 5 days    Subjective: CC: ischemic bowel  Pt is on vent and will open eyes to name but does not follow commands.   Objective: Vital signs in last 24 hours: Temp:  [97.9 F (36.6 C)-99 F (37.2 C)] 98.4 F (36.9 C) (03/02 0600) Pulse Rate:  [31-110] 96 (03/02 0600) Resp:  [4-27] 24 (03/02 0600) BP: (89-126)/(49-75) 117/62 (03/02 0600) SpO2:  [95 %-100 %] 97 % (03/02 0600) Arterial Line BP: (96-134)/(50-64) 121/61 (03/02 0600) FiO2 (%):  [40 %] 40 % (03/02 0411) Weight:  [69 kg] 69 kg (03/02 0400) Last BM Date: 04/20/2018  Intake/Output from previous day: 03/01 0701 - 03/02 0700 In: 983 [I.V.:683; NG/GT:100; IV Piggyback:200] Out: 1189 [Urine:106; Emesis/NG output:50] Intake/Output this shift: No intake/output data recorded.  PE: Gen:  On vent, will open eyes to name Card:  RRR Pulm:  On vent Abd: Soft, ND, few BS appreciated, stoma appears viable without gas or stool in bag. Midline see photo below Skin: no rashes noted, warm and dry      Anti-infectives: Anti-infectives (From admission, onward)   Start     Dose/Rate Route Frequency Ordered Stop   05/15/18 0800  vancomycin (VANCOCIN)  IVPB 750 mg/150 ml premix     750 mg 150 mL/hr over 60 Minutes Intravenous Every 24 hours 05/14/18 0916     04/21/2018 2200  meropenem (MERREM) 1 g in sodium chloride 0.9 % 100 mL IVPB     1 g 200 mL/hr over 30 Minutes Intravenous Every 12 hours 04/19/2018 1714     05/12/18 1115  vancomycin (VANCOCIN) 1,250 mg in sodium chloride 0.9 % 250 mL IVPB     1,250 mg 166.7 mL/hr over 90 Minutes Intravenous  Once 05/12/18 1109 05/12/18 1348   05/12/18 1115  meropenem (MERREM) 500 mg in sodium chloride 0.9 % 100 mL IVPB  Status:  Discontinued     500 mg 200 mL/hr over 30 Minutes Intravenous Every 12 hours 05/12/18 1109 04/27/2018 1714   05/12/18 1109  vancomycin variable dose per unstable renal function (pharmacist dosing)  Status:  Discontinued      Does not apply See admin instructions 05/12/18 1109 05/14/18 0900   2018/05/15 1800  vancomycin (VANCOCIN) IVPB 1000 mg/200 mL premix     1,000 mg 200 mL/hr over 60 Minutes Intravenous Every 12 hours 2018-05-15 1357 04/27/2018 1759   May 15, 2018 0540  vancomycin (VANCOCIN) IVPB 1000 mg/200 mL premix     1,000 mg 200 mL/hr over 60 Minutes Intravenous 60 min pre-op 05-15-2018 0540 May 15, 2018 1504      Lab Results:  Recent Labs    05/01/2018 0507 05/03/2018 0927 05/14/18 0504  WBC 9.5  --  6.8  HGB 10.6* 8.5* 9.7*  HCT 31.1* 25.0* 28.7*  PLT 76*  --  64*   BMET Recent Labs    05/14/18 1554 05/15/18 0503  NA 139 138  K 4.2 4.2  CL 108 105  CO2 25 24  GLUCOSE 72 77  BUN 18 10  CREATININE 1.90* 1.65*  CALCIUM 7.4* 7.3*   PT/INR No results for input(s): LABPROT, INR in the last 72 hours. CMP     Component Value Date/Time   NA 138 05/15/2018 0503   NA 137 01/23/2018 1218   K 4.2 05/15/2018 0503   CL 105 05/15/2018 0503   CO2 24 05/15/2018 0503   GLUCOSE 77 05/15/2018 0503   BUN 10 05/15/2018 0503   BUN 11 01/23/2018 1218   CREATININE 1.65 (H) 05/15/2018 0503   CREATININE 0.66 06/18/2015 1855   CALCIUM 7.3 (L) 05/15/2018 0503   PROT 4.6 (L)  05/14/2018 0504   PROT 7.2 01/23/2018 1218   ALBUMIN 1.5 (L) 05/15/2018 0503   ALBUMIN 4.3 01/23/2018 1218   AST 667 (H) 05/14/2018 0504   ALT 575 (H) 05/14/2018 0504   ALKPHOS 135 (H) 05/14/2018 0504   BILITOT 4.0 (H) 05/14/2018 0504   BILITOT 0.2 01/23/2018 1218   GFRNONAA 33 (L) 05/15/2018 0503   GFRNONAA >89 01/31/2014 1532   GFRAA 38 (L) 05/15/2018 0503   GFRAA >89 01/31/2014 1532   Lipase  No results found for: LIPASE  Studies/Results: Dg Chest Port 1 View  Result Date: 05/14/2018 CLINICAL DATA:  Acute respiratory failure EXAM: PORTABLE CHEST 1 VIEW COMPARISON:  05-29-18 FINDINGS: Endotracheal tube, right jugular sheath, left jugular central line and gastric catheter are again noted and stable. Cardiac shadow is within normal limits. Bibasilar opacities with associated right pleural effusion are seen slightly increased from the prior exam. No bony abnormality is noted. IMPRESSION: Slight increase in bibasilar opacities. Tubes and lines as described above. Electronically Signed   By: Alcide Clever M.D.   On: 05/14/2018 07:17   Dg Chest Port 1 View  Result Date: 2018-05-29 CLINICAL DATA:  Encounter for central line placement EXAM: PORTABLE CHEST 1 VIEW COMPARISON:  05/12/2018 FINDINGS: RIGHT-sided IJ sheath, tip overlying the superior vena cava. Endotracheal tube is in place with tip approximately 5.9 centimeters above the carina. LEFT IJ central line(s) tip overlies the superior vena cava. Nasogastric tube is in place, tip coiled within the proximal stomach. Heart size is normal. There are bibasilar opacities partially obscuring the hemidiaphragms and probably unchanged. No pneumothorax. IMPRESSION: 1. Lines and tubes as described. 2. Persistent bibasilar opacities. 3. No pneumothorax. Electronically Signed   By: Norva Pavlov M.D.   On: May 29, 2018 19:26      Jerre Simon , [facility] Surgery 05/15/2018, 7:57 AM  Pager: [phone] Mon-Wed, Friday  7:00am-4:30pm Thurs 7am-11:30am  Consults: [phone]

## 2018-05-15 NOTE — Plan of Care (Signed)
  Problem: Clinical Measurements: Goal: Will remain free from infection Outcome: Progressing Goal: Diagnostic test results will improve Outcome: Progressing   Problem: Pain Managment: Goal: General experience of comfort will improve Outcome: Progressing   

## 2018-05-16 ENCOUNTER — Inpatient Hospital Stay (HOSPITAL_COMMUNITY): Payer: Medicare Other

## 2018-05-16 DIAGNOSIS — N179 Acute kidney failure, unspecified: Secondary | ICD-10-CM

## 2018-05-16 LAB — POCT I-STAT 7, (LYTES, BLD GAS, ICA,H+H)
Acid-Base Excess: 1 mmol/L (ref 0.0–2.0)
Bicarbonate: 24.8 mmol/L (ref 20.0–28.0)
Calcium, Ion: 1.09 mmol/L — ABNORMAL LOW (ref 1.15–1.40)
HCT: 29 % — ABNORMAL LOW (ref 36.0–46.0)
HEMOGLOBIN: 9.9 g/dL — AB (ref 12.0–15.0)
O2 Saturation: 95 %
Patient temperature: 37.3
Potassium: 3.5 mmol/L (ref 3.5–5.1)
SODIUM: 139 mmol/L (ref 135–145)
TCO2: 26 mmol/L (ref 22–32)
pCO2 arterial: 37.9 mmHg (ref 32.0–48.0)
pH, Arterial: 7.425 (ref 7.350–7.450)
pO2, Arterial: 73 mmHg — ABNORMAL LOW (ref 83.0–108.0)

## 2018-05-16 LAB — GLUCOSE, CAPILLARY
Glucose-Capillary: 100 mg/dL — ABNORMAL HIGH (ref 70–99)
Glucose-Capillary: 103 mg/dL — ABNORMAL HIGH (ref 70–99)
Glucose-Capillary: 109 mg/dL — ABNORMAL HIGH (ref 70–99)
Glucose-Capillary: 112 mg/dL — ABNORMAL HIGH (ref 70–99)
Glucose-Capillary: 113 mg/dL — ABNORMAL HIGH (ref 70–99)

## 2018-05-16 LAB — RENAL FUNCTION PANEL
ALBUMIN: 1.6 g/dL — AB (ref 3.5–5.0)
ANION GAP: 8 (ref 5–15)
Albumin: 1.5 g/dL — ABNORMAL LOW (ref 3.5–5.0)
Anion gap: 7 (ref 5–15)
BUN: 9 mg/dL (ref 8–23)
BUN: 11 mg/dL (ref 8–23)
CALCIUM: 7.3 mg/dL — AB (ref 8.9–10.3)
CO2: 24 mmol/L (ref 22–32)
CO2: 24 mmol/L (ref 22–32)
CREATININE: 1.3 mg/dL — AB (ref 0.44–1.00)
Calcium: 7.9 mg/dL — ABNORMAL LOW (ref 8.9–10.3)
Chloride: 104 mmol/L (ref 98–111)
Chloride: 106 mmol/L (ref 98–111)
Creatinine, Ser: 1.32 mg/dL — ABNORMAL HIGH (ref 0.44–1.00)
GFR calc Af Amer: 50 mL/min — ABNORMAL LOW (ref >60)
GFR calc Af Amer: 51 mL/min — ABNORMAL LOW (ref >60)
GFR calc non Af Amer: 43 mL/min — ABNORMAL LOW (ref >60)
GFR calc non Af Amer: 44 mL/min — ABNORMAL LOW (ref >60)
Glucose, Bld: 108 mg/dL — ABNORMAL HIGH (ref 70–99)
Glucose, Bld: 112 mg/dL — ABNORMAL HIGH (ref 70–99)
PHOSPHORUS: 1.8 mg/dL — AB (ref 2.5–4.6)
POTASSIUM: 3.9 mmol/L (ref 3.5–5.1)
Phosphorus: 1.4 mg/dL — ABNORMAL LOW (ref 2.5–4.6)
Potassium: 3.7 mmol/L (ref 3.5–5.1)
SODIUM: 136 mmol/L (ref 135–145)
SODIUM: 137 mmol/L (ref 135–145)

## 2018-05-16 LAB — HEPATIC FUNCTION PANEL
ALT: 327 U/L — ABNORMAL HIGH (ref 0–44)
AST: 409 U/L — ABNORMAL HIGH (ref 15–41)
Albumin: 1.6 g/dL — ABNORMAL LOW (ref 3.5–5.0)
Alkaline Phosphatase: 202 U/L — ABNORMAL HIGH (ref 38–126)
Bilirubin, Direct: 1.8 mg/dL — ABNORMAL HIGH (ref 0.0–0.2)
Indirect Bilirubin: 1.1 mg/dL — ABNORMAL HIGH (ref 0.3–0.9)
Total Bilirubin: 2.9 mg/dL — ABNORMAL HIGH (ref 0.3–1.2)
Total Protein: 5.4 g/dL — ABNORMAL LOW (ref 6.5–8.1)

## 2018-05-16 LAB — CBC
HCT: 30.6 % — ABNORMAL LOW (ref 36.0–46.0)
Hemoglobin: 10.2 g/dL — ABNORMAL LOW (ref 12.0–15.0)
MCH: 30.2 pg (ref 26.0–34.0)
MCHC: 33.3 g/dL (ref 30.0–36.0)
MCV: 90.5 fL (ref 80.0–100.0)
PLATELETS: 76 10*3/uL — AB (ref 150–400)
RBC: 3.38 MIL/uL — ABNORMAL LOW (ref 3.87–5.11)
RDW: 16.1 % — ABNORMAL HIGH (ref 11.5–15.5)
WBC: 12.4 10*3/uL — ABNORMAL HIGH (ref 4.0–10.5)
nRBC: 0.2 % (ref 0.0–0.2)

## 2018-05-16 LAB — MAGNESIUM
Magnesium: 2.5 mg/dL — ABNORMAL HIGH (ref 1.7–2.4)
Magnesium: 2.5 mg/dL — ABNORMAL HIGH (ref 1.7–2.4)

## 2018-05-16 MED ORDER — GERHARDT'S BUTT CREAM
TOPICAL_CREAM | Freq: Two times a day (BID) | CUTANEOUS | Status: DC
  Administered 2018-05-16 – 2018-05-20 (×8): via TOPICAL
  Administered 2018-05-21: 1 via TOPICAL
  Administered 2018-05-21 – 2018-06-01 (×21): via TOPICAL
  Administered 2018-06-01: 1 via TOPICAL
  Administered 2018-06-02 – 2018-06-03 (×2): via TOPICAL
  Filled 2018-05-16 (×2): qty 1

## 2018-05-16 MED ORDER — POTASSIUM PHOSPHATES 15 MMOLE/5ML IV SOLN
15.0000 mmol | Freq: Once | INTRAVENOUS | Status: AC
  Administered 2018-05-16: 15 mmol via INTRAVENOUS
  Filled 2018-05-16: qty 5

## 2018-05-16 MED ORDER — PRISMASOL BGK 4/2.5 32-4-2.5 MEQ/L REPLACEMENT SOLN
Status: DC
  Administered 2018-05-16 – 2018-05-18 (×3): via INTRAVENOUS_CENTRAL
  Filled 2018-05-16 (×4): qty 5000

## 2018-05-16 MED ORDER — VANCOMYCIN HCL IN DEXTROSE 750-5 MG/150ML-% IV SOLN
750.0000 mg | INTRAVENOUS | Status: DC
  Administered 2018-05-16: 750 mg via INTRAVENOUS
  Filled 2018-05-16 (×2): qty 150

## 2018-05-16 MED FILL — Sodium Chloride IV Soln 0.9%: INTRAVENOUS | Qty: 1000 | Status: AC

## 2018-05-16 MED FILL — Heparin Sodium (Porcine) Inj 1000 Unit/ML: INTRAMUSCULAR | Qty: 30 | Status: AC

## 2018-05-16 NOTE — Progress Notes (Signed)
[facility]/Trauma Progress Note  3 Days Post-Op   Assessment/Plan AIOD with aortic occlusion - S/P Aortobifemoral bypass wth 14 x 8 Hemashield graft, 04/19/2018, Dr. Arbie Cookey Ischemic distal small bowel, right and left colon ischemia:  - S/P ex lap with right and left colon resection, small bowel resection 2/27 Magnus Ivan) - S/P ileo-transverse colon anastomosis and left end colostomy 2/29 Janee Morn) - midline with wound vac  FEN: trickle TF's VTE: SCD's, heparin for CRRT ID: Merrem & Vanc, WBC up to 12.4 form 6 two days ago, monitor, afebrile Foley: yes, CRRT Follow up: TBD  DISPO: continue trickle TF, wound vac. Monitor WBC    LOS: 6 days    Subjective: CC: ischemic bowel  Awake on vent, not following commands.   Objective: Vital signs in last 24 hours: Temp:  [97.7 F (36.5 C)-99.9 F (37.7 C)] 99.9 F (37.7 C) (03/03 0700) Pulse Rate:  [31-113] 79 (03/03 0724) Resp:  [16-41] 25 (03/03 0724) BP: (100-157)/(48-73) 125/52 (03/03 0724) SpO2:  [94 %-100 %] 100 % (03/03 0724) Arterial Line BP: (105-171)/(48-76) 155/71 (03/03 0700) FiO2 (%):  [40 %] 40 % (03/03 0724) Weight:  [67.4 kg] 67.4 kg (03/03 0317) Last BM Date: 05/15/18  Intake/Output from previous day: 03/02 0701 - 03/03 0700 In: 2000.7 [I.V.:1274.4; NG/GT:376.3; IV Piggyback:350] Out: 3548 [Urine:25] Intake/Output this shift: No intake/output data recorded.  PE: Gen:  On vent, will open eyes to name Card:  RRR Pulm:  On vent Abd: Soft, ND, no BS appreciated, stoma appears viable without gas or stool in bag. Midline with wound vac in place with scant serosanguinous output in canister Skin: warm and dry   Anti-infectives: Anti-infectives (From admission, onward)   Start     Dose/Rate Route Frequency Ordered Stop   05/15/18 0800  vancomycin (VANCOCIN) IVPB 750 mg/150 ml premix     750 mg 150 mL/hr over 60 Minutes Intravenous Every 24 hours 05/14/18 0916     05/03/2018 2200  meropenem  (MERREM) 1 g in sodium chloride 0.9 % 100 mL IVPB     1 g 200 mL/hr over 30 Minutes Intravenous Every 12 hours 05/07/2018 1714     05/12/18 1115  vancomycin (VANCOCIN) 1,250 mg in sodium chloride 0.9 % 250 mL IVPB     1,250 mg 166.7 mL/hr over 90 Minutes Intravenous  Once 05/12/18 1109 05/12/18 1348   05/12/18 1115  meropenem (MERREM) 500 mg in sodium chloride 0.9 % 100 mL IVPB  Status:  Discontinued     500 mg 200 mL/hr over 30 Minutes Intravenous Every 12 hours 05/12/18 1109 04/25/2018 1714   05/12/18 1109  vancomycin variable dose per unstable renal function (pharmacist dosing)  Status:  Discontinued      Does not apply See admin instructions 05/12/18 1109 05/14/18 0900   04/30/2018 1800  vancomycin (VANCOCIN) IVPB 1000 mg/200 mL premix     1,000 mg 200 mL/hr over 60 Minutes Intravenous Every 12 hours 05/02/2018 1357 May 27, 2018 1759   04/29/2018 0540  vancomycin (VANCOCIN) IVPB 1000 mg/200 mL premix     1,000 mg 200 mL/hr over 60 Minutes Intravenous 60 min pre-op 04/18/2018 0540 04/25/2018 1504      Lab Results:  Recent Labs    05/14/18 0504 05/16/18 0344 05/16/18 0500  WBC 6.8  --  12.4*  HGB 9.7* 9.9* 10.2*  HCT 28.7* 29.0* 30.6*  PLT 64*  --  76*   BMET Recent Labs    05/15/18 1622 05/16/18 0344 05/16/18 0500  NA 138  139 136  K 3.8 3.5 3.9  CL 107  --  104  CO2 25  --  24  GLUCOSE 101*  --  108*  BUN 7*  --  9  CREATININE 1.43*  --  1.32*  CALCIUM 7.1*  --  7.9*   PT/INR No results for input(s): LABPROT, INR in the last 72 hours. CMP     Component Value Date/Time   NA 136 05/16/2018 0500   NA 137 01/23/2018 1218   K 3.9 05/16/2018 0500   CL 104 05/16/2018 0500   CO2 24 05/16/2018 0500   GLUCOSE 108 (H) 05/16/2018 0500   BUN 9 05/16/2018 0500   BUN 11 01/23/2018 1218   CREATININE 1.32 (H) 05/16/2018 0500   CREATININE 0.66 06/18/2015 1855   CALCIUM 7.9 (L) 05/16/2018 0500   PROT 5.4 (L) 05/16/2018 0500   PROT 7.2 01/23/2018 1218   ALBUMIN 1.6 (L) 05/16/2018 0500    ALBUMIN 1.6 (L) 05/16/2018 0500   ALBUMIN 4.3 01/23/2018 1218   AST 409 (H) 05/16/2018 0500   ALT 327 (H) 05/16/2018 0500   ALKPHOS 202 (H) 05/16/2018 0500   BILITOT 2.9 (H) 05/16/2018 0500   BILITOT 0.2 01/23/2018 1218   GFRNONAA 43 (L) 05/16/2018 0500   GFRNONAA >89 01/31/2014 1532   GFRAA 50 (L) 05/16/2018 0500   GFRAA >89 01/31/2014 1532   Lipase  No results found for: LIPASE  Studies/Results: No results found.    Jerre Simon , [facility] Surgery 05/16/2018, 7:36 AM  Pager: [phone] Mon-Wed, Friday 7:00am-4:30pm Thurs 7am-11:30am  Consults: [phone]

## 2018-05-16 NOTE — Progress Notes (Signed)
[facility] NEPHROLOGY PROGRESS NOTE  Assessment/ Plan: Pt is a 63 y.o. yo female admitted for aortofemoral bypass, had hypotension and respiratory failure, necrotic bowel requiring ex lap, intubated anuric AKI requiring CRRT.  #Oliguric AKI presumably due to ischemic ATN due to sepsis, ABLA and necrotic bowel: No significant urine output.  She is on CRRT since 2/29 with temporary catheter.  Continue CRRT with 50-100 cc an hour net UF, change post filter potassium to 4K.  Monitor urine output and electrolytes.  Avoid nephrotoxins, IV contrast.  #Acute respiratory failure on mechanical ventilation, per PCCM  #Septic shock due to ischemic and necrotic bowel: Off pressors this morning.  #PAD status post aortobifem bypass on 04/22/2018.  #Ischemic bowel status post small bowel resection and right and left colectomy with colostomy: Per surgery team.  #Thrombocytopenia: no systemic heparin with CVVH.  Subjective: Seen and examined in iCU.  Anuric, tolerating UF 5200 cc an hour.  Remains intubated.  No new event.  Objective Vital signs in last 24 hours: Vitals:   05/16/18 0724 05/16/18 0755 05/16/18 0800 05/16/18 0900  BP: (!) 125/52  (!) 117/58 128/90  Pulse: 79  (!) 49 (!) 55  Resp: (!) 25  19 (!) 22  Temp:  99.5 F (37.5 C) 99.3 F (37.4 C) 99.1 F (37.3 C)  TempSrc:      SpO2: 100%  100% 100%  Weight:      Height:       Weight change: -1.6 kg  Intake/Output Summary (Last 24 hours) at 05/16/2018 0946 Last data filed at 05/16/2018 0900 Gross per 24 hour  Intake 2053.62 ml  Output 3611 ml  Net -1557.38 ml       Labs: Basic Metabolic Panel: Recent Labs  Lab 05/15/18 0503 05/15/18 1622 05/16/18 0344 05/16/18 0500  NA 138 138 139 136  K 4.2 3.8 3.5 3.9  CL 105 107  --  104  CO2 24 25  --  24  GLUCOSE 77 101*  --  108*  BUN 10 7*  --  9  CREATININE 1.65* 1.43*  --  1.32*  CALCIUM 7.3* 7.1*  --  7.9*  PHOS 2.6 2.1*  --  1.8*   Liver Function  Tests: Recent Labs  Lab 05/12/18 1014  05/14/18 0504  05/15/18 0503 05/15/18 1622 05/16/18 0500  AST 621*  --  667*  --   --   --  409*  ALT 520*  --  575*  --   --   --  327*  ALKPHOS 45  --  135*  --   --   --  202*  BILITOT 2.0*  --  4.0*  --   --   --  2.9*  PROT 4.3*  --  4.6*  --   --   --  5.4*  ALBUMIN 2.4*   < > 1.7*   < > 1.5* 1.5* 1.6*  1.6*   < > = values in this interval not displayed.   Recent Labs  Lab 05/07/2018 0508  AMYLASE 166*   Recent Labs  Lab 05/12/18 0957  AMMONIA 20   CBC: Recent Labs  Lab 04/20/2018 1802  05/12/18 1014 05/12/18 1652 04/30/2018 0507  05/14/18 0504 05/16/18 0344 05/16/18 0500  WBC 10.1   < > 4.7 6.8 9.5  --  6.8  --  12.4*  NEUTROABS 8.8*  --   --   --   --   --   --   --   --  HGB 8.6*   < > 11.5* 10.9* 10.6*   < > 9.7* 9.9* 10.2*  HCT 27.3*   < > 33.0* 31.8* 31.1*   < > 28.7* 29.0* 30.6*  MCV 98.2   < > 88.5 88.1 88.9  --  90.8  --  90.5  PLT 71*   < > 87* 80* 76*  --  64*  --  76*   < > = values in this interval not displayed.   Cardiac Enzymes: Recent Labs  Lab 05/05/2018 1810 04/26/2018 2000  TROPONINI <0.03 <0.03   CBG: Recent Labs  Lab 05/15/18 1615 05/15/18 1943 05/15/18 2355 05/16/18 0340 05/16/18 0752  GLUCAP 97 101* 97 112* 103*    Iron Studies: No results for input(s): IRON, TIBC, TRANSFERRIN, FERRITIN in the last 72 hours. Studies/Results: Dg Chest Port 1 View  Result Date: 05/16/2018 CLINICAL DATA:  Respiratory failure. Intubated patient. Follow-up exam. EXAM: PORTABLE CHEST 1 VIEW COMPARISON:  05/14/2018 and older exams. FINDINGS: Prominent bronchovascular markings and mild bilateral interstitial thickening is similar to the most recent prior exam. There are bilateral lung base opacities, greater on the right, similar to the prior exam, consistent with a combination of atelectasis and small effusions. No new lung abnormalities. No pneumothorax. Endotracheal tube, nasal/orogastric tube, left internal  jugular dual lumen central venous catheter and right internal jugular catheter are stable from the prior exam. IMPRESSION: 1. No significant change from the most recent prior study. 2. Mild interstitial edema small pleural effusions and lung base atelectasis, greater on the right. No new lung abnormalities. 3. Support apparatus is stable and well positioned. Electronically Signed   By: Lajean Manes M.D.   On: 05/16/2018 09:16    Medications: Infusions: .  prismasol BGK 4/2.5 1,000 mL/hr at 05/15/18 2202  .  prismasol BGK 4/2.5    . sodium chloride 10 mL/hr at 05/14/18 0700  . dextrose 5 % and 0.9% NaCl Stopped (05/16/18 0812)  . fentaNYL infusion INTRAVENOUS 100 mcg/hr (05/16/18 0900)  . meropenem (MERREM) IV 1 g (05/16/18 0910)  . prismasol BGK 4/2.5 1,500 mL/hr at 05/16/18 0411  . vancomycin 150 mL/hr at 05/16/18 0900    Scheduled Medications: . atorvastatin  80 mg Per Tube Daily  . chlorhexidine gluconate (MEDLINE KIT)  15 mL Mouth Rinse BID  . feeding supplement (VITAL AF 1.2 CAL)  1,000 mL Per Tube Q24H  . Gerhardt's butt cream   Topical BID  . insulin aspart  0-9 Units Subcutaneous Q4H  . mouth rinse  15 mL Mouth Rinse 10 times per day  . metoprolol tartrate  5 mg Intravenous Q8H  . pantoprazole (PROTONIX) IV  40 mg Intravenous Daily    have reviewed scheduled and prn medications.  Physical Exam: General: Intubated, sedated Heart: Regular rate rhythm S1-S2 normal, no rubs Lungs: Coarse breath sound bilateral, no wheezing Abdomen: Ex lap incision site with dressing applied, no bleeding Extremities:No edema Dialysis Access: Temporary catheter.  No bleeding  Osias Resnick Prasad Vollie Aaron 05/16/2018,9:46 AM  LOS: 6 days

## 2018-05-16 NOTE — Care Management Note (Addendum)
Case Management Note  Patient Details  Name: Gwendolyn Pham MRN: [id number] Date of Birth: [birth year]  Subjective/Objective:  63 yo female presented for a scheduled aortofemoral bypass; PMH: PVD, HTN, HLD, DM, depression, COPD, asthma, anxiety.            Action/Plan: Patient currently on ventilator s/p aortofemoral bypass 2/26; 2/27 had hypotension and respiratory distress. CM team will continue to follow progress.  Expected Discharge Date:                  Expected Discharge Plan:  Home w Home Health Services  In-House Referral:  NA  Discharge planning Services  CM Consult  Post Acute Care Choice:  Home Health Choice offered to:  Patient  DME Arranged:  N/A DME Agency:  NA  HH Arranged:  RN, Disease Management, PT HH Agency:  [facility](Pre-op referral )  Status of Service:  In process, will continue to follow  If discussed at Long Length of Stay Meetings, dates discussed:    Additional Comments: 05/19/18 @ 1406-[name] RNCM- Patient continues to progress, is now extubated with confusion. PT recommends CIR with rehab admissions coordinator following for appropriateness. CM will continue to follow for dispositional needs.  [name] RN, BSN, NCM-BC, ACM-RN [phone] 05/16/2018, 12:54 PM

## 2018-05-16 NOTE — Progress Notes (Addendum)
NAME:  Gwendolyn Pham, MRN:  [id number], DOB:  Jun 07, 1955, LOS: 6 ADMISSION DATE:  05/01/2018, CONSULTATION DATE:  May 27, 2018 REFERRING MD:  Chestine Spore  CHIEF COMPLAINT:  Hypotension   Brief History   Gwendolyn Pham is a 63 y.o. female who was admitted 2/26 for aorto femoral bypass.  2/27 had hypotension and respiratory distress.  Intubated that night and getting transfused 2u PRBC.  Vascular surgery following and planning to take back to OR if hypotension does not resolve with blood products.  History of present illness   Pt is encephelopathic; therefore, this HPI is obtained from chart review. Gwendolyn Pham is a 63 y.o. female who has a PMH as outlined below (see "past medical history").  She presented to [facility] 2/26 for aortofemoral bypass due to occlusive disease.  Procedure was completed without complications.  During afternoon hours of 2/27, she sat up in bed to try and use the commode and shortly thereafter, she became hypotensive.  She was given 3L IVF bolus but BP remained soft.  Hgb had dropped from 11.1 to 8.6.  Vascular surgery was called who felt that pt needed to be intubated while further workup continued.  PCCM was called in consultation.  At the time of our evaluation, pt is awake but is in distress.  She is pale appearing.  SBP in the 80s.  She has had very low UOP, essentially anuric.  She is currently receiving 1u PRBC.  Past Medical History  PVD, HTN, HLD, DM, depression, COPD, asthma, anxiety.  Significant Hospital Events   2/26 > admit, taken to OR for aortofemoral bypass. 2/27 > intubated.  Septic shock 2/27 > emergent exploratory laparotomy, small bowel resection, bilateral colectomy  Consults:  PCCM. General surgery  Procedures:  ETT 2/27 >  Art line 2/27 >   Significant Diagnostic Tests:  CXR 2/27 > mild atelectasis. Renal US 2/28 > Head CT 2/28 >> chronically advanced cerebral white matter disease, no acute abnormality Renal ultrasound 2/28 >> no acute findings, no  postobstructive process CXR 3/3> bilateral lower lobe opacities R>L  Micro Data:  Blood 2/28 >> NGTD   Antimicrobials:  Meropenem 2/28 >>  Vancomycin 2/28 >>   Interim history/subjective:  Did not tolerate vent wean this morning, switched to pressure control   Objective:  Blood pressure (!) 106/52, pulse (!) 50, temperature 99 F (37.2 C), resp. rate 20, height 5\' 4"  (1.626 m), weight 67.4 kg, SpO2 100 %. CVP:  [0 mmHg-22 mmHg] 0 mmHg  Vent Mode: PRVC FiO2 (%):  [40 %] 40 % Set Rate:  [20 bmp] 20 bmp Vt Set:  [440 mL] 440 mL PEEP:  [5 cmH20] 5 cmH20 Pressure Support:  [12 cmH20] 12 cmH20 Plateau Pressure:  [17 cmH20-20 cmH20] 17 cmH20   Intake/Output Summary (Last 24 hours) at 05/16/2018 1030 Last data filed at 05/16/2018 1000 Gross per 24 hour  Intake 2133.7 ml  Output 3867 ml  Net -1733.3 ml   Filed Weights   05/14/18 0500 05/15/18 0400 05/16/18 0317  Weight: 70.4 kg 69 kg 67.4 kg    Examination: General: Obese, critically ill appearing adult female, intubated, sedated, NAD  Neuro: Sedated. Opens eyes spontaneously, moves extremities spontaneously. Does not follow commands.  HEENT: NCAT, ETT OGT secure. Icteric sclera L>R. Pink, moist mucus membranes  Cardiovascular: RRR s1s2 no r/g/m  Lungs: Scattered crackles bilaterally.  Abdomen: round, non-distended abdomen, hypoactive bowel sounds. Midline wound packing. Ostomy beefy red.  Musculoskeletal: symmetrical bulk and tone.  Skin: Ecchymosis  scattered on abdomen, pubis. Clean, dry, warm.   Resolved Problems:  Hypocalcemia  Assessment & Plan:   Acute respiratory failure -septic shock, ischemic bowel  P AM WUA/SBT Did no tolerate PS wean 3/3, changed to PCV Fluid negative via CRRT, per CRRT  AM CXR   Bowel ischemia, status post bowel resection 2/27, closure 2/29 Wound care and follow-up care as per CCS recommendations Currently receiving trickle feeds, continue trickle feeds per SGY team In future, RDN consult  for EN   Peripheral vascular disease, aortobifemoral bypass 2/26 As per vascular surgery management No anticoagulation at this time, defer to VVS with regard to timing SCDs only for VTE ppx   Septic shock, source consistent with ischemic bowel, possible peritonitis, improved Blood cultures negative so far Vanc/Mero started 2/28, continue  MAPs remain >65 off of pressors   Acute renal failure, presumed ATN oliguric Lactic acidosis, anion gap metabolic acidosis, improved Hyperkalemia CVVHD initiated 2/29.  Appreciate nephrology assistance CRRT UF 50-100/hr Hopefully she will be able to achieve a renal recovery Trend BMP, replace lytes PRN  Transaminitis - presumed due to hypotension / low flow.  Improving from 3/1 Trend LFT If not resolving, consider imaging  Anemia - as above, concern for hemorrhage but reassuring eval on exploratory laparotomy Thrombocytopenia - unclear etiology, suspect due to sepsis plus possible component of consumption.  DIC panel with adequate fibrinogen, no schistocytes Received 2 units PRBC,  P Goal hgb>7 in absence of active bleed Trend H/H  Hx HTN, HLD. Lopressor lipitor PRN labetalol  Hx DM. Holding metformin Sliding-scale insulin per protocol  Hx depression, anxiety. Bupropion, venlafaxine being held at this time   Best Practice:  Diet: Trickle Feeds  Pain/Anxiety/Delirium protocol (if indicated): fentanyl gtt / midazolam PRN for goal RASS -1 VAP protocol (if indicated): Continue  DVT prophylaxis: SCDs (no chemical ppx)  GI prophylaxis: PPI Glucose control: SSI Mobility: Bedrest. Code Status: Full  Family Communication: None at bedside  Disposition: Continue ICU level of care   Labs   CBC: Recent Labs  Lab 05/09/2018 1802  05/12/18 1014 05/12/18 1652 16-May-2018 0507 May 16, 2018 0927 05/14/18 0504 05/16/18 0344 05/16/18 0500  WBC 10.1   < > 4.7 6.8 9.5  --  6.8  --  12.4*  NEUTROABS 8.8*  --   --   --   --   --   --   --   --     HGB 8.6*   < > 11.5* 10.9* 10.6* 8.5* 9.7* 9.9* 10.2*  HCT 27.3*   < > 33.0* 31.8* 31.1* 25.0* 28.7* 29.0* 30.6*  MCV 98.2   < > 88.5 88.1 88.9  --  90.8  --  90.5  PLT 71*   < > 87* 80* 76*  --  64*  --  76*   < > = values in this interval not displayed.   Basic Metabolic Panel: Recent Labs  Lab 05/12/18 0415  05/14/18 0504 05/14/18 1554 05/15/18 0503 05/15/18 1622 05/16/18 0344 05/16/18 0500  NA 141   < > 140 139 138 138 139 136  K 4.2   < > 4.5 4.2 4.2 3.8 3.5 3.9  CL 108   < > 109 108 105 107  --  104  CO2 21*   < > --  24  GLUCOSE 106*   < > 88 72 77 101*  --  108*  BUN 39*   < > 31* 18 10 7*  --  9  CREATININE 3.14*   < > 2.70* 1.90* 1.65* 1.43*  --  1.32*  CALCIUM 8.0*   < > 7.4* 7.4* 7.3* 7.1*  --  7.9*  MG 2.1  --  2.4  --  2.4 2.3  --  2.5*  PHOS 6.7*   < > 4.8* 3.4 2.6 2.1*  --  1.8*   < > = values in this interval not displayed.   GFR: Estimated Creatinine Clearance: 41.2 mL/min (A) (by C-G formula based on SCr of 1.32 mg/dL (H)). Recent Labs  Lab 05/12/18 0415  05/12/18 0957  05/12/18 1652 04/30/2018 0507 05/14/18 0504 05/16/18 0500  WBC 4.5   < >  --    < > 6.8 9.5 6.8 12.4*  LATICACIDVEN 3.5*  --  3.8*  --   --  2.3* 1.7  --    < > = values in this interval not displayed.   Liver Function Tests: Recent Labs  Lab 2018-05-23 1955  05/12/18 0415 05/12/18 1014  05/14/18 0504 05/14/18 1554 05/15/18 0503 05/15/18 1622 05/16/18 0500  AST 250*  --  804* 621*  --  667*  --   --   --  409*  ALT 281*  --  565* 520*  --  575*  --   --   --  327*  ALKPHOS 32*  --  38 45  --  135*  --   --   --  202*  BILITOT 0.6  --  1.9* 2.0*  --  4.0*  --   --   --  2.9*  PROT 3.5*  --  4.4* 4.3*  --  4.6*  --   --   --  5.4*  ALBUMIN 1.9*   < > 2.7* 2.4*   < > 1.7* 1.5* 1.5* 1.5* 1.6*  1.6*   < > = values in this interval not displayed.   Recent Labs  Lab May 23, 2018 0508  AMYLASE 166*   Recent Labs  Lab 05/12/18 0957  AMMONIA 20   ABG     Component Value Date/Time   PHART 7.425 05/16/2018 0344   PCO2ART 37.9 05/16/2018 0344   PO2ART 73.0 (L) 05/16/2018 0344   HCO3 24.8 05/16/2018 0344   TCO2 26 05/16/2018 0344   ACIDBASEDEF 5.0 (H) 04/20/2018 0927   O2SAT 95.0 05/16/2018 0344    Coagulation Profile: Recent Labs  Lab 04/26/2018 1207 23-May-2018 1959 05/12/18 0415  INR 1.1 1.5* 1.4*   Cardiac Enzymes: Recent Labs  Lab 05/23/18 1810 2018/05/23 2000  TROPONINI <0.03 <0.03   HbA1C: Hgb A1c MFr Bld  Date/Time Value Ref Range Status  05/02/2018 10:28 AM 6.4 (H) 4.8 - 5.6 % Final    Comment:    (NOTE)         Prediabetes: 5.7 - 6.4         Diabetes: >6.4         Glycemic control for adults with diabetes: <7.0   01/23/2018 12:18 PM 6.4 (H) 4.8 - 5.6 % Final    Comment:             Prediabetes: 5.7 - 6.4          Diabetes: >6.4          Glycemic control for adults with diabetes: <7.0    CBG: Recent Labs  Lab 05/15/18 1615 05/15/18 1943 05/15/18 2355 05/16/18 0340 05/16/18 0752  GLUCAP 97 101* 97 112* 103*   Critical Care Time :  45 minutes  Tessie Fass MSN, AGACNP-BC [place] [facility] 05/16/2018, 10:32 AM  Attending Note:  63 year old female vasculopath who present post aorto-fem bypass that developed ischemic bowel and was intubated.  Patient is very dyssynchronous with the vent this AM with diffuse crackles and was placed on PCV with some improvement.  I reviewed CXR myself, ETT is in a good position.  Discussed with PCCM-NP.  Will hold off weaning today.  Continue fluid negative.  Abx as ordered.  PCCM will continue to follow.  The patient is critically ill with multiple organ systems failure and requires high complexity decision making for assessment and support, frequent evaluation and titration of therapies, application of advanced monitoring technologies and extensive interpretation of multiple databases.   Critical Care Time devoted to patient care services described in  this note is  32  Minutes. This time reflects time of care of this signee Dr Koren Bound. This critical care time does not reflect procedure time, or teaching time or supervisory time of PA/NP/Med student/Med Resident etc but could involve care discussion time.  Alyson Reedy, M.D. [facility]/Critical Care Medicine. Pager: [phone]. After hours pager: [phone].

## 2018-05-16 NOTE — Progress Notes (Signed)
Patient ID: Gwendolyn Pham, female   DOB: 1955-06-05, 63 y.o.   MRN: [id number]  Progress Note    05/16/2018 7:10 AM 3 Days Post-Op  Subjective: Sedated on vent.  Moving but not purposeful.   Vitals:   05/16/18 0600 05/16/18 0700  BP: (!) 124/58 114/67  Pulse: (!) 52 (!) 54  Resp: (!) 21 (!) 25  Temp: 99.5 F (37.5 C) 99.9 F (37.7 C)  SpO2: 98% 99%   Physical Exam: Abdomen soft.  Stoma viable.  VAC dressing to midline intact  CBC    Component Value Date/Time   WBC 12.4 (H) 05/16/2018 0500   RBC 3.38 (L) 05/16/2018 0500   HGB 10.2 (L) 05/16/2018 0500   HGB 15.8 01/23/2018 1218   HCT 30.6 (L) 05/16/2018 0500   HCT 45.9 01/23/2018 1218   PLT 76 (L) 05/16/2018 0500   PLT 253 01/23/2018 1218   MCV 90.5 05/16/2018 0500   MCV 93 01/23/2018 1218   MCH 30.2 05/16/2018 0500   MCHC 33.3 05/16/2018 0500   RDW 16.1 (H) 05/16/2018 0500   RDW 13.7 01/23/2018 1218   LYMPHSABS 1.1 04/25/2018 1802   LYMPHSABS 2.8 01/23/2018 1218   MONOABS 0.3 04/26/2018 1802   EOSABS 0.0 05/12/2018 1802   EOSABS 0.1 01/23/2018 1218   BASOSABS 0.0 04/17/2018 1802   BASOSABS 0.1 01/23/2018 1218    BMET    Component Value Date/Time   NA 136 05/16/2018 0500   NA 137 01/23/2018 1218   K 3.9 05/16/2018 0500   CL 104 05/16/2018 0500   CO2 24 05/16/2018 0500   GLUCOSE 108 (H) 05/16/2018 0500   BUN 9 05/16/2018 0500   BUN 11 01/23/2018 1218   CREATININE 1.32 (H) 05/16/2018 0500   CREATININE 0.66 06/18/2015 1855   CALCIUM 7.9 (L) 05/16/2018 0500   GFRNONAA 43 (L) 05/16/2018 0500   GFRNONAA >89 01/31/2014 1532   GFRAA 50 (L) 05/16/2018 0500   GFRAA >89 01/31/2014 1532    INR    Component Value Date/Time   INR 1.4 (H) 05/12/2018 0415     Intake/Output Summary (Last 24 hours) at 05/16/2018 0710 Last data filed at 05/16/2018 0700 Gross per 24 hour  Intake 2000.7 ml  Output 3548 ml  Net -1547.3 ml     Assessment/Plan:  63 y.o. female tolerating renal replacement.  Essentially no urine  output.  Remains hemodynamically stable.  LFTs remain elevated.  Continue support.  Appreciate help of all consultants.     Larina Earthly, MD FACS [facility] [phone] 05/16/2018 7:10 AM

## 2018-05-17 ENCOUNTER — Inpatient Hospital Stay (HOSPITAL_COMMUNITY): Payer: Medicare Other

## 2018-05-17 DIAGNOSIS — L8990 Pressure ulcer of unspecified site, unspecified stage: Secondary | ICD-10-CM

## 2018-05-17 LAB — CBC
HCT: 28.5 % — ABNORMAL LOW (ref 36.0–46.0)
Hemoglobin: 9.6 g/dL — ABNORMAL LOW (ref 12.0–15.0)
MCH: 30.6 pg (ref 26.0–34.0)
MCHC: 33.7 g/dL (ref 30.0–36.0)
MCV: 90.8 fL (ref 80.0–100.0)
Platelets: 85 10*3/uL — ABNORMAL LOW (ref 150–400)
RBC: 3.14 MIL/uL — ABNORMAL LOW (ref 3.87–5.11)
RDW: 15.9 % — ABNORMAL HIGH (ref 11.5–15.5)
WBC: 12.4 10*3/uL — ABNORMAL HIGH (ref 4.0–10.5)
nRBC: 0.2 % (ref 0.0–0.2)

## 2018-05-17 LAB — GLUCOSE, CAPILLARY
GLUCOSE-CAPILLARY: 138 mg/dL — AB (ref 70–99)
Glucose-Capillary: 96 mg/dL (ref 70–99)
Glucose-Capillary: 111 mg/dL — ABNORMAL HIGH (ref 70–99)
Glucose-Capillary: 112 mg/dL — ABNORMAL HIGH (ref 70–99)
Glucose-Capillary: 129 mg/dL — ABNORMAL HIGH (ref 70–99)
Glucose-Capillary: 125 mg/dL — ABNORMAL HIGH (ref 70–99)
Glucose-Capillary: 120 mg/dL — ABNORMAL HIGH (ref 70–99)
Glucose-Capillary: 112 mg/dL — ABNORMAL HIGH (ref 70–99)

## 2018-05-17 LAB — RENAL FUNCTION PANEL
ANION GAP: 7 (ref 5–15)
Albumin: 1.5 g/dL — ABNORMAL LOW (ref 3.5–5.0)
Albumin: 1.5 g/dL — ABNORMAL LOW (ref 3.5–5.0)
Anion gap: 8 (ref 5–15)
BUN: 11 mg/dL (ref 8–23)
BUN: 14 mg/dL (ref 8–23)
CO2: 25 mmol/L (ref 22–32)
CO2: 24 mmol/L (ref 22–32)
Calcium: 7.3 mg/dL — ABNORMAL LOW (ref 8.9–10.3)
Calcium: 7.3 mg/dL — ABNORMAL LOW (ref 8.9–10.3)
Chloride: 105 mmol/L (ref 98–111)
Chloride: 106 mmol/L (ref 98–111)
Creatinine, Ser: 1.23 mg/dL — ABNORMAL HIGH (ref 0.44–1.00)
Creatinine, Ser: 1.4 mg/dL — ABNORMAL HIGH (ref 0.44–1.00)
GFR calc Af Amer: 54 mL/min — ABNORMAL LOW (ref >60)
GFR calc non Af Amer: 47 mL/min — ABNORMAL LOW (ref >60)
GFR calc non Af Amer: 40 mL/min — ABNORMAL LOW (ref >60)
GFR, EST AFRICAN AMERICAN: 46 mL/min — AB (ref >60)
GLUCOSE: 143 mg/dL — AB (ref 70–99)
Glucose, Bld: 135 mg/dL — ABNORMAL HIGH (ref 70–99)
PHOSPHORUS: 2.2 mg/dL — AB (ref 2.5–4.6)
POTASSIUM: 4 mmol/L (ref 3.5–5.1)
Phosphorus: 2.5 mg/dL (ref 2.5–4.6)
Potassium: 4 mmol/L (ref 3.5–5.1)
Sodium: 138 mmol/L (ref 135–145)
Sodium: 137 mmol/L (ref 135–145)

## 2018-05-17 LAB — CULTURE, BLOOD (ROUTINE X 2)
Culture: NO GROWTH
Culture: NO GROWTH
Special Requests: ADEQUATE
Special Requests: ADEQUATE

## 2018-05-17 MED ORDER — VITAL AF 1.2 CAL PO LIQD
1000.0000 mL | ORAL | Status: DC
  Administered 2018-05-17: 1000 mL

## 2018-05-17 MED ORDER — POTASSIUM PHOSPHATES 15 MMOLE/5ML IV SOLN
15.0000 mmol | Freq: Once | INTRAVENOUS | Status: AC
  Administered 2018-05-17: 15 mmol via INTRAVENOUS
  Filled 2018-05-17: qty 5

## 2018-05-17 MED ORDER — VANCOMYCIN HCL IN DEXTROSE 750-5 MG/150ML-% IV SOLN
750.0000 mg | INTRAVENOUS | Status: DC
  Administered 2018-05-17: 750 mg via INTRAVENOUS
  Filled 2018-05-17: qty 150

## 2018-05-17 MED ORDER — VITAL AF 1.2 CAL PO LIQD: 1000.0000 mL | ORAL | Status: DC

## 2018-05-17 MED ORDER — PRO-STAT SUGAR FREE PO LIQD
60.0000 mL | Freq: Two times a day (BID) | ORAL | Status: DC
  Administered 2018-05-17 – 2018-05-18 (×3): 60 mL
  Filled 2018-05-17 (×2): qty 60

## 2018-05-17 NOTE — Progress Notes (Addendum)
Nutrition Follow Up  DOCUMENTATION CODES:   Not applicable  INTERVENTION:    Vital AF 1.2 at goal rate of 40 ml/hr with Prostat 60 ml BID  Provides 1552 kcals, 132 gm protein, 778 ml free water daily  NUTRITION DIAGNOSIS:   Inadequate oral intake related to inability to eat as evidenced by NPO status, ongoing  GOAL:   Patient will meet greater than or equal to 90% of their needs, progressing   MONITOR:   Vent status, TF tolerance, Labs, Skin, Weight trends, I & O's  ASSESSMENT:   Pt with PMH T2DM, COPD, HTN, HLD and depression. Presented to [facility] for aortobifemoral bypass procedure on 2/26. Found to have extensive necrotic bowel which was all resected 2/27.    2/26 s/p aortobifemoral bypass for aortoiliac occlusive disease w/aortic occlusion 2/27 intubated for respiratory insufficiency 2/27 s/p exp lap; found ischemic bowel/colon 2/29 s/p ileocolonic anastomosis, abd closure & colostomy  3/02 started trickle feeds via OGT  Patient is currently intubated on ventilator support MV: 10.3 L/min Temp (24hrs), Avg:97.9 F (36.6 C), Min:96.6 F (35.9 C), Max:99.5 F (37.5 C)  OGT pulled; hopeful extubation Cortrak placed this AM; tip in stomach  Vital AF 1.2 formula previously infusing at 20 ml/hr via OGT. CCS note reviewed; okay to increase TF to goal rate. Spoke to [employer], Charity fundraiser at bedside today.  Anuric AKI; continues on CVVHD. Labs & medications reviewed. CBG's [id number].  Diet Order:   Diet Order            Diet NPO time specified  Diet effective now             EDUCATION NEEDS:   No education needs have been identified at this time  Skin:  Skin Assessment: Skin Integrity Issues: Skin Integrity Issues:: Other (Comment) Incisions: Abdomen, groin Other: MASD to coccyx & sacrum  Last BM:  3/4   Intake/Output Summary (Last 24 hours) at 05/17/2018 1050 Last data filed at 05/17/2018 1000 Gross per 24 hour  Intake 2439.51 ml  Output 4795 ml  Net -2355.49 ml    Height:   Ht Readings from Last 1 Encounters:  04/29/2018 5\' 4"  (1.626 m)   Weight:   Wt Readings from Last 1 Encounters:  05/17/18 69 kg  04/26/2018          62 kg  Ideal Body Weight:  56.8 kg  BMI:  Body mass index is 26.11 kg/m.  Estimated Nutritional Needs:   Kcal:  1484  Protein:  125-140 gm  Fluid:  per MD  Maureen Chatters, RD, LDN Pager #: [phone] After-Hours Pager #: [phone]

## 2018-05-17 NOTE — Procedures (Signed)
Cortrak  Person Inserting Tube:  Kristain Hu E, RD Tube Type:  Cortrak - 43 inches Tube Location:  Left nare Initial Placement:  Stomach Secured by: Bridle Technique Used to Measure Tube Placement:  Documented cm marking at nare/ corner of mouth Cortrak Secured At:  70 cm    Cortrak Tube Team Note:  Consult received to place a Cortrak feeding tube.   No x-ray is required. RN may begin using tube.   If the tube becomes dislodged please keep the tube and contact the Cortrak team at [email or web address] ([email or web address]) for replacement.  If after hours and replacement cannot be delayed, place a NG tube and confirm placement with an abdominal x-ray.    Corrine Tillis, MS, RD, LDN Office: [phone] Pager: [phone] After Hours/Weekend Pager: [phone]  

## 2018-05-17 NOTE — Clinical Social Work Note (Signed)
Please consult [place] for POA paperwork. Clinical Social Worker will sign off for now as social work intervention is no longer needed. Please consult [place] again if new need arises.   Gwendolyn Pham Gwendolyn Pham 05/17/2018

## 2018-05-17 NOTE — Care Management (Addendum)
Consult for POA for patients sister acknowledged. Please enter a Chaplain consult for POA/advance directive needs.   [name] RN, BSN, NCM-BC, ACM-RN [phone]

## 2018-05-17 NOTE — Progress Notes (Signed)
Patient ID: Gwendolyn Pham, female   DOB: 1955/09/21, 63 y.o.   MRN: [id number] Remains hemodynamically stable.  More alert on vent Updated her sister who is present at the bedside

## 2018-05-17 NOTE — Progress Notes (Signed)
NAME:  Gwendolyn Pham, MRN:  [id number], DOB:  1955-08-22, LOS: 7 ADMISSION DATE:  05/02/2018, CONSULTATION DATE:  06-02-18 REFERRING MD:  Chestine Spore  CHIEF COMPLAINT:  Hypotension   Brief History   Gwendolyn Pham is a 63 y.o. female who was admitted 2/26 for aorto femoral bypass.  2/27 had hypotension and respiratory distress.  Intubated that night and getting transfused 2u PRBC.  Vascular surgery following and planning to take back to OR if hypotension does not resolve with blood products.  History of present illness   Pt is encephelopathic; therefore, this HPI is obtained from chart review. Gwendolyn Pham is a 63 y.o. female who has a PMH as outlined below (see "past medical history").  She presented to [facility] 2/26 for aortofemoral bypass due to occlusive disease.  Procedure was completed without complications.  During afternoon hours of 2/27, she sat up in bed to try and use the commode and shortly thereafter, she became hypotensive.  She was given 3L IVF bolus but BP remained soft.  Hgb had dropped from 11.1 to 8.6.  Vascular surgery was called who felt that pt needed to be intubated while further workup continued.  PCCM was called in consultation.  At the time of our evaluation, pt is awake but is in distress.  She is pale appearing.  SBP in the 80s.  She has had very low UOP, essentially anuric.  She is currently receiving 1u PRBC.  Past Medical History  PVD, HTN, HLD, DM, depression, COPD, asthma, anxiety.  Significant Hospital Events   2/26 > admit, taken to OR for aortofemoral bypass. 2/27 > intubated.  Septic shock 2/27 > emergent exploratory laparotomy, small bowel resection, bilateral colectomy  Consults:  PCCM. General surgery  Procedures:  ETT 2/27 >  Art line 2/27 >   Significant Diagnostic Tests:  CXR 2/27 > mild atelectasis. Renal US 2/28 > Head CT 2/28 >> chronically advanced cerebral white matter disease, no acute abnormality Renal ultrasound 2/28 >> no acute findings, no  postobstructive process CXR 3/3> bilateral lower lobe opacities R>L  Micro Data:  Blood 2/28 >> NGTD   Antimicrobials:  Meropenem 2/28 >>  Vancomycin 2/26 >>3/4  Interim history/subjective:  Tolerating weaning better now with volume down Off pressors  Objective:  Blood pressure (!) 147/64, pulse 68, temperature 97.7 F (36.5 C), resp. rate (!) 30, height  (1.626 m), weight 69 kg, SpO2 100 %.    Vent Mode: PCV FiO2 (%):  [40 %] 40 % Set Rate:  [20 bmp] 20 bmp PEEP:  [5 cmH20] 5 cmH20 Plateau Pressure:  [18 cmH20-23 cmH20] 20 cmH20   Intake/Output Summary (Last 24 hours) at 05/17/2018 0915 Last data filed at 05/17/2018 0900 Gross per 24 hour  Intake 2420.92 ml  Output 4621 ml  Net -2200.08 ml   Filed Weights   05/15/18 0400 05/16/18 0317 05/17/18 0500  Weight: 69 kg 67.4 kg 69 kg    Examination: General: Critically ill appearing female, NAD Neuro: Sedate but arousable, moving all ext to command  HEENT: NCAT, ETT OGT secure. Icteric sclera L>R. Pink, moist mucus membranes  Cardiovascular: RRR, Nl S1/S2 and -M/R/G Lungs: Coarse BS diffusely Abdomen: round, non-distended abdomen, hypoactive bowel sounds. Midline wound packing. Ostomy beefy red.  Musculoskeletal: symmetrical bulk and tone.  Skin: Ecchymosis scattered on abdomen, pubis. Clean, dry, warm.   Resolved Problems:  Hypocalcemia  Assessment & Plan:   Acute respiratory failure -septic shock, ischemic bowel  P Begin PS trials but  will need to d/c sedation prior to consideration of extubation Fluid negative via CRRT AM CXR  ABG in AM Adjust vent for ABG  Bowel ischemia, status post bowel resection 2/27, closure 2/29 Wound care and follow-up care as per CCS recommendations TF at 40 ml/hr, will call nutrition  Peripheral vascular disease, aortobifemoral bypass 2/26 As per vascular surgery management No anticoagulation at this time, defer to VVS with regard to timing SCDs only for VTE ppx   Septic  shock, source consistent with ischemic bowel, possible peritonitis, improved Blood cultures negative so far D/C vanc Continue merrem for now D/C pressors  Acute renal failure, presumed ATN oliguric Lactic acidosis, anion gap metabolic acidosis, improved Hyperkalemia CVVHD initiated 2/29.  Appreciate nephrology assistance CRRT UF 50-100/hr Hopefully she will be able to achieve a renal recovery Trend BMP, replace lytes PRN  Transaminitis - presumed due to hypotension / low flow.  Improving from 3/1 Trend LFT If not resolving, consider imaging  Anemia - as above, concern for hemorrhage but reassuring eval on exploratory laparotomy Thrombocytopenia - unclear etiology, suspect due to sepsis plus possible component of consumption.  DIC panel with adequate fibrinogen, no schistocytes Received 2 units PRBC,  Plan: Goal hgb>7 in absence of active bleed Trend H/H  Hx HTN, HLD. Lopressor and lipitor PRN labetalol but off for now due to hypotension, may need to restart  Hx DM. Holding metformin Sliding-scale insulin per protocol  Hx depression, anxiety. Bupropion, venlafaxine being held at this time  If no extubation in the next few days may need to consider trach  Best Practice:  Diet: Trickle Feeds  Pain/Anxiety/Delirium protocol (if indicated): fentanyl gtt / midazolam PRN for goal RASS -1 VAP protocol (if indicated): Continue  DVT prophylaxis: SCDs (no chemical ppx)  GI prophylaxis: PPI Glucose control: SSI Mobility: Bedrest. Code Status: Full  Family Communication: None at bedside  Disposition: Continue ICU level of care   Labs   CBC: Recent Labs  Lab 2018-06-03 1802  05/12/18 1652 05/04/2018 0507 04/17/2018 0927 05/14/18 0504 05/16/18 0344 05/16/18 0500 05/17/18 0754  WBC 10.1   < > 6.8 9.5  --  6.8  --  12.4* 12.4*  NEUTROABS 8.8*  --   --   --   --   --   --   --   --   HGB 8.6*   < > 10.9* 10.6* 8.5* 9.7* 9.9* 10.2* 9.6*  HCT 27.3*   < > 31.8* 31.1* 25.0*  28.7* 29.0* 30.6* 28.5*  MCV 98.2   < > 88.1 88.9  --  90.8  --  90.5 90.8  PLT 71*   < > 80* 76*  --  64*  --  76* 85*   < > = values in this interval not displayed.   Basic Metabolic Panel: Recent Labs  Lab 05/14/18 0504  05/15/18 0503 05/15/18 1622 05/16/18 0344 05/16/18 0500 05/16/18 1559 05/16/18 2030 05/17/18 0325  NA 140   < > 138 138 139 136 137  --  138  K 4.5   < > 4.2 3.8 3.5 3.9 3.7  --  4.0  CL 109   < > 105 107  --  104 106  --  105  CO2 22   < > 24 25  --  24 24  --  25  GLUCOSE 88   < > 77 101*  --  108* 112*  --  135*  BUN 31*   < > 10 7*  --  9 11  --  11  CREATININE 2.70*   < > 1.65* 1.43*  --  1.32* 1.30*  --  1.23*  CALCIUM 7.4*   < > 7.3* 7.1*  --  7.9* 7.3*  --  7.3*  MG 2.4  --  2.4 2.3  --  2.5*  --  2.5*  --   PHOS 4.8*   < > 2.6 2.1*  --  1.8* 1.4*  --  2.2*   < > = values in this interval not displayed.   GFR: Estimated Creatinine Clearance: 44.6 mL/min (A) (by C-G formula based on SCr of 1.23 mg/dL (H)). Recent Labs  Lab 05/12/18 0415  05/12/18 0957  05/03/2018 0507 05/14/18 0504 05/16/18 0500 05/17/18 0754  WBC 4.5   < >  --    < > 9.5 6.8 12.4* 12.4*  LATICACIDVEN 3.5*  --  3.8*  --  2.3* 1.7  --   --    < > = values in this interval not displayed.   Liver Function Tests: Recent Labs  Lab 2018-05-20 1955  05/12/18 0415 05/12/18 1014  05/14/18 0504  05/15/18 0503 05/15/18 1622 05/16/18 0500 05/16/18 1559 05/17/18 0325  AST 250*  --  804* 621*  --  667*  --   --   --  409*  --   --   ALT 281*  --  565* 520*  --  575*  --   --   --  327*  --   --   ALKPHOS 32*  --  38 45  --  135*  --   --   --  202*  --   --   BILITOT 0.6  --  1.9* 2.0*  --  4.0*  --   --   --  2.9*  --   --   PROT 3.5*  --  4.4* 4.3*  --  4.6*  --   --   --  5.4*  --   --   ALBUMIN 1.9*   < > 2.7* 2.4*   < > 1.7*   < > 1.5* 1.5* 1.6*  1.6* 1.5* 1.5*   < > = values in this interval not displayed.   Recent Labs  Lab 05/20/18 0508  AMYLASE 166*   Recent Labs    Lab 05/12/18 0957  AMMONIA 20   ABG    Component Value Date/Time   PHART 7.425 05/16/2018 0344   PCO2ART 37.9 05/16/2018 0344   PO2ART 73.0 (L) 05/16/2018 0344   HCO3 24.8 05/16/2018 0344   TCO2 26 05/16/2018 0344   ACIDBASEDEF 5.0 (H) 04/18/2018 0927   O2SAT 95.0 05/16/2018 0344    Coagulation Profile: Recent Labs  Lab 04/22/2018 1207 05-20-18 1959 05/12/18 0415  INR 1.1 1.5* 1.4*   Cardiac Enzymes: Recent Labs  Lab 05/20/18 1810 05/20/18 2000  TROPONINI <0.03 <0.03   HbA1C: Hgb A1c MFr Bld  Date/Time Value Ref Range Status  05/02/2018 10:28 AM 6.4 (H) 4.8 - 5.6 % Final    Comment:    (NOTE)         Prediabetes: 5.7 - 6.4         Diabetes: >6.4         Glycemic control for adults with diabetes: <7.0   01/23/2018 12:18 PM 6.4 (H) 4.8 - 5.6 % Final    Comment:             Prediabetes: 5.7 - 6.4  Diabetes: >6.4          Glycemic control for adults with diabetes: <7.0    CBG: Recent Labs  Lab 05/16/18 1549 05/16/18 1952 05/16/18 2338 05/17/18 0330 05/17/18 0752  GLUCAP 96 109* 100* 111* 112*   The patient is critically ill with multiple organ systems failure and requires high complexity decision making for assessment and support, frequent evaluation and titration of therapies, application of advanced monitoring technologies and extensive interpretation of multiple databases.   Critical Care Time devoted to patient care services described in this note is  32  Minutes. This time reflects time of care of this signee Dr Koren Bound. This critical care time does not reflect procedure time, or teaching time or supervisory time of PA/NP/Med student/Med Resident etc but could involve care discussion time.  Alyson Reedy, M.D. [facility] Pulmonary/Critical Care Medicine. Pager: [phone]. After hours pager: [phone].

## 2018-05-17 NOTE — Consult Note (Signed)
WOC Nurse wound follow up Wound type: surgical  Wound bed: 100% subcutaneous tissue  Drainage scant serosanguinous, no odor  Periwound: intact  Dressing procedure/placement/frequency:   Removed old NPWT dressing Periwound skin protected with skin barrier wipe  Filled wound with 1 piecesof black foam. Sealed NPWT dressing at HG  Patient sedated on vent.  Patient tolerated procedure well  WOC nurse will continue to provide NPWT dressing changed M-W-F with ostomy assessment.  Discussed POC with bedside nurse.  Extra ostomy supplies at bedside.    Ioan Landini [facility], CNS, [employer] [phone]

## 2018-05-17 NOTE — Progress Notes (Signed)
Subjective: Interval History: none.. Moving all 4 extremities.  No purposeful movement to command  Objective: Vital signs in last 24 hours: Temp:  [96.6 F (35.9 C)-99.5 F (37.5 C)] 97.9 F (36.6 C) (03/04 0800) Pulse Rate:  [38-108] 54 (03/04 0800) Resp:  [16-25] 20 (03/04 0800) BP: (100-148)/(48-101) 127/61 (03/04 0800) SpO2:  [100 %] 100 % (03/04 0800) Arterial Line BP: (105-183)/(39-78) 125/59 (03/04 0800) FiO2 (%):  [40 %] 40 % (03/04 0717) Weight:  [69 kg] 69 kg (03/04 0500)  Intake/Output from previous day: 03/03 0701 - 03/04 0700 In: 2463.1 [I.V.:1497; NG/GT:450; IV Piggyback:516.1] Out: 4574 [Stool:250] Intake/Output this shift: Total I/O In: 65 [I.V.:65] Out: 360 [Other:160; Stool:200]  Abdomen soft.  Abdominal wound VAC in place.  Stoma appears viable.  Femoral incisions healing.  2+ dorsalis pedis pulses bilaterally  Lab Results: Recent Labs    05/16/18 0344 05/16/18 0500  WBC  --  12.4*  HGB 9.9* 10.2*  HCT 29.0* 30.6*  PLT  --  76*   BMET Recent Labs    05/16/18 1559 05/17/18 0325  NA 137 138  K 3.7 4.0  CL 106 105  CO2 24 25  GLUCOSE 112* 135*  BUN 11 11  CREATININE 1.30* 1.23*  CALCIUM 7.3* 7.3*    Studies/Results: Ct Head Wo Contrast  Result Date: 05/12/2018 CLINICAL DATA:  63 year old female status post bilateral femoral bypass graft with hypotension. Being evaluated for possible abnormal pupils on exam. EXAM: CT HEAD WITHOUT CONTRAST TECHNIQUE: Contiguous axial images were obtained from the base of the skull through the vertex without intravenous contrast. COMPARISON:  Head CT 04/03/2012. FINDINGS: Brain: Chronic bilateral cerebral white matter hypodensity most pronounced along the frontal horns, and involving the anterior deep white matter capsules. There is a small area of cortical hypodensity in the right middle frontal gyrus (series 3, image 22) some of which seems to have been present in 2014 (series 2 image 22 at that time). There is  no associated mass effect. No acute intracranial hemorrhage identified. No midline shift, mass effect, or evidence of intracranial mass lesion. No ventriculomegaly. No other cerebral cortical abnormality identified. Vascular: Calcified atherosclerosis at the skull base. No suspicious intracranial vascular hyperdensity. Skull: No acute osseous abnormality identified. Sinuses/Orbits: Intubated. Mild to moderate paranasal sinus mucosal thickening with scattered bubbly opacity. The tympanic cavities and mastoids remain well pneumatized. Other: Negative orbit and scalp soft tissues. IMPRESSION: 1. Evidence of chronically advanced cerebral white matter disease. Probably chronic cortical encephalomalacia in the right middle frontal gyrus, some of which was evident on a 2014 comparison. No definite acute intracranial abnormality. 2. Study discussed by telephone with Neurology Dr. Wilford Corner on 05/12/2018 at 0923 hours. Electronically Signed   By: Odessa Fleming M.D.   On: 05/12/2018 09:34   US Renal  Result Date: 05/12/2018 CLINICAL DATA:  Acute renal injury. EXAM: RENAL / URINARY TRACT ULTRASOUND COMPLETE COMPARISON:  KUB 04/16/2018. FINDINGS: Right Kidney: Renal measurements: 9.6 x 4.5 x 5.5 cm = volume: 124.1 mL . Echogenicity within normal limits. No mass or hydronephrosis visualized. Lower pole not visualized due to overlying bowel gas. Left Kidney: Renal measurements: 9.0 x 4.8 x 4.1 cm = volume: 92.5 mL. Mild focal left renal thinning noted suggesting scarring. Echogenicity within normal limits. No mass or hydronephrosis visualized. Bladder: Foley catheter noted in the bladder.  Tiny left pleural effusion. IMPRESSION: 1 mild focal left renal thinning noted suggesting scarring. No acute abnormality identified. No hydronephrosis. Foley noted in the bladder. No bladder distention.  Electronically Signed   By: Maisie Fus  Register   On: 05/12/2018 12:21   Dg Chest Port 1 View  Result Date: 05/17/2018 CLINICAL DATA:  Check  endotracheal tube placement EXAM: PORTABLE CHEST 1 VIEW COMPARISON:  05/16/2018 FINDINGS: Cardiac shadow is within normal limits. Endotracheal tube and gastric catheter are noted in satisfactory position. Temporary dialysis catheter is noted in the left jugular vein with a sheath in the right jugular vein. These are stable from the prior exam. Bibasilar atelectasis is noted but slightly improved when compared with the prior exam. No pneumothorax or sizable effusion is seen. IMPRESSION: 1. Slight improvement in bibasilar atelectasis. 2. Tubes and lines as described. 3. Lungs otherwise clear. Electronically Signed   By: Alcide Clever M.D.   On: 05/17/2018 08:21   Dg Chest Port 1 View  Result Date: 05/16/2018 CLINICAL DATA:  Respiratory failure. Intubated patient. Follow-up exam. EXAM: PORTABLE CHEST 1 VIEW COMPARISON:  05/14/2018 and older exams. FINDINGS: Prominent bronchovascular markings and mild bilateral interstitial thickening is similar to the most recent prior exam. There are bilateral lung base opacities, greater on the right, similar to the prior exam, consistent with a combination of atelectasis and small effusions. No new lung abnormalities. No pneumothorax. Endotracheal tube, nasal/orogastric tube, left internal jugular dual lumen central venous catheter and right internal jugular catheter are stable from the prior exam. IMPRESSION: 1. No significant change from the most recent prior study. 2. Mild interstitial edema small pleural effusions and lung base atelectasis, greater on the right. No new lung abnormalities. 3. Support apparatus is stable and well positioned. Electronically Signed   By: Amie Portland M.D.   On: 05/16/2018 09:16   Dg Chest Port 1 View  Result Date: 05/14/2018 CLINICAL DATA:  Acute respiratory failure EXAM: PORTABLE CHEST 1 VIEW COMPARISON:  04/18/2018 FINDINGS: Endotracheal tube, right jugular sheath, left jugular central line and gastric catheter are again noted and stable.  Cardiac shadow is within normal limits. Bibasilar opacities with associated right pleural effusion are seen slightly increased from the prior exam. No bony abnormality is noted. IMPRESSION: Slight increase in bibasilar opacities. Tubes and lines as described above. Electronically Signed   By: Alcide Clever M.D.   On: 05/14/2018 07:17   Dg Chest Port 1 View  Result Date: 04/28/2018 CLINICAL DATA:  Encounter for central line placement EXAM: PORTABLE CHEST 1 VIEW COMPARISON:  05/12/2018 FINDINGS: RIGHT-sided IJ sheath, tip overlying the superior vena cava. Endotracheal tube is in place with tip approximately 5.9 centimeters above the carina. LEFT IJ central line(s) tip overlies the superior vena cava. Nasogastric tube is in place, tip coiled within the proximal stomach. Heart size is normal. There are bibasilar opacities partially obscuring the hemidiaphragms and probably unchanged. No pneumothorax. IMPRESSION: 1. Lines and tubes as described. 2. Persistent bibasilar opacities. 3. No pneumothorax. Electronically Signed   By: Norva Pavlov M.D.   On: 04/21/2018 19:26   Portable Chest Xray  Result Date: 05/12/2018 CLINICAL DATA:  63 year old female status post exploratory laparotomy for ischemic bowel and vascular surgery, aortic bypass. EXAM: PORTABLE CHEST 1 VIEW COMPARISON:  05/17/2018 and earlier. FINDINGS: Portable AP semi upright view at 0543 hours. Endotracheal tube tip in good position between the clavicles and carina. Enteric tube courses to the left abdomen, tip not included. Stable right IJ introducer sheath. Larger lung volumes. Right greater than left veiling lung base opacity. Mediastinal contours are within normal limits. No pneumothorax. Decreased pulmonary vascularity and interstitial opacity. Linear platelike opacity in the mid  and lower lungs. Paucity of bowel gas in the upper abdomen. No acute osseous abnormality identified. IMPRESSION: 1. ETT tip in good position.  Otherwise stable lines  and tubes. 2. Right greater than left pleural effusions suspected with atelectasis. 3. Decreased pulmonary vascularity/interstitial edema. Electronically Signed   By: Odessa FlemingH  Hall M.D.   On: 05/12/2018 09:36   Dg Chest Port 1 View  Result Date: 09-13-18 CLINICAL DATA:  Postop vascular surgery.  Intubated. EXAM: PORTABLE CHEST 1 VIEW COMPARISON:  None. FINDINGS: Endotracheal tube tip is 1 cm above the carina. NG tube coils in the stomach. Bilateral pleural effusions with bibasilar atelectasis. Diffuse interstitial prominence throughout the lungs vascular congestion. Findings concerning for Ekam Bonebrake interstitial edema. IMPRESSION: Endotracheal tube 1 cm above the carina. Layering bilateral effusions with bibasilar atelectasis. Diffuse interstitial prominence, likely interstitial edema. Electronically Signed   By: Charlett NoseKevin  Dover M.D.   On: 007-01-20 20:11   Dg Chest Port 1 View  Result Date: 09-13-18 CLINICAL DATA:  63 year old female with history of shortness of breath EXAM: PORTABLE CHEST 1 VIEW COMPARISON:  007-01-20, 04/19/2018 FINDINGS: Cardiomediastinal silhouette unchanged in size and contour. Low lung volumes. Minimal interlobular septal thickening. No pleural effusion or pneumothorax. Right IJ sheath remains with interval removal of the Swan-Ganz catheter. No confluent airspace disease. IMPRESSION: Low lung volumes with either minimal edema or atelectasis. Interval removal of the Swan-Ganz catheter with persisting right IJ sheath. Electronically Signed   By: Gilmer MorJaime  Wagner D.O.   On: 007-01-20 09:42   Dg Chest Port 1 View  Result Date: 09-13-18 CLINICAL DATA:  63 year old female with Swan-Ganz placement. EXAM: PORTABLE CHEST 1 VIEW COMPARISON:  Chest radiograph dated 04/21/2018 FINDINGS: There has been interval retraction of the Swan-Ganz with tip likely in the main pulmonary trunk. Minimal bibasilar atelectasis. No focal consolidation, pleural effusion, or pneumothorax. The cardiac silhouette is  within normal limits. No acute osseous pathology. IMPRESSION: Interval retraction of the Swan-Ganz with tip likely in the main pulmonary trunk. Electronically Signed   By: Elgie CollardArash  Radparvar M.D.   On: 007-01-20 02:14   Dg Chest Port 1 View  Result Date: 04/15/2018 CLINICAL DATA:  Initial postoperative evaluation status posts aortobifemoral graft placement. EXAM: PORTABLE CHEST 1 VIEW COMPARISON:  Prior CT from 04/12/2018 FINDINGS: Transverse heart size within normal limits. Mediastinal silhouette normal. Right IJ approach Swan-Ganz catheter in place with tip overlying the main pulmonary outflow tract. Lungs mildly hypoinflated. Mild to moderate diffuse pulmonary interstitial edema. Superimposed mild bibasilar atelectasis. No other consolidative opacity. Probable small left pleural effusion. No pneumothorax. No acute osseous finding. IMPRESSION: 1. Right IJ approach Swan-Ganz catheter in place with tip overlying the main pulmonary outflow tract. 2. Mild to moderate diffuse pulmonary interstitial edema with probable small left pleural effusion. 3. Superimposed mild bibasilar atelectasis. Electronically Signed   By: Rise MuBenjamin  McClintock M.D.   On: 04/29/2018 15:41   Dg Abd Portable 1v  Result Date: 05/05/2018 CLINICAL DATA:  Initial postoperative evaluation status post aortobifemoral graft placement. EXAM: PORTABLE ABDOMEN - 1 VIEW COMPARISON:  Prior CT from 04/12/2018 FINDINGS: Surgical clips overlie the mid abdomen, consistent with interval aortobifemoral graft. Graft itself not visualized. Bowel gas pattern is nonobstructive. Large volume retained colonic stool noted. No visible free air on this single supine view of the abdomen. Punctate calcific densities overlying the left renal shadow could reflect nephrolithiasis or possibly be vascular in nature. Prominent atherosclerotic change noted within the iliac arteries bilaterally. Levoscoliosis with mild multilevel degenerative spondylolysis. IMPRESSION: 1.  Surgical clips  overlying the mid abdomen, consistent with provided history of interval aortobifemoral graft. No appreciable adverse features. 2. Large volume retained colonic stool, suggesting constipation. 3. Calcific densities overlying the left renal shadow which could reflect nephrolithiasis or possibly vascular calcifications. Electronically Signed   By: Rise Mu M.D.   On: 04/15/2018 15:45   Anti-infectives: Anti-infectives (From admission, onward)   Start     Dose/Rate Route Frequency Ordered Stop   05/17/18 1130  vancomycin (VANCOCIN) IVPB 750 mg/150 ml premix     750 mg 150 mL/hr over 60 Minutes Intravenous Every 24 hours 05/17/18 0733     05/16/18 0900  vancomycin (VANCOCIN) IVPB 750 mg/150 ml premix  Status:  Discontinued     750 mg 150 mL/hr over 60 Minutes Intravenous Every 24 hours 05/16/18 0745 05/17/18 0733   05/15/18 0800  vancomycin (VANCOCIN) IVPB 750 mg/150 ml premix  Status:  Discontinued     750 mg 150 mL/hr over 60 Minutes Intravenous Every 24 hours 05/14/18 0916 05/16/18 0745   04/15/2018 2200  meropenem (MERREM) 1 g in sodium chloride 0.9 % 100 mL IVPB     1 g 200 mL/hr over 30 Minutes Intravenous Every 12 hours 04/23/2018 1714     05/12/18 1115  vancomycin (VANCOCIN) 1,250 mg in sodium chloride 0.9 % 250 mL IVPB     1,250 mg 166.7 mL/hr over 90 Minutes Intravenous  Once 05/12/18 1109 05/12/18 1348   05/12/18 1115  meropenem (MERREM) 500 mg in sodium chloride 0.9 % 100 mL IVPB  Status:  Discontinued     500 mg 200 mL/hr over 30 Minutes Intravenous Every 12 hours 05/12/18 1109 04/29/2018 1714   05/12/18 1109  vancomycin variable dose per unstable renal function (pharmacist dosing)  Status:  Discontinued      Does not apply See admin instructions 05/12/18 1109 05/14/18 0900   04/27/2018 1800  vancomycin (VANCOCIN) IVPB 1000 mg/200 mL premix     1,000 mg 200 mL/hr over 60 Minutes Intravenous Every 12 hours 05/03/2018 1357 05/06/2018 1759   04/18/2018 0540  vancomycin  (VANCOCIN) IVPB 1000 mg/200 mL premix     1,000 mg 200 mL/hr over 60 Minutes Intravenous 60 min pre-op 04/17/2018 0540 04/18/2018 1504      Assessment/Plan: s/p Procedure(s): EXPLORATORY LAPAROTOMY ILEOCOLONIC ANASTOMOS CLOSURE OF ABDOMEN (N/A) COLOSTOMY (N/A) Continue support.  No urine output.  Continue CRRT.  Appreciate assistance of all consultants.   LOS: 7 days   Breton Berns 05/17/2018, 8:38 AM

## 2018-05-17 NOTE — Progress Notes (Addendum)
[facility] NEPHROLOGY PROGRESS NOTE  Assessment/ Plan: Pt is a 63 y.o. yo female admitted for aortofemoral bypass, had hypotension and respiratory failure, necrotic bowel requiring ex lap, intubated anuric AKI requiring CRRT.  #Oliguric AKI presumably due to ischemic ATN due to sepsis, ABLA and necrotic bowel: No significant urine output.  She is on CRRT since 2/29 with temporary catheter.  Continue current CRRT prescription with 100 cc an hour net UF goal, .  Monitor urine output and electrolytes.  Avoid nephrotoxins, IV contrast.  #Hypophosphatemia: Repleted phosphorus.  #Acute respiratory failure on mechanical ventilation, per PCCM  #Septic shock due to ischemic and necrotic bowel: Off pressors this morning.  #PAD status post aortobifem bypass on 05/08/2018.  #Ischemic bowel status post small bowel resection and right and left colectomy with colostomy: Per surgery team.  #Thrombocytopenia: no systemic heparin with CVVH.  Subjective: Seen and examined in [facility].  Remains oliguric, on vent, off pressor, moving arms.  No other new event. Objective Vital signs in last 24 hours: Vitals:   05/17/18 0600 05/17/18 0700 05/17/18 0717 05/17/18 0800  BP: (!) 114/58 126/83 126/83 127/61  Pulse: 61 63 (!) 107 (!) 54  Resp: 20 (!) 21 (!) 21 20  Temp: (!) 97.3 F (36.3 C) (!) 97.5 F (36.4 C)  97.9 F (36.6 C)  TempSrc: Core (Comment)     SpO2: 100% 100% 100% 100%  Weight:      Height:       Weight change: 1.6 kg  Intake/Output Summary (Last 24 hours) at 05/17/2018 0817 Last data filed at 05/17/2018 0800 Gross per 24 hour  Intake 2470.62 ml  Output 4791 ml  Net -2320.38 ml       Labs: Basic Metabolic Panel: Recent Labs  Lab 05/16/18 0500 05/16/18 1559 05/17/18 0325  NA 136 137 138  K 3.9 3.7 4.0  CL 104 106 105  CO2 _0 GLUCOSE 108* 112* 135*  BUN _1 CREATININE 1.32* 1.30* 1.23*  CALCIUM 7.9* 7.3* 7.3*  PHOS 1.8* 1.4* 2.2*   Liver Function  Tests: Recent Labs  Lab 05/12/18 1014  05/14/18 0504  05/16/18 0500 05/16/18 1559 05/17/18 0325  AST 621*  --  667*  --  409*  --   --   ALT 520*  --  575*  --  327*  --   --   ALKPHOS 45  --  135*  --  202*  --   --   BILITOT 2.0*  --  4.0*  --  2.9*  --   --   PROT 4.3*  --  4.6*  --  5.4*  --   --   ALBUMIN 2.4*   < > 1.7*   < > 1.6*  1.6* 1.5* 1.5*   < > = values in this interval not displayed.   Recent Labs  Lab 04/23/2018 0508  AMYLASE 166*   Recent Labs  Lab 05/12/18 0957  AMMONIA 20   CBC: Recent Labs  Lab 04/28/2018 1802  05/12/18 1014 05/12/18 1652 04/29/2018 0507  05/14/18 0504 05/16/18 0344 05/16/18 0500  WBC 10.1   < > 4.7 6.8 9.5  --  6.8  --  12.4*  NEUTROABS 8.8*  --   --   --   --   --   --   --   --   HGB 8.6*   < > 11.5* 10.9* 10.6*   < > 9.7* 9.9* 10.2*  HCT 27.3*   < >  33.0* 31.8* 31.1*   < > 28.7* 29.0* 30.6*  MCV 98.2   < > 88.5 88.1 88.9  --  90.8  --  90.5  PLT 71*   < > 87* 80* 76*  --  64*  --  76*   < > = values in this interval not displayed.   Cardiac Enzymes: Recent Labs  Lab 04/30/2018 1810 04/22/2018 2000  TROPONINI <0.03 <0.03   CBG: Recent Labs  Lab 05/16/18 1549 05/16/18 1952 05/16/18 2338 05/17/18 0330 05/17/18 0752  GLUCAP 96 109* 100* 111* 112*    Iron Studies: No results for input(s): IRON, TIBC, TRANSFERRIN, FERRITIN in the last 72 hours. Studies/Results: Dg Chest Port 1 View  Result Date: 05/16/2018 CLINICAL DATA:  Respiratory failure. Intubated patient. Follow-up exam. EXAM: PORTABLE CHEST 1 VIEW COMPARISON:  05/14/2018 and older exams. FINDINGS: Prominent bronchovascular markings and mild bilateral interstitial thickening is similar to the most recent prior exam. There are bilateral lung base opacities, greater on the right, similar to the prior exam, consistent with a combination of atelectasis and small effusions. No new lung abnormalities. No pneumothorax. Endotracheal tube, nasal/orogastric tube, left internal  jugular dual lumen central venous catheter and right internal jugular catheter are stable from the prior exam. IMPRESSION: 1. No significant change from the most recent prior study. 2. Mild interstitial edema small pleural effusions and lung base atelectasis, greater on the right. No new lung abnormalities. 3. Support apparatus is stable and well positioned. Electronically Signed   By: Lajean Manes M.D.   On: 05/16/2018 09:16    Medications: Infusions: .  prismasol BGK 4/2.5 500 mL/hr at 05/17/18 0817  .  prismasol BGK 4/2.5 300 mL/hr at 05/16/18 1028  . sodium chloride 10 mL/hr at 05/14/18 0700  . dextrose 5 % and 0.9% NaCl 50 mL/hr at 05/17/18 0044  . fentaNYL infusion INTRAVENOUS 180 mcg/hr (05/16/18 2309)  . meropenem (MERREM) IV 1 g (05/17/18 0755)  . prismasol BGK 4/2.5 1,500 mL/hr at 05/16/18 2025  . vancomycin      Scheduled Medications: . atorvastatin  80 mg Per Tube Daily  . chlorhexidine gluconate (MEDLINE KIT)  15 mL Mouth Rinse BID  . feeding supplement (VITAL AF 1.2 CAL)  1,000 mL Per Tube Q24H  . Gerhardt's butt cream   Topical BID  . insulin aspart  0-9 Units Subcutaneous Q4H  . mouth rinse  15 mL Mouth Rinse 10 times per day  . metoprolol tartrate  5 mg Intravenous Q8H  . pantoprazole (PROTONIX) IV  40 mg Intravenous Daily    have reviewed scheduled and prn medications.  Physical Exam: General: Intubated, sedated, moving extremities Heart: Regular rate rhythm S1-S2 normal, no rubs Lungs: Coarse breath sound bilateral, no wheezing Abdomen: Ex lap incision site with dressing applied, no bleeding Extremities:No edema Dialysis Access: Temporary catheter.  No bleeding   Prasad  05/17/2018,8:17 AM  LOS: 7 days

## 2018-05-17 NOTE — Progress Notes (Signed)
[facility]/Trauma Progress Note  4 Days Post-Op   Assessment/Plan AIOD with aortic occlusion - S/PAortobifemoral bypass wth 14 x 8 Hemashield graft, 05/03/2018, Dr. Arbie Cookey Ischemic distal small bowel, right and left colon ischemia: - S/Pex lap with right and left colon resection, small bowel resection 2/27 Magnus Ivan)- S/Pileo-transverse colon anastomosis and left end colostomy 2/29 Janee Morn) - midline with wound vac  OQH:[id number] TF's VTE: SCD's,heparin for CRRT TW:SFKCLE & Vanc, WBC up to 12.4 form 6 two days ago, monitor, afebrile, CBC pending Foley:yes, CRRT Follow up:TBD  DISPO:increased TF, wound vac. CBC pending   LOS: 7 days    Subjective: CC: ischemic bowel  Awake on vent, not following commands but more alert today and purposeful. Attempting to wean off vent this am.   Objective: Vital signs in last 24 hours: Temp:  [96.6 F (35.9 C)-99.5 F (37.5 C)] 97.5 F (36.4 C) (03/04 0700) Pulse Rate:  [38-108] 107 (03/04 0717) Resp:  [16-25] 21 (03/04 0717) BP: (100-148)/(48-101) 126/83 (03/04 0717) SpO2:  [100 %] 100 % (03/04 0717) Arterial Line BP: (105-183)/(39-78) 160/65 (03/04 0700) FiO2 (%):  [40 %] 40 % (03/04 0717) Weight:  [69 kg] 69 kg (03/04 0500) Last BM Date: 05/16/18  Intake/Output from previous day: 03/03 0701 - 03/04 0700 In: 2463.1 [I.V.:1497; NG/GT:450; IV Piggyback:516.1] Out: 4574 [Stool:250] Intake/Output this shift: No intake/output data recorded.  PE: Gen:On vent, will open eyes to name, not following commands but purposeful, ill appearing Card: RRR Pulm:On vent Abd: Soft, ND,noBSappreciated,stoma appears viable with gas and brown liquid stool in bag. Midline with wound vac in place Skin: warm and dry   Anti-infectives: Anti-infectives (From admission, onward)   Start     Dose/Rate Route Frequency Ordered Stop   05/17/18 1130  vancomycin (VANCOCIN) IVPB 750 mg/150 ml premix     750 mg 150 mL/hr  over 60 Minutes Intravenous Every 24 hours 05/17/18 0733     05/16/18 0900  vancomycin (VANCOCIN) IVPB 750 mg/150 ml premix  Status:  Discontinued     750 mg 150 mL/hr over 60 Minutes Intravenous Every 24 hours 05/16/18 0745 05/17/18 0733   05/15/18 0800  vancomycin (VANCOCIN) IVPB 750 mg/150 ml premix  Status:  Discontinued     750 mg 150 mL/hr over 60 Minutes Intravenous Every 24 hours 05/14/18 0916 05/16/18 0745   05/09/2018 2200  meropenem (MERREM) 1 g in sodium chloride 0.9 % 100 mL IVPB     1 g 200 mL/hr over 30 Minutes Intravenous Every 12 hours 05/06/2018 1714     05/12/18 1115  vancomycin (VANCOCIN) 1,250 mg in sodium chloride 0.9 % 250 mL IVPB     1,250 mg 166.7 mL/hr over 90 Minutes Intravenous  Once 05/12/18 1109 05/12/18 1348   05/12/18 1115  meropenem (MERREM) 500 mg in sodium chloride 0.9 % 100 mL IVPB  Status:  Discontinued     500 mg 200 mL/hr over 30 Minutes Intravenous Every 12 hours 05/12/18 1109 04/21/2018 1714   05/12/18 1109  vancomycin variable dose per unstable renal function (pharmacist dosing)  Status:  Discontinued      Does not apply See admin instructions 05/12/18 1109 05/14/18 0900   05/05/2018 1800  vancomycin (VANCOCIN) IVPB 1000 mg/200 mL premix     1,000 mg 200 mL/hr over 60 Minutes Intravenous Every 12 hours 04/28/2018 1357 04/28/2018 1759   04/25/2018 0540  vancomycin (VANCOCIN) IVPB 1000 mg/200 mL premix     1,000 mg 200 mL/hr over 60 Minutes Intravenous 60 min  pre-op 05-24-18 0540 May 24, 2018 1504      Lab Results:  Recent Labs    05/16/18 0344 05/16/18 0500  WBC  --  12.4*  HGB 9.9* 10.2*  HCT 29.0* 30.6*  PLT  --  76*   BMET Recent Labs    05/16/18 1559 05/17/18 0325  NA 137 138  K 3.7 4.0  CL 106 105  CO2 24 25  GLUCOSE 112* 135*  BUN 11 11  CREATININE 1.30* 1.23*  CALCIUM 7.3* 7.3*   PT/INR No results for input(s): LABPROT, INR in the last 72 hours. CMP     Component Value Date/Time   NA 138 05/17/2018 0325   NA 137 01/23/2018 1218    K 4.0 05/17/2018 0325   CL 105 05/17/2018 0325   CO2 25 05/17/2018 0325   GLUCOSE 135 (H) 05/17/2018 0325   BUN 11 05/17/2018 0325   BUN 11 01/23/2018 1218   CREATININE 1.23 (H) 05/17/2018 0325   CREATININE 0.66 06/18/2015 1855   CALCIUM 7.3 (L) 05/17/2018 0325   PROT 5.4 (L) 05/16/2018 0500   PROT 7.2 01/23/2018 1218   ALBUMIN 1.5 (L) 05/17/2018 0325   ALBUMIN 4.3 01/23/2018 1218   AST 409 (H) 05/16/2018 0500   ALT 327 (H) 05/16/2018 0500   ALKPHOS 202 (H) 05/16/2018 0500   BILITOT 2.9 (H) 05/16/2018 0500   BILITOT 0.2 01/23/2018 1218   GFRNONAA 47 (L) 05/17/2018 0325   GFRNONAA >89 01/31/2014 1532   GFRAA 54 (L) 05/17/2018 0325   GFRAA >89 01/31/2014 1532   Lipase  No results found for: LIPASE  Studies/Results: Dg Chest Port 1 View  Result Date: 05/16/2018 CLINICAL DATA:  Respiratory failure. Intubated patient. Follow-up exam. EXAM: PORTABLE CHEST 1 VIEW COMPARISON:  05/14/2018 and older exams. FINDINGS: Prominent bronchovascular markings and mild bilateral interstitial thickening is similar to the most recent prior exam. There are bilateral lung base opacities, greater on the right, similar to the prior exam, consistent with a combination of atelectasis and small effusions. No new lung abnormalities. No pneumothorax. Endotracheal tube, nasal/orogastric tube, left internal jugular dual lumen central venous catheter and right internal jugular catheter are stable from the prior exam. IMPRESSION: 1. No significant change from the most recent prior study. 2. Mild interstitial edema small pleural effusions and lung base atelectasis, greater on the right. No new lung abnormalities. 3. Support apparatus is stable and well positioned. Electronically Signed   By: Amie Portland M.D.   On: 05/16/2018 09:16      Jerre Simon , [facility] Surgery 05/17/2018, 7:34 AM  Pager: [phone] Mon-Wed, Friday 7:00am-4:30pm Thurs 7am-11:30am  Consults: [phone]

## 2018-05-18 ENCOUNTER — Inpatient Hospital Stay (HOSPITAL_COMMUNITY): Payer: Medicare Other

## 2018-05-18 DIAGNOSIS — R579 Shock, unspecified: Secondary | ICD-10-CM

## 2018-05-18 LAB — HEPATIC FUNCTION PANEL
ALBUMIN: 1.6 g/dL — AB (ref 3.5–5.0)
ALT: 172 U/L — ABNORMAL HIGH (ref 0–44)
AST: 222 U/L — ABNORMAL HIGH (ref 15–41)
Alkaline Phosphatase: 154 U/L — ABNORMAL HIGH (ref 38–126)
Bilirubin, Direct: 0.9 mg/dL — ABNORMAL HIGH (ref 0.0–0.2)
Indirect Bilirubin: 1.1 mg/dL — ABNORMAL HIGH (ref 0.3–0.9)
Total Bilirubin: 2 mg/dL — ABNORMAL HIGH (ref 0.3–1.2)
Total Protein: 5.5 g/dL — ABNORMAL LOW (ref 6.5–8.1)

## 2018-05-18 LAB — RENAL FUNCTION PANEL
Albumin: 1.6 g/dL — ABNORMAL LOW (ref 3.5–5.0)
Albumin: 1.4 g/dL — ABNORMAL LOW (ref 3.5–5.0)
Anion gap: 13 (ref 5–15)
Anion gap: 9 (ref 5–15)
BUN: 15 mg/dL (ref 8–23)
BUN: 22 mg/dL (ref 8–23)
CALCIUM: 8 mg/dL — AB (ref 8.9–10.3)
CO2: 23 mmol/L (ref 22–32)
CO2: 19 mmol/L — ABNORMAL LOW (ref 22–32)
Calcium: 6.5 mg/dL — ABNORMAL LOW (ref 8.9–10.3)
Chloride: 101 mmol/L (ref 98–111)
Chloride: 110 mmol/L (ref 98–111)
Creatinine, Ser: 1.22 mg/dL — ABNORMAL HIGH (ref 0.44–1.00)
Creatinine, Ser: 1.33 mg/dL — ABNORMAL HIGH (ref 0.44–1.00)
GFR calc Af Amer: 55 mL/min — ABNORMAL LOW (ref >60)
GFR calc non Af Amer: 47 mL/min — ABNORMAL LOW (ref >60)
GFR calc non Af Amer: 42 mL/min — ABNORMAL LOW (ref >60)
GFR, EST AFRICAN AMERICAN: 49 mL/min — AB (ref >60)
Glucose, Bld: 139 mg/dL — ABNORMAL HIGH (ref 70–99)
Glucose, Bld: 134 mg/dL — ABNORMAL HIGH (ref 70–99)
Phosphorus: 1.5 mg/dL — ABNORMAL LOW (ref 2.5–4.6)
Phosphorus: 3.2 mg/dL (ref 2.5–4.6)
Potassium: 3.7 mmol/L (ref 3.5–5.1)
Potassium: 3.9 mmol/L (ref 3.5–5.1)
Sodium: 137 mmol/L (ref 135–145)
Sodium: 138 mmol/L (ref 135–145)

## 2018-05-18 LAB — CBC
HCT: 28.4 % — ABNORMAL LOW (ref 36.0–46.0)
Hemoglobin: 9.8 g/dL — ABNORMAL LOW (ref 12.0–15.0)
MCH: 31.6 pg (ref 26.0–34.0)
MCHC: 34.5 g/dL (ref 30.0–36.0)
MCV: 91.6 fL (ref 80.0–100.0)
NRBC: 0 % (ref 0.0–0.2)
Platelets: 82 10*3/uL — ABNORMAL LOW (ref 150–400)
RBC: 3.1 MIL/uL — AB (ref 3.87–5.11)
RDW: 15.7 % — ABNORMAL HIGH (ref 11.5–15.5)
WBC: 10.7 10*3/uL — ABNORMAL HIGH (ref 4.0–10.5)

## 2018-05-18 LAB — GLUCOSE, CAPILLARY
GLUCOSE-CAPILLARY: 118 mg/dL — AB (ref 70–99)
GLUCOSE-CAPILLARY: 117 mg/dL — AB (ref 70–99)
Glucose-Capillary: 116 mg/dL — ABNORMAL HIGH (ref 70–99)
Glucose-Capillary: 127 mg/dL — ABNORMAL HIGH (ref 70–99)
Glucose-Capillary: 125 mg/dL — ABNORMAL HIGH (ref 70–99)
Glucose-Capillary: 118 mg/dL — ABNORMAL HIGH (ref 70–99)

## 2018-05-18 LAB — BLOOD GAS, ARTERIAL
ACID-BASE EXCESS: 1.9 mmol/L (ref 0.0–2.0)
Bicarbonate: 25 mmol/L (ref 20.0–28.0)
FIO2: 40
O2 Saturation: 97.4 %
PEEP/CPAP: 5 cmH2O
Patient temperature: 98.6
Pressure control: 20 cmH2O
RATE: 20 resp/min
pCO2 arterial: 32.6 mmHg (ref 32.0–48.0)
pH, Arterial: 7.496 — ABNORMAL HIGH (ref 7.350–7.450)
pO2, Arterial: 98.3 mmHg (ref 83.0–108.0)

## 2018-05-18 LAB — MAGNESIUM: MAGNESIUM: 2.3 mg/dL (ref 1.7–2.4)

## 2018-05-18 MED ORDER — SODIUM CHLORIDE 0.9% FLUSH
10.0000 mL | INTRAVENOUS | Status: DC | PRN
  Administered 2018-06-01 (×2): 10 mL
  Filled 2018-05-18 (×2): qty 40

## 2018-05-18 MED ORDER — VITAL AF 1.2 CAL PO LIQD
1000.0000 mL | ORAL | Status: DC
  Administered 2018-05-18 – 2018-05-31 (×11): 1000 mL
  Filled 2018-05-18 (×9): qty 1000

## 2018-05-18 MED ORDER — SODIUM CHLORIDE 0.9% FLUSH
10.0000 mL | Freq: Two times a day (BID) | INTRAVENOUS | Status: DC
  Administered 2018-05-18 – 2018-05-24 (×11): 10 mL
  Administered 2018-05-24: 30 mL
  Administered 2018-05-25 – 2018-06-02 (×12): 10 mL

## 2018-05-18 MED ORDER — SODIUM CHLORIDE 0.9 % IV SOLN
500.0000 mg | Freq: Once | INTRAVENOUS | Status: AC
  Administered 2018-05-19: 500 mg via INTRAVENOUS
  Filled 2018-05-18: qty 0.5

## 2018-05-18 MED ORDER — FENTANYL CITRATE (PF) 100 MCG/2ML IJ SOLN
25.0000 ug | INTRAMUSCULAR | Status: DC | PRN
  Administered 2018-05-19 – 2018-05-25 (×21): 50 ug via INTRAVENOUS
  Administered 2018-05-25: 25 ug via INTRAVENOUS
  Administered 2018-05-26 – 2018-06-02 (×15): 50 ug via INTRAVENOUS
  Filled 2018-05-18 (×35): qty 2

## 2018-05-18 MED ORDER — ORAL CARE MOUTH RINSE
15.0000 mL | Freq: Two times a day (BID) | OROMUCOSAL | Status: DC
  Administered 2018-05-18 – 2018-05-29 (×23): 15 mL via OROMUCOSAL

## 2018-05-18 MED ORDER — POTASSIUM PHOSPHATES 15 MMOLE/5ML IV SOLN
15.0000 mmol | Freq: Once | INTRAVENOUS | Status: AC
  Administered 2018-05-18: 15 mmol via INTRAVENOUS
  Filled 2018-05-18: qty 5

## 2018-05-18 MED ORDER — PRO-STAT SUGAR FREE PO LIQD
30.0000 mL | Freq: Three times a day (TID) | ORAL | Status: DC
  Administered 2018-05-18 – 2018-06-02 (×44): 30 mL
  Filled 2018-05-18 (×42): qty 30

## 2018-05-18 MED ORDER — CHLORHEXIDINE GLUCONATE CLOTH 2 % EX PADS
6.0000 | MEDICATED_PAD | Freq: Every day | CUTANEOUS | Status: DC
  Administered 2018-05-18 – 2018-06-03 (×14): 6 via TOPICAL

## 2018-05-18 NOTE — Progress Notes (Signed)
Nutrition Consult/Follow Up  DOCUMENTATION CODES:   Not applicable  INTERVENTION:    Vital AF 1.2 at goal rate of 50 ml/hr with Prostat 30 ml TID  Provides 1740 kcals, 135 gm protein, 973 ml free water daily  NUTRITION DIAGNOSIS:   Inadequate oral intake related to inability to eat as evidenced by NPO status, ongoing  GOAL:   Patient will meet greater than or equal to 90% of their needs, met   MONITOR:   TF tolerance, Diet advancement, Labs, Skin, Weight trends, I & O's  ASSESSMENT:   Pt with PMH T2DM, COPD, HTN, HLD and depression. Presented to [facility] for aortobifemoral bypass procedure on 2/26. Found to have extensive necrotic bowel which was all resected 2/27.    2/26 s/p aortobifemoral bypass for aortoiliac occlusive disease w/aortic occlusion 2/27 intubated for respiratory insufficiency 2/27 s/p exp lap; found ischemic bowel/colon 2/29 s/p ileocolonic anastomosis, abd closure & colostomy  3/02 started trickle feeds via OGT 3/04 Cortrak placed; tip in stomach  Pt extubated this AM.  Vital AF 1.2 infusing at goal rate of 40 ml/hr via Cortrak tube. Spoke with Suzy Bouchard, RD who attended rounds this AM; pt tolerating TF well.  Anuric AKI; off CVVHD; lactic & metabolic acidosis improved. Labs & medications reviewed. Phos 1.5 (L). CBG's [id number].  Speech Path consulted for swallow evaluation. Pt's weight appears to be back to baseline.  Diet Order:   Diet Order            Diet NPO time specified  Diet effective now             EDUCATION NEEDS:   No education needs have been identified at this time  Skin:  Skin Assessment: Skin Integrity Issues: Skin Integrity Issues:: Other (Comment) Incisions: Abdomen, groin Other: MASD to coccyx & sacrum  Last BM:  3/6   Intake/Output Summary (Last 24 hours) at 05/18/2018 1444 Last data filed at 05/18/2018 1100 Gross per 24 hour  Intake 2522.23 ml  Output 5232 ml  Net -2709.77 ml   Height:   Ht Readings from Last 1  Encounters:  05/03/2018 _0  (1.626 m)   Weight:   Wt Readings from Last 1 Encounters:  05/18/18 61.3 kg  04/15/2018          62 kg  Ideal Body Weight:  56.8 kg  BMI:  Body mass index is 23.2 kg/m.  Estimated Nutritional Needs:   Kcal:  1700-1900  Protein:  125-140 gm  Fluid:  per MD  Arthur Holms, RD, LDN Pager #: [phone] After-Hours Pager #: [phone]

## 2018-05-18 NOTE — Progress Notes (Signed)
100cc of Fent wasted in the sink by Devota Pace RN and Ellwood Handler RN.

## 2018-05-18 NOTE — Progress Notes (Signed)
[facility]/Trauma Progress Note  5 Days Post-Op   Assessment/Plan AIOD with aortic occlusion - S/PAortobifemoral bypass wth 14 x 8 Hemashield graft, 05/09/2018, Dr. Arbie Cookey Ischemic distal small bowel, right and left colon ischemia: - S/Pex lap with right and left colon resection, small bowel resection 2/27 Magnus Ivan)- S/Pileo-transverse colon anastomosis and left end colostomy 2/29 Gwendolyn Pham) - midline with wound vac - good stool output, 1175cc in last 24hrs, monitor   FEN:TF's VTE: SCD's,heparin for CRRT OE:HOZY stopped 03/04, Merrem, WBC down to 10.7, monitor, afebrile Foley:yes, CRRT Follow up:TBD  DISPO:okay for TF to goal, wound vac. weaning   LOS: 8 days    Subjective: YQ:MGNOIBBC bowel  Awake on vent, following commands. She denies abdominal pain. Attempting to wean off vent again this am.   Objective: Vital signs in last 24 hours: Temp:  [95.9 F (35.5 C)-98.4 F (36.9 C)] 98.1 F (36.7 C) (03/05 0333) Pulse Rate:  [30-104] 89 (03/05 0737) Resp:  [16-30] 19 (03/05 0737) BP: (89-156)/(53-103) 128/58 (03/05 0737) SpO2:  [61 %-100 %] 100 % (03/05 0737) Arterial Line BP: (103-177)/(46-85) 111/52 (03/05 0700) FiO2 (%):  [40 %] 40 % (03/05 0737) Weight:  [61.3 kg] 61.3 kg (03/05 0500) Last BM Date: 05/18/18  Intake/Output from previous day: 03/04 0701 - 03/05 0700 In: 3050.7 [I.V.:1385.6; NG/GT:1060; IV Piggyback:605.2] Out: 6632 [Stool:1175] Intake/Output this shift: No intake/output data recorded.  PE: Gen:On vent, following commands, ill appearing Card: RRR Pulm:On vent Abd: Soft, ND,+BS,stoma appears viable with gas and brown liquid stool in bag. Midlinewith wound vac in place, mild TTP without guarding Skin: warm and dry  Anti-infectives: Anti-infectives (From admission, onward)   Start     Dose/Rate Route Frequency Ordered Stop   05/17/18 1130  vancomycin (VANCOCIN) IVPB 750 mg/150 ml premix  Status:   Discontinued     750 mg 150 mL/hr over 60 Minutes Intravenous Every 24 hours 05/17/18 0733 05/17/18 0919   05/16/18 0900  vancomycin (VANCOCIN) IVPB 750 mg/150 ml premix  Status:  Discontinued     750 mg 150 mL/hr over 60 Minutes Intravenous Every 24 hours 05/16/18 0745 05/17/18 0733   05/15/18 0800  vancomycin (VANCOCIN) IVPB 750 mg/150 ml premix  Status:  Discontinued     750 mg 150 mL/hr over 60 Minutes Intravenous Every 24 hours 05/14/18 0916 05/16/18 0745   04/23/2018 2200  meropenem (MERREM) 1 g in sodium chloride 0.9 % 100 mL IVPB     1 g 200 mL/hr over 30 Minutes Intravenous Every 12 hours 04/30/2018 1714     05/12/18 1115  vancomycin (VANCOCIN) 1,250 mg in sodium chloride 0.9 % 250 mL IVPB     1,250 mg 166.7 mL/hr over 90 Minutes Intravenous  Once 05/12/18 1109 05/12/18 1348   05/12/18 1115  meropenem (MERREM) 500 mg in sodium chloride 0.9 % 100 mL IVPB  Status:  Discontinued     500 mg 200 mL/hr over 30 Minutes Intravenous Every 12 hours 05/12/18 1109 05/09/2018 1714   05/12/18 1109  vancomycin variable dose per unstable renal function (pharmacist dosing)  Status:  Discontinued      Does not apply See admin instructions 05/12/18 1109 05/14/18 0900   05/02/2018 1800  vancomycin (VANCOCIN) IVPB 1000 mg/200 mL premix     1,000 mg 200 mL/hr over 60 Minutes Intravenous Every 12 hours 05/02/2018 1357 04/27/2018 1759   05/12/2018 0540  vancomycin (VANCOCIN) IVPB 1000 mg/200 mL premix     1,000 mg 200 mL/hr over 60 Minutes Intravenous 60  min pre-op 05/08/2018 0540 04/22/2018 1504      Lab Results:  Recent Labs    05/17/18 0754 05/18/18 0356  WBC 12.4* 10.7*  HGB 9.6* 9.8*  HCT 28.5* 28.4*  PLT 85* 82*   BMET Recent Labs    05/17/18 1521 05/18/18 0356  NA 137 137  K 4.0 3.7  CL 106 101  CO2 24 23  GLUCOSE 143* 139*  BUN 14 15  CREATININE 1.40* 1.22*  CALCIUM 7.3* 8.0*   PT/INR No results for input(s): LABPROT, INR in the last 72 hours. CMP     Component Value Date/Time   NA  137 05/18/2018 0356   NA 137 01/23/2018 1218   K 3.7 05/18/2018 0356   CL 101 05/18/2018 0356   CO2 23 05/18/2018 0356   GLUCOSE 139 (H) 05/18/2018 0356   BUN 15 05/18/2018 0356   BUN 11 01/23/2018 1218   CREATININE 1.22 (H) 05/18/2018 0356   CREATININE 0.66 06/18/2015 1855   CALCIUM 8.0 (L) 05/18/2018 0356   PROT 5.5 (L) 05/18/2018 0356   PROT 7.2 01/23/2018 1218   ALBUMIN 1.6 (L) 05/18/2018 0356   ALBUMIN 1.6 (L) 05/18/2018 0356   ALBUMIN 4.3 01/23/2018 1218   AST 222 (H) 05/18/2018 0356   ALT 172 (H) 05/18/2018 0356   ALKPHOS 154 (H) 05/18/2018 0356   BILITOT 2.0 (H) 05/18/2018 0356   BILITOT 0.2 01/23/2018 1218   GFRNONAA 47 (L) 05/18/2018 0356   GFRNONAA >89 01/31/2014 1532   GFRAA 55 (L) 05/18/2018 0356   GFRAA >89 01/31/2014 1532   Lipase  No results found for: LIPASE  Studies/Results: Dg Chest Port 1 View  Result Date: 05/17/2018 CLINICAL DATA:  Check endotracheal tube placement EXAM: PORTABLE CHEST 1 VIEW COMPARISON:  05/16/2018 FINDINGS: Cardiac shadow is within normal limits. Endotracheal tube and gastric catheter are noted in satisfactory position. Temporary dialysis catheter is noted in the left jugular vein with a sheath in the right jugular vein. These are stable from the prior exam. Bibasilar atelectasis is noted but slightly improved when compared with the prior exam. No pneumothorax or sizable effusion is seen. IMPRESSION: 1. Slight improvement in bibasilar atelectasis. 2. Tubes and lines as described. 3. Lungs otherwise clear. Electronically Signed   By: Alcide Clever M.D.   On: 05/17/2018 08:21      Jerre Simon , [facility] Surgery 05/18/2018, 7:48 AM  Pager: [phone] Mon-Wed, Friday 7:00am-4:30pm Thurs 7am-11:30am  Consults: [phone]

## 2018-05-18 NOTE — Progress Notes (Signed)
Patient ID: Gwendolyn Pham, female   DOB: 1955/10/03, 63 y.o.   MRN: [id number]  Progress Note    05/18/2018 8:02 AM 5 Days Post-Op  Subjective: More alert on vent.  Answering questions and following commands   Vitals:   05/18/18 0700 05/18/18 0737  BP: (!) 111/57 (!) 128/58  Pulse: (!) 44 89  Resp: 20 19  Temp:    SpO2: 100% 100%   Physical Exam: Abdomen soft.  Some stool in her stoma.  2+ femoral and 2+ dorsalis pedis pulses  CBC    Component Value Date/Time   WBC 10.7 (H) 05/18/2018 0356   RBC 3.10 (L) 05/18/2018 0356   HGB 9.8 (L) 05/18/2018 0356   HGB 15.8 01/23/2018 1218   HCT 28.4 (L) 05/18/2018 0356   HCT 45.9 01/23/2018 1218   PLT 82 (L) 05/18/2018 0356   PLT 253 01/23/2018 1218   MCV 91.6 05/18/2018 0356   MCV 93 01/23/2018 1218   MCH 31.6 05/18/2018 0356   MCHC 34.5 05/18/2018 0356   RDW 15.7 (H) 05/18/2018 0356   RDW 13.7 01/23/2018 1218   LYMPHSABS 1.1 04/28/2018 1802   LYMPHSABS 2.8 01/23/2018 1218   MONOABS 0.3 04/19/2018 1802   EOSABS 0.0 05/01/2018 1802   EOSABS 0.1 01/23/2018 1218   BASOSABS 0.0 04/30/2018 1802   BASOSABS 0.1 01/23/2018 1218    BMET    Component Value Date/Time   NA 137 05/18/2018 0356   NA 137 01/23/2018 1218   K 3.7 05/18/2018 0356   CL 101 05/18/2018 0356   CO2 23 05/18/2018 0356   GLUCOSE 139 (H) 05/18/2018 0356   BUN 15 05/18/2018 0356   BUN 11 01/23/2018 1218   CREATININE 1.22 (H) 05/18/2018 0356   CREATININE 0.66 06/18/2015 1855   CALCIUM 8.0 (L) 05/18/2018 0356   GFRNONAA 47 (L) 05/18/2018 0356   GFRNONAA >89 01/31/2014 1532   GFRAA 55 (L) 05/18/2018 0356   GFRAA >89 01/31/2014 1532    INR    Component Value Date/Time   INR 1.4 (H) 05/12/2018 0415     Intake/Output Summary (Last 24 hours) at 05/18/2018 0802 Last data filed at 05/18/2018 0729 Gross per 24 hour  Intake 3013.21 ml  Output 6272 ml  Net -3258.79 ml     Assessment/Plan:  63 y.o. female hemodynamically stable.  Wean today per critical  care     Larina Earthly, MD FACS [facility] [phone] 05/18/2018 8:02 AM

## 2018-05-18 NOTE — Procedures (Signed)
Extubation Procedure Note  Patient Details:   Name: Gwendolyn Pham DOB: 10-30-1955 MRN: [id number]   Airway Documentation:   Patient extubated per MD orders. Patient had positive cuff leak prior to extubation. Patient now has strong cough and able to voice her name. Placed on 4lpm humidified O@ via nasal cannula.  Vent end date: 05/18/18 Vent end time: 0952   Evaluation  O2 sats: stable throughout Complications: No apparent complications Patient did tolerate procedure well. Bilateral Breath Sounds: Clear   Yes  LEITH MAXIE 05/18/2018, 9:53 AM

## 2018-05-18 NOTE — Progress Notes (Signed)
NAME:  Gwendolyn Pham, MRN:  [id number], DOB:  12-07-1955, LOS: 8 ADMISSION DATE:  05-30-18, CONSULTATION DATE:  05/08/2018 REFERRING MD:  Chestine Spore  CHIEF COMPLAINT:  Hypotension   Brief History   Gwendolyn Pham is a 63 y.o. female who was admitted 2/26 for aorto femoral bypass.  2/27 had hypotension and respiratory distress.  Intubated that night and getting transfused 2u PRBC.  Vascular surgery following and planning to take back to OR if hypotension does not resolve with blood products.  History of present illness   Pt is encephelopathic; therefore, this HPI is obtained from chart review. Gwendolyn Pham is a 63 y.o. female who has a PMH as outlined below (see "past medical history").  She presented to [facility] 2/26 for aortofemoral bypass due to occlusive disease.  Procedure was completed without complications.  During afternoon hours of 2/27, she sat up in bed to try and use the commode and shortly thereafter, she became hypotensive.  She was given 3L IVF bolus but BP remained soft.  Hgb had dropped from 11.1 to 8.6.  Vascular surgery was called who felt that pt needed to be intubated while further workup continued.  PCCM was called in consultation.  At the time of our evaluation, pt is awake but is in distress.  She is pale appearing.  SBP in the 80s.  She has had very low UOP, essentially anuric.  She is currently receiving 1u PRBC.  Past Medical History  PVD, HTN, HLD, DM, depression, COPD, asthma, anxiety.  Significant Hospital Events   2/26 > admit, taken to OR for aortofemoral bypass. 2/27 > intubated.  Septic shock 2/27 > emergent exploratory laparotomy, small bowel resection, bilateral colectomy  Consults:  PCCM. General surgery  Procedures:  ETT 2/27 > 3/5 Art line 2/27 > 3/5  Significant Diagnostic Tests:  CXR 2/27 > mild atelectasis. Renal US 2/28 > Head CT 2/28 >> chronically advanced cerebral white matter disease, no acute abnormality Renal ultrasound 2/28 >> no acute findings,  no postobstructive process CXR 3/3> bilateral lower lobe opacities R>L  Micro Data:  Blood 2/28 >> NGTD   Antimicrobials:  Meropenem 2/28 >>  Vancomycin 2/26 >>3/4  Interim history/subjective:  No events overnight, tolerating wean well this AM  Objective:  Blood pressure 128/64, pulse (!) 45, temperature 98.1 F (36.7 C), temperature source Axillary, resp. rate 20, height 5\' 4"  (1.626 m), weight 61.3 kg, SpO2 100 %.    Vent Mode: PSV;CPAP FiO2 (%):  [40 %] 40 % Set Rate:  [20 bmp] 20 bmp PEEP:  [5 cmH20] 5 cmH20 Pressure Support:  [5 cmH20] 5 cmH20 Plateau Pressure:  [15 cmH20-18 cmH20] 15 cmH20   Intake/Output Summary (Last 24 hours) at 05/18/2018 1287 Last data filed at 05/18/2018 0900 Gross per 24 hour  Intake 2937.31 ml  Output 6430 ml  Net -3492.69 ml   Filed Weights   05/16/18 0317 05/17/18 0500 05/18/18 0500  Weight: 67.4 kg 69 kg 61.3 kg    Examination: General: Critically ill appearing female that is improving Neuro: Alert and interactive, moving all ext to command HEENT: [place]/AT, PERRL, EOM-I and MMM Cardiovascular: RRR, Nl S1/S2 and -M/R/G Lungs: Coarse BS diffusely Abdomen: round, non-distended abdomen, hypoactive bowel sounds. Midline wound packing. Ostomy beefy red.  Musculoskeletal: symmetrical bulk and tone.  Skin: Ecchymosis scattered on abdomen, pubis. Clean, dry, warm.   Resolved Problems:    Assessment & Plan:   Acute respiratory failure -septic shock, ischemic bowel  P Extubate OOB  PT SLP Titrate O2 for sat of 88-92%  Bowel ischemia, status post bowel resection 2/27, closure 2/29 Wound care and follow-up care as per CCS recommendations TF at 40 ml/hr, will call nutrition Continue TF after extubation via cortrak  Peripheral vascular disease, aortobifemoral bypass 2/26 As per vascular surgery management No anticoagulation at this time, defer to VVS with regard to timing SCDs only for VTE ppx   Septic shock, source consistent with  ischemic bowel, possible peritonitis, improved Blood cultures negative so far D/Ced vanc Continue merrem for now, d/c in AM after a total of 1 week D/Ced pressors  Acute renal failure, presumed ATN oliguric Lactic acidosis, anion gap metabolic acidosis, improved Hyperkalemia D/C CRRT Check BMET in AM to decide on HD Trend BMP, replace lytes PRN  Transaminitis - presumed due to hypotension / low flow.  Improving from 3/1 Trend LFT  Anemia - as above, concern for hemorrhage but reassuring eval on exploratory laparotomy Thrombocytopenia - unclear etiology, suspect due to sepsis plus possible component of consumption.  DIC panel with adequate fibrinogen, no schistocytes Received 2 units PRBC,  Plan: Goal hgb>7 in absence of active bleed Trend H/H  Hx HTN, HLD. Lopressor and lipitor PRN labetalol but off for now due to hypotension, may need to restart  Hx DM. Holding metformin Sliding-scale insulin per protocol  Hx depression, anxiety. Bupropion, venlafaxine being held at this time  Sister updated bedside, full code  Labs   CBC: Recent Labs  Lab 05/05/2018 1802  04/22/2018 0507  05/14/18 0504 05/16/18 0344 05/16/18 0500 05/17/18 0754 05/18/18 0356  WBC 10.1   < > 9.5  --  6.8  --  12.4* 12.4* 10.7*  NEUTROABS 8.8*  --   --   --   --   --   --   --   --   HGB 8.6*   < > 10.6*   < > 9.7* 9.9* 10.2* 9.6* 9.8*  HCT 27.3*   < > 31.1*   < > 28.7* 29.0* 30.6* 28.5* 28.4*  MCV 98.2   < > 88.9  --  90.8  --  90.5 90.8 91.6  PLT 71*   < > 76*  --  64*  --  76* 85* 82*   < > = values in this interval not displayed.   Basic Metabolic Panel: Recent Labs  Lab 05/15/18 0503 05/15/18 1622  05/16/18 0500 05/16/18 1559 05/16/18 2030 05/17/18 0325 05/17/18 1521 05/18/18 0356  NA 138 138   < > 136 137  --  138 137 137  K 4.2 3.8   < > 3.9 3.7  --  4.0 4.0 3.7  CL 105 107  --  104 106  --  105 106 101  CO2 24 25  --  24 24  --  GLUCOSE 77 101*  --  108* 112*  --   135* 143* 139*  BUN 10 7*  --  9 11  --  CREATININE 1.65* 1.43*  --  1.32* 1.30*  --  1.23* 1.40* 1.22*  CALCIUM 7.3* 7.1*  --  7.9* 7.3*  --  7.3* 7.3* 8.0*  MG 2.4 2.3  --  2.5*  --  2.5*  --   --  2.3  PHOS 2.6 2.1*  --  1.8* 1.4*  --  2.2* 2.5 1.5*   < > = values in this interval not displayed.   GFR: Estimated Creatinine Clearance: 40.8 mL/min (A) (  by C-G formula based on SCr of 1.22 mg/dL (H)). Recent Labs  Lab 05/12/18 0415  05/12/18 0957  2018-05-14 0507 05/14/18 0504 05/16/18 0500 05/17/18 0754 05/18/18 0356  WBC 4.5   < >  --    < > 9.5 6.8 12.4* 12.4* 10.7*  LATICACIDVEN 3.5*  --  3.8*  --  2.3* 1.7  --   --   --    < > = values in this interval not displayed.   Liver Function Tests: Recent Labs  Lab 05/12/18 0415 05/12/18 1014  05/14/18 0504  05/16/18 0500 05/16/18 1559 05/17/18 0325 05/17/18 1521 05/18/18 0356  AST 804* 621*  --  667*  --  409*  --   --   --  222*  ALT 565* 520*  --  575*  --  327*  --   --   --  172*  ALKPHOS 38 45  --  135*  --  202*  --   --   --  154*  BILITOT 1.9* 2.0*  --  4.0*  --  2.9*  --   --   --  2.0*  PROT 4.4* 4.3*  --  4.6*  --  5.4*  --   --   --  5.5*  ALBUMIN 2.7* 2.4*   < > 1.7*   < > 1.6*  1.6* 1.5* 1.5* 1.5* 1.6*  1.6*   < > = values in this interval not displayed.   No results for input(s): LIPASE, AMYLASE in the last 168 hours. Recent Labs  Lab 05/12/18 0957  AMMONIA 20   ABG    Component Value Date/Time   PHART 7.496 (H) 05/18/2018 0405   PCO2ART 32.6 05/18/2018 0405   PO2ART 98.3 05/18/2018 0405   HCO3 25.0 05/18/2018 0405   TCO2 26 05/16/2018 0344   ACIDBASEDEF 5.0 (H) 2018-05-14 0927   O2SAT 97.4 05/18/2018 0405    Coagulation Profile: Recent Labs  Lab 04/23/2018 1959 05/12/18 0415  INR 1.5* 1.4*   Cardiac Enzymes: Recent Labs  Lab 05/05/2018 1810 04/19/2018 2000  TROPONINI <0.03 <0.03   HbA1C: Hgb A1c MFr Bld  Date/Time Value Ref Range Status  05/02/2018 10:28 AM 6.4 (H) 4.8 - 5.6  % Final    Comment:    (NOTE)         Prediabetes: 5.7 - 6.4         Diabetes: >6.4         Glycemic control for adults with diabetes: <7.0   01/23/2018 12:18 PM 6.4 (H) 4.8 - 5.6 % Final    Comment:             Prediabetes: 5.7 - 6.4          Diabetes: >6.4          Glycemic control for adults with diabetes: <7.0    CBG: Recent Labs  Lab 05/17/18 1548 05/17/18 1945 05/17/18 2321 05/18/18 0332 05/18/18 0732  GLUCAP 125* 120* 112* 118* 116*   The patient is critically ill with multiple organ systems failure and requires high complexity decision making for assessment and support, frequent evaluation and titration of therapies, application of advanced monitoring technologies and extensive interpretation of multiple databases.   Critical Care Time devoted to patient care services described in this note is  32  Minutes. This time reflects time of care of this signee Dr Koren Bound. This critical care time does not reflect procedure time, or teaching time or supervisory time of PA/NP/Med  student/Med Resident etc but could involve care discussion time.  Wesam G. Yacoub, M.D. [facility]. Pager: [phone]. After hours pager: [phone]. 

## 2018-05-18 NOTE — Progress Notes (Signed)
  Speech Language Pathology   Patient Details Name: Gwendolyn Pham MRN: [id number] DOB: Oct 20, 1955 Today's Date: 05/18/2018 Time:  -      Pt extubated 9:52. Although 4 hours have passed, given 8 day intubation and history of COPD (which and slightly increase risk of aspiration at baseline), pt would benefit from deferring swallow assessment to am of 3/6 with better chances of po recommendation.               GO                Royce Macadamia 05/18/2018, 2:17 PM  Breck Coons Lonell Face.Ed Nurse, children's [phone] Office [phone]

## 2018-05-18 NOTE — Progress Notes (Signed)
[facility] NEPHROLOGY PROGRESS NOTE  Assessment/ Plan: Pt is a 63 y.o. yo female admitted for aortofemoral bypass, had hypotension and respiratory failure, necrotic bowel requiring ex lap, intubated anuric AKI requiring CRRT.  #Oliguric AKI presumably due to ischemic ATN due to sepsis, ABLA and necrotic bowel: No significant urine output.  She is on CRRT since 2/29 with temporary catheter.  Electrolytes and volume status acceptable.  Plan to discontinue CRRT today and watch for renal recovery.  She may need intermittent hemodialysis.  Avoid nephrotoxins, IV contrast.  #Hypophosphatemia: Continue to replete phosphorus, due to CRRT.  #Acute respiratory failure on mechanical ventilation, per PCCM  #Septic shock due to ischemic and necrotic bowel: Off pressors.  Pressure improving.  #PAD status post aortobifem bypass on 05/06/2018.  #Ischemic bowel status post small bowel resection and right and left colectomy with colostomy: Per surgery team.  #Thrombocytopenia: no systemic heparin with CVVH.  Discussed with the patient's nurse and patient's sister.  Subjective: Seen and examined in iCU.  Remains oliguric.  Vent setting is improving.  Patient sister at bedside.   Objective Vital signs in last 24 hours: Vitals:   05/18/18 0600 05/18/18 0700 05/18/18 0737 05/18/18 0800  BP: (!) 151/80 (!) 111/57 (!) 128/58 110/60  Pulse: 94 (!) 44 89 (!) 31  Resp: 19 20 19  (!) 22  Temp:      TempSrc:      SpO2: 100% 100% 100% 100%  Weight:      Height:       Weight change: -7.7 kg  Intake/Output Summary (Last 24 hours) at 05/18/2018 0850 Last data filed at 05/18/2018 0800 Gross per 24 hour  Intake 3040.18 ml  Output 6413 ml  Net -3372.82 ml       Labs: Basic Metabolic Panel: Recent Labs  Lab 05/17/18 0325 05/17/18 1521 05/18/18 0356  NA 138 137 137  K 4.0 4.0 3.7  CL 105 106 101  CO2 25 24 23   GLUCOSE 135* 143* 139*  BUN 11 14 15   CREATININE 1.23* 1.40* 1.22*   CALCIUM 7.3* 7.3* 8.0*  PHOS 2.2* 2.5 1.5*   Liver Function Tests: Recent Labs  Lab 05/14/18 0504  05/16/18 0500  05/17/18 0325 05/17/18 1521 05/18/18 0356  AST 667*  --  409*  --   --   --  222*  ALT 575*  --  327*  --   --   --  172*  ALKPHOS 135*  --  202*  --   --   --  154*  BILITOT 4.0*  --  2.9*  --   --   --  2.0*  PROT 4.6*  --  5.4*  --   --   --  5.5*  ALBUMIN 1.7*   < > 1.6*  1.6*   < > 1.5* 1.5* 1.6*  1.6*   < > = values in this interval not displayed.   No results for input(s): LIPASE, AMYLASE in the last 168 hours. Recent Labs  Lab 05/12/18 0957  AMMONIA 20   CBC: Recent Labs  Lab 05/07/2018 1802  05/07/2018 0507  05/14/18 0504  05/16/18 0500 05/17/18 0754 05/18/18 0356  WBC 10.1   < > 9.5  --  6.8  --  12.4* 12.4* 10.7*  NEUTROABS 8.8*  --   --   --   --   --   --   --   --   HGB 8.6*   < > 10.6*   < >  9.7*   < > 10.2* 9.6* 9.8*  HCT 27.3*   < > 31.1*   < > 28.7*   < > 30.6* 28.5* 28.4*  MCV 98.2   < > 88.9  --  90.8  --  90.5 90.8 91.6  PLT 71*   < > 76*  --  64*  --  76* 85* 82*   < > = values in this interval not displayed.   Cardiac Enzymes: Recent Labs  Lab 04/21/2018 1810 05/07/2018 2000  TROPONINI <0.03 <0.03   CBG: Recent Labs  Lab 05/17/18 1548 05/17/18 1945 05/17/18 2321 05/18/18 0332 05/18/18 0732  GLUCAP 125* 120* 112* 118* 116*    Iron Studies: No results for input(s): IRON, TIBC, TRANSFERRIN, FERRITIN in the last 72 hours. Studies/Results: Dg Chest Port 1 View  Result Date: 05/17/2018 CLINICAL DATA:  Check endotracheal tube placement EXAM: PORTABLE CHEST 1 VIEW COMPARISON:  05/16/2018 FINDINGS: Cardiac shadow is within normal limits. Endotracheal tube and gastric catheter are noted in satisfactory position. Temporary dialysis catheter is noted in the left jugular vein with a sheath in the right jugular vein. These are stable from the prior exam. Bibasilar atelectasis is noted but slightly improved when compared with the prior  exam. No pneumothorax or sizable effusion is seen. IMPRESSION: 1. Slight improvement in bibasilar atelectasis. 2. Tubes and lines as described. 3. Lungs otherwise clear. Electronically Signed   By: Inez Catalina M.D.   On: 05/17/2018 08:21    Medications: Infusions: .  prismasol BGK 4/2.5 500 mL/hr at 05/18/18 0522  .  prismasol BGK 4/2.5 300 mL/hr at 05/18/18 0752  . sodium chloride 10 mL/hr at 05/14/18 0700  . dextrose 5 % and 0.9% NaCl 50 mL/hr at 05/18/18 0804  . fentaNYL infusion INTRAVENOUS 25 mcg/hr (05/18/18 0800)  . meropenem (MERREM) IV Stopped (05/17/18 2230)  . prismasol BGK 4/2.5 1,500 mL/hr at 05/18/18 5859    Scheduled Medications: . atorvastatin  80 mg Per Tube Daily  . chlorhexidine gluconate (MEDLINE KIT)  15 mL Mouth Rinse BID  . Chlorhexidine Gluconate Cloth  6 each Topical Daily  . feeding supplement (PRO-STAT SUGAR FREE 64)  60 mL Per Tube BID  . feeding supplement (VITAL AF 1.2 CAL)  1,000 mL Per Tube Q24H  . Gerhardt's butt cream   Topical BID  . insulin aspart  0-9 Units Subcutaneous Q4H  . mouth rinse  15 mL Mouth Rinse 10 times per day  . metoprolol tartrate  5 mg Intravenous Q8H  . pantoprazole (PROTONIX) IV  40 mg Intravenous Daily  . sodium chloride flush  10-40 mL Intracatheter Q12H    have reviewed scheduled and prn medications.  Physical Exam: General: Intubated, opening eyes and moving extremities today. Heart: Regular rate rhythm S1-S2 normal, no rubs Lungs: Worse breath sound bilateral, no wheezing Abdomen: Ex lap incision site with dressing applied, no bleeding Extremities:No edema Dialysis Access: Temporary catheter.  No bleeding  Nyaire Denbleyker Prasad Jaicion Laurie 05/18/2018,8:50 AM  LOS: 8 days

## 2018-05-18 NOTE — Progress Notes (Signed)
Pharmacy Antibiotic Note  Gwendolyn Pham is a 63 y.o. female admitted on 2018/05/27 with r/o peritonitis.  Pharmacy has been consulted for meropenem and vancomycin dosing. CRRT initiated 2/29, now with plans to stop today and assess renal recovery in the morning. Vancomycin has been stopped, per CCM ok to stop meropenem after tomorrow mornings dose. As CRRT is to be discontinued, will adjust final dosing of meropenem.  Plan: -Stop meropenem 1g IV q12h and adjust to 500mg  IV x1 tomorrow morning -Pharmacy will sign off, reconsult if needed   Height: 5\' 4"  (162.6 cm) Weight: 135 lb 2.3 oz (61.3 kg) IBW/kg (Calculated) : 54.7  Temp (24hrs), Avg:97.8 F (36.6 C), Min:95.9 F (35.5 C), Max:98.4 F (36.9 C)  Recent Labs  Lab 05/12/18 0006 05/12/18 0415  05/12/18 0957  05/02/2018 0507  05/14/18 0504 05/14/18 0549  05/16/18 0500 05/16/18 1559 05/17/18 0325 05/17/18 0754 05/17/18 1521 05/18/18 0356  WBC 3.4* 4.5   < >  --    < > 9.5  --  6.8  --   --  12.4*  --   --  12.4*  --  10.7*  CREATININE 2.69* 3.14*  --   --    < > 4.29*   < > 2.70*  --    < > 1.32* 1.30* 1.23*  --  1.40* 1.22*  LATICACIDVEN 3.4* 3.5*  --  3.8*  --  2.3*  --  1.7  --   --   --   --   --   --   --   --   VANCORANDOM  --   --   --   --   --   --   --   --  36  --   --   --   --   --   --   --    < > = values in this interval not displayed.    Estimated Creatinine Clearance: 40.8 mL/min (A) (by C-G formula based on SCr of 1.22 mg/dL (H)).    Allergies  Allergen Reactions  . Lisinopril Cough    Muscle pain   . Penicillins Rash    Did it involve swelling of the face/tongue/throat, SOB, or low BP? No Did it involve sudden or severe rash/hives, skin peeling, or any reaction on the inside of your mouth or nose? No Did you need to seek medical attention at a hospital or doctor's office? No When did it last happen?Longer than 10 years ago If all above answers are "NO", may proceed with cephalosporin use.      Antimicrobials this admission:  Vancomycin 2/28 >> 3/4 Merrem 2/28 >> (3/6)  Dose adjustments this admission:  See above  Cultures: 2/28 BCx x 2>> ngtd   Thank you for allowing pharmacy to be a part of this patient's care.  Fredonia Highland, PharmD, BCPS Clinical Pharmacist [phone] Please check AMION for all [facility] numbers 05/18/2018

## 2018-05-19 DIAGNOSIS — J810 Acute pulmonary edema: Secondary | ICD-10-CM

## 2018-05-19 LAB — RENAL FUNCTION PANEL
Albumin: 1.7 g/dL — ABNORMAL LOW (ref 3.5–5.0)
Albumin: 1.7 g/dL — ABNORMAL LOW (ref 3.5–5.0)
Albumin: 1.7 g/dL — ABNORMAL LOW (ref 3.5–5.0)
Anion gap: 11 (ref 5–15)
Anion gap: 11 (ref 5–15)
Anion gap: 8 (ref 5–15)
BUN: 43 mg/dL — ABNORMAL HIGH (ref 8–23)
BUN: 45 mg/dL — ABNORMAL HIGH (ref 8–23)
BUN: 49 mg/dL — AB (ref 8–23)
CHLORIDE: 110 mmol/L (ref 98–111)
CO2: 19 mmol/L — ABNORMAL LOW (ref 22–32)
CO2: 21 mmol/L — ABNORMAL LOW (ref 22–32)
CO2: 21 mmol/L — ABNORMAL LOW (ref 22–32)
CREATININE: 2.5 mg/dL — AB (ref 0.44–1.00)
CREATININE: 2.72 mg/dL — AB (ref 0.44–1.00)
Calcium: 7.6 mg/dL — ABNORMAL LOW (ref 8.9–10.3)
Calcium: 7.9 mg/dL — ABNORMAL LOW (ref 8.9–10.3)
Calcium: 8 mg/dL — ABNORMAL LOW (ref 8.9–10.3)
Chloride: 105 mmol/L (ref 98–111)
Chloride: 111 mmol/L (ref 98–111)
Creatinine, Ser: 2.52 mg/dL — ABNORMAL HIGH (ref 0.44–1.00)
GFR calc Af Amer: 23 mL/min — ABNORMAL LOW (ref >60)
GFR calc Af Amer: 21 mL/min — ABNORMAL LOW (ref >60)
GFR calc non Af Amer: 20 mL/min — ABNORMAL LOW (ref >60)
GFR calc non Af Amer: 20 mL/min — ABNORMAL LOW (ref >60)
GFR calc non Af Amer: 18 mL/min — ABNORMAL LOW (ref >60)
GFR, EST AFRICAN AMERICAN: 23 mL/min — AB (ref >60)
Glucose, Bld: 226 mg/dL — ABNORMAL HIGH (ref 70–99)
Glucose, Bld: 124 mg/dL — ABNORMAL HIGH (ref 70–99)
Glucose, Bld: 149 mg/dL — ABNORMAL HIGH (ref 70–99)
POTASSIUM: 3.9 mmol/L (ref 3.5–5.1)
Phosphorus: 8.1 mg/dL — ABNORMAL HIGH (ref 2.5–4.6)
Phosphorus: 3.1 mg/dL (ref 2.5–4.6)
Phosphorus: 3 mg/dL (ref 2.5–4.6)
Potassium: 6.1 mmol/L — ABNORMAL HIGH (ref 3.5–5.1)
Potassium: 4 mmol/L (ref 3.5–5.1)
Sodium: 135 mmol/L (ref 135–145)
Sodium: 142 mmol/L (ref 135–145)
Sodium: 140 mmol/L (ref 135–145)

## 2018-05-19 LAB — GLUCOSE, CAPILLARY
Glucose-Capillary: 116 mg/dL — ABNORMAL HIGH (ref 70–99)
Glucose-Capillary: 119 mg/dL — ABNORMAL HIGH (ref 70–99)
Glucose-Capillary: 134 mg/dL — ABNORMAL HIGH (ref 70–99)
Glucose-Capillary: 141 mg/dL — ABNORMAL HIGH (ref 70–99)
Glucose-Capillary: 149 mg/dL — ABNORMAL HIGH (ref 70–99)

## 2018-05-19 LAB — CBC
HCT: 27.7 % — ABNORMAL LOW (ref 36.0–46.0)
HEMATOCRIT: 27.4 % — AB (ref 36.0–46.0)
Hemoglobin: 9.2 g/dL — ABNORMAL LOW (ref 12.0–15.0)
Hemoglobin: 9.2 g/dL — ABNORMAL LOW (ref 12.0–15.0)
MCH: 30.8 pg (ref 26.0–34.0)
MCH: 30.9 pg (ref 26.0–34.0)
MCHC: 33.2 g/dL (ref 30.0–36.0)
MCHC: 33.6 g/dL (ref 30.0–36.0)
MCV: 92.6 fL (ref 80.0–100.0)
MCV: 91.9 fL (ref 80.0–100.0)
Platelets: 90 10*3/uL — ABNORMAL LOW (ref 150–400)
Platelets: 106 10*3/uL — ABNORMAL LOW (ref 150–400)
RBC: 2.99 MIL/uL — ABNORMAL LOW (ref 3.87–5.11)
RBC: 2.98 MIL/uL — ABNORMAL LOW (ref 3.87–5.11)
RDW: 15.5 % (ref 11.5–15.5)
RDW: 15.4 % (ref 11.5–15.5)
WBC: 11.9 10*3/uL — AB (ref 4.0–10.5)
WBC: 11.9 10*3/uL — AB (ref 4.0–10.5)
nRBC: 0 % (ref 0.0–0.2)
nRBC: 0 % (ref 0.0–0.2)

## 2018-05-19 LAB — MAGNESIUM
Magnesium: 2.2 mg/dL (ref 1.7–2.4)
Magnesium: 2.2 mg/dL (ref 1.7–2.4)

## 2018-05-19 MED ORDER — CHLORHEXIDINE GLUCONATE CLOTH 2 % EX PADS
6.0000 | MEDICATED_PAD | Freq: Every day | CUTANEOUS | Status: DC
  Administered 2018-05-21 – 2018-05-28 (×7): 6 via TOPICAL

## 2018-05-19 MED ORDER — ALTEPLASE 2 MG IJ SOLR: 2.0000 mg | Freq: Once | INTRAMUSCULAR | Status: DC | PRN

## 2018-05-19 MED ORDER — HEPARIN SODIUM (PORCINE) 1000 UNIT/ML IJ SOLN
INTRAMUSCULAR | Status: AC
  Filled 2018-05-19: qty 5

## 2018-05-19 MED ORDER — PENTAFLUOROPROP-TETRAFLUOROETH EX AERO: 1.0000 "application " | INHALATION_SPRAY | CUTANEOUS | Status: DC | PRN

## 2018-05-19 MED ORDER — LIDOCAINE-PRILOCAINE 2.5-2.5 % EX CREA
1.0000 "application " | TOPICAL_CREAM | CUTANEOUS | Status: DC | PRN
  Filled 2018-05-19: qty 5

## 2018-05-19 MED ORDER — SODIUM CHLORIDE 0.9 % IV SOLN: 100.0000 mL | INTRAVENOUS | Status: DC | PRN

## 2018-05-19 MED ORDER — LIDOCAINE HCL (PF) 1 % IJ SOLN: 5.0000 mL | INTRAMUSCULAR | Status: DC | PRN

## 2018-05-19 MED ORDER — HEPARIN SODIUM (PORCINE) 1000 UNIT/ML DIALYSIS
1000.0000 [IU] | INTRAMUSCULAR | Status: DC | PRN
  Administered 2018-05-23: 1000 [IU] via INTRAVENOUS_CENTRAL
  Filled 2018-05-19 (×2): qty 1

## 2018-05-19 NOTE — Progress Notes (Signed)
Patient ID: Gwendolyn Pham, female   DOB: [birth year], 63 y.o.   MRN: [id number]  Progress Note    05/19/2018 1:08 PM 6 Days Post-Op  Subjective: The patient is now extubated.  Answers questions and following commands.  She is confused.  She reports that she is hungry.   Vitals:   05/19/18 1102 05/19/18 1200  BP:  (!) 150/67  Pulse:  (!) 53  Resp:  (!) 26  Temp: 98.3 F (36.8 C)   SpO2:  100%   Physical Exam: Abdomen soft and nontender.  Midline VAC in place Palpable dorsalis pedis pulse bilaterally  CBC    Component Value Date/Time   WBC 11.9 (H) 05/19/2018 0446   RBC 2.99 (L) 05/19/2018 0446   HGB 9.2 (L) 05/19/2018 0446   HGB 15.8 01/23/2018 1218   HCT 27.7 (L) 05/19/2018 0446   HCT 45.9 01/23/2018 1218   PLT 90 (L) 05/19/2018 0446   PLT 253 01/23/2018 1218   MCV 92.6 05/19/2018 0446   MCV 93 01/23/2018 1218   MCH 30.8 05/19/2018 0446   MCHC 33.2 05/19/2018 0446   RDW 15.5 05/19/2018 0446   RDW 13.7 01/23/2018 1218   LYMPHSABS 1.1 04/25/2018 1802   LYMPHSABS 2.8 01/23/2018 1218   MONOABS 0.3 04/26/2018 1802   EOSABS 0.0 05/05/2018 1802   EOSABS 0.1 01/23/2018 1218   BASOSABS 0.0 05/12/2018 1802   BASOSABS 0.1 01/23/2018 1218    BMET    Component Value Date/Time   NA 140 05/19/2018 0958   NA 137 01/23/2018 1218   K 4.0 05/19/2018 0958   CL 111 05/19/2018 0958   CO2 21 (L) 05/19/2018 0958   GLUCOSE 149 (H) 05/19/2018 0958   BUN 49 (H) 05/19/2018 0958   BUN 11 01/23/2018 1218   CREATININE 2.72 (H) 05/19/2018 0958   CREATININE 0.66 06/18/2015 1855   CALCIUM 8.0 (L) 05/19/2018 0958   GFRNONAA 18 (L) 05/19/2018 0958   GFRNONAA >89 01/31/2014 1532   GFRAA 21 (L) 05/19/2018 0958   GFRAA >89 01/31/2014 1532    INR    Component Value Date/Time   INR 1.4 (H) 05/12/2018 0415     Intake/Output Summary (Last 24 hours) at 05/19/2018 1308 Last data filed at 05/19/2018 1200 Gross per 24 hour  Intake 2015.86 ml  Output 1450 ml  Net 565.86 ml      Assessment/Plan:  63 y.o. female status post aortobifemoral bypass postop day 9.  Had a complication of probable diffuse atheroemboli with infarction of small and large bowel and renal failure.  Now extubated.  Remains confused.  Continue to mobilize as much as possible.     Larina Earthly, MD FACS [facility] [phone] 05/19/2018 1:08 PM

## 2018-05-19 NOTE — Evaluation (Signed)
Clinical/Bedside Swallow Evaluation Patient Details  Name: Gwendolyn Pham MRN: [id number] Date of Birth: 08/01/1955  Today's Date: 05/19/2018 Time: SLP Start Time (ACUTE ONLY): 0849 SLP Stop Time (ACUTE ONLY): 0902 SLP Time Calculation (min) (ACUTE ONLY): 13 min  Past Medical History:  Past Medical History:  Diagnosis Date  . Anxiety   . Asthma   . COPD (chronic obstructive pulmonary disease) (HCC)   . Depression   . Diabetes mellitus without complication (HCC)   . Hyperlipidemia   . Hypertension    Past Surgical History:  Past Surgical History:  Procedure Laterality Date  . AORTA - BILATERAL FEMORAL ARTERY BYPASS GRAFT N/A 05/04/2018   Procedure: AORTA BIFEMORAL BYPASS GRAFT;  Surgeon: Larina Earthly, MD;  Location: [facility] OR;  Service: Vascular;  Laterality: N/A;  . BOWEL RESECTION  05/07/2018   Procedure: Resection Small Bowel, LEFT AND RIGHT COLON;  Surgeon: Abigail Miyamoto, MD;  Location: [facility] OR;  Service: General;;  . COLOSTOMY N/A 2018-06-03   Procedure: COLOSTOMY;  Surgeon: Violeta Gelinas, MD;  Location: [facility] OR;  Service: General;  Laterality: N/A;  . DILATION AND CURETTAGE OF UTERUS    . INNER EAR SURGERY    . LAPAROTOMY N/A 05/04/2018   Procedure: EMERGENCY EXPLORATORY LAPAROTOMY;  Surgeon: Cephus Shelling, MD;  Location: [facility] OR;  Service: Vascular;  Laterality: N/A;  . LAPAROTOMY N/A 06-03-18   Procedure: EXPLORATORY LAPAROTOMY ILEOCOLONIC ANASTOMOS CLOSURE OF ABDOMEN;  Surgeon: Violeta Gelinas, MD;  Location: [facility] OR;  Service: General;  Laterality: N/A;   HPI:  Pt is a 63 y.o. female who was admitted 2/26 for aorto femoral bypass.  2/27 had hypotension and respiratory distress.  Intubated that night, fould to be in septic shock, underwent emergent exploratory laparotomy, small bowel resection, bilateral colectomy. Extubated on 3/5 in am.  PMH: HTN, HLD, DM, depression, COPD, asthma, anxiety   Assessment / Plan / Recommendation Clinical Impression  Pt is at a high risk for  aspiration given prolonged extubation and AMS. She requires Mod-Max cues for sustained attention and frequently tries to talk with POs in her mouth. Up to Max cues are needed to initiate a swallow with ice chips, and when she does, she coughs. Small amounts of water are swallowed more timely as they need less oral preparation, but she has an even stronger cough response. In between boluses she is spitting out secretions, both wet and dried. Recommend that she remain NPO for now. SLP will f/u for readiness to start POs and/or participate in instrumental swallow study as mentation clears. SLP Visit Diagnosis: Dysphagia, unspecified (R13.10)    Aspiration Risk  Moderate aspiration risk;Severe aspiration risk    Diet Recommendation NPO;Alternative means - temporary   Medication Administration: Via alternative means    Other  Recommendations Oral Care Recommendations: Oral care QID Other Recommendations: Have oral suction available   Follow up Recommendations (tba)      Frequency and Duration min 2x/week  2 weeks       Prognosis Prognosis for Safe Diet Advancement: Good Barriers to Reach Goals: Cognitive deficits      Swallow Study   General HPI: Pt is a 63 y.o. female who was admitted 2/26 for aorto femoral bypass.  2/27 had hypotension and respiratory distress.  Intubated that night, fould to be in septic shock, underwent emergent exploratory laparotomy, small bowel resection, bilateral colectomy. Extubated on 3/5 in am.  PMH: HTN, HLD, DM, depression, COPD, asthma, anxiety Type of Study: Bedside Swallow Evaluation Previous Swallow Assessment:  none in chart Diet Prior to this Study: NPO;NG Tube Temperature Spikes Noted: No Respiratory Status: Room air History of Recent Intubation: Yes Length of Intubations (days): 8 days Date extubated: 05/18/18 Behavior/Cognition: Alert;Distractible;Requires cueing Oral Cavity Assessment: Dry Oral Care Completed by SLP: Recent completion by  staff Oral Cavity - Dentition: Edentulous;Dentures, not available Patient Positioning: Upright in bed Baseline Vocal Quality: Hoarse;Low vocal intensity(mild) Volitional Cough: Weak Volitional Swallow: Able to elicit    Oral/Motor/Sensory Function Overall Oral Motor/Sensory Function: Generalized oral weakness   Ice Chips Ice chips: Impaired Presentation: Spoon Oral Phase Impairments: Poor awareness of bolus Oral Phase Functional Implications: Prolonged oral transit Pharyngeal Phase Impairments: Cough - Immediate   Thin Liquid Thin Liquid: Impaired Presentation: Spoon Pharyngeal  Phase Impairments: Cough - Immediate    Nectar Thick Nectar Thick Liquid: Not tested   Honey Thick Honey Thick Liquid: Not tested   Puree Puree: Not tested   Solid     Solid: Not tested      Gwendolyn Pham 05/19/2018,9:11 AM  Ivar Drape, M.A. CCC-SLP Acute Herbalist [phone] Office [phone]

## 2018-05-19 NOTE — Progress Notes (Signed)
Rehab Admissions Coordinator Note:  Per PT recommendation, patient was screened by Stephania Fragmin for appropriateness for an Inpatient Acute Rehab Consult.  At this time, we are recommending Inpatient Rehab consult.  Will contact MD to request Inpatient Rehab Consult Order. Please contact me with questions.   Stephania Fragmin 05/19/2018, 11:33 AM  I can be reached at [phone].

## 2018-05-19 NOTE — Progress Notes (Addendum)
[facility] NEPHROLOGY PROGRESS NOTE  Assessment/ Plan: Pt is a 63 y.o. yo female admitted for aortofemoral bypass, had hypotension and respiratory failure, necrotic bowel requiring ex lap, intubated anuric AKI requiring CRRT.  #Oliguric AKI presumably due to ischemic ATN due to sepsis, ABLA and necrotic bowel: No significant urine output.  She was on CRRT from 2/29-3/5 with temporary catheter.  Patient remains anuric and had mild hyperkalemia this morning.  Plan for intermittent hemodialysis today. BP acceptable. Avoid nephrotoxins, IV contrast.  #Hypophosphatemia: Phosphorus level acceptable.  #Acute respiratory failure on mechanical ventilation, per PCCM: Patient is extubated.  #Septic shock due to ischemic and necrotic bowel: Off pressors.  Pressure improving.  #PAD status post aortobifem bypass on 05/09/2018.  #Ischemic bowel status post small bowel resection and right and left colectomy with colostomy: Per surgery team.  #Thrombocytopenia: no systemic heparin with dialysis.  Discussed with the patient's nurse and patient's sister.  Subjective: Seen and examined in iCU.  No urine output.  Mild hyperkalemia, K6.1 early morning.  Patient remains confused.  Objective Vital signs in last 24 hours: Vitals:   05/19/18 0500 05/19/18 0619 05/19/18 0700 05/19/18 0726  BP:  (!) 122/56 (!) 143/65   Pulse:  (!) 54 (!) 50   Resp:      Temp:    98.3 F (36.8 C)  TempSrc:    Oral  SpO2:  99% 100%   Weight: 62.1 kg     Height:       Weight change: 0.8 kg  Intake/Output Summary (Last 24 hours) at 05/19/2018 0855 Last data filed at 05/19/2018 0700 Gross per 24 hour  Intake 1710.52 ml  Output 1642 ml  Net 68.52 ml       Labs: Basic Metabolic Panel: Recent Labs  Lab 05/18/18 1527 05/19/18 0446 05/19/18 0642  NA 138 135 142  K 3.9 6.1* 3.9  CL 110 105 110  CO2 19* 19* 21*  GLUCOSE 134* 226* 124*  BUN 22 43* 45*  CREATININE 1.33* 2.50* 2.52*  CALCIUM 6.5*  7.6* 7.9*  PHOS 3.2 8.1* 3.1   Liver Function Tests: Recent Labs  Lab 05/14/18 0504  05/16/18 0500  05/18/18 0356 05/18/18 1527 05/19/18 0446 05/19/18 0642  AST 667*  --  409*  --  222*  --   --   --   ALT 575*  --  327*  --  172*  --   --   --   ALKPHOS 135*  --  202*  --  154*  --   --   --   BILITOT 4.0*  --  2.9*  --  2.0*  --   --   --   PROT 4.6*  --  5.4*  --  5.5*  --   --   --   ALBUMIN 1.7*   < > 1.6*  1.6*   < > 1.6*  1.6* 1.4* 1.7* 1.7*   < > = values in this interval not displayed.   No results for input(s): LIPASE, AMYLASE in the last 168 hours. Recent Labs  Lab 05/12/18 0957  AMMONIA 20   CBC: Recent Labs  Lab 05/14/18 0504  05/16/18 0500 05/17/18 0754 05/18/18 0356 05/19/18 0446  WBC 6.8  --  12.4* 12.4* 10.7* 11.9*  HGB 9.7*   < > 10.2* 9.6* 9.8* 9.2*  HCT 28.7*   < > 30.6* 28.5* 28.4* 27.7*  MCV 90.8  --  90.5 90.8 91.6 92.6  PLT 64*  --  76* 85* 82* 90*   < > = values in this interval not displayed.   Cardiac Enzymes: No results for input(s): CKTOTAL, CKMB, CKMBINDEX, TROPONINI in the last 168 hours. CBG: Recent Labs  Lab 05/18/18 1533 05/18/18 1949 05/18/18 2326 05/19/18 0350 05/19/18 0727  GLUCAP 117* 125* 118* 116* 119*    Iron Studies: No results for input(s): IRON, TIBC, TRANSFERRIN, FERRITIN in the last 72 hours. Studies/Results: Dg Chest Port 1 View  Result Date: 05/18/2018 CLINICAL DATA:  ETT, respiratory failure EXAM: PORTABLE CHEST 1 VIEW COMPARISON:  05/17/2018 FINDINGS: Endotracheal tube terminates 6 cm above the carina. Platelike opacity in the right lung base, likely scarring/atelectasis. Left basilar atelectasis, mildly improved. No frank interstitial edema or focal consolidation. No pleural effusion or pneumothorax. Right IJ venous sheath in the proximal SVC. Left IJ dual lumen dialysis catheter terminating in the proximal SVC. Enteric tube coursing into the stomach. The heart is normal in size. IMPRESSION: Endotracheal  tube terminates 6 cm above the carina. Additional support apparatus as above. Bibasilar scarring/atelectasis, mildly improved. Electronically Signed   By: Charline Bills M.D.   On: 05/18/2018 09:25    Medications: Infusions: . sodium chloride 10 mL/hr at 05/14/18 0700  . dextrose 5 % and 0.9% NaCl 50 mL/hr at 05/19/18 0700  . feeding supplement (VITAL AF 1.2 CAL) Stopped (05/19/18 0100)    Scheduled Medications: . atorvastatin  80 mg Per Tube Daily  . Chlorhexidine Gluconate Cloth  6 each Topical Daily  . feeding supplement (PRO-STAT SUGAR FREE 64)  30 mL Per Tube TID  . Gerhardt's butt cream   Topical BID  . insulin aspart  0-9 Units Subcutaneous Q4H  . mouth rinse  15 mL Mouth Rinse BID  . metoprolol tartrate  5 mg Intravenous Q8H  . pantoprazole (PROTONIX) IV  40 mg Intravenous Daily  . sodium chloride flush  10-40 mL Intracatheter Q12H    have reviewed scheduled and prn medications.  Physical Exam: General:  intubated, some agitation. Heart: Regular rate rhythm S1-S2 normal, no rubs Lungs: Clear bilateral, no wheezing Abdomen: Ex lap incision site with dressing applied, no bleeding Extremities:No edema Dialysis Access: Temporary catheter.  No bleeding  Dron Prasad Bhandari 05/19/2018,8:55 AM  LOS: 9 days

## 2018-05-19 NOTE — Progress Notes (Signed)
NAME:  Gwendolyn Pham, MRN:  [id number], DOB:  04-07-1955, LOS: 9 ADMISSION DATE:  June 04, 2018, CONSULTATION DATE:  04/16/2018 REFERRING MD:  Chestine Spore  CHIEF COMPLAINT:  Hypotension   Brief History   Gwendolyn Pham is a 63 y.o. female who was admitted 2/26 for aorto femoral bypass.  2/27 had hypotension and respiratory distress.  Intubated that night and getting transfused 2u PRBC.  Vascular surgery following and planning to take back to OR if hypotension does not resolve with blood products.  History of present illness   Pt is encephelopathic; therefore, this HPI is obtained from chart review. Gwendolyn Pham is a 63 y.o. female who has a PMH as outlined below (see "past medical history").  She presented to [facility]/26 for aortofemoral bypass due to occlusive disease.  Procedure was completed without complications.  During afternoon hours of 2/27, she sat up in bed to try and use the commode and shortly thereafter, she became hypotensive.  She was given 3L IVF bolus but BP remained soft.  Hgb had dropped from 11.1 to 8.6.  Vascular surgery was called who felt that pt needed to be intubated while further workup continued.  PCCM was called in consultation.  At the time of our evaluation, pt is awake but is in distress.  She is pale appearing.  SBP in the 80s.  She has had very low UOP, essentially anuric.  She is currently receiving 1u PRBC.  Past Medical History  PVD, HTN, HLD, DM, depression, COPD, asthma, anxiety.  Significant Hospital Events   2/26 > admit, taken to OR for aortofemoral bypass. 2/27 > intubated.  Septic shock 2/27 > emergent exploratory laparotomy, small bowel resection, bilateral colectomy  Consults:  PCCM. General surgery  Procedures:  ETT 2/27 > 3/5 Art line 2/27 > 3/5  Significant Diagnostic Tests:  CXR 2/27 > mild atelectasis. Renal US 2/28 > Head CT 2/28 >> chronically advanced cerebral white matter disease, no acute abnormality Renal ultrasound 2/28 >> no acute findings,  no postobstructive process CXR 3/3> bilateral lower lobe opacities R>L  Micro Data:  Blood 2/28 >> NGTD   Antimicrobials:  Meropenem 2/28 >>  Vancomycin 2/26 >>3/4  Interim history/subjective:  Tolerated extubation overnight, no new events and no new complaints  Objective:  Blood pressure (!) 144/70, pulse (!) 51, temperature 98.3 F (36.8 C), temperature source Oral, resp. rate 16, height  (1.626 m), weight 62.1 kg, SpO2 100 %.        Intake/Output Summary (Last 24 hours) at 05/19/2018 0951 Last data filed at 05/19/2018 0900 Gross per 24 hour  Intake 1943.18 ml  Output 1450 ml  Net 493.18 ml   Filed Weights   05/17/18 0500 05/18/18 0500 05/19/18 0500  Weight: 69 kg 61.3 kg 62.1 kg    Examination: General: Chronically ill appearing female, NAD Neuro: Alert and interactive, moving all ext to command HEENT: [place]/AT, PERRL, EOM-I and MMM Cardiovascular: RRR, Nl S1/S2 and -M/R/G Lungs: CTA bilaterally Abdomen: Soft, NT, ND and +BS  Musculoskeletal: symmetrical bulk and tone.  Skin: Ecchymosis scattered on abdomen, pubis. Clean, dry, warm.   I reviewed CXR myself, mild edema noted  Resolved Problems:  Discussed with bedside RN  Assessment & Plan:   Acute respiratory failure -septic shock, ischemic bowel  P OOB PT Titrate O2 for sat of 88-92% Monitor for airway protection   Bowel ischemia, status post bowel resection 2/27, closure 2/29 Wound care and follow-up care as per CCS recommendations TF per  surgery  Peripheral vascular disease, aortobifemoral bypass 2/26 As per vascular surgery management No anticoagulation at this time, defer to VVS with regard to timing SCDs only for VTE ppx   Septic shock, source consistent with ischemic bowel, possible peritonitis, improved Cultures negative D/Ced vanc D/C merrem D/C pressors  Acute renal failure, presumed ATN oliguric Lactic acidosis, anion gap metabolic acidosis, improved Hyperkalemia HD per  renal BMET Replace electrolytes as indicated  Transaminitis - presumed due to hypotension / low flow.  Improving from 3/1 Trend LFT  Anemia - as above, concern for hemorrhage but reassuring eval on exploratory laparotomy Thrombocytopenia - unclear etiology, suspect due to sepsis plus possible component of consumption.  DIC panel with adequate fibrinogen, no schistocytes Received 2 units PRBC,  Plan: Goal hgb>7 in absence of active bleed Trend H/H  Hx HTN, HLD. Lopressor and lipitor PRN labetalol but off for now due to hypotension, may need to restart  Hx DM. Holding metformin Sliding-scale insulin per protocol  Hx depression, anxiety. Bupropion, venlafaxine being held at this time  PCCM will sign off, please call back if needed  Labs   CBC: Recent Labs  Lab 05/14/18 0504 05/16/18 0344 05/16/18 0500 05/17/18 0754 05/18/18 0356 05/19/18 0446  WBC 6.8  --  12.4* 12.4* 10.7* 11.9*  HGB 9.7* 9.9* 10.2* 9.6* 9.8* 9.2*  HCT 28.7* 29.0* 30.6* 28.5* 28.4* 27.7*  MCV 90.8  --  90.5 90.8 91.6 92.6  PLT 64*  --  76* 85* 82* 90*   Basic Metabolic Panel: Recent Labs  Lab 05/16/18 0500  05/16/18 2030  05/17/18 1521 05/18/18 0356 05/18/18 1527 05/19/18 0446 05/19/18 0641 05/19/18 0642  NA 136   < >  --    < > 137 137 138 135  --  142  K 3.9   < >  --    < > 4.0 3.7 3.9 6.1*  --  3.9  CL 104   < >  --    < > 106 101 110 105  --  110  CO2 24   < >  --    < > 24 23 19* 19*  --  21*  GLUCOSE 108*   < >  --    < > 143* 139* 134* 226*  --  124*  BUN 9   < >  --    < > 14 15 22  43*  --  45*  CREATININE 1.32*   < >  --    < > 1.40* 1.22* 1.33* 2.50*  --  2.52*  CALCIUM 7.9*   < >  --    < > 7.3* 8.0* 6.5* 7.6*  --  7.9*  MG 2.5*  --  2.5*  --   --  2.3  --  2.2 2.2  --   PHOS 1.8*   < >  --    < > 2.5 1.5* 3.2 8.1*  --  3.1   < > = values in this interval not displayed.   GFR: Estimated Creatinine Clearance: 19.7 mL/min (A) (by C-G formula based on SCr of 2.52 mg/dL  (H)). Recent Labs  Lab 05/12/18 0957  04/26/2018 0507 05/14/18 0504 05/16/18 0500 05/17/18 0754 05/18/18 0356 05/19/18 0446  WBC  --    < > 9.5 6.8 12.4* 12.4* 10.7* 11.9*  LATICACIDVEN 3.8*  --  2.3* 1.7  --   --   --   --    < > = values in this interval not displayed.  Liver Function Tests: Recent Labs  Lab 05/12/18 1014  05/14/18 0504  05/16/18 0500  05/17/18 1521 05/18/18 0356 05/18/18 1527 05/19/18 0446 05/19/18 0642  AST 621*  --  667*  --  409*  --   --  222*  --   --   --   ALT 520*  --  575*  --  327*  --   --  172*  --   --   --   ALKPHOS 45  --  135*  --  202*  --   --  154*  --   --   --   BILITOT 2.0*  --  4.0*  --  2.9*  --   --  2.0*  --   --   --   PROT 4.3*  --  4.6*  --  5.4*  --   --  5.5*  --   --   --   ALBUMIN 2.4*   < > 1.7*   < > 1.6*  1.6*   < > 1.5* 1.6*  1.6* 1.4* 1.7* 1.7*   < > = values in this interval not displayed.   No results for input(s): LIPASE, AMYLASE in the last 168 hours. Recent Labs  Lab 05/12/18 0957  AMMONIA 20   ABG    Component Value Date/Time   PHART 7.496 (H) 05/18/2018 0405   PCO2ART 32.6 05/18/2018 0405   PO2ART 98.3 05/18/2018 0405   HCO3 25.0 05/18/2018 0405   TCO2 26 05/16/2018 0344   ACIDBASEDEF 5.0 (H) 05/08/2018 0927   O2SAT 97.4 05/18/2018 0405    Coagulation Profile: No results for input(s): INR, PROTIME in the last 168 hours. Cardiac Enzymes: No results for input(s): CKTOTAL, CKMB, CKMBINDEX, TROPONINI in the last 168 hours. HbA1C: Hgb A1c MFr Bld  Date/Time Value Ref Range Status  05/02/2018 10:28 AM 6.4 (H) 4.8 - 5.6 % Final    Comment:    (NOTE)         Prediabetes: 5.7 - 6.4         Diabetes: >6.4         Glycemic control for adults with diabetes: <7.0   01/23/2018 12:18 PM 6.4 (H) 4.8 - 5.6 % Final    Comment:             Prediabetes: 5.7 - 6.4          Diabetes: >6.4          Glycemic control for adults with diabetes: <7.0    CBG: Recent Labs  Lab 05/18/18 1533 05/18/18 1949  05/18/18 2326 05/19/18 0350 05/19/18 0727  GLUCAP 117* 125* 118* 116* 119*   Alyson Reedy, M.D. [facility] Pulmonary/Critical Care Medicine. Pager: [phone]. After hours pager: [phone].

## 2018-05-19 NOTE — Consult Note (Signed)
WOC Nurse wound follow up Wound type: surgical  Measurement:18cm x 4cm x 2.5cm  Wound UUV:[id number] tissue, some visible sutures in the base just distal to the umbilicus Drainage (amount, consistency, odor) minimal, no odor Periwound: intact, ecchymosis over the abdomen that is scattered Dressing procedure/placement/frequency: Removed old NPWT dressing Periwound skin protected with skin barrier Filled wound with 1 piece of black foam. Sealed NPWT dressing at HG Patient received IV pain medication per bedside nurse prior to dressing change Patient tolerated procedure well, patient is awake Ok for bedside nurse to change non-complex NPWT dressing M/W/F   WOC nurse will continue to provide NPWT dressing changes with ostomy assessments    WOC Nurse ostomy follow up Stoma type/location: LLQ, transverse colostomy Stomal assessment/size: 1 1/4" x 2cm, oval shaped stoma, pink, moist.  Os noted to be at skin level at 6 o'clock  Peristomal assessment: intact Treatment options for stomal/peristomal skin: using 2" barrier ring to aid in seal Output liquid green brown Ostomy pouching: 2pc. 2 3/4"  Education provided:  None, patient is just extubated. She is confused at time. She can follow commands. No family in the room. Patient is not able to be taught.  Enrolled patient in [place] [employer] Discharge program: No   WOC nurse team will follow along for you for support with wound and ostomy care Steffany Schoenfelder [facility], CNS, CWON-AP [phone]

## 2018-05-19 NOTE — Evaluation (Signed)
Physical Therapy Evaluation Patient Details Name: Gwendolyn Pham MRN: [id number] DOB: [birth year] Today's Date: 05/19/2018   History of Present Illness  Pt is a 63 year old woman admitted 04/26/2018 with aortic occlusion, now s/p aortobifemoral BPG.  2/27 had hypotension and respiratory distress.  Intubated that night.  Extubated 05/18/18.  PMH: smoker, anxiety, depression, DM, asthma, HTN.  Clinical Impression  Pt admitted with above diagnosis. Pt currently with functional limitations due to the deficits listed below (see PT Problem List). Pt was able to sit EOB with min assist for 15 min.  Pt weak but was participatory.  Confused at times needing reorientation.  Will follow acutely.  Pt was independent PTA therefore feel that pt is a good rehab candidate.   Pt will benefit from skilled PT to increase their independence and safety with mobility to allow discharge to the venue listed below.      Follow Up Recommendations CIR;Supervision/Assistance - 24 hour    Equipment Recommendations  Rolling walker with 5" wheels;3in1 (PT)    Recommendations for Other Services Rehab consult     Precautions / Restrictions Precautions Precautions: Fall Precaution Comments: VAC abdomen, Cortrak Restrictions Weight Bearing Restrictions: No      Mobility  Bed Mobility Overal bed mobility: Needs Assistance Bed Mobility: Rolling;Sidelying to Sit Rolling: Min assist Sidelying to sit: Min assist       General bed mobility comments: educated in log roll technique while splinting abdomen with pillow to minimize abdominal pain  Transfers Overall transfer level: Needs assistance Equipment used: 2 person hand held assist Transfers: Sit to/from [employer] Sit to Stand: Mod assist;+2 physical assistance;From elevated surface         General transfer comment: assist for stability as pt could not stand all the way up after initiating movement, sat abruptly, cues for technique, increased  time  Ambulation/Gait                Stairs            Wheelchair Mobility    Modified Rankin (Stroke Patients Only)       Balance Overall balance assessment: Needs assistance Sitting-balance support: Bilateral upper extremity supported;Feet supported Sitting balance-Leahy Scale: Poor Sitting balance - Comments: Needed UE support at times for balance and min assist for safety. Did some LE exercises while sitting.  Sat a total of 15 min EOB. Postural control: Posterior lean Standing balance support: Bilateral upper extremity supported;During functional activity Standing balance-Leahy Scale: Zero Standing balance comment: Could not stand fully upright with +2 assist                              Pertinent Vitals/Pain Pain Assessment: Faces Faces Pain Scale: Hurts even more Pain Location: abdomen Pain Descriptors / Indicators: Aching;Grimacing;Guarding Pain Intervention(s): Limited activity within patient's tolerance;Monitored during session;Repositioned    Home Living Family/patient expects to be discharged to:: Private residence Living Arrangements: Alone Available Help at Discharge: Family;Available 24 hours/day(sister but unsure if 24 hour care) Type of Home: House Home Access: Stairs to enter Entrance Stairs-Rails: Right Entrance Stairs-Number of Steps: 5 Home Layout: One level Home Equipment: None Additional Comments: lives with her dog and cat    Prior Function Level of Independence: Independent               Hand Dominance   Dominant Hand: Right    Extremity/Trunk Assessment   Upper Extremity Assessment Upper Extremity Assessment: Defer  to OT evaluation    Lower Extremity Assessment Lower Extremity Assessment: Generalized weakness    Cervical / Trunk Assessment Cervical / Trunk Assessment: Kyphotic  Communication   Communication: No difficulties  Cognition Arousal/Alertness: Awake/alert Behavior During Therapy:  Anxious;Flat affect;Impulsive;Restless Overall Cognitive Status: Impaired/Different from baseline Area of Impairment: Safety/judgement;Following commands;Problem solving;Attention;Orientation                 Orientation Level: Disoriented to;Place;Time;Situation(Thought she was in [facility], did not know in hospital) Current Attention Level: Sustained   Following Commands: Follows one step commands with increased time Safety/Judgement: Decreased awareness of safety;Decreased awareness of deficits   Problem Solving: Slow processing;Decreased initiation;Difficulty sequencing;Requires verbal cues;Requires tactile cues General Comments: Some impulsivity noted.      General Comments      Exercises General Exercises - Lower Extremity Ankle Circles/Pumps: AROM;5 reps;Both;Supine Long Arc Quad: AROM;Both;10 reps;Seated   Assessment/Plan    PT Assessment Patient needs continued PT services  PT Problem List Decreased activity tolerance;Decreased balance;Decreased mobility;Pain;Cardiopulmonary status limiting activity;Decreased safety awareness;Decreased knowledge of use of DME       PT Treatment Interventions DME instruction;Gait training;Stair training;Functional mobility training;Therapeutic activities;Therapeutic exercise;Balance training;Patient/family education    PT Goals (Current goals can be found in the Care Plan section)  Acute Rehab PT Goals Patient Stated Goal: to return home to her pets, pain relief PT Goal Formulation: Patient unable to participate in goal setting Time For Goal Achievement: 06/02/18 Potential to Achieve Goals: Good    Frequency Min 2X/week   Barriers to discharge Decreased caregiver support      Co-evaluation               AM-PAC PT "6 Clicks" Mobility  Outcome Measure Help needed turning from your back to your side while in a flat bed without using bedrails?: A Lot Help needed moving from lying on your back to sitting on the side of  a flat bed without using bedrails?: A Lot Help needed moving to and from a bed to a chair (including a wheelchair)?: A Lot Help needed standing up from a chair using your arms (e.g., wheelchair or bedside chair)?: Total Help needed to walk in hospital room?: Total Help needed climbing 3-5 steps with a railing? : Total 6 Click Score: 9    End of Session Equipment Utilized During Treatment: Oxygen;Gait belt Activity Tolerance: Patient limited by fatigue Patient left: with call bell/phone within reach;in bed;with bed alarm set;with nursing/sitter in room Nurse Communication: Mobility status PT Visit Diagnosis: Unsteadiness on feet (R26.81);Pain;Difficulty in walking, not elsewhere classified (R26.2) Pain - part of body: (abdomen)    Time: 2831-5176 PT Time Calculation (min) (ACUTE ONLY): 16 min   Charges:   PT Evaluation $PT Eval Moderate Complexity: 1 Mod          Rishika Mccollom,PT Acute Rehabilitation Services Pager:  [phone]  Office:  [phone]    Berline Lopes 05/19/2018, 10:55 AM

## 2018-05-20 LAB — RENAL FUNCTION PANEL
ANION GAP: 6 (ref 5–15)
Albumin: 1.7 g/dL — ABNORMAL LOW (ref 3.5–5.0)
Albumin: 1.7 g/dL — ABNORMAL LOW (ref 3.5–5.0)
Albumin: 1.7 g/dL — ABNORMAL LOW (ref 3.5–5.0)
Anion gap: 8 (ref 5–15)
Anion gap: 7 (ref 5–15)
BUN: 22 mg/dL (ref 8–23)
BUN: 33 mg/dL — ABNORMAL HIGH (ref 8–23)
BUN: 48 mg/dL — ABNORMAL HIGH (ref 8–23)
CALCIUM: 7.8 mg/dL — AB (ref 8.9–10.3)
CHLORIDE: 111 mmol/L (ref 98–111)
CO2: 25 mmol/L (ref 22–32)
CO2: 24 mmol/L (ref 22–32)
CO2: 22 mmol/L (ref 22–32)
Calcium: 7.7 mg/dL — ABNORMAL LOW (ref 8.9–10.3)
Calcium: 7.9 mg/dL — ABNORMAL LOW (ref 8.9–10.3)
Chloride: 105 mmol/L (ref 98–111)
Chloride: 107 mmol/L (ref 98–111)
Creatinine, Ser: 1.64 mg/dL — ABNORMAL HIGH (ref 0.44–1.00)
Creatinine, Ser: 2.1 mg/dL — ABNORMAL HIGH (ref 0.44–1.00)
Creatinine, Ser: 2.73 mg/dL — ABNORMAL HIGH (ref 0.44–1.00)
GFR calc Af Amer: 38 mL/min — ABNORMAL LOW (ref >60)
GFR calc Af Amer: 28 mL/min — ABNORMAL LOW (ref >60)
GFR calc non Af Amer: 33 mL/min — ABNORMAL LOW (ref >60)
GFR calc non Af Amer: 24 mL/min — ABNORMAL LOW (ref >60)
GFR, EST AFRICAN AMERICAN: 21 mL/min — AB (ref >60)
GFR, EST NON AFRICAN AMERICAN: 18 mL/min — AB (ref >60)
GLUCOSE: 148 mg/dL — AB (ref 70–99)
GLUCOSE: 165 mg/dL — AB (ref 70–99)
Glucose, Bld: 148 mg/dL — ABNORMAL HIGH (ref 70–99)
Phosphorus: 1.7 mg/dL — ABNORMAL LOW (ref 2.5–4.6)
Phosphorus: 2.2 mg/dL — ABNORMAL LOW (ref 2.5–4.6)
Phosphorus: 2.8 mg/dL (ref 2.5–4.6)
Potassium: 3.1 mmol/L — ABNORMAL LOW (ref 3.5–5.1)
Potassium: 3.1 mmol/L — ABNORMAL LOW (ref 3.5–5.1)
Potassium: 3.5 mmol/L (ref 3.5–5.1)
Sodium: 138 mmol/L (ref 135–145)
Sodium: 137 mmol/L (ref 135–145)
Sodium: 140 mmol/L (ref 135–145)

## 2018-05-20 LAB — GLUCOSE, CAPILLARY
GLUCOSE-CAPILLARY: 109 mg/dL — AB (ref 70–99)
Glucose-Capillary: 152 mg/dL — ABNORMAL HIGH (ref 70–99)
Glucose-Capillary: 153 mg/dL — ABNORMAL HIGH (ref 70–99)
Glucose-Capillary: 134 mg/dL — ABNORMAL HIGH (ref 70–99)
Glucose-Capillary: 113 mg/dL — ABNORMAL HIGH (ref 70–99)

## 2018-05-20 LAB — HEPATITIS B SURFACE ANTIGEN: HEP B S AG: NEGATIVE

## 2018-05-20 LAB — HEPATITIS B CORE ANTIBODY, IGM: Hep B C IgM: NEGATIVE

## 2018-05-20 LAB — HEPATITIS B SURFACE ANTIBODY,QUALITATIVE: Hep B S Ab: REACTIVE

## 2018-05-20 MED ORDER — POTASSIUM CHLORIDE 20 MEQ/15ML (10%) PO SOLN
40.0000 meq | Freq: Once | ORAL | Status: AC
  Administered 2018-05-20: 40 meq via ORAL
  Filled 2018-05-20: qty 30

## 2018-05-20 MED ORDER — LOPERAMIDE HCL 1 MG/7.5ML PO SUSP
2.0000 mg | Freq: Two times a day (BID) | ORAL | Status: DC
  Administered 2018-05-20 (×2): 2 mg via ORAL
  Filled 2018-05-20 (×3): qty 15

## 2018-05-20 NOTE — Progress Notes (Signed)
  Speech Language Pathology Treatment: Dysphagia  Patient Details Name: Gwendolyn Pham MRN: [id number] DOB: [birth year] Today's Date: 05/20/2018 Time: 1443-1540 SLP Time Calculation (min) (ACUTE ONLY): 10 min  Assessment / Plan / Recommendation Clinical Impression  Pt's remains a high risk for aspiration, with both immediate and delayed coughing observed with ice chips and small amounts of water. Multiple swallows are noted per bolus. Pt needs Mod-Max cues for sustained attention, bolus awareness. She often  lets her mouth sit open with bolus inside, not making attempts to swallow unless cued to do so. She talks with the bolus in her oral cavity and has the outward of premature spillage into the pharynx/larynx as coughing ensues. Recommend that she remain NPO with additional SLP.   HPI HPI: Pt is a 63 y.o. female who was admitted 2/26 for aorto femoral bypass.  2/27 had hypotension and respiratory distress.  Intubated that night, fould to be in septic shock, underwent emergent exploratory laparotomy, small bowel resection, bilateral colectomy. Extubated on 3/5 in am.  PMH: HTN, HLD, DM, depression, COPD, asthma, anxiety      SLP Plan  Continue with current plan of care       Recommendations  Diet recommendations: NPO Medication Administration: Via alternative means                Oral Care Recommendations: Oral care QID Follow up Recommendations: Inpatient Rehab SLP Visit Diagnosis: Dysphagia, unspecified (R13.10) Plan: Continue with current plan of care       GO                [name] Annick Dimaio 05/20/2018, 8:53 AM  Ivar Drape, M.A. CCC-SLP Acute Herbalist [phone] Office [phone]

## 2018-05-20 NOTE — Progress Notes (Signed)
[facility]/Trauma Progress Note  7 Days Post-Op   Assessment/Plan AIOD with aortic occlusion - S/PAortobifemoral bypass wth 14 x 8 Hemashield graft, 04/17/2018, Dr. Arbie Cookey Ischemic distal small bowel, right and left colon ischemia: - S/Pex lap with right and left colon resection, small bowel resection 2/27 Magnus Ivan)- S/Pileo-transverse colon anastomosis and left end colostomy 2/29 Janee Morn) - midline with wound vac - good stool output, 1700cc in last 24hrs, will start imodium    FEN:TF's @ goal VTE: SCD's,heparin for CRRT, holding CRRT for the weekend per Nephrology JO:INOM stopped 03/04, Merrem once 03/06, WBC 11.9, monitor, afebrile Foley:yes Follow up:TBD  DISPO:continue TF's, wound vac, we will see again Monday. Imodium for high stool output. K replacement per nephrology      LOS: 10 days    Subjective: CC: no complaints   She is off vent, alert and awake. She denies abdominal pain, nausea or vomiting. No family at bedside.   Objective: Vital signs in last 24 hours: Temp:  [97.7 F (36.5 C)-99.1 F (37.3 C)] 98.9 F (37.2 C) (03/07 0746) Pulse Rate:  [45-126] 53 (03/07 0700) Resp:  [13-32] 22 (03/07 0700) BP: (97-159)/(54-118) 131/118 (03/07 0700) SpO2:  [96 %-100 %] 100 % (03/07 0700) Weight:  [64.1 kg] 64.1 kg (03/07 0148) Last BM Date: 05/19/18  Intake/Output from previous day: 03/06 0701 - 03/07 0700 In: 1791.7 [I.V.:1141.9; NG/GT:550; IV Piggyback:99.9] Out: 2702 [Stool:1700] Intake/Output this shift: Total I/O In: 50 [NG/GT:50] Out: 300 [Stool:300]  PE: Gen:ill appearing, NAD, awake and alert, following commands Card: RRR Pulm:rate and effort normal  Abd: Soft, ND,+BS,stoma appears viable with gasand brown liquidstool in bag. Midlinewith wound vac in place, no TTP Skin: warm and dry   Anti-infectives: Anti-infectives (From admission, onward)   Start     Dose/Rate Route Frequency Ordered Stop   05/19/18 0900   meropenem (MERREM) 500 mg in sodium chloride 0.9 % 100 mL IVPB     500 mg 200 mL/hr over 30 Minutes Intravenous  Once 05/18/18 0956 05/19/18 0825   05/17/18 1130  vancomycin (VANCOCIN) IVPB 750 mg/150 ml premix  Status:  Discontinued     750 mg 150 mL/hr over 60 Minutes Intravenous Every 24 hours 05/17/18 0733 05/17/18 0919   05/16/18 0900  vancomycin (VANCOCIN) IVPB 750 mg/150 ml premix  Status:  Discontinued     750 mg 150 mL/hr over 60 Minutes Intravenous Every 24 hours 05/16/18 0745 05/17/18 0733   05/15/18 0800  vancomycin (VANCOCIN) IVPB 750 mg/150 ml premix  Status:  Discontinued     750 mg 150 mL/hr over 60 Minutes Intravenous Every 24 hours 05/14/18 0916 05/16/18 0745   06/07/18 2200  meropenem (MERREM) 1 g in sodium chloride 0.9 % 100 mL IVPB  Status:  Discontinued     1 g 200 mL/hr over 30 Minutes Intravenous Every 12 hours 06/07/18 1714 05/18/18 0956   05/12/18 1115  vancomycin (VANCOCIN) 1,250 mg in sodium chloride 0.9 % 250 mL IVPB     1,250 mg 166.7 mL/hr over 90 Minutes Intravenous  Once 05/12/18 1109 05/12/18 1348   05/12/18 1115  meropenem (MERREM) 500 mg in sodium chloride 0.9 % 100 mL IVPB  Status:  Discontinued     500 mg 200 mL/hr over 30 Minutes Intravenous Every 12 hours 05/12/18 1109 06/07/2018 1714   05/12/18 1109  vancomycin variable dose per unstable renal function (pharmacist dosing)  Status:  Discontinued      Does not apply See admin instructions 05/12/18 1109 05/14/18 0900  05/02/2018 1800  vancomycin (VANCOCIN) IVPB 1000 mg/200 mL premix     1,000 mg 200 mL/hr over 60 Minutes Intravenous Every 12 hours 04/26/2018 1357 2018/05/17 1759   04/15/2018 0540  vancomycin (VANCOCIN) IVPB 1000 mg/200 mL premix     1,000 mg 200 mL/hr over 60 Minutes Intravenous 60 min pre-op 04/26/2018 0540 04/18/2018 1504      Lab Results:  Recent Labs    05/19/18 0446 05/19/18 1352  WBC 11.9* 11.9*  HGB 9.2* 9.2*  HCT 27.7* 27.4*  PLT 90* 106*   BMET Recent Labs     05/19/18 2220 05/20/18 0504  NA 138 137  K 3.1* 3.1*  CL 105 107  CO2 25 24  GLUCOSE 148* 165*  BUN 22 33*  CREATININE 1.64* 2.10*  CALCIUM 7.8* 7.7*   PT/INR No results for input(s): LABPROT, INR in the last 72 hours. CMP     Component Value Date/Time   NA 137 05/20/2018 0504   NA 137 01/23/2018 1218   K 3.1 (L) 05/20/2018 0504   CL 107 05/20/2018 0504   CO2 24 05/20/2018 0504   GLUCOSE 165 (H) 05/20/2018 0504   BUN 33 (H) 05/20/2018 0504   BUN 11 01/23/2018 1218   CREATININE 2.10 (H) 05/20/2018 0504   CREATININE 0.66 06/18/2015 1855   CALCIUM 7.7 (L) 05/20/2018 0504   PROT 5.5 (L) 05/18/2018 0356   PROT 7.2 01/23/2018 1218   ALBUMIN 1.7 (L) 05/20/2018 0504   ALBUMIN 4.3 01/23/2018 1218   AST 222 (H) 05/18/2018 0356   ALT 172 (H) 05/18/2018 0356   ALKPHOS 154 (H) 05/18/2018 0356   BILITOT 2.0 (H) 05/18/2018 0356   BILITOT 0.2 01/23/2018 1218   GFRNONAA 24 (L) 05/20/2018 0504   GFRNONAA >89 01/31/2014 1532   GFRAA 28 (L) 05/20/2018 0504   GFRAA >89 01/31/2014 1532   Lipase  No results found for: LIPASE  Studies/Results: No results found.    Jerre Simon , [facility] 05/20/2018, 8:03 AM  Pager: [phone] Mon-Wed, Friday 7:00am-4:30pm Thurs 7am-11:30am  Consults: [phone]

## 2018-05-20 NOTE — Progress Notes (Signed)
[facility] NEPHROLOGY PROGRESS NOTE  Assessment/ Plan: Pt is a 63 y.o. yo female admitted for aortofemoral bypass, had hypotension and respiratory failure, necrotic bowel requiring ex lap, intubated anuric AKI requiring CRRT.  #Oliguric AKI presumably due to ischemic ATN due to sepsis, ABLA and necrotic bowel: No significant urine output.  She was on CRRT from 2/29-3/5 with temporary catheter. -Received intermittent hemodialysis on 3/6, tolerated well.  Patient remains anuric.  Volume status acceptable.  High output from colostomy.  Plan to hold off dialysis this weekend and to continue to watch for renal recovery. - Avoid nephrotoxins, IV contrast.  #Hypophosphatemia: Phosphorus level acceptable.  #Hypokalemia: Repleted a dose of potassium chloride.  #Acute respiratory failure on mechanical ventilation, per PCCM: Patient is extubated.  #Septic shock due to ischemic and necrotic bowel: Off pressors.  Pressure improving.  #PAD status post aortobifem bypass on 04/27/2018.  #Ischemic bowel status post small bowel resection and right and left colectomy with colostomy: Per surgery team.  #Thrombocytopenia: no systemic heparin with dialysis.  Discussed with the patient's nurse and patient's sister.  Subjective: Seen and examined in iCU.  Remains anuric.  She is alert awake and following some commands.  Off pressor.  Objective Vital signs in last 24 hours: Vitals:   05/20/18 0500 05/20/18 0600 05/20/18 0700 05/20/18 0746  BP: (!) 97/59 118/67 (!) 131/118   Pulse: (!) 54 (!) 50 (!) 53   Resp: (!) 27 13 (!) 22   Temp:    98.9 F (37.2 C)  TempSrc:    Oral  SpO2: 96% 100% 100%   Weight:      Height:       Weight change: 2 kg  Intake/Output Summary (Last 24 hours) at 05/20/2018 0834 Last data filed at 05/20/2018 0758 Gross per 24 hour  Intake 1729.56 ml  Output 3002 ml  Net -1272.44 ml       Labs: Basic Metabolic Panel: Recent Labs  Lab 05/19/18 0958  05/19/18 2220 05/20/18 0504  NA 140 138 137  K 4.0 3.1* 3.1*  CL 111 105 107  CO2 21* 25 24  GLUCOSE 149* 148* 165*  BUN 49* 22 33*  CREATININE 2.72* 1.64* 2.10*  CALCIUM 8.0* 7.8* 7.7*  PHOS 3.0 1.7* 2.2*   Liver Function Tests: Recent Labs  Lab 05/14/18 0504  05/16/18 0500  05/18/18 0356  05/19/18 0958 05/19/18 2220 05/20/18 0504  AST 667*  --  409*  --  222*  --   --   --   --   ALT 575*  --  327*  --  172*  --   --   --   --   ALKPHOS 135*  --  202*  --  154*  --   --   --   --   BILITOT 4.0*  --  2.9*  --  2.0*  --   --   --   --   PROT 4.6*  --  5.4*  --  5.5*  --   --   --   --   ALBUMIN 1.7*   < > 1.6*  1.6*   < > 1.6*  1.6*   < > 1.7* 1.7* 1.7*   < > = values in this interval not displayed.   No results for input(s): LIPASE, AMYLASE in the last 168 hours. No results for input(s): AMMONIA in the last 168 hours. CBC: Recent Labs  Lab 05/16/18 0500 05/17/18 0754 05/18/18 0356 05/19/18 0446 05/19/18 1352  WBC 12.4* 12.4* 10.7* 11.9* 11.9*  HGB 10.2* 9.6* 9.8* 9.2* 9.2*  HCT 30.6* 28.5* 28.4* 27.7* 27.4*  MCV 90.5 90.8 91.6 92.6 91.9  PLT 76* 85* 82* 90* 106*   Cardiac Enzymes: No results for input(s): CKTOTAL, CKMB, CKMBINDEX, TROPONINI in the last 168 hours. CBG: Recent Labs  Lab 05/19/18 0727 05/19/18 1104 05/19/18 1949 05/19/18 2332 05/20/18 0353  GLUCAP 119* 141* 149* 134* 152*    Iron Studies: No results for input(s): IRON, TIBC, TRANSFERRIN, FERRITIN in the last 72 hours. Studies/Results: No results found.  Medications: Infusions: . sodium chloride 10 mL/hr at 05/14/18 0700  . sodium chloride    . sodium chloride    . dextrose 5 % and 0.9% NaCl 50 mL/hr at 05/20/18 0700  . feeding supplement (VITAL AF 1.2 CAL) 50 mL/hr at 05/20/18 0100    Scheduled Medications: . atorvastatin  80 mg Per Tube Daily  . Chlorhexidine Gluconate Cloth  6 each Topical Daily  . Chlorhexidine Gluconate Cloth  6 each Topical Q0600  . feeding supplement  (PRO-STAT SUGAR FREE 64)  30 mL Per Tube TID  . Gerhardt's butt cream   Topical BID  . insulin aspart  0-9 Units Subcutaneous Q4H  . loperamide HCl  2 mg Oral BID  . mouth rinse  15 mL Mouth Rinse BID  . metoprolol tartrate  5 mg Intravenous Q8H  . pantoprazole (PROTONIX) IV  40 mg Intravenous Daily  . potassium chloride  40 mEq Oral Once  . sodium chloride flush  10-40 mL Intracatheter Q12H    have reviewed scheduled and prn medications.  Physical Exam: General: Not in distress, comfortable Heart: Regular rate rhythm S1-S2 normal, no rubs Lungs: Clear bilateral, no wheezing Abdomen: Ex lap incision site with dressing applied, no bleeding Extremities: No edema Dialysis Access: Temporary catheter.  No bleeding   Prasad  05/20/2018,8:34 AM  LOS: 10 days

## 2018-05-20 NOTE — Progress Notes (Signed)
[facility]  Subjective  - Pleasantly confused.   Objective (!) 131/118 (!) 53 98.9 F (37.2 C) (Oral) (!) 22 100%  Intake/Output Summary (Last 24 hours) at 05/20/2018 0933 Last data filed at 05/20/2018 0758 Gross per 24 hour  Intake 1566.94 ml  Output 2602 ml  Net -1035.06 ml    ABd: soft, ND, NT, midline vac with good seal Bilateral groin incisions healing without issue Palpable pedal pulses bilaterally   Laboratory Lab Results: Recent Labs    05/19/18 0446 05/19/18 1352  WBC 11.9* 11.9*  HGB 9.2* 9.2*  HCT 27.7* 27.4*  PLT 90* 106*   BMET Recent Labs    05/19/18 2220 05/20/18 0504  NA 138 137  K 3.1* 3.1*  CL 105 107  CO2 25 24  GLUCOSE 148* 165*  BUN 22 33*  CREATININE 1.64* 2.10*  CALCIUM 7.8* 7.7*    COAG Lab Results  Component Value Date   INR 1.4 (H) 05/12/2018   INR 1.5 (H) 04/21/2018   INR 1.1 04/27/2018   No results found for: PTT  Assessment/Planning: 63 y.o. female status post aortobifemoral bypass postop day 10.  Atheroemboli complication with infarction of small/large bowel and renal failure.  N: Pleasantly confused, no deficits. CV: Hemodynamically stable, Hgb 9.2 to 9.2 on last re-check GI: Tolerating enteral tube feeds, colostomy productive, abdomen soft Renal: CRRT, remains anuric FEN: Nephrology repleted dose of potassium ID: afebrile  Possibly move to floor tomorrow if stable today.    Cephus Shelling 05/20/2018 9:33 AM --

## 2018-05-21 ENCOUNTER — Encounter (HOSPITAL_COMMUNITY): Payer: Self-pay | Admitting: *Deleted

## 2018-05-21 LAB — GLUCOSE, CAPILLARY
GLUCOSE-CAPILLARY: 149 mg/dL — AB (ref 70–99)
Glucose-Capillary: 156 mg/dL — ABNORMAL HIGH (ref 70–99)
Glucose-Capillary: 165 mg/dL — ABNORMAL HIGH (ref 70–99)
Glucose-Capillary: 134 mg/dL — ABNORMAL HIGH (ref 70–99)
Glucose-Capillary: 138 mg/dL — ABNORMAL HIGH (ref 70–99)
Glucose-Capillary: 157 mg/dL — ABNORMAL HIGH (ref 70–99)

## 2018-05-21 LAB — RENAL FUNCTION PANEL
Albumin: 1.6 g/dL — ABNORMAL LOW (ref 3.5–5.0)
Anion gap: 9 (ref 5–15)
BUN: 61 mg/dL — ABNORMAL HIGH (ref 8–23)
CO2: 18 mmol/L — ABNORMAL LOW (ref 22–32)
Calcium: 7.7 mg/dL — ABNORMAL LOW (ref 8.9–10.3)
Chloride: 114 mmol/L — ABNORMAL HIGH (ref 98–111)
Creatinine, Ser: 3.18 mg/dL — ABNORMAL HIGH (ref 0.44–1.00)
GFR calc Af Amer: 17 mL/min — ABNORMAL LOW (ref >60)
GFR, EST NON AFRICAN AMERICAN: 15 mL/min — AB (ref >60)
Glucose, Bld: 170 mg/dL — ABNORMAL HIGH (ref 70–99)
Phosphorus: 3.5 mg/dL (ref 2.5–4.6)
Potassium: 3.6 mmol/L (ref 3.5–5.1)
Sodium: 141 mmol/L (ref 135–145)

## 2018-05-21 MED ORDER — LOPERAMIDE HCL 1 MG/7.5ML PO SUSP
4.0000 mg | Freq: Two times a day (BID) | ORAL | Status: DC
  Administered 2018-05-21 – 2018-05-22 (×3): 4 mg via ORAL
  Filled 2018-05-21 (×3): qty 30

## 2018-05-21 NOTE — Progress Notes (Signed)
[facility] NEPHROLOGY PROGRESS NOTE  Assessment/ Plan: Pt is a 63 y.o. yo female admitted for aortofemoral bypass, had hypotension and respiratory failure, necrotic bowel requiring ex lap, intubated anuric AKI requiring CRRT.  #Oliguric AKI presumably due to ischemic ATN due to sepsis, ABLA and necrotic bowel: No significant urine output.  She was on CRRT from 2/29-3/5 with temporary catheter. -Received intermittent hemodialysis on 3/6, tolerated well.  Patient remains oliguric.  Volume status and electrolytes acceptable.  High output from colostomy.  Plan to hold off dialysis this weekend and to continue to watch for renal recovery. -Assess in the morning to decide if patient needs dialysis. - Avoid nephrotoxins, IV contrast.  #Hypophosphatemia: Phosphorus level acceptable.  #Hypokalemia: Potassium acceptable today.Marland Kitchen  #Acute respiratory failure on mechanical ventilation, per PCCM: Patient is extubated.  #Septic shock due to ischemic and necrotic bowel: Off pressors.  Pressure improving.  #PAD status post aortobifem bypass on 05/09/2018.  #Ischemic bowel status post small bowel resection and right and left colectomy with colostomy: Per surgery team.  #Thrombocytopenia: no systemic heparin with dialysis.  Discussed with the patient's nurse and patient's sister.  Subjective: Seen and examined in iCU.  Remains oliguric.  Patient is alert awake and calm this morning.  Not in pressure.  Objective Vital signs in last 24 hours: Vitals:   05/21/18 0600 05/21/18 0700 05/21/18 0728 05/21/18 0800  BP: 138/64 119/62  135/70  Pulse: 62 (!) 50  (!) 56  Resp: (!) 24 (!) 24  (!) 21  Temp:   98.1 F (36.7 C)   TempSrc:   Oral   SpO2: 99% 98%  98%  Weight:      Height:       Weight change: -0.596 kg  Intake/Output Summary (Last 24 hours) at 05/21/2018 0930 Last data filed at 05/21/2018 0800 Gross per 24 hour  Intake 3511.77 ml  Output 1550 ml  Net 1961.77 ml        Labs: Basic Metabolic Panel: Recent Labs  Lab 05/20/18 0504 05/20/18 1611 05/21/18 0449  NA 137 140 141  K 3.1* 3.5 3.6  CL 107 111 114*  CO2 24 22 18*  GLUCOSE 165* 148* 170*  BUN 33* 48* 61*  CREATININE 2.10* 2.73* 3.18*  CALCIUM 7.7* 7.9* 7.7*  PHOS 2.2* 2.8 3.5   Liver Function Tests: Recent Labs  Lab 05/16/18 0500  05/18/18 0356  05/20/18 0504 05/20/18 1611 05/21/18 0449  AST 409*  --  222*  --   --   --   --   ALT 327*  --  172*  --   --   --   --   ALKPHOS 202*  --  154*  --   --   --   --   BILITOT 2.9*  --  2.0*  --   --   --   --   PROT 5.4*  --  5.5*  --   --   --   --   ALBUMIN 1.6*  1.6*   < > 1.6*  1.6*   < > 1.7* 1.7* 1.6*   < > = values in this interval not displayed.   No results for input(s): LIPASE, AMYLASE in the last 168 hours. No results for input(s): AMMONIA in the last 168 hours. CBC: Recent Labs  Lab 05/16/18 0500 05/17/18 0754 05/18/18 0356 05/19/18 0446 05/19/18 1352  WBC 12.4* 12.4* 10.7* 11.9* 11.9*  HGB 10.2* 9.6* 9.8* 9.2* 9.2*  HCT 30.6* 28.5* 28.4* 27.7*  27.4*  MCV 90.5 90.8 91.6 92.6 91.9  PLT 76* 85* 82* 90* 106*   Cardiac Enzymes: No results for input(s): CKTOTAL, CKMB, CKMBINDEX, TROPONINI in the last 168 hours. CBG: Recent Labs  Lab 05/20/18 1600 05/20/18 1935 05/21/18 0004 05/21/18 0417 05/21/18 0730  GLUCAP 134* 113* 149* 156* 165*    Iron Studies: No results for input(s): IRON, TIBC, TRANSFERRIN, FERRITIN in the last 72 hours. Studies/Results: No results found.  Medications: Infusions: . sodium chloride 10 mL/hr at 05/14/18 0700  . sodium chloride    . sodium chloride    . dextrose 5 % and 0.9% NaCl 50 mL/hr at 05/21/18 0800  . feeding supplement (VITAL AF 1.2 CAL) 1,000 mL (05/20/18 1351)    Scheduled Medications: . atorvastatin  80 mg Per Tube Daily  . Chlorhexidine Gluconate Cloth  6 each Topical Daily  . Chlorhexidine Gluconate Cloth  6 each Topical Q0600  . feeding supplement  (PRO-STAT SUGAR FREE 64)  30 mL Per Tube TID  . Gerhardt's butt cream   Topical BID  . insulin aspart  0-9 Units Subcutaneous Q4H  . loperamide HCl  4 mg Oral BID  . mouth rinse  15 mL Mouth Rinse BID  . metoprolol tartrate  5 mg Intravenous Q8H  . pantoprazole (PROTONIX) IV  40 mg Intravenous Daily  . sodium chloride flush  10-40 mL Intracatheter Q12H    have reviewed scheduled and prn medications.  Physical Exam: General: Vertebral, lying in bed, calm Heart: Regular rate rhythm S1-S2 normal, no rubs Lungs: Clear bilateral, no wheezing Abdomen: Ex lap incision site with dressing applied, no bleeding Extremities: No edema Dialysis Access: Temporary catheter.  No bleeding   Prasad  05/21/2018,9:30 AM  LOS: 11 days

## 2018-05-21 NOTE — Progress Notes (Signed)
[facility]  Subjective  - Pleasantly confused again today.   Objective 135/70 (!) 56 98.1 F (36.7 C) (Oral) (!) 21 98%  Intake/Output Summary (Last 24 hours) at 05/21/2018 0914 Last data filed at 05/21/2018 0800 Gross per 24 hour  Intake 3511.77 ml  Output 1550 ml  Net 1961.77 ml    ABd: soft, ND, NT, midline vac with good seal Bilateral groin incisions healing without issue Palpable pedal pulses bilaterally   Laboratory Lab Results: Recent Labs    05/19/18 0446 05/19/18 1352  WBC 11.9* 11.9*  HGB 9.2* 9.2*  HCT 27.7* 27.4*  PLT 90* 106*   BMET Recent Labs    05/20/18 1611 05/21/18 0449  NA 140 141  K 3.5 3.6  CL 111 114*  CO2 22 18*  GLUCOSE 148* 170*  BUN 48* 61*  CREATININE 2.73* 3.18*  CALCIUM 7.9* 7.7*    COAG Lab Results  Component Value Date   INR 1.4 (H) 05/12/2018   INR 1.5 (H) 04/18/2018   INR 1.1 Jun 01, 2018   No results found for: PTT  Assessment/Planning: 63 y.o. female status post aortobifemoral bypass postop day 11.  Atheroemboli complication with infarction of small/large bowel and renal failure.  N: Pleasantly confused, no deficits. CV: Hemodynamically stable GI: Tolerating enteral tube feeds, colostomy productive, abdomen soft, imodium started yesterday for higher output 1800+ from colostomy Renal: nephrology holding on CRRT this weekend, UOP 50 mL yesterday FEN: Nephrology repleted dose of potassium, lytes ok today ID: afebrile, no active infectious concerns  Stable in ICU - will move to floor today.  Cephus Shelling 05/21/2018 9:13 AM --

## 2018-05-22 LAB — GLUCOSE, CAPILLARY
Glucose-Capillary: 143 mg/dL — ABNORMAL HIGH (ref 70–99)
Glucose-Capillary: 164 mg/dL — ABNORMAL HIGH (ref 70–99)
Glucose-Capillary: 136 mg/dL — ABNORMAL HIGH (ref 70–99)
Glucose-Capillary: 99 mg/dL (ref 70–99)
Glucose-Capillary: 138 mg/dL — ABNORMAL HIGH (ref 70–99)
Glucose-Capillary: 134 mg/dL — ABNORMAL HIGH (ref 70–99)

## 2018-05-22 LAB — CBC
HCT: 27.1 % — ABNORMAL LOW (ref 36.0–46.0)
Hemoglobin: 8.5 g/dL — ABNORMAL LOW (ref 12.0–15.0)
MCH: 30.1 pg (ref 26.0–34.0)
MCHC: 31.4 g/dL (ref 30.0–36.0)
MCV: 96.1 fL (ref 80.0–100.0)
NRBC: 0 % (ref 0.0–0.2)
Platelets: 197 10*3/uL (ref 150–400)
RBC: 2.82 MIL/uL — ABNORMAL LOW (ref 3.87–5.11)
RDW: 15.7 % — ABNORMAL HIGH (ref 11.5–15.5)
WBC: 13.1 10*3/uL — ABNORMAL HIGH (ref 4.0–10.5)

## 2018-05-22 LAB — RENAL FUNCTION PANEL
Albumin: 1.7 g/dL — ABNORMAL LOW (ref 3.5–5.0)
Anion gap: 8 (ref 5–15)
BUN: 87 mg/dL — ABNORMAL HIGH (ref 8–23)
CO2: 19 mmol/L — ABNORMAL LOW (ref 22–32)
Calcium: 8.1 mg/dL — ABNORMAL LOW (ref 8.9–10.3)
Chloride: 118 mmol/L — ABNORMAL HIGH (ref 98–111)
Creatinine, Ser: 4.38 mg/dL — ABNORMAL HIGH (ref 0.44–1.00)
GFR calc Af Amer: 12 mL/min — ABNORMAL LOW (ref >60)
GFR calc non Af Amer: 10 mL/min — ABNORMAL LOW (ref >60)
Glucose, Bld: 191 mg/dL — ABNORMAL HIGH (ref 70–99)
Phosphorus: 5.3 mg/dL — ABNORMAL HIGH (ref 2.5–4.6)
Potassium: 4 mmol/L (ref 3.5–5.1)
Sodium: 145 mmol/L (ref 135–145)

## 2018-05-22 MED ORDER — LOPERAMIDE HCL 1 MG/7.5ML PO SUSP
4.0000 mg | Freq: Three times a day (TID) | ORAL | Status: DC
  Filled 2018-05-22: qty 30

## 2018-05-22 MED ORDER — LOPERAMIDE HCL 1 MG/7.5ML PO SUSP
4.0000 mg | Freq: Three times a day (TID) | ORAL | Status: DC
  Administered 2018-05-22 – 2018-06-03 (×34): 4 mg
  Filled 2018-05-22 (×41): qty 30

## 2018-05-22 NOTE — Progress Notes (Addendum)
SLP Cancellation Note  Patient Details Name: Gwendolyn Pham MRN: [id number] DOB: Jul 15, 1955   Cancelled treatment:       Reason Eval/Treat Not Completed: Patient unavailable - currently with other providers. Will f/u as able.  UPDATE: Attempted to see pt again this afternoon but her colostomy is leaking. Nursing currently cleaning the pt and getting new dressings. Will continue efforts.   Gwendolyn Pham 05/22/2018, 10:23 AM  Ivar Drape, M.A. CCC-SLP Acute Herbalist [phone] Office [phone]

## 2018-05-22 NOTE — Progress Notes (Signed)
Report called at this time to [place], RN on 4E

## 2018-05-22 NOTE — Progress Notes (Signed)
Colostomy bag leaking. Spoke with Melody (wound RN). She recommended using "Flexible Convex" colostomy bag [employer] [place] [phone]) with a barrier ring [facility] [place] [phone]). Colostomy bag changed to recommended. Had to change drape of mid abdominal wound vac due to soiling from leaking colostomy bag. Wound vac and colostomy bag now clean, dry, intact.   Leonidas Romberg, RN

## 2018-05-22 NOTE — Progress Notes (Addendum)
Physical Therapy Treatment Patient Details Name: Gwendolyn Pham MRN: [id number] DOB: 05/03/1955 Today's Date: 05/22/2018    History of Present Illness Pt is a 63 y.o. female who was admitted 2/26 for aorto femoral bypass.  2/27 had hypotension and respiratory distress.  Intubated that night, fould to be in septic shock, underwent emergent exploratory laparotomy, small bowel resection, bilateral colectomy. Extubated on 3/5 in am.  PMH: HTN, HLD, DM, depression, COPD, asthma, anxiety.    PT Comments    Patient seen for activity progression. Session limited for patient safety due to confusion. Patient did tolerate EOB and multiple standing attempts. Some hygiene performed during session. Continues to require 2 persons assist. Was completely independent prior to admission as confirmed by sister at bedside. Current recommendations and POC remain appropriate.   Follow Up Recommendations  CIR;Supervision/Assistance - 24 hour     Equipment Recommendations  Rolling walker with 5" wheels;3in1 (PT)    Recommendations for Other Services Rehab consult     Precautions / Restrictions Precautions Precautions: Fall Precaution Comments: VAC abdomen, Cortrak Restrictions Weight Bearing Restrictions: No    Mobility  Bed Mobility Overal bed mobility: Needs Assistance Bed Mobility: Rolling;Sidelying to Sit;Sit to Supine Rolling: Min assist Sidelying to sit: Mod assist   Sit to supine: Min assist   General bed mobility comments: Increased time and effort to perform. Assist to initiate movement to EOb, increased assist to elevate trunk to uprigt at EOB  Transfers Overall transfer level: Needs assistance Equipment used: 2 person hand held assist Transfers: Sit to/from [employer] Sit to Stand: Mod assist;+2 physical assistance;From elevated surface         General transfer comment: Performed x2, increased effort noted for initiation of movement. Patient with noted flexed posture  when transitioning to upright despite multi modal cues to reach erect position  Ambulation/Gait             General Gait Details: unable to perform   Stairs             Wheelchair Mobility    Modified Rankin (Stroke Patients Only)       Balance Overall balance assessment: Needs assistance Sitting-balance support: Bilateral upper extremity supported;Feet supported Sitting balance-Leahy Scale: Poor Sitting balance - Comments: Needed UE support at times for balance and min assist for safety. Did some LE exercises while sitting.  Sat a total of 15 min EOB. Postural control: Posterior lean Standing balance support: Bilateral upper extremity supported;During functional activity Standing balance-Leahy Scale: Zero Standing balance comment: Could not stand fully upright with +2 assist                             Cognition Arousal/Alertness: Awake/alert Behavior During Therapy: Anxious;Flat affect;Impulsive;Restless Overall Cognitive Status: Impaired/Different from baseline Area of Impairment: Safety/judgement;Following commands;Problem solving;Attention;Orientation                 Orientation Level: Disoriented to;Place;Time;Situation(able to select "hospital" from choices of 3, confused) Current Attention Level: Focused   Following Commands: Follows one step commands with increased time Safety/Judgement: Decreased awareness of safety;Decreased awareness of deficits   Problem Solving: Slow processing;Decreased initiation;Difficulty sequencing;Requires verbal cues;Requires tactile cues General Comments: Some impulsivity noted.      Exercises      General Comments        Pertinent Vitals/Pain Pain Assessment: Faces Faces Pain Scale: Hurts little more Pain Location: abdomen Pain Descriptors / Indicators: Aching;Grimacing;Guarding Pain Intervention(s): Monitored during  session;Repositioned    Home Living                      Prior  Function            PT Goals (current goals can now be found in the care plan section) Acute Rehab PT Goals Patient Stated Goal: to return home to her pets, pain relief PT Goal Formulation: Patient unable to participate in goal setting Time For Goal Achievement: 06/02/18 Potential to Achieve Goals: Good Progress towards PT goals: Progressing toward goals    Frequency    Min 2X/week      PT Plan Current plan remains appropriate    Co-evaluation PT/OT/SLP Co-Evaluation/Treatment: Yes Reason for Co-Treatment: For patient/therapist safety PT goals addressed during session: Mobility/safety with mobility OT goals addressed during session: ADL's and self-care      AM-PAC PT "6 Clicks" Mobility   Outcome Measure  Help needed turning from your back to your side while in a flat bed without using bedrails?: A Lot Help needed moving from lying on your back to sitting on the side of a flat bed without using bedrails?: A Lot Help needed moving to and from a bed to a chair (including a wheelchair)?: A Lot Help needed standing up from a chair using your arms (e.g., wheelchair or bedside chair)?: Total Help needed to walk in hospital room?: Total Help needed climbing 3-5 steps with a railing? : Total 6 Click Score: 9    End of Session Equipment Utilized During Treatment: Oxygen;Gait belt Activity Tolerance: Patient limited by fatigue Patient left: with call bell/phone within reach;in bed;with bed alarm set;with nursing/sitter in room Nurse Communication: Mobility status PT Visit Diagnosis: Unsteadiness on feet (R26.81);Pain;Difficulty in walking, not elsewhere classified (R26.2) Pain - part of body: (abdomen)     Time: 6283-6629 PT Time Calculation (min) (ACUTE ONLY): 19 min  Charges:  $Therapeutic Activity: 8-22 mins                     Charlotte Crumb, PT DPT  Board Certified Neurologic Specialist Acute Rehabilitation Services Pager [phone] Office  [phone]    Fabio Asa 05/22/2018, 1:22 PM

## 2018-05-22 NOTE — Plan of Care (Signed)
  Problem: Education: Goal: Knowledge of General Education information will improve Description: Including pain rating scale, medication(s)/side effects and non-pharmacologic comfort measures Outcome: Not Progressing   Problem: Health Behavior/Discharge Planning: Goal: Ability to manage health-related needs will improve Outcome: Not Progressing   

## 2018-05-22 NOTE — Progress Notes (Signed)
Dr. Hyman Hopes contacted for clarification of dialysis treatment date.  Orders received to dialyzed patient on Tuesday, 05-22-2018, not today.

## 2018-05-22 NOTE — Progress Notes (Signed)
[facility] R[facility]ING NOTE   Subjective:   Brief history this 63 year old lady with a history of aortofemoral bypass, hypotension respiratory failure necrotic bowel requiring exploratory laparotomy, intubated anuric kidney injury requiring CRRT.  She has been converted to intermittent hemodialysis and had received treatment 05/19/2018.  Dialysis planned for 05/22/2018  Blood pressure 151/73 pulse 52 temperature 98.2 O2 sats 90% room air Oliguric.  Copious amounts of stool.  Sodium 145 potassium 4.0 chloride 118 CO2 19 BUN 87 creatinine 4.38 glucose 191 calcium 8.1 phosphorus 5.3 albumin 1.7 WBC 13.1 hemoglobin 8.5 platelets 197  Chest x-ray shows an endotracheal tube in place with bilateral scarring and atelectasis.  Atorvastatin 80 mg daily insulin sliding scale Protonix 40 mg IV daily   Objective:  Vital signs in last 24 hours:  Temp:  [97.4 F (36.3 C)-98.5 F (36.9 C)] 98.2 F (36.8 C) (03/09 0735) Pulse Rate:  [29-107] 52 (03/09 0800) Resp:  [15-38] 19 (03/09 0800) BP: (103-159)/(56-96) 151/73 (03/09 0800) SpO2:  [93 %-100 %] 100 % (03/09 0800) Weight:  [63 kg] 63 kg (03/09 0415)  Weight change: -0.454 kg Filed Weights   05/20/18 0148 05/21/18 0540 05/22/18 0415  Weight: 64.1 kg 63.5 kg 63 kg    Intake/Output: I/O last 3 completed shifts: In: 4690.6 [I.V.:1940.6; NG/GT:2750] Out: 3325 [Urine:50; Stool:3275]   Intake/Output this shift:  Total I/O In: 100 [I.V.:50; NG/GT:50] Out: 300 [Stool:300]  CVS- RRR regular rate and rhythm RS- CTA no wheezes or rales ABD- BS present soft non-distended ex lap incision site clean dry dressing colostomy EXT-temporary dialysis catheter left IJ   Basic Metabolic Panel: Recent Labs  Lab 05/16/18 0500  05/16/18 2030  05/18/18 0356  05/19/18 0446 05/19/18 0641  05/19/18 2220 05/20/18 0504 05/20/18 1611 05/21/18 0449 05/22/18 0413  NA 136   < >  --    < > 137   < > 135  --    < > 138 137 140 141 145  K 3.9    < >  --    < > 3.7   < > 6.1*  --    < > 3.1* 3.1* 3.5 3.6 4.0  CL 104   < >  --    < > 101   < > 105  --    < > 105 107 111 114* 118*  CO2 24   < >  --    < > 23   < > 19*  --    < > 25 24 22  18* 19*  GLUCOSE 108*   < >  --    < > 139*   < > 226*  --    < > 148* 165* 148* 170* 191*  BUN 9   < >  --    < > 15   < > 43*  --    < > 22 33* 48* 61* 87*  CREATININE 1.32*   < >  --    < > 1.22*   < > 2.50*  --    < > 1.64* 2.10* 2.73* 3.18* 4.38*  CALCIUM 7.9*   < >  --    < > 8.0*   < > 7.6*  --    < > 7.8* 7.7* 7.9* 7.7* 8.1*  MG 2.5*  --  2.5*  --  2.3  --  2.2 2.2  --   --   --   --   --   --   PHOS 1.8*   < >  --    < >  1.5*   < > 8.1*  --    < > 1.7* 2.2* 2.8 3.5 5.3*   < > = values in this interval not displayed.    Liver Function Tests: Recent Labs  Lab 05/16/18 0500  05/18/18 0356  05/19/18 2220 05/20/18 0504 05/20/18 1611 05/21/18 0449 05/22/18 0413  AST 409*  --  222*  --   --   --   --   --   --   ALT 327*  --  172*  --   --   --   --   --   --   ALKPHOS 202*  --  154*  --   --   --   --   --   --   BILITOT 2.9*  --  2.0*  --   --   --   --   --   --   PROT 5.4*  --  5.5*  --   --   --   --   --   --   ALBUMIN 1.6*  1.6*   < > 1.6*  1.6*   < > 1.7* 1.7* 1.7* 1.6* 1.7*   < > = values in this interval not displayed.   No results for input(s): LIPASE, AMYLASE in the last 168 hours. No results for input(s): AMMONIA in the last 168 hours.  CBC: Recent Labs  Lab 05/17/18 0754 05/18/18 0356 05/19/18 0446 05/19/18 1352 05/22/18 0414  WBC 12.4* 10.7* 11.9* 11.9* 13.1*  HGB 9.6* 9.8* 9.2* 9.2* 8.5*  HCT 28.5* 28.4* 27.7* 27.4* 27.1*  MCV 90.8 91.6 92.6 91.9 96.1  PLT 85* 82* 90* 106* 197    Cardiac Enzymes: No results for input(s): CKTOTAL, CKMB, CKMBINDEX, TROPONINI in the last 168 hours.  BNP: Invalid input(s): POCBNP  CBG: Recent Labs  Lab 05/21/18 1533 05/21/18 2008 05/21/18 2325 05/22/18 0336 05/22/18 0731  GLUCAP 138* 157* 143* 164* 136*     Microbiology: Results for orders placed or performed during the hospital encounter of 04/26/2018  Culture, blood (Routine X 2) w Reflex to ID Panel     Status: None   Collection Time: 05/12/18  3:00 AM  Result Value Ref Range Status   Specimen Description BLOOD LEFT ARM  Final   Special Requests   Final    BOTTLES DRAWN AEROBIC AND ANAEROBIC Blood Culture adequate volume   Culture   Final    NO GROWTH 5 DAYS Performed at [facility], [address], [place], [place] [address]    Report Status 05/17/2018 FINAL  Final  Culture, blood (Routine X 2) w Reflex to ID Panel     Status: None   Collection Time: 05/12/18  3:20 AM  Result Value Ref Range Status   Specimen Description BLOOD LEFT HAND  Final   Special Requests   Final    BOTTLES DRAWN AEROBIC ONLY Blood Culture adequate volume   Culture   Final    NO GROWTH 5 DAYS Performed at [facility], [address] [place], [place] [address]    Report Status 05/17/2018 FINAL  Final    Coagulation Studies: No results for input(s): LABPROT, INR in the last 72 hours.  Urinalysis: No results for input(s): COLORURINE, LABSPEC, PHURINE, GLUCOSEU, HGBUR, BILIRUBINUR, KETONESUR, PROTEINUR, UROBILINOGEN, NITRITE, LEUKOCYTESUR in the last 72 hours.  Invalid input(s): APPERANCEUR    Imaging: No results found.   Medications:   . sodium chloride 10 mL/hr at 05/14/18 0700  .  sodium chloride    . sodium chloride    . feeding supplement (VITAL AF 1.2 CAL) 1,000 mL (05/20/18 1351)   . atorvastatin  80 mg Per Tube Daily  . Chlorhexidine Gluconate Cloth  6 each Topical Daily  . Chlorhexidine Gluconate Cloth  6 each Topical Q0600  . feeding supplement (PRO-STAT SUGAR FREE 64)  30 mL Per Tube TID  . Gerhardt's butt cream   Topical BID  . insulin aspart  0-9 Units Subcutaneous Q4H  . loperamide HCl  4 mg Oral BID  . mouth rinse  15 mL Mouth Rinse BID  . metoprolol tartrate  5 mg Intravenous Q8H  . pantoprazole (PROTONIX)  IV  40 mg Intravenous Daily  . sodium chloride flush  10-40 mL Intracatheter Q12H   sodium chloride, sodium chloride, sodium chloride, acetaminophen **OR** acetaminophen, alteplase, bisacodyl, fentaNYL (SUBLIMAZE) injection, heparin, labetalol, lidocaine (PF), lidocaine-prilocaine, ondansetron, pentafluoroprop-tetrafluoroeth, phenol, sodium chloride flush  Assessment/ Plan:   Oliguric acute kidney injury presumably secondary to acute tubular necrosis from hypotension and possibly sepsis necrotic bowel status post exploratory laparotomy with colostomy placement.  She was on CRRT from 2/29/2022 05/18/2018 with a temporary dialysis catheter placed in the left IJ.  She tolerated hemodialysis 05/19/2018 she has high output stool from her colostomy.  We will plan dialysis treatment 05/22/2018.  Continue to avoid nephrotoxin agents and also IV contrast  Acute respiratory failure mechanical ventilation followed by pulmonology and critical care patient now extubated  History of septic shock due to ischemic and necrotic bowel now off pressors  Peripheral artery disease status post aorto bifemoral bypass 05/07/2018  Ischemic bowel status post small bowel resection and right and left colectomy with colostomy  History of thrombocytopenia no heparin with dialysis    LOS: 12 Gwendolyn Pham @TODAY @8 :57 AM

## 2018-05-22 NOTE — Progress Notes (Signed)
9 Days Post-Op  Subjective: CC:  Patient is confused, but pleasant. Only orientated to self. Nurse reports she has been this way since coming off vent. She thinks she may have had some abdominal pain and nausea but is not sure. No emesis. No family at bedside.  Objective: Vital signs in last 24 hours: Temp:  [97.4 F (36.3 C)-98.5 F (36.9 C)] 98.2 F (36.8 C) (03/09 0735) Pulse Rate:  [29-107] 48 (03/09 0700) Resp:  [15-38] 17 (03/09 0700) BP: (103-159)/(56-96) 125/71 (03/09 0600) SpO2:  [93 %-100 %] 96 % (03/09 0700) Weight:  [93 kg] 63 kg (03/09 0415) Last BM Date: 05/21/18  Intake/Output from previous day: 03/08 0701 - 03/09 0700 In: 3183.6 [I.V.:1193.6; NG/GT:1990] Out: 2275 [Stool:2275] Intake/Output this shift: Total I/O In: 50 [I.V.:50] Out: -   PE: Gen: frail, ill appearing, NAD, awake and alert Card: RRR Lungs: Normal effort Abd: Soft, non-distended, +BS, stoma appears viable with bright red tissue. Present gas and large amount of light brown stool in bag. Midline wound vac in place. Tenderness around midline wound, otherwise NTTP. Msk: no edema, DP intake b/l Skin: warm and dry  Lab Results:  Recent Labs    05/19/18 1352 05/22/18 0414  WBC 11.9* 13.1*  HGB 9.2* 8.5*  HCT 27.4* 27.1*  PLT 106* 197   BMET Recent Labs    05/21/18 0449 05/22/18 0413  NA 141 145  K 3.6 4.0  CL 114* 118*  CO2 18* 19*  GLUCOSE 170* 191*  BUN 61* 87*  CREATININE 3.18* 4.38*  CALCIUM 7.7* 8.1*   PT/INR No results for input(s): LABPROT, INR in the last 72 hours. CMP     Component Value Date/Time   NA 145 05/22/2018 0413   NA 137 01/23/2018 1218   K 4.0 05/22/2018 0413   CL 118 (H) 05/22/2018 0413   CO2 19 (L) 05/22/2018 0413   GLUCOSE 191 (H) 05/22/2018 0413   BUN 87 (H) 05/22/2018 0413   BUN 11 01/23/2018 1218   CREATININE 4.38 (H) 05/22/2018 0413   CREATININE 0.66 06/18/2015 1855   CALCIUM 8.1 (L) 05/22/2018 0413   PROT 5.5 (L) 05/18/2018 0356   PROT 7.2 01/23/2018 1218   ALBUMIN 1.7 (L) 05/22/2018 0413   ALBUMIN 4.3 01/23/2018 1218   AST 222 (H) 05/18/2018 0356   ALT 172 (H) 05/18/2018 0356   ALKPHOS 154 (H) 05/18/2018 0356   BILITOT 2.0 (H) 05/18/2018 0356   BILITOT 0.2 01/23/2018 1218   GFRNONAA 10 (L) 05/22/2018 0413   GFRNONAA >89 01/31/2014 1532   GFRAA 12 (L) 05/22/2018 0413   GFRAA >89 01/31/2014 1532   Lipase  No results found for: LIPASE     Studies/Results: No results found.  Anti-infectives: Anti-infectives (From admission, onward)   Start     Dose/Rate Route Frequency Ordered Stop   05/19/18 0900  meropenem (MERREM) 500 mg in sodium chloride 0.9 % 100 mL IVPB     500 mg 200 mL/hr over 30 Minutes Intravenous  Once 05/18/18 0956 05/19/18 0825   05/17/18 1130  vancomycin (VANCOCIN) IVPB 750 mg/150 ml premix  Status:  Discontinued     750 mg 150 mL/hr over 60 Minutes Intravenous Every 24 hours 05/17/18 0733 05/17/18 0919   05/16/18 0900  vancomycin (VANCOCIN) IVPB 750 mg/150 ml premix  Status:  Discontinued     750 mg 150 mL/hr over 60 Minutes Intravenous Every 24 hours 05/16/18 0745 05/17/18 0733   05/15/18 0800  vancomycin (VANCOCIN) IVPB 750  mg/150 ml premix  Status:  Discontinued     750 mg 150 mL/hr over 60 Minutes Intravenous Every 24 hours 05/14/18 0916 05/16/18 0745   05/08/2018 2200  meropenem (MERREM) 1 g in sodium chloride 0.9 % 100 mL IVPB  Status:  Discontinued     1 g 200 mL/hr over 30 Minutes Intravenous Every 12 hours 04/21/2018 1714 05/18/18 0956   05/12/18 1115  vancomycin (VANCOCIN) 1,250 mg in sodium chloride 0.9 % 250 mL IVPB     1,250 mg 166.7 mL/hr over 90 Minutes Intravenous  Once 05/12/18 1109 05/12/18 1348   05/12/18 1115  meropenem (MERREM) 500 mg in sodium chloride 0.9 % 100 mL IVPB  Status:  Discontinued     500 mg 200 mL/hr over 30 Minutes Intravenous Every 12 hours 05/12/18 1109 05/12/2018 1714   05/12/18 1109  vancomycin variable dose per unstable renal function (pharmacist  dosing)  Status:  Discontinued      Does not apply See admin instructions 05/12/18 1109 05/14/18 0900   19-May-2018 1800  vancomycin (VANCOCIN) IVPB 1000 mg/200 mL premix     1,000 mg 200 mL/hr over 60 Minutes Intravenous Every 12 hours May 19, 2018 1357 04/21/2018 1759   2018-05-19 0540  vancomycin (VANCOCIN) IVPB 1000 mg/200 mL premix     1,000 mg 200 mL/hr over 60 Minutes Intravenous 60 min pre-op 05/19/2018 0540 05-19-18 1504       Assessment/Plan AIOD with aortic occlusion - S/PAortobifemoral bypass wth 14 x 8 Hemashield graft, May 19, 2018, Dr. Arbie Cookey  Ischemic distal small bowel, right and left colon ischemia: - S/Pex lap with right and left colon resection, small bowel resection 2/27 Magnus Ivan)- S/Pileo-transverse colon anastomosis and left end colostomy 2/29 Janee Morn) - midline with wound vac,  - good stool output, 2,275 in last 24hrs, continue imodium   FEN:TF's @ goal VTE: SCD's,heparin for CRRT, CRRT held weekend per Nephrology AQ:[id number] 03/04, Merrem once 03/06,WBCup to 13.1, monitor, afebrile Follow up:TBD   LOS: 12 days    Jacinto Halim , [facility] 05/22/2018, 8:01 AM Pager: [phone]

## 2018-05-22 NOTE — Progress Notes (Signed)
  Speech Language Pathology Treatment: Dysphagia  Patient Details Name: Gwendolyn Pham MRN: [id number] DOB: 1956/03/11 Today's Date: 05/22/2018 Time: 7282-0601 SLP Time Calculation (min) (ACUTE ONLY): 11 min  Assessment / Plan / Recommendation Clinical Impression  Pt needs Min-Mod cues for sustained attention, subsequently demonstrating improved bolus awareness and acceptance. Her voice is sounding stronger, although she still has intermittent coughing with POs offered. Although coughing is present, it is not occurring after every bolus as it was before. She is nearing readiness for MBS. Given that she will go to dialysis on next date, may consider completing swallow study around Wednesday if her mentation continues to improve. SLP will continue to follow. In the meantime, she has alternative means of getting nutrition and medication.   HPI HPI: Pt is a 63 y.o. female who was admitted 2/26 for aorto femoral bypass.  2/27 had hypotension and respiratory distress.  Intubated that night, fould to be in septic shock, underwent emergent exploratory laparotomy, small bowel resection, bilateral colectomy. Extubated on 3/5 in am.  PMH: HTN, HLD, DM, depression, COPD, asthma, anxiety      SLP Plan  Continue with current plan of care       Recommendations  Diet recommendations: NPO Medication Administration: Via alternative means                Oral Care Recommendations: Oral care QID Follow up Recommendations: Inpatient Rehab SLP Visit Diagnosis: Dysphagia, unspecified (R13.10) Plan: Continue with current plan of care       GO                Gwendolyn Pham Gwendolyn Pham 05/22/2018, 5:51 PM  Ivar Drape, M.A. CCC-SLP Acute Herbalist [phone] Office [phone]

## 2018-05-22 NOTE — Evaluation (Signed)
Occupational Therapy Re-Evaluation Patient Details Name: Gwendolyn Pham MRN: [id number] DOB: December 28, 1955 Today's Date: 05/22/2018    History of Present Illness Pt is a 63 year old woman admitted 04/17/2018 with aortic occlusion, now s/p aortobifemoral BPG.  2/27 had hypotension and respiratory distress.  Intubated that night.  Extubated 05/18/18.  CRRT from 2/29/2022 05/18/2018 PMH: smoker, anxiety, depression, DM, asthma, HTN.   Clinical Impression   Pt seen for re-evaluation following medical decline. Pt today is confused, demonstrating cognitive deficits but family present share that her personality is starting to come out more. Pt was able to come EOB with mod A, sit<>stand attempts x2 with max A +2, able to perform sitting at EOB with min to mod A and mod A for grooming task (requires initiation of activities and assist with follow through for task completion. PTA Pt was independent and will benefit from skilled OT at the acute setting and also at CIR level to return to PLOF and independence. Good family support from sister.     Follow Up Recommendations  CIR;Supervision/Assistance - 24 hour    Equipment Recommendations  3 in 1 bedside commode;Other (comment)(defer to next venue of care)    Recommendations for Other Services       Precautions / Restrictions Precautions Precautions: Fall Precaution Comments: NG tube, colostomy, VAC abdomen Restrictions Weight Bearing Restrictions: No      Mobility Bed Mobility Overal bed mobility: Needs Assistance Bed Mobility: Rolling;Sidelying to Sit Rolling: Min assist Sidelying to sit: Mod assist   Sit to supine: Min assist   General bed mobility comments: Increased time and effort to perform. Assist to initiate movement to EOb, increased assist to elevate trunk to uprigt at EOB  Transfers Overall transfer level: Needs assistance Equipment used: 2 person hand held assist Transfers: Sit to/from [employer] Sit to Stand: Mod  assist;+2 physical assistance;From elevated surface         General transfer comment: Performed x2, increased effort noted for initiation of movement. Patient with noted flexed posture when transitioning to upright despite multi modal cues to reach erect position    Balance Overall balance assessment: Needs assistance Sitting-balance support: Bilateral upper extremity supported;Feet supported Sitting balance-Leahy Scale: Poor   Postural control: Posterior lean Standing balance support: Bilateral upper extremity supported;During functional activity Standing balance-Leahy Scale: Zero Standing balance comment: Could not stand fully upright with +2 assist                            ADL either performed or assessed with clinical judgement   ADL Overall ADL's : Needs assistance/impaired Eating/Feeding: NPO   Grooming: Moderate assistance;Sitting;Wash/dry face Grooming Details (indicate cue type and reason): cues to initiate task (verbal and physical)close monitoring due to NG tube Upper Body Bathing: Sitting;Maximal assistance   Lower Body Bathing: Maximal assistance;+2 for physical assistance;+2 for safety/equipment;Sit to/from stand   Upper Body Dressing : Maximal assistance;Sitting   Lower Body Dressing: Maximal assistance;Sit to/from stand;+2 for safety/equipment;+2 for physical assistance   Toilet Transfer: Moderate assistance;+2 for physical assistance;+2 for safety/equipment   Toileting- Clothing Manipulation and Hygiene: Total assistance       Functional mobility during ADLs: Moderate assistance;+2 for physical assistance;+2 for safety/equipment(sit<Stand only this session)       Vision         Perception     Praxis      Pertinent Vitals/Pain Pain Assessment: Faces Faces Pain Scale: Hurts a little bit Pain  Location: abdomen Pain Descriptors / Indicators: Aching;Grimacing;Guarding Pain Intervention(s): Monitored during session;Repositioned      Hand Dominance Right   Extremity/Trunk Assessment Upper Extremity Assessment Upper Extremity Assessment: Generalized weakness   Lower Extremity Assessment Lower Extremity Assessment: Defer to PT evaluation   Cervical / Trunk Assessment Cervical / Trunk Assessment: Kyphotic   Communication Communication Communication: No difficulties   Cognition Arousal/Alertness: Awake/alert Behavior During Therapy: Restless;Impulsive Overall Cognitive Status: Impaired/Different from baseline Area of Impairment: Safety/judgement;Following commands;Problem solving;Attention;Orientation                 Orientation Level: Disoriented to;Situation;Place Current Attention Level: Focused   Following Commands: Follows one step commands with increased time Safety/Judgement: Decreased awareness of safety;Decreased awareness of deficits   Problem Solving: Slow processing;Decreased initiation;Difficulty sequencing;Requires verbal cues;Requires tactile cues General Comments: Some impulsivity noted.   General Comments       Exercises     Shoulder Instructions      Home Living Family/patient expects to be discharged to:: Private residence Living Arrangements: Alone Available Help at Discharge: Family;Available 24 hours/day(sister ) Type of Home: House Home Access: Stairs to enter [employer] of Steps: 5 Entrance Stairs-Rails: Right Home Layout: One level     Bathroom Shower/Tub: Chief Strategy Officer: Standard     Home Equipment: None   Additional Comments: lives with her dog and cat      Prior Functioning/Environment Level of Independence: Independent        Comments: enjoys riding horses        OT Problem List: Decreased activity tolerance;Impaired balance (sitting and/or standing);Pain;Decreased knowledge of use of DME or AE;Decreased cognition;Decreased safety awareness      OT Treatment/Interventions: Self-care/ADL training;DME and/or  AE instruction;Cognitive remediation/compensation;Patient/family education;Balance training    OT Goals(Current goals can be found in the care plan section) Acute Rehab OT Goals Patient Stated Goal: to return home to her pets, pain relief OT Goal Formulation: With patient Time For Goal Achievement: 06/05/18 Potential to Achieve Goals: Good  OT Frequency: Min 2X/week   Barriers to D/C:            Co-evaluation PT/OT/SLP Co-Evaluation/Treatment: Yes Reason for Co-Treatment: For patient/therapist safety;To address functional/ADL transfers;Necessary to address cognition/behavior during functional activity;Complexity of the patient's impairments (multi-system involvement) PT goals addressed during session: Mobility/safety with mobility;Balance;Strengthening/ROM OT goals addressed during session: ADL's and self-care;Strengthening/ROM      AM-PAC OT "6 Clicks" Daily Activity     Outcome Measure Help from another person eating meals?: Total(NPO) Help from another person taking care of personal grooming?: A Lot Help from another person toileting, which includes using toliet, bedpan, or urinal?: Total Help from another person bathing (including washing, rinsing, drying)?: A Lot Help from another person to put on and taking off regular upper body clothing?: A Lot Help from another person to put on and taking off regular lower body clothing?: A Lot 6 Click Score: 10   End of Session Equipment Utilized During Treatment: Oxygen Nurse Communication: Mobility status  Activity Tolerance: Patient limited by pain Patient left: in bed;with call bell/phone within reach;with nursing/sitter in room;with family/visitor present;with restraints reapplied  OT Visit Diagnosis: Unsteadiness on feet (R26.81);Other abnormalities of gait and mobility (R26.89);Pain;Other symptoms and signs involving cognitive function Pain - part of body: (abdomen)                Time: 2956-2130 OT Time Calculation (min): 19  min Charges:  OT General Charges $OT Visit: 1 Visit OT Evaluation $  OT Re-eval: 1 Re-eval  Sherryl Manges OTR/L Acute Rehabilitation Services Pager: [phone] Office: [phone]  Lova Maile Shelvy Perazzo 05/22/2018, 1:43 PM

## 2018-05-22 NOTE — Progress Notes (Signed)
[facility]  Subjective  - No acute events.  Remains confused.   Objective 125/71 (!) 48 98.2 F (36.8 C) (Oral) 17 96%  Intake/Output Summary (Last 24 hours) at 05/22/2018 0757 Last data filed at 05/22/2018 0700 Gross per 24 hour  Intake 3183.64 ml  Output 2275 ml  Net 908.64 ml    ABd: soft, ND, NT, midline vac with good seal Colostomy with watery output Bilateral groin incisions healing without issue Palpable pedal pulses bilaterally   Laboratory Lab Results: Recent Labs    05/19/18 1352 05/22/18 0414  WBC 11.9* 13.1*  HGB 9.2* 8.5*  HCT 27.4* 27.1*  PLT 106* 197   BMET Recent Labs    05/21/18 0449 05/22/18 0413  NA 141 145  K 3.6 4.0  CL 114* 118*  CO2 18* 19*  GLUCOSE 170* 191*  BUN 61* 87*  CREATININE 3.18* 4.38*  CALCIUM 7.7* 8.1*    COAG Lab Results  Component Value Date   INR 1.4 (H) 05/12/2018   INR 1.5 (H) 04/18/2018   INR 1.1 05-25-2018   No results found for: PTT  Assessment/Planning: 63 y.o. female status post aortobifemoral bypass postop day 12.  Atheroemboli complication with infarction of small/large bowel and renal failure.  N: Pleasantly confused, no deficits. CV: Hemodynamically stable GI: Tolerating enteral tube feeds, colostomy productive, abdomen soft, imodium started over weekend for higher output from colostomy Renal: likely HD today FEN: K ok, no other lyte issues ID: afebrile, no active infectious concerns  Awaiting floor bed - stable in ICU.  Cephus Shelling 05/22/2018 7:57 AM --

## 2018-05-22 NOTE — Consult Note (Signed)
WOC Nurse wound follow up Wound type: surgical  Measurement:17cm x 4cm x 3.5xm  Wound AUQ:JFHLKT clean, subcutaneous tissue, visible sutures at the base proximally. Some yellow tissue noted distal aspect of the wound Drainage (amount, consistency, odor) minimal in the VAC canister Periwound: Intact  Dressing procedure/placement/frequency: Removed old NPWT dressing CCS Michael at the bedside for assessment and photo Filled wound with 1 piece of black foam. Sealed NPWT dressing at HG Patient received IV medication per bedside nurse prior to dressing change Patient tolerated procedure well WOC nurse will change due to proximity of stoma to open wound.   WOC Nurse ostomy follow up Stoma type/location: LUQ, end transverse colostomy  Stomal assessment/size: 1 1/4" x 2" oval shaped, budded, but os is noted at 6 oclock  Peristomal assessment: intact  Treatment options for stomal/peristomal skin: using 2" barrier ring to aid in seal and create slight convexity  Output liquid yellow Ostomy pouching: 2pc. 2 3/4" skin barrier was leaking when I arrived and had leaked prior to my arrival. Changed skin barrier  Education provided: NA, patient is pleasantly confused and no family in the room.  Enrolled patient in [place] [employer] Discharge program: No  Supplies ordered by Licensed conveyancer for ostomy. Will need NPWT dressing ordered for Wednesday.    Thanks  Teriyah Purington [employer], RN,CWOCN, CNS, CWON-AP [phone])

## 2018-05-22 NOTE — Progress Notes (Signed)
Pt received from 2H. Pt is alert to self. VSS. Telemetry applied, CCMD notified x2.   Leonidas Romberg, RN

## 2018-05-23 LAB — RENAL FUNCTION PANEL
Albumin: 1.9 g/dL — ABNORMAL LOW (ref 3.5–5.0)
Anion gap: 12 (ref 5–15)
BUN: 115 mg/dL — ABNORMAL HIGH (ref 8–23)
CHLORIDE: 122 mmol/L — AB (ref 98–111)
CO2: 15 mmol/L — ABNORMAL LOW (ref 22–32)
Calcium: 8.6 mg/dL — ABNORMAL LOW (ref 8.9–10.3)
Creatinine, Ser: 5.38 mg/dL — ABNORMAL HIGH (ref 0.44–1.00)
GFR calc Af Amer: 9 mL/min — ABNORMAL LOW (ref >60)
GFR calc non Af Amer: 8 mL/min — ABNORMAL LOW (ref >60)
Glucose, Bld: 155 mg/dL — ABNORMAL HIGH (ref 70–99)
Phosphorus: 7.9 mg/dL — ABNORMAL HIGH (ref 2.5–4.6)
Potassium: 4.7 mmol/L (ref 3.5–5.1)
Sodium: 149 mmol/L — ABNORMAL HIGH (ref 135–145)

## 2018-05-23 LAB — GLUCOSE, CAPILLARY
GLUCOSE-CAPILLARY: 150 mg/dL — AB (ref 70–99)
Glucose-Capillary: 147 mg/dL — ABNORMAL HIGH (ref 70–99)
Glucose-Capillary: 146 mg/dL — ABNORMAL HIGH (ref 70–99)
Glucose-Capillary: 137 mg/dL — ABNORMAL HIGH (ref 70–99)
Glucose-Capillary: 145 mg/dL — ABNORMAL HIGH (ref 70–99)
Glucose-Capillary: 119 mg/dL — ABNORMAL HIGH (ref 70–99)

## 2018-05-23 LAB — CBC
HCT: 27.3 % — ABNORMAL LOW (ref 36.0–46.0)
Hemoglobin: 8.8 g/dL — ABNORMAL LOW (ref 12.0–15.0)
MCH: 31.4 pg (ref 26.0–34.0)
MCHC: 32.2 g/dL (ref 30.0–36.0)
MCV: 97.5 fL (ref 80.0–100.0)
Platelets: 268 10*3/uL (ref 150–400)
RBC: 2.8 MIL/uL — ABNORMAL LOW (ref 3.87–5.11)
RDW: 16.1 % — ABNORMAL HIGH (ref 11.5–15.5)
WBC: 15.1 10*3/uL — ABNORMAL HIGH (ref 4.0–10.5)
nRBC: 0 % (ref 0.0–0.2)

## 2018-05-23 MED ORDER — DIPHENOXYLATE-ATROPINE 2.5-0.025 MG/5ML PO LIQD
5.0000 mL | Freq: Four times a day (QID) | ORAL | Status: DC | PRN
  Administered 2018-05-25: 5 mL
  Filled 2018-05-23: qty 5

## 2018-05-23 MED ORDER — HEPARIN SODIUM (PORCINE) 1000 UNIT/ML IJ SOLN
INTRAMUSCULAR | Status: AC
  Administered 2018-05-23: 1000 [IU] via INTRAVENOUS_CENTRAL
  Filled 2018-05-23: qty 2

## 2018-05-23 MED ORDER — FENTANYL CITRATE (PF) 100 MCG/2ML IJ SOLN
INTRAMUSCULAR | Status: AC
  Filled 2018-05-23: qty 2

## 2018-05-23 MED ORDER — SODIUM CHLORIDE 0.9 % IV SOLN: INTRAVENOUS | Status: DC | PRN

## 2018-05-23 MED ORDER — SODIUM CHLORIDE 0.9 % IV SOLN
INTRAVENOUS | Status: DC | PRN
  Administered 2018-05-27 – 2018-05-29 (×3): via INTRAVENOUS

## 2018-05-23 NOTE — Progress Notes (Addendum)
  Progress Note    05/23/2018 7:28 AM 10 Days Post-Op  Subjective:  Says she hurts  Afebrile HR 60's-100's NSR 120's-140's systolic 95% RA  Vitals:   05/22/18 2359 05/23/18 0421  BP: (!) 146/97 (!) 145/68  Pulse: (!) 107 (!) 106  Resp: 16 (!) 24  Temp: 98.5 F (36.9 C) 98.2 F (36.8 C)  SpO2: 95%     Physical Exam: Cardiac:  regular Lungs:  Non labored  Incisions:  Midline with wound vac in place; bilateral groins look good. Extremities:  Palpable DP pulses bilaterally Abdomen:  Soft; ileostomy bag is leaking (RN working on it); +BS; RN reports flatus through ileostomy; abdomen soft  Neuro:  Not alert to place or year; alert to president.  CBC    Component Value Date/Time   WBC 13.1 (H) 05/22/2018 0414   RBC 2.82 (L) 05/22/2018 0414   HGB 8.5 (L) 05/22/2018 0414   HGB 15.8 01/23/2018 1218   HCT 27.1 (L) 05/22/2018 0414   HCT 45.9 01/23/2018 1218   PLT 197 05/22/2018 0414   PLT 253 01/23/2018 1218   MCV 96.1 05/22/2018 0414   MCV 93 01/23/2018 1218   MCH 30.1 05/22/2018 0414   MCHC 31.4 05/22/2018 0414   RDW 15.7 (H) 05/22/2018 0414   RDW 13.7 01/23/2018 1218   LYMPHSABS 1.1 05/08/2018 1802   LYMPHSABS 2.8 01/23/2018 1218   MONOABS 0.3 05/03/2018 1802   EOSABS 0.0 04/20/2018 1802   EOSABS 0.1 01/23/2018 1218   BASOSABS 0.0 04/27/2018 1802   BASOSABS 0.1 01/23/2018 1218    BMET    Component Value Date/Time   NA 145 05/22/2018 0413   NA 137 01/23/2018 1218   K 4.0 05/22/2018 0413   CL 118 (H) 05/22/2018 0413   CO2 19 (L) 05/22/2018 0413   GLUCOSE 191 (H) 05/22/2018 0413   BUN 87 (H) 05/22/2018 0413   BUN 11 01/23/2018 1218   CREATININE 4.38 (H) 05/22/2018 0413   CREATININE 0.66 06/18/2015 1855   CALCIUM 8.1 (L) 05/22/2018 0413   GFRNONAA 10 (L) 05/22/2018 0413   GFRNONAA >89 01/31/2014 1532   GFRAA 12 (L) 05/22/2018 0413   GFRAA >89 01/31/2014 1532    INR    Component Value Date/Time   INR 1.4 (H) 05/12/2018 0415     Intake/Output  Summary (Last 24 hours) at 05/23/2018 0728 Last data filed at 05/23/2018 0600 Gross per 24 hour  Intake 900 ml  Output 1675 ml  Net -775 ml     Assessment:  63 y.o. female is s/p:  status post aortobifemoral bypass postop day 12.  Atheroemboli complication with infarction of small/large bowel and renal failure.  10 Days Post-Op  Plan: Neuro:  Pleasantly confused CV:  Doing well in NSR; BP stable; palpable DP pulses bilaterally GI:  Tolerating enteral TF's; ileostomy working well; bag is leaking and RN is working on this.  +BS and abdomen is soft -renal:  Creatinine continues to rise-dialysis per renal service -increase in leukocytosis yesterday-continue to monitor.  Pt is afebrile  -DVT prophylaxis:  SCD's   Doreatha Massed, PA-C [facility] [phone] 05/23/2018 7:28 AM  I have examined the patient, reviewed and agree with above.  2+ dorsalis pedis pulses bilaterally.  Remains confused.  Continue hemodialysis.  Gretta Began, MD 05/23/2018 8:36 AM

## 2018-05-23 NOTE — Progress Notes (Signed)
Pt returned from HD with colostomy bag leaking. Per report from HD the colostomy bag was "just gas". Edge closest to the mid abdominal wound vac had come unsealed. Zharia RN and I changed colostomy bag and replaced seal over mid abdominal wound vac.  Leonidas Romberg, RN

## 2018-05-23 NOTE — Consult Note (Signed)
WOC Nurse ostomy follow up Stoma type/location: LUQ, transverse colstomy Stomal assessment/size: 1 1/4" x 2" oval, os at 6 o'clock  Peristomal assessment: minor denudation from 3-9 o'clock most likely from leakage  Treatment options for stomal/peristomal skin: skin barrier ring Output liquid yellow Ostomy pouching: 1pc.flex convex, added 2" barrier ring to entire stoma and 1/2 of barrier ring from 3-9 o'clock   Education provided: not appropriate at this time.  Will await definitive DC plans and appropriate pouching to be determined Enrolled patient in [facility] Discharge program:No  This is the second time patient has had leakage in 24 hours; once at 9 o'clock and once at 3 o'clock. We may need to add a belt, however today we placed the pouch side lying in order to facilitate the staff being able to empty the pouch.  Will continue to support patient for wound and ostomy care.  Jaret Coppedge [facility], CNS, [employer] [phone]

## 2018-05-23 NOTE — Progress Notes (Signed)
[facility] ROUNDING NOTE   Subjective:   Brief history this 63 year old lady with a history of aortofemoral bypass, hypotension respiratory failure necrotic bowel requiring exploratory laparotomy, intubated anuric kidney injury requiring CRRT.  She has now been converted to intermittent hemodialysis, will schedule dialysis for 05/23/2018.  Blood pressure 151/72 pulse 58 temperature 98.2 O2 sats 95% room air  No labs this morning  Chest x-ray shows an endotracheal tube in place with bilateral scarring and atelectasis.  Atorvastatin 80 mg daily insulin sliding scale Protonix 40 mg IV daily, Lopressor 5 mg every 8 hours IV, loperamide 4 mg 3 times daily   Objective:  Vital signs in last 24 hours:  Temp:  [97.9 F (36.6 C)-99 F (37.2 C)] 98.2 F (36.8 C) (03/10 0421) Pulse Rate:  [34-108] 106 (03/10 0421) Resp:  [15-24] 17 (03/10 0756) BP: (140-176)/(65-101) 151/72 (03/10 0756) SpO2:  [75 %-100 %] 95 % (03/09 2359) Weight:  [61.7 kg] 61.7 kg (03/10 0500)  Weight change: -1.361 kg Filed Weights   05/21/18 0540 05/22/18 0415 05/23/18 0500  Weight: 63.5 kg 63 kg 61.7 kg    Intake/Output: I/O last 3 completed shifts: In: 2886.1 [I.V.:646.1; NG/GT:2240] Out: 2750 [Drains:25; Stool:2725]   Intake/Output this shift:  No intake/output data recorded.  CVS- RRR regular rate and rhythm RS- CTA no wheezes or rales ABD- BS present soft non-distended ex lap incision site clean dry dressing colostomy EXT-temporary dialysis catheter left IJ   Basic Metabolic Panel: Recent Labs  Lab 05/16/18 2030  05/18/18 0356  05/19/18 0446 05/19/18 0641  05/19/18 2220 05/20/18 0504 05/20/18 1611 05/21/18 0449 05/22/18 0413  NA  --    < > 137   < > 135  --    < > 138 137 140 141 145  K  --    < > 3.7   < > 6.1*  --    < > 3.1* 3.1* 3.5 3.6 4.0  CL  --    < > 101   < > 105  --    < > 105 107 111 114* 118*  CO2  --    < > 23   < > 19*  --    < > 25 24 22  18* 19*  GLUCOSE   --    < > 139*   < > 226*  --    < > 148* 165* 148* 170* 191*  BUN  --    < > 15   < > 43*  --    < > 22 33* 48* 61* 87*  CREATININE  --    < > 1.22*   < > 2.50*  --    < > 1.64* 2.10* 2.73* 3.18* 4.38*  CALCIUM  --    < > 8.0*   < > 7.6*  --    < > 7.8* 7.7* 7.9* 7.7* 8.1*  MG 2.5*  --  2.3  --  2.2 2.2  --   --   --   --   --   --   PHOS  --    < > 1.5*   < > 8.1*  --    < > 1.7* 2.2* 2.8 3.5 5.3*   < > = values in this interval not displayed.    Liver Function Tests: Recent Labs  Lab 05/18/18 0356  05/19/18 2220 05/20/18 0504 05/20/18 1611 05/21/18 0449 05/22/18 0413  AST 222*  --   --   --   --   --   --  ALT 172*  --   --   --   --   --   --   ALKPHOS 154*  --   --   --   --   --   --   BILITOT 2.0*  --   --   --   --   --   --   PROT 5.5*  --   --   --   --   --   --   ALBUMIN 1.6*  1.6*   < > 1.7* 1.7* 1.7* 1.6* 1.7*   < > = values in this interval not displayed.   No results for input(s): LIPASE, AMYLASE in the last 168 hours. No results for input(s): AMMONIA in the last 168 hours.  CBC: Recent Labs  Lab 05/17/18 0754 05/18/18 0356 05/19/18 0446 05/19/18 1352 05/22/18 0414  WBC 12.4* 10.7* 11.9* 11.9* 13.1*  HGB 9.6* 9.8* 9.2* 9.2* 8.5*  HCT 28.5* 28.4* 27.7* 27.4* 27.1*  MCV 90.8 91.6 92.6 91.9 96.1  PLT 85* 82* 90* 106* 197    Cardiac Enzymes: No results for input(s): CKTOTAL, CKMB, CKMBINDEX, TROPONINI in the last 168 hours.  BNP: Invalid input(s): POCBNP  CBG: Recent Labs  Lab 05/22/18 1608 05/22/18 1933 05/23/18 0001 05/23/18 0423 05/23/18 0749  GLUCAP 138* 134* 150* 147* 146*    Microbiology: Results for orders placed or performed during the hospital encounter of 04/22/2018  Culture, blood (Routine X 2) w Reflex to ID Panel     Status: None   Collection Time: 05/12/18  3:00 AM  Result Value Ref Range Status   Specimen Description BLOOD LEFT ARM  Final   Special Requests   Final    BOTTLES DRAWN AEROBIC AND ANAEROBIC Blood Culture  adequate volume   Culture   Final    NO GROWTH 5 DAYS Performed at [facility], [address] [place], [place] [address]    Report Status 05/17/2018 FINAL  Final  Culture, blood (Routine X 2) w Reflex to ID Panel     Status: None   Collection Time: 05/12/18  3:20 AM  Result Value Ref Range Status   Specimen Description BLOOD LEFT HAND  Final   Special Requests   Final    BOTTLES DRAWN AEROBIC ONLY Blood Culture adequate volume   Culture   Final    NO GROWTH 5 DAYS Performed at [facility], [address], [place], [place] [address]    Report Status 05/17/2018 FINAL  Final    Coagulation Studies: No results for input(s): LABPROT, INR in the last 72 hours.  Urinalysis: No results for input(s): COLORURINE, LABSPEC, PHURINE, GLUCOSEU, HGBUR, BILIRUBINUR, KETONESUR, PROTEINUR, UROBILINOGEN, NITRITE, LEUKOCYTESUR in the last 72 hours.  Invalid input(s): APPERANCEUR    Imaging: No results found.   Medications:   . sodium chloride 10 mL/hr at 05/14/18 0700  . sodium chloride    . sodium chloride    . feeding supplement (VITAL AF 1.2 CAL) 1,000 mL (05/23/18 0752)   . atorvastatin  80 mg Per Tube Daily  . Chlorhexidine Gluconate Cloth  6 each Topical Daily  . Chlorhexidine Gluconate Cloth  6 each Topical Q0600  . feeding supplement (PRO-STAT SUGAR FREE 64)  30 mL Per Tube TID  . Gerhardt's butt cream   Topical BID  . insulin aspart  0-9 Units Subcutaneous Q4H  . loperamide HCl  4 mg Per Tube TID  . mouth rinse  15 mL Mouth Rinse BID  .  metoprolol tartrate  5 mg Intravenous Q8H  . pantoprazole (PROTONIX) IV  40 mg Intravenous Daily  . sodium chloride flush  10-40 mL Intracatheter Q12H   sodium chloride, sodium chloride, sodium chloride, acetaminophen **OR** acetaminophen, alteplase, bisacodyl, fentaNYL (SUBLIMAZE) injection, heparin, labetalol, lidocaine (PF), lidocaine-prilocaine, ondansetron, pentafluoroprop-tetrafluoroeth, phenol, sodium chloride  flush  Assessment/ Plan:   Oliguric acute kidney injury presumably secondary to acute tubular necrosis from hypotension and possibly sepsis necrotic bowel status post exploratory laparotomy with colostomy placement.  She was on CRRT from 2/29/2022 to 05/18/2018 with a temporary dialysis catheter placed in the left IJ.  Dialysis is planned for 05/23/2018.  Continue to avoid nephrotoxin agents and also IV contrast.  Does not appear to be return of renal function at this point.  Continue to follow urine output and renal panel on a daily basis  Acute respiratory failure mechanical ventilation followed by pulmonology and critical care patient now extubated  History of septic shock due to ischemic and necrotic bowel now off pressors  Peripheral artery disease status post aorto bifemoral bypass 2018/06/02  Ischemic bowel status post small bowel resection and right and left colectomy with colostomy  History of thrombocytopenia no heparin with dialysis    LOS: 13 Garnetta Buddy @TODAY @8 :53 AM

## 2018-05-23 NOTE — Progress Notes (Addendum)
10 Days Post-Op  Subjective: CC: Pain Patient reports that she has pain. Cannot localize this pain. No emesis. Confused, only orientated to self and president, not to place or time   Objective: Vital signs in last 24 hours: Temp:  [97.9 F (36.6 C)-99 F (37.2 C)] 98.2 F (36.8 C) (03/10 0421) Pulse Rate:  [54-108] 106 (03/10 0421) Resp:  [15-24] 17 (03/10 0756) BP: (140-176)/(65-101) 151/72 (03/10 0756) SpO2:  [93 %-100 %] 95 % (03/09 2359) Weight:  [61.7 kg] 61.7 kg (03/10 0500) Last BM Date: 05/23/18  Intake/Output from previous day: 03/09 0701 - 03/10 0700 In: 900 [I.V.:50; NG/GT:850] Out: 1675 [Drains:25; Stool:1650] Intake/Output this shift: No intake/output data recorded.  PE: Gen: frail, ill appearing, NAD, awake and alert Card: RRR Lungs: Normal effort Abd: Soft, non-distended, +BS, stoma budded and viable with os at 6 o'clock. Present gas and large amount of light yellow/brown stool in bag. There is leakage around the bag. Will need changing and new skin barrier. Midline wound vac in place. Tenderness around midline wound, otherwise NTTP. Msk: no edema, DP intake b/l Skin: warm and dry  Picture from wound vac change on 3/9     Lab Results:  Recent Labs    05/22/18 0414  WBC 13.1*  HGB 8.5*  HCT 27.1*  PLT 197   BMET Recent Labs    05/21/18 0449 05/22/18 0413  NA 141 145  K 3.6 4.0  CL 114* 118*  CO2 18* 19*  GLUCOSE 170* 191*  BUN 61* 87*  CREATININE 3.18* 4.38*  CALCIUM 7.7* 8.1*   PT/INR No results for input(s): LABPROT, INR in the last 72 hours. CMP     Component Value Date/Time   NA 145 05/22/2018 0413   NA 137 01/23/2018 1218   K 4.0 05/22/2018 0413   CL 118 (H) 05/22/2018 0413   CO2 19 (L) 05/22/2018 0413   GLUCOSE 191 (H) 05/22/2018 0413   BUN 87 (H) 05/22/2018 0413   BUN 11 01/23/2018 1218   CREATININE 4.38 (H) 05/22/2018 0413   CREATININE 0.66 06/18/2015 1855   CALCIUM 8.1 (L) 05/22/2018 0413   PROT 5.5 (L)  05/18/2018 0356   PROT 7.2 01/23/2018 1218   ALBUMIN 1.7 (L) 05/22/2018 0413   ALBUMIN 4.3 01/23/2018 1218   AST 222 (H) 05/18/2018 0356   ALT 172 (H) 05/18/2018 0356   ALKPHOS 154 (H) 05/18/2018 0356   BILITOT 2.0 (H) 05/18/2018 0356   BILITOT 0.2 01/23/2018 1218   GFRNONAA 10 (L) 05/22/2018 0413   GFRNONAA >89 01/31/2014 1532   GFRAA 12 (L) 05/22/2018 0413   GFRAA >89 01/31/2014 1532   Lipase  No results found for: LIPASE     Studies/Results: No results found.  Anti-infectives: Anti-infectives (From admission, onward)   Start     Dose/Rate Route Frequency Ordered Stop   05/19/18 0900  meropenem (MERREM) 500 mg in sodium chloride 0.9 % 100 mL IVPB     500 mg 200 mL/hr over 30 Minutes Intravenous  Once 05/18/18 0956 05/19/18 0825   05/17/18 1130  vancomycin (VANCOCIN) IVPB 750 mg/150 ml premix  Status:  Discontinued     750 mg 150 mL/hr over 60 Minutes Intravenous Every 24 hours 05/17/18 0733 05/17/18 0919   05/16/18 0900  vancomycin (VANCOCIN) IVPB 750 mg/150 ml premix  Status:  Discontinued     750 mg 150 mL/hr over 60 Minutes Intravenous Every 24 hours 05/16/18 0745 05/17/18 0733   05/15/18 0800  vancomycin (VANCOCIN)  IVPB 750 mg/150 ml premix  Status:  Discontinued     750 mg 150 mL/hr over 60 Minutes Intravenous Every 24 hours 05/14/18 0916 05/16/18 0745   05/02/2018 2200  meropenem (MERREM) 1 g in sodium chloride 0.9 % 100 mL IVPB  Status:  Discontinued     1 g 200 mL/hr over 30 Minutes Intravenous Every 12 hours 04/20/2018 1714 05/18/18 0956   05/12/18 1115  vancomycin (VANCOCIN) 1,250 mg in sodium chloride 0.9 % 250 mL IVPB     1,250 mg 166.7 mL/hr over 90 Minutes Intravenous  Once 05/12/18 1109 05/12/18 1348   05/12/18 1115  meropenem (MERREM) 500 mg in sodium chloride 0.9 % 100 mL IVPB  Status:  Discontinued     500 mg 200 mL/hr over 30 Minutes Intravenous Every 12 hours 05/12/18 1109 05/05/2018 1714   05/12/18 1109  vancomycin variable dose per unstable renal  function (pharmacist dosing)  Status:  Discontinued      Does not apply See admin instructions 05/12/18 1109 05/14/18 0900   05-22-18 1800  vancomycin (VANCOCIN) IVPB 1000 mg/200 mL premix     1,000 mg 200 mL/hr over 60 Minutes Intravenous Every 12 hours May 22, 2018 1357 04/15/2018 1759   05/22/2018 0540  vancomycin (VANCOCIN) IVPB 1000 mg/200 mL premix     1,000 mg 200 mL/hr over 60 Minutes Intravenous 60 min pre-op 22-May-2018 0540 05/22/2018 1504       Assessment/Plan  AIOD with aortic occlusion - S/PAortobifemoral bypass wth 14 x 8 Hemashield graft, 22-May-2018, Dr. Arbie Cookey  Ischemic distal small bowel, right and left colon ischemia: - S/Pex lap with right and left colon resection, small bowel resection 2/27 Magnus Ivan)- S/Pileo-transverse colon anastomosis and left end colostomy 2/29 Janee Morn) - midline with wound vac in place - high output from colostomy. Down from 2,275 (3/9) to 1650 (3/10). Imodiumincreased from BID to TID on 3/9. Will add Lomotil. Goal 1L/24 hours. Electrolytes and fluid management per nephrology, she is supposed to have hemodialysis today per their note  FEN:TF's@ goal, 9ml/hr;  VTE: SCD's,heparin for hemodialysis  PY:[id number] 03/04, Merremonce 03/06,WBCup to 13.1 (3/9), monitor, afebrile Follow up:TBD    LOS: 13 days    Jacinto Halim , [facility] 05/23/2018, 9:03 AM Pager: [phone]

## 2018-05-24 ENCOUNTER — Inpatient Hospital Stay (HOSPITAL_COMMUNITY): Payer: Medicare Other

## 2018-05-24 ENCOUNTER — Other Ambulatory Visit: Payer: Self-pay

## 2018-05-24 DIAGNOSIS — D62 Acute posthemorrhagic anemia: Secondary | ICD-10-CM

## 2018-05-24 DIAGNOSIS — D72829 Elevated white blood cell count, unspecified: Secondary | ICD-10-CM

## 2018-05-24 DIAGNOSIS — Z720 Tobacco use: Secondary | ICD-10-CM

## 2018-05-24 DIAGNOSIS — J449 Chronic obstructive pulmonary disease, unspecified: Secondary | ICD-10-CM

## 2018-05-24 DIAGNOSIS — N179 Acute kidney failure, unspecified: Secondary | ICD-10-CM

## 2018-05-24 DIAGNOSIS — I10 Essential (primary) hypertension: Secondary | ICD-10-CM

## 2018-05-24 DIAGNOSIS — E119 Type 2 diabetes mellitus without complications: Secondary | ICD-10-CM

## 2018-05-24 LAB — CBC
HCT: 27.8 % — ABNORMAL LOW (ref 36.0–46.0)
Hemoglobin: 9 g/dL — ABNORMAL LOW (ref 12.0–15.0)
MCH: 30.2 pg (ref 26.0–34.0)
MCHC: 32.4 g/dL (ref 30.0–36.0)
MCV: 93.3 fL (ref 80.0–100.0)
PLATELETS: 245 10*3/uL (ref 150–400)
RBC: 2.98 MIL/uL — AB (ref 3.87–5.11)
RDW: 15.2 % (ref 11.5–15.5)
WBC: 16.2 10*3/uL — ABNORMAL HIGH (ref 4.0–10.5)
nRBC: 0 % (ref 0.0–0.2)

## 2018-05-24 LAB — BASIC METABOLIC PANEL
Anion gap: 13 (ref 5–15)
BUN: 49 mg/dL — ABNORMAL HIGH (ref 8–23)
CO2: 25 mmol/L (ref 22–32)
Calcium: 8.4 mg/dL — ABNORMAL LOW (ref 8.9–10.3)
Chloride: 103 mmol/L (ref 98–111)
Creatinine, Ser: 3.29 mg/dL — ABNORMAL HIGH (ref 0.44–1.00)
GFR calc Af Amer: 16 mL/min — ABNORMAL LOW (ref >60)
GFR calc non Af Amer: 14 mL/min — ABNORMAL LOW (ref >60)
Glucose, Bld: 169 mg/dL — ABNORMAL HIGH (ref 70–99)
POTASSIUM: 3.7 mmol/L (ref 3.5–5.1)
Sodium: 141 mmol/L (ref 135–145)

## 2018-05-24 LAB — GLUCOSE, CAPILLARY
GLUCOSE-CAPILLARY: 133 mg/dL — AB (ref 70–99)
GLUCOSE-CAPILLARY: 164 mg/dL — AB (ref 70–99)
Glucose-Capillary: 129 mg/dL — ABNORMAL HIGH (ref 70–99)
Glucose-Capillary: 147 mg/dL — ABNORMAL HIGH (ref 70–99)
Glucose-Capillary: 134 mg/dL — ABNORMAL HIGH (ref 70–99)
Glucose-Capillary: 150 mg/dL — ABNORMAL HIGH (ref 70–99)

## 2018-05-24 LAB — MAGNESIUM: Magnesium: 1.7 mg/dL (ref 1.7–2.4)

## 2018-05-24 MED ORDER — PANTOPRAZOLE SODIUM 40 MG PO TBEC
40.0000 mg | DELAYED_RELEASE_TABLET | Freq: Every day | ORAL | Status: DC
  Administered 2018-05-24 – 2018-05-29 (×6): 40 mg via ORAL
  Filled 2018-05-24 (×6): qty 1

## 2018-05-24 MED ORDER — CHLORHEXIDINE GLUCONATE CLOTH 2 % EX PADS: 6.0000 | MEDICATED_PAD | Freq: Every day | CUTANEOUS | Status: DC

## 2018-05-24 NOTE — Progress Notes (Addendum)
[facility]  Subjective  - Alert and moving all extremities.   Objective 109/83 86 98.7 F (37.1 C) (Oral) (!) 22 97%  Intake/Output Summary (Last 24 hours) at 05/24/2018 0802 Last data filed at 05/24/2018 0439 Gross per 24 hour  Intake 750 ml  Output 1550 ml  Net -800 ml    Palpable DP B, groins soft without hematoma Central abdominal wound vac being replaced this am, having difficulties with colostomy bag leaking. NG tube in place, + BS, abdomin soft. Lungs non labored breathing Heart RRR  Assessment/Planning: POD # 13 status post aortobifemoral bypass postop day POD# 11 Atheroemboli complication with infarction of small/large bowel and renal failure.   NG tube in place 750 out put + BS Wound vac changes this am by RN, wound appears healthy. Good perfusion B LE with palpable pulses Will order new labs for tomorrow, pending today's labs Patient remains confused  Mosetta Pigeon 05/24/2018 8:02 AM --  Laboratory Lab Results: Recent Labs    05/22/18 0414 05/23/18 1439  WBC 13.1* 15.1*  HGB 8.5* 8.8*  HCT 27.1* 27.3*  PLT 197 268   BMET Recent Labs    05/22/18 0413 05/23/18 1439  NA 145 149*  K 4.0 4.7  CL 118* 122*  CO2 19* 15*  GLUCOSE 191* 155*  BUN 87* 115*  CREATININE 4.38* 5.38*  CALCIUM 8.1* 8.6*    COAG Lab Results  Component Value Date   INR 1.4 (H) 05/12/2018   INR 1.5 (H) 04/19/2018   INR 1.1 May 28, 2018   No results found for: PTT  I have examined the patient, reviewed and agree with above.  Gretta Began, MD 05/24/2018 4:25 PM

## 2018-05-24 NOTE — Progress Notes (Addendum)
Nutrition Follow Up  DOCUMENTATION CODES:   Not applicable  INTERVENTION:    Vital AF 1.2 at goal rate of 50 ml/hr with Prostat 30 ml TID  Provides 1740 kcals, 135 gm protein, 973 ml free water daily  NUTRITION DIAGNOSIS:   Inadequate oral intake now related to dysphagia as evidenced by NPO status, ongoing  GOAL:   Patient will meet greater than or equal to 90% of their needs, met   MONITOR:   TF tolerance, Diet advancement, Labs, Skin, Weight trends, I & O's  ASSESSMENT:   Pt with PMH T2DM, COPD, HTN, HLD and depression. Presented to [facility] for aortobifemoral bypass procedure on 2/26. Found to have extensive necrotic bowel which was all resected 2/27.    2/26 s/p aortobifemoral bypass for aortoiliac occlusive disease w/aortic occlusion 2/27 intubated for respiratory insufficiency 2/27 s/p exp lap; found ischemic bowel/colon 2/29 s/p ileocolonic anastomosis, abd closure & colostomy  3/02 started trickle feeds via OGT 3/04 Cortrak placed; tip in stomach 3/05 extubated  Pt confused, restless in bed during RD visit. Speech Path continuing to follow; rec continued NPO status. Pt is at high aspiration risk d/t prolonged intubation and confusion.  Vital AF 1.2 infusing at goal rate of 50 ml/hr via Cortrak tube. Oliguric AKI; receiving intermittent HD. Labs & medications reviewed.  Weight has been stable. CBG's [id number]. Plan for MBSS.  Diet Order:   Diet Order            Diet NPO time specified  Diet effective now             EDUCATION NEEDS:   Not appropriate for education at this time  Skin: midline wound VAC  Last BM:  3/6   Intake/Output Summary (Last 24 hours) at 05/24/2018 1512 Last data filed at 05/24/2018 1111 Gross per 24 hour  Intake 10 ml  Output 1300 ml  Net -1290 ml   Height:   Ht Readings from Last 1 Encounters:  05/05/2018 5' 4" (1.626 m)   Weight:   Wt Readings from Last 1 Encounters:  05/24/18 62.1 kg  04/16/2018          62  kg  Ideal Body Weight:  56.8 kg  BMI:  Body mass index is 23.52 kg/m.  Estimated Nutritional Needs:   Kcal:  1700-1900  Protein:  125-140 gm  Fluid:  per MD  Arthur Holms, RD, LDN Pager #: [phone] After-Hours Pager #: [phone]

## 2018-05-24 NOTE — Progress Notes (Signed)
Family inquire about how to get Power of [place]. CSW informed the family they will need to hire a  lawyer to assist with POA.   Antony Blackbird, MSW, [facility] Clinical Social Worker [phone]

## 2018-05-24 NOTE — Care Management Important Message (Signed)
Important Message  Patient Details  Name: Gwendolyn Pham MRN: [id number] Date of Birth: 21-Aug-1955   Medicare Important Message Given:  Yes    [name] P Jeven Topper 05/24/2018, 4:55 PM

## 2018-05-24 NOTE — Progress Notes (Signed)
11 Days Post-Op  Subjective: CC:  No complaints this morning. Nurse reports leaking around colostomy overnight.   Objective: Vital signs in last 24 hours: Temp:  [97 F (36.1 C)-98.7 F (37.1 C)] 98.7 F (37.1 C) (03/11 0359) Pulse Rate:  [86-116] 86 (03/11 0359) Resp:  [17-30] 22 (03/11 0359) BP: (94-179)/(30-116) 109/83 (03/11 0359) SpO2:  [96 %-97 %] 97 % (03/11 0359) Weight:  [62.1 kg-63 kg] 62.1 kg (03/11 0444) Last BM Date: 05/23/18  Intake/Output from previous day: 03/10 0701 - 03/11 0700 In: 750 [NG/GT:750] Out: 1550 [Stool:1050] Intake/Output this shift: No intake/output data recorded.  PE: Gen: frail, ill appearing, NAD, awake and alert Card: RRR Lungs: Normal effort Abd: Soft, non-distended, +BS, stoma budded and viable with os at 6 o'clock. Present gas and large amount of light yellow/brown stool in bag. The colostomy bag was just changed per nurse. No current leakage. Midline wound vac in place. Minimal tenderness around vac. No peritonitis.  Msk: no edema, DP intake b/l Skin: warm and dry  Lab Results:  Recent Labs    05/22/18 0414 05/23/18 1439  WBC 13.1* 15.1*  HGB 8.5* 8.8*  HCT 27.1* 27.3*  PLT 197 268   BMET Recent Labs    05/22/18 0413 05/23/18 1439  NA 145 149*  K 4.0 4.7  CL 118* 122*  CO2 19* 15*  GLUCOSE 191* 155*  BUN 87* 115*  CREATININE 4.38* 5.38*  CALCIUM 8.1* 8.6*   PT/INR No results for input(s): LABPROT, INR in the last 72 hours. CMP     Component Value Date/Time   NA 149 (H) 05/23/2018 1439   NA 137 01/23/2018 1218   K 4.7 05/23/2018 1439   CL 122 (H) 05/23/2018 1439   CO2 15 (L) 05/23/2018 1439   GLUCOSE 155 (H) 05/23/2018 1439   BUN 115 (H) 05/23/2018 1439   BUN 11 01/23/2018 1218   CREATININE 5.38 (H) 05/23/2018 1439   CREATININE 0.66 06/18/2015 1855   CALCIUM 8.6 (L) 05/23/2018 1439   PROT 5.5 (L) 05/18/2018 0356   PROT 7.2 01/23/2018 1218   ALBUMIN 1.9 (L) 05/23/2018 1439   ALBUMIN 4.3 01/23/2018  1218   AST 222 (H) 05/18/2018 0356   ALT 172 (H) 05/18/2018 0356   ALKPHOS 154 (H) 05/18/2018 0356   BILITOT 2.0 (H) 05/18/2018 0356   BILITOT 0.2 01/23/2018 1218   GFRNONAA 8 (L) 05/23/2018 1439   GFRNONAA >89 01/31/2014 1532   GFRAA 9 (L) 05/23/2018 1439   GFRAA >89 01/31/2014 1532   Lipase  No results found for: LIPASE     Studies/Results: No results found.  Anti-infectives: Anti-infectives (From admission, onward)   Start     Dose/Rate Route Frequency Ordered Stop   05/19/18 0900  meropenem (MERREM) 500 mg in sodium chloride 0.9 % 100 mL IVPB     500 mg 200 mL/hr over 30 Minutes Intravenous  Once 05/18/18 0956 05/19/18 0825   05/17/18 1130  vancomycin (VANCOCIN) IVPB 750 mg/150 ml premix  Status:  Discontinued     750 mg 150 mL/hr over 60 Minutes Intravenous Every 24 hours 05/17/18 0733 05/17/18 0919   05/16/18 0900  vancomycin (VANCOCIN) IVPB 750 mg/150 ml premix  Status:  Discontinued     750 mg 150 mL/hr over 60 Minutes Intravenous Every 24 hours 05/16/18 0745 05/17/18 0733   05/15/18 0800  vancomycin (VANCOCIN) IVPB 750 mg/150 ml premix  Status:  Discontinued     750 mg 150 mL/hr over 60 Minutes  Intravenous Every 24 hours 05/14/18 0916 05/16/18 0745   04/19/2018 2200  meropenem (MERREM) 1 g in sodium chloride 0.9 % 100 mL IVPB  Status:  Discontinued     1 g 200 mL/hr over 30 Minutes Intravenous Every 12 hours 04/19/2018 1714 05/18/18 0956   05/12/18 1115  vancomycin (VANCOCIN) 1,250 mg in sodium chloride 0.9 % 250 mL IVPB     1,250 mg 166.7 mL/hr over 90 Minutes Intravenous  Once 05/12/18 1109 05/12/18 1348   05/12/18 1115  meropenem (MERREM) 500 mg in sodium chloride 0.9 % 100 mL IVPB  Status:  Discontinued     500 mg 200 mL/hr over 30 Minutes Intravenous Every 12 hours 05/12/18 1109 04/18/2018 1714   05/12/18 1109  vancomycin variable dose per unstable renal function (pharmacist dosing)  Status:  Discontinued      Does not apply See admin instructions 05/12/18 1109  05/14/18 0900   04/20/2018 1800  vancomycin (VANCOCIN) IVPB 1000 mg/200 mL premix     1,000 mg 200 mL/hr over 60 Minutes Intravenous Every 12 hours 04/22/2018 1357 04/26/2018 1759   05/01/2018 0540  vancomycin (VANCOCIN) IVPB 1000 mg/200 mL premix     1,000 mg 200 mL/hr over 60 Minutes Intravenous 60 min pre-op 04/24/2018 0540 04/19/2018 1504       Assessment/Plan AIOD with aortic occlusion - S/PAortobifemoral bypass wth 14 x 8 Hemashield graft, 04/23/2018, Dr. Arbie Cookey  Ischemic distal small bowel, right and left colon ischemia: - S/Pex lap with right and left colon resection, small bowel resection 2/27 Magnus Ivan)- S/Pileo-transverse colon anastomosis and left end colostomy 2/29 Janee Morn) - midline with wound vac in place - high output from colostomy. Down from 2,275(3/9) to 1650 (3/10) to 1050 (3/11), however unsure if this is the true amount given leakage and unmeasured stool per nurse. Goal 1L/day. Continue ImodiumTID and Lomotil. Will see colostomy and vac wound with WOC today.  - Electrolytes and fluid management per nephrology, had hemodialysis 3/11. Repeat labs this AM pending.  FEN:TF's@ goal, 70ml/hr;  VTE: SCD's,heparin for hemodialysis  EL:[id number] 03/04, Merremonce 03/06,WBCup to 13.1 (3/9), monitor, afebrile Follow up:TBD   LOS: 14 days    Gwendolyn Pham , [facility] 05/24/2018, 8:50 AM Pager: [phone]

## 2018-05-24 NOTE — Progress Notes (Signed)
Gwendolyn Pham   Subjective:   Brief history this 63 year old lady with a history of aortofemoral bypass, hypotension respiratory failure necrotic bowel requiring exploratory laparotomy, intubated anuric kidney injury requiring CRRT.  She has now been converted to intermittent hemodialysis.  We will plan next treatment 05/25/2018  She continues to be oliguric 500 cc were removed 05/23/2018 with dialysis.  1300 cc of stool  Blood pressure 98/59 pulse 50-60 temperature 98.3 O2 sats 100% room air  Sodium 137 potassium 3.7 chloride 103 CO2 25 BUN 49 creatinine 3.29 glucose 169 calcium 8.4 phosphorus was 7.9 Albumin 1.9 WBC 16.2 hemoglobin 9.0 platelets 245  Atorvastatin 80 mg daily insulin sliding scale Protonix 40 mg IV daily, Lopressor 5 mg every 8 hours IV, loperamide 4 mg 3 times daily   Objective:  Vital signs in last 24 hours:  Temp:  [97 F (36.1 C)-98.7 F (37.1 C)] 98.3 F (36.8 C) (03/11 0843) Pulse Rate:  [86-116] 86 (03/11 0359) Resp:  [17-30] 19 (03/11 0843) BP: (94-179)/(30-116) 98/59 (03/11 0843) SpO2:  [96 %-100 %] 100 % (03/11 0843) Weight:  [62.1 kg-63 kg] 62.1 kg (03/11 0444)  Weight change: 1.311 kg Filed Weights   05/23/18 1350 05/23/18 1720 05/24/18 0444  Weight: 63 kg 62.5 kg 62.1 kg    Intake/Output: I/O last 3 completed shifts: In: 1200 [NG/GT:1200] Out: 2400 [Drains:25; Other:500; Stool:1875]   Intake/Output this shift:  No intake/output data recorded.  CVS- RRR regular rate and rhythm RS- CTA no wheezes or rales ABD- BS present soft non-distended ex lap incision site clean dry dressing colostomy EXT-temporary dialysis catheter left IJ   Basic Metabolic Panel: Recent Labs  Lab 05/18/18 0356  05/19/18 0446 05/19/18 0641  05/20/18 0504 05/20/18 1611 05/21/18 0449 05/22/18 0413 05/23/18 1439  NA 137   < > 135  --    < > 137 140 141 145 149*  K 3.7   < > 6.1*  --    < > 3.1* 3.5 3.6 4.0 4.7  CL 101   < > 105  --     < > 107 111 114* 118* 122*  CO2 23   < > 19*  --    < > 24 22 18* 19* 15*  GLUCOSE 139*   < > 226*  --    < > 165* 148* 170* 191* 155*  BUN 15   < > 43*  --    < > 33* 48* 61* 87* 115*  CREATININE 1.22*   < > 2.50*  --    < > 2.10* 2.73* 3.18* 4.38* 5.38*  CALCIUM 8.0*   < > 7.6*  --    < > 7.7* 7.9* 7.7* 8.1* 8.6*  MG 2.3  --  2.2 2.2  --   --   --   --   --   --   PHOS 1.5*   < > 8.1*  --    < > 2.2* 2.8 3.5 5.3* 7.9*   < > = values in this interval not displayed.    Liver Function Tests: Recent Labs  Lab 05/18/18 0356  05/20/18 0504 05/20/18 1611 05/21/18 0449 05/22/18 0413 05/23/18 1439  AST 222*  --   --   --   --   --   --   ALT 172*  --   --   --   --   --   --   ALKPHOS 154*  --   --   --   --   --   --  BILITOT 2.0*  --   --   --   --   --   --   PROT 5.5*  --   --   --   --   --   --   ALBUMIN 1.6*  1.6*   < > 1.7* 1.7* 1.6* 1.7* 1.9*   < > = values in this interval not displayed.   No results for input(s): LIPASE, AMYLASE in the last 168 hours. No results for input(s): AMMONIA in the last 168 hours.  CBC: Recent Labs  Lab 05/19/18 0446 05/19/18 1352 05/22/18 0414 05/23/18 1439 05/24/18 0833  WBC 11.9* 11.9* 13.1* 15.1* 16.2*  HGB 9.2* 9.2* 8.5* 8.8* 9.0*  HCT 27.7* 27.4* 27.1* 27.3* 27.8*  MCV 92.6 91.9 96.1 97.5 93.3  PLT 90* 106* 197 268 245    Cardiac Enzymes: No results for input(s): CKTOTAL, CKMB, CKMBINDEX, TROPONINI in the last 168 hours.  BNP: Invalid input(s): POCBNP  CBG: Recent Labs  Lab 05/23/18 1601 05/23/18 2046 05/24/18 0110 05/24/18 0450 05/24/18 0912  GLUCAP 145* 119* 129* 133* 147*    Microbiology: Results for orders placed or performed during the hospital encounter of 04/23/2018  Culture, blood (Routine X 2) w Reflex to ID Panel     Status: None   Collection Time: 05/12/18  3:00 AM  Result Value Ref Range Status   Specimen Description BLOOD LEFT ARM  Final   Special Requests   Final    BOTTLES DRAWN AEROBIC AND  ANAEROBIC Blood Culture adequate volume   Culture   Final    NO GROWTH 5 DAYS Performed at [facility], [address] [place], [place] [address]    Report Status 05/17/2018 FINAL  Final  Culture, blood (Routine X 2) w Reflex to ID Panel     Status: None   Collection Time: 05/12/18  3:20 AM  Result Value Ref Range Status   Specimen Description BLOOD LEFT HAND  Final   Special Requests   Final    BOTTLES DRAWN AEROBIC ONLY Blood Culture adequate volume   Culture   Final    NO GROWTH 5 DAYS Performed at [facility], [address] [place], [place] [address]    Report Status 05/17/2018 FINAL  Final    Coagulation Studies: No results for input(s): LABPROT, INR in the last 72 hours.  Urinalysis: No results for input(s): COLORURINE, LABSPEC, PHURINE, GLUCOSEU, HGBUR, BILIRUBINUR, KETONESUR, PROTEINUR, UROBILINOGEN, NITRITE, LEUKOCYTESUR in the last 72 hours.  Invalid input(s): APPERANCEUR    Imaging: No results found.   Medications:   . sodium chloride    . feeding supplement (VITAL AF 1.2 CAL) 1,000 mL (05/24/18 0447)   . atorvastatin  80 mg Per Tube Daily  . Chlorhexidine Gluconate Cloth  6 each Topical Daily  . Chlorhexidine Gluconate Cloth  6 each Topical Q0600  . feeding supplement (PRO-STAT SUGAR FREE 64)  30 mL Per Tube TID  . Gerhardt's butt cream   Topical BID  . insulin aspart  0-9 Units Subcutaneous Q4H  . loperamide HCl  4 mg Per Tube TID  . mouth rinse  15 mL Mouth Rinse BID  . metoprolol tartrate  5 mg Intravenous Q8H  . pantoprazole (PROTONIX) IV  40 mg Intravenous Daily  . sodium chloride flush  10-40 mL Intracatheter Q12H   sodium chloride, acetaminophen **OR** acetaminophen, bisacodyl, diphenoxylate-atropine, fentaNYL (SUBLIMAZE) injection, labetalol, ondansetron, phenol, sodium chloride flush  Assessment/ Plan:   Oliguric acute kidney injury presumably  secondary to acute tubular necrosis from hypotension and possibly sepsis necrotic  bowel status post exploratory laparotomy with colostomy placement.  She was on CRRT from 2/29/2022 to 05/18/2018 with a temporary dialysis catheter placed in the left IJ.  Dialysis is planned for 05/24/2018 continue to avoid nephrotoxin agents and also IV contrast.  Does not appear to be return of renal function at this point.  Continue to follow urine output and renal panel on a daily basis.  She is oliguric  Acute respiratory failure mechanical ventilation followed by pulmonology and critical care patient now extubated and stable  History of septic shock due to ischemic and necrotic bowel now off pressors  Peripheral artery disease status post aorto bifemoral bypass 05/05/2018  Ischemic bowel status post small bowel resection and right and left colectomy with colostomy  History of thrombocytopenia no heparin with dialysis  High output through ostomy.  Continue to follow may need stool bulking agents will defer to general surgery    LOS: 14 Garnetta Buddy  :44 AM

## 2018-05-24 NOTE — Progress Notes (Signed)
  Speech Language Pathology Treatment: Dysphagia  Patient Details Name: Gwendolyn Pham MRN: [id number] DOB: 1956-01-20 Today's Date: 05/24/2018 Time: 0950-1002 SLP Time Calculation (min) (ACUTE ONLY): 12 min  Assessment / Plan / Recommendation Clinical Impression  Pt remains confused, talking about events, people, and things that are not actually present. She thinks she is at mental health. She needs Mod cues for sustained attention but with cueing she continues to have what appears to be a more appropriate oral phase of swallow, at least in terms of acceptance, clearance, and initiation of posterior transit. Throat clearing follows most thin liquid trials, but purees appear to be consumed without incidence. Recommend proceeding with MBS to better assess oropharyngeal function. Per radiology, can be completed this afternoon at the earliest.    HPI HPI: Pt is a 63 y.o. female who was admitted 2/26 for aorto femoral bypass.  2/27 had hypotension and respiratory distress.  Intubated that night, fould to be in septic shock, underwent emergent exploratory laparotomy, small bowel resection, bilateral colectomy. Extubated on 3/5 in am.  PMH: HTN, HLD, DM, depression, COPD, asthma, anxiety      SLP Plan  MBS       Recommendations  Diet recommendations: NPO Medication Administration: Via alternative means                Oral Care Recommendations: Oral care QID Follow up Recommendations: Inpatient Rehab SLP Visit Diagnosis: Dysphagia, unspecified (R13.10) Plan: MBS       GO                Gwendolyn Pham 05/24/2018, 10:09 AM  Gwendolyn Pham, M.A. CCC-SLP Acute Herbalist [phone] Office [phone]

## 2018-05-24 NOTE — Progress Notes (Signed)
Modified Barium Swallow Progress Note  Patient Details  Name: EZMERELDA MACCHIONE MRN: [id number] Date of Birth: 23-Jan-1956  Today's Date: 05/24/2018  Modified Barium Swallow completed.  Full report located under Chart Review in the Imaging Section.  Brief recommendations include the following:  Clinical Impression  Pt has a moderate oropharyngeal dysphagia due to cognitive impairments and generalized weakness. Swallow study was initiated in a semi-reclined position as pt was having difficulty tolerating an upright posture. She had poor bolus awareness and weak lingual propulsion, with subsequent reduced bolus cohesion, delayed transit, and premature spillage. Thin liquids were aspirated into the airway before the swallow; nectar thick liquids were only penetrated, and in trace amounts. Part way through the study pt was able to be repositioned into a higher, seated position, which resulted in no further airway compromise with nectar thick liquids. Thin liquids were still aspirated before the swallow. Although no aspiration was observed with nectar thick liquids or purees, she did have mild residue at the valleculae primarily due to reduced base of tongue retraction. Suspect pt would have improved pharyngeal clearance without cortrak present, but I'm not sure she could nutritionally support herself yet given her mentation. Would continue nutrition primarily via alternative means, but will initiate therapeutic trials of nectar thick liquids and purees to facilitate return to PO diet of these textures.    Swallow Evaluation Recommendations       SLP Diet Recommendations: Alternative means - temporary;NPO       Medication Administration: Via alternative means       Oral Care Recommendations: Oral care QID    Ernest Sak Allesandra Huebsch 05/24/2018,4:39 PM   Ivar Drape, M.A. CCC-SLP Acute Herbalist [phone] Office [phone]

## 2018-05-24 NOTE — Consult Note (Signed)
Physical Medicine and Rehabilitation Consult Reason for Consult: decreased functional mobility Referring Physician: Dr. Arbie Cookey  Chief complaint. Leg pain  HPI: JALESHA PLOTZ is a 63 y.o.right handed female with history of diabetes mellitus maintained on Glucophage 500 mg daily, hypertension maintained on HCTZ 25 mg daily, Lopressor 25 mg twice a day, hyperlipidemia maintained on Lipitor, COPD/tobacco abuse, anxiety maintained on Effexor or 150 mg daily as well as Wellbutrin 150 mg twice a day and peripheral vascular disease maintained on aspirin. Per chart review patient lives alone and was independent prior to admission. One level home with 5 steps to entry. Presented 05/12/2018 for advanced peripheral vascular disease and findings of aortoiliac occlusive disease with aortic occlusion. Underwent aortobifemoral bypass 04/30/2018 per Dr. Arbie Cookey. Postoperative with limited urine output receive 500 mL bolus findings of elevated creatinine 3.07 potassium 5.2 as well as hemoglobin 8.6 and orthostatic with critical care medicine consulted. She received a total of 3 L IV fluids but blood pressures remained borderline. Orders were given for transfusion. Patient did require intubation 04/17/2018 for airway protection. Underwent emergent exploratory laparotomy for significant blood loss requiring small bowel resection with right colectomy and left colectomy 04/27/2018 per Dr. Magnus Ivan as well as application of wound VAC. Reported altered mental status question blown pupil neurology consulted 05/12/2018. Cranial CT scan reviewed, unremarkable for acute intracranial process.  Per report, evidence of chronically advanced cerebral white matter disease no definite acute intracranial abnormality. Nephrology consulted for acute kidney injury renal ultrasound showed no hydronephrosis suspect ischemic ATN in setting of sepsis. Patient on CRRT from 2018/05/14 to 05/18/2018 with temporary dialysis catheter placed in the  left IJ. Suspect long-term dialysis needed. Patient was ultimately extubated 05/18/2018. Patient is nothing by mouth with nasogastric tube in place. Therapy evaluations have been completed with slow progressive gains recommendations of physical medicine rehabilitation consult.   Review of Systems  Unable to perform ROS: Mental acuity   Past Medical History:  Diagnosis Date   Anxiety    Asthma    COPD (chronic obstructive pulmonary disease) (HCC)    Depression    Diabetes mellitus without complication (HCC)    Hyperlipidemia    Hypertension    Past Surgical History:  Procedure Laterality Date   AORTA - BILATERAL FEMORAL ARTERY BYPASS GRAFT N/A 04/17/2018   Procedure: AORTA BIFEMORAL BYPASS GRAFT;  Surgeon: Larina Earthly, MD;  Location: [facility] OR;  Service: Vascular;  Laterality: N/A;   BOWEL RESECTION  05/06/2018   Procedure: Resection Small Bowel, LEFT AND RIGHT COLON;  Surgeon: Abigail Miyamoto, MD;  Location: [facility] OR;  Service: General;;   COLOSTOMY N/A 14-May-2018   Procedure: COLOSTOMY;  Surgeon: Violeta Gelinas, MD;  Location: [facility] OR;  Service: General;  Laterality: N/A;   DILATION AND CURETTAGE OF UTERUS     INNER EAR SURGERY     LAPAROTOMY N/A 05/04/2018   Procedure: EMERGENCY EXPLORATORY LAPAROTOMY;  Surgeon: Cephus Shelling, MD;  Location: [facility] OR;  Service: Vascular;  Laterality: N/A;   LAPAROTOMY N/A 2018/05/14   Procedure: EXPLORATORY LAPAROTOMY ILEOCOLONIC ANASTOMOS CLOSURE OF ABDOMEN;  Surgeon: Violeta Gelinas, MD;  Location: [facility] OR;  Service: General;  Laterality: N/A;   Family History  Problem Relation Age of Onset   Hypertension Mother    Diabetes Mother    COPD Father    Heart disease Father 45       AMI x 3/CABG   Diabetes Father    Diabetes Sister    Mental illness Sister  schizoaffective   Diabetes Sister    Arthritis Sister    Social History:  reports that she has been smoking cigarettes. She has a 25.00 pack-year smoking history.  She has never used smokeless tobacco. She reports that she does not drink alcohol or use drugs. Allergies:  Allergies  Allergen Reactions   Lisinopril Cough    Muscle pain    Penicillins Rash    Did it involve swelling of the face/tongue/throat, SOB, or low BP? No Did it involve sudden or severe rash/hives, skin peeling, or any reaction on the inside of your mouth or nose? No Did you need to seek medical attention at a hospital or doctor's office? No When did it last happen?Longer than 10 years ago If all above answers are NO, may proceed with cephalosporin use.    Medications Prior to Admission  Medication Sig Dispense Refill   acetaminophen (TYLENOL) 325 MG tablet Take 650 mg by mouth every 6 (six) hours as needed for moderate pain.      aspirin EC 81 MG tablet Take 1 tablet (81 mg total) by mouth daily. 90 tablet 3   atorvastatin (LIPITOR) 80 MG tablet Take 1 tablet (80 mg total) by mouth daily. 90 tablet 3   buPROPion (WELLBUTRIN SR) 150 MG 12 hr tablet Take 1 tablet (150 mg total) by mouth 2 (two) times daily. 60 tablet 2   Fluticasone-Salmeterol (ADVAIR) 100-50 MCG/DOSE AEPB Inhale 1 puff into the lungs 2 (two) times daily.     hydrochlorothiazide (HYDRODIURIL) 25 MG tablet Take 1 tablet (25 mg total) by mouth daily. 90 tablet 1   metFORMIN (GLUCOPHAGE) 500 MG tablet Take 1 tablet (500 mg total) by mouth daily with breakfast. 90 tablet 1   metoprolol tartrate (LOPRESSOR) 25 MG tablet Take 1 tablet (25 mg total) by mouth 2 (two) times daily. 180 tablet 1   venlafaxine XR (EFFEXOR-XR) 150 MG 24 hr capsule TAKE 1 CAPSULE BY MOUTH EVERY DAY WITH BREAKFAST (Patient taking differently: Take 150 mg by mouth daily with breakfast. ) 90 capsule 1    Home: Home Living Family/patient expects to be discharged to:: Private residence Living Arrangements: Alone Available Help at Discharge: Family, Available 24 hours/day(sister ) Type of Home: House Home Access: Stairs to  enter Secretary/administrator of Steps: 5 Entrance Stairs-Rails: Right Home Layout: One level Bathroom Shower/Tub: Engineer, manufacturing systems: Standard Home Equipment: None Additional Comments: lives with her dog and cat  Functional History: Prior Function Level of Independence: Independent Comments: enjoys riding horses Functional Status:  Mobility: Bed Mobility Overal bed mobility: Needs Assistance Bed Mobility: Rolling, Sidelying to Sit Rolling: Min assist Sidelying to sit: Mod assist Sit to supine: Min assist General bed mobility comments: Increased time and effort to perform. Assist to initiate movement to EOb, increased assist to elevate trunk to uprigt at EOB Transfers Overall transfer level: Needs assistance Equipment used: 2 person hand held assist Transfers: Sit to/from Stand, Stand Pivot Transfers Sit to Stand: Mod assist, +2 physical assistance, From elevated surface Stand pivot transfers: +2 safety/equipment, Min assist General transfer comment: Performed x2, increased effort noted for initiation of movement. Patient with noted flexed posture when transitioning to upright despite multi modal cues to reach erect position Ambulation/Gait General Gait Details: unable to perform    ADL: ADL Overall ADL's : Needs assistance/impaired Eating/Feeding: NPO Eating/Feeding Details (indicate cue type and reason): ice chips Grooming: Moderate assistance, Sitting, Wash/dry face Grooming Details (indicate cue type and reason): cues to initiate task (verbal  and physical)close monitoring due to NG tube Upper Body Bathing: Sitting, Maximal assistance Lower Body Bathing: Maximal assistance, +2 for physical assistance, +2 for safety/equipment, Sit to/from stand Upper Body Dressing : Maximal assistance, Sitting Lower Body Dressing: Maximal assistance, Sit to/from stand, +2 for safety/equipment, +2 for physical assistance Toilet Transfer: Moderate assistance, +2 for physical  assistance, +2 for safety/equipment Toileting- Clothing Manipulation and Hygiene: Total assistance Functional mobility during ADLs: Moderate assistance, +2 for physical assistance, +2 for safety/equipment(sit<Stand only this session) General ADL Comments: pain limiting  Cognition: Cognition Overall Cognitive Status: Impaired/Different from baseline Orientation Level: Oriented to person, Disoriented to place, Disoriented to time, Disoriented to situation Cognition Arousal/Alertness: Awake/alert Behavior During Therapy: Restless, Impulsive Overall Cognitive Status: Impaired/Different from baseline Area of Impairment: Safety/judgement, Following commands, Problem solving, Attention, Orientation Orientation Level: Disoriented to, Situation, Place Current Attention Level: Focused Following Commands: Follows one step commands with increased time Safety/Judgement: Decreased awareness of safety, Decreased awareness of deficits Problem Solving: Slow processing, Decreased initiation, Difficulty sequencing, Requires verbal cues, Requires tactile cues General Comments: Some impulsivity noted.  Blood pressure (!) 98/59, pulse 86, temperature 98.3 F (36.8 C), temperature source Oral, resp. rate 19, height 5\' 4"  (1.626 m), weight 62.1 kg, SpO2 100 %. Physical Exam  Vitals reviewed. Constitutional: She appears well-developed and well-nourished.  HENT:  Head: Normocephalic and atraumatic.  +NG  Eyes: EOM are normal. Right eye exhibits no discharge. Left eye exhibits no discharge.  Neck: Normal range of motion. Neck supple.  Cardiovascular: Regular rhythm.  +Tachycardia  Respiratory: Breath sounds normal.  +Tachypnea +  GI: Soft. Bowel sounds are normal.  Colostomy in place  Musculoskeletal:     Comments: No edema or tenderness in extremities  Neurological: She is alert.  Oriented x1 Motor: Limited due to participation RUE:?3-/5 proximal to distal LUE: 4+/5 proximal to distal RLE: 4+/5  proximal to distal LLE: not moving, despite encouragement  Skin:  Patient with wound VAC intact. Wet to dry dressing to abdominal wound.  Psychiatric: Her mood appears anxious. Her speech is delayed. She is slowed. Cognition and memory are impaired. She expresses impulsivity.  Restless Perseverative    Results for orders placed or performed during the hospital encounter of 05/12/2018 (from the past 24 hour(s))  Glucose, capillary     Status: Abnormal   Collection Time: 05/23/18 11:28 AM  Result Value Ref Range   Glucose-Capillary 137 (H) 70 - 99 mg/dL   Comment 1 Notify RN   CBC     Status: Abnormal   Collection Time: 05/23/18  2:39 PM  Result Value Ref Range   WBC 15.1 (H) 4.0 - 10.5 K/uL   RBC 2.80 (L) 3.87 - 5.11 MIL/uL   Hemoglobin 8.8 (L) 12.0 - 15.0 g/dL   HCT 23.7 (L) 62.8 - 31.5 %   MCV 97.5 80.0 - 100.0 fL   MCH 31.4 26.0 - 34.0 pg   MCHC 32.2 30.0 - 36.0 g/dL   RDW 17.6 (H) 16.0 - 73.7 %   Platelets 268 150 - 400 K/uL   nRBC 0.0 0.0 - 0.2 %  Renal function panel     Status: Abnormal   Collection Time: 05/23/18  2:39 PM  Result Value Ref Range   Sodium 149 (H) 135 - 145 mmol/L   Potassium 4.7 3.5 - 5.1 mmol/L   Chloride 122 (H) 98 - 111 mmol/L   CO2 15 (L) 22 - 32 mmol/L   Glucose, Bld 155 (H) 70 - 99 mg/dL   BUN  115 (H) 8 - 23 mg/dL   Creatinine, Ser 2.44 (H) 0.44 - 1.00 mg/dL   Calcium 8.6 (L) 8.9 - 10.3 mg/dL   Phosphorus 7.9 (H) 2.5 - 4.6 mg/dL   Albumin 1.9 (L) 3.5 - 5.0 g/dL   GFR calc non Af Amer 8 (L) >60 mL/min   GFR calc Af Amer 9 (L) >60 mL/min   Anion gap 12 5 - 15  Glucose, capillary     Status: Abnormal   Collection Time: 05/23/18  4:01 PM  Result Value Ref Range   Glucose-Capillary 145 (H) 70 - 99 mg/dL  Glucose, capillary     Status: Abnormal   Collection Time: 05/23/18  8:46 PM  Result Value Ref Range   Glucose-Capillary 119 (H) 70 - 99 mg/dL  Glucose, capillary     Status: Abnormal   Collection Time: 05/24/18  1:10 AM  Result Value Ref  Range   Glucose-Capillary 129 (H) 70 - 99 mg/dL  Glucose, capillary     Status: Abnormal   Collection Time: 05/24/18  4:50 AM  Result Value Ref Range   Glucose-Capillary 133 (H) 70 - 99 mg/dL  CBC     Status: Abnormal   Collection Time: 05/24/18  8:33 AM  Result Value Ref Range   WBC 16.2 (H) 4.0 - 10.5 K/uL   RBC 2.98 (L) 3.87 - 5.11 MIL/uL   Hemoglobin 9.0 (L) 12.0 - 15.0 g/dL   HCT 01.0 (L) 27.2 - 53.6 %   MCV 93.3 80.0 - 100.0 fL   MCH 30.2 26.0 - 34.0 pg   MCHC 32.4 30.0 - 36.0 g/dL   RDW 64.4 03.4 - 74.2 %   Platelets 245 150 - 400 K/uL   nRBC 0.0 0.0 - 0.2 %  Basic metabolic panel     Status: Abnormal   Collection Time: 05/24/18  8:33 AM  Result Value Ref Range   Sodium 141 135 - 145 mmol/L   Potassium 3.7 3.5 - 5.1 mmol/L   Chloride 103 98 - 111 mmol/L   CO2 25 22 - 32 mmol/L   Glucose, Bld 169 (H) 70 - 99 mg/dL   BUN 49 (H) 8 - 23 mg/dL   Creatinine, Ser 5.95 (H) 0.44 - 1.00 mg/dL   Calcium 8.4 (L) 8.9 - 10.3 mg/dL   GFR calc non Af Amer 14 (L) >60 mL/min   GFR calc Af Amer 16 (L) >60 mL/min   Anion gap 13 5 - 15  Magnesium     Status: None   Collection Time: 05/24/18  8:33 AM  Result Value Ref Range   Magnesium 1.7 1.7 - 2.4 mg/dL  Glucose, capillary     Status: Abnormal   Collection Time: 05/24/18  9:12 AM  Result Value Ref Range   Glucose-Capillary 147 (H) 70 - 99 mg/dL   Comment 1 Notify RN    Comment 2 Document in Chart    No results found.  Assessment/Plan: Diagnosis: Debility Labs and images (see above) independently reviewed.  Records reviewed and summated above.  1. Does the need for close, 24 hr/day medical supervision in concert with the patient's rehab needs make it unreasonable for this patient to be served in a less intensive setting? Yes  2. Co-Morbidities requiring supervision/potential complications:  DM (Monitor in accordance with exercise and adjust meds as necessary), HTN (monitor and provide prns in accordance with increased physical  exertion and pain), hyperlipidemia, COPD/tobacco abuse (monitor RR and O2 sats with increased mobility), anxiety (ensure anxiety  and resulting apprehension do not limit functional progress; consider prn medications if warranted), peripheral vascular disease (cont meds), AKI (cont to monitor, recs per Nephro), leukocytosis (repeat labs, cont to monitor for signs and symptoms of infection, further workup if indicated), ABLA (repeat labs, consider transfusion if necessary to ensure appropriate perfusion for increased activity tolerance) 3. Due to bladder management, bowel management, safety, disease management, medication administration and patient education, does the patient require 24 hr/day rehab nursing? Yes 4. Does the patient require coordinated care of a physician, rehab nurse, PT (1-2 hrs/day, 5 days/week), OT (1-2 hrs/day, 5 days/week) and SLP (1-2 hrs/day, 5 days/week) to address physical and functional deficits in the context of the above medical diagnosis(es)? Yes Addressing deficits in the following areas: balance, endurance, locomotion, strength, transferring, bowel/bladder control, bathing, dressing, toileting, cognition, language, swallowing and psychosocial support 5. Can the patient actively participate in an intensive therapy program of at least 3 hrs of therapy per day at least 5 days per week? Potentially 6. The potential for patient to make measurable gains while on inpatient rehab is excellent 7. Anticipated functional outcomes upon discharge from inpatient rehab are supervision and min assist  with PT, supervision and min assist with OT, supervision with SLP. 8. Estimated rehab length of stay to reach the above functional goals is: 16-19 days. 9. Anticipated D/C setting: Home 10. Anticipated post D/C treatments: HH therapy and Home excercise program 11. Overall Rehab/Functional Prognosis: good  RECOMMENDATIONS: This patient's condition is appropriate for continued rehabilitative  care in the following setting: CIR if caregiver support available upon discharge. Patient has agreed to participate in recommended program. Potentially Note that insurance prior authorization may be required for reimbursement for recommended care.  Comment: Rehab Admissions Coordinator to follow up.   I have personally performed a face to face diagnostic evaluation, including, but not limited to relevant history and physical exam findings, of this patient and developed relevant assessment and plan.  Additionally, I have reviewed and concur with the physician assistant's documentation above.   Maryla Morrow, MD, ABPMR Mcarthur Rossetti Angiulli, PA-C 05/24/2018

## 2018-05-24 NOTE — Progress Notes (Signed)
Pt LLQ colostomy changed 4 times during shift due to a large amount of loose runny stools being output causing leaking. Unable to determine true output due to contents leaking into the bed.  Area around stoma is reddened and irritated. Area has been cleansed will continue to monitor.

## 2018-05-24 NOTE — Consult Note (Signed)
WOC Nurse ostomy follow up Stoma type/location: LUQ transverse colostomy. Seen with CCS PA, M. Maczis, who a took at photo. Stomal assessment/size: 1 and 1/4 inches x 1 and 5/8 inches oval, edematous at superior half, flat at inferior half.  Os at 6 o'clock and flush with abdomen Peristomal assessment: intact with PMASD (unbroken erythema) in the immediate peristomal area.  Erythema that extends beyond pouch adhesive at 3 and 5 o'clock. Treatment options for stomal/peristomal skin: convex pouch, belt.  No rings available today, will continue ring with next visit. Belt applied, but it is too large for patient's girth: will bring medium belt to room with next change. Output: liquid brown effluent Ostomy pouching: 1pc.flexible convex  Education provided: None. Patient confused and flailing arms and legs about the bed during pouch and wound dressing change. Sister not in room until visit is ending. Enrolled patient in [place] Secure Start Discharge program: No   WOC Nurse wound follow up Wound type: Surgical. Seen today for routine NPWT dressing change Measurement:21cm x 4cm x 3cm Wound bed: somewhat of a false bottom noted today. 60% red, moist wound bed, 40% adipose tissue (yellow) Drainage (amount, consistency, odor) small amount serous Periwound:intact with periwound maceration, most severe at 3 o'clock (umbilicus) area Dressing procedure/placement/frequency: one piece of black foam removed from wound, wound cleansed with NS, and gently patted dry.  Periwound protected with three skin barrier rings, torn and stretched to accommodate wound contours. One piece of black foam used to obliterate dead space, drape applied and dressing attached to continuous negative pressure.  AN immediate seal is achieved and dressing is labeled.  I was assisted in this care today by WTA-C, [facility] Barnett Hatter.  Her assistance is appreciated.  WOC nursing team will follow, and will remain available to this  patient, the nursing and medical teams. Next dressing change is due on Friday, 3/13. Will change ostomy at same time. Thanks, Ladona Mow, MSN, RN, GNP, [place]  Pager# [phone]

## 2018-05-25 ENCOUNTER — Inpatient Hospital Stay (HOSPITAL_COMMUNITY): Payer: Medicare Other

## 2018-05-25 LAB — RENAL FUNCTION PANEL
ALBUMIN: 2 g/dL — AB (ref 3.5–5.0)
Anion gap: 15 (ref 5–15)
BUN: 81 mg/dL — AB (ref 8–23)
CO2: 23 mmol/L (ref 22–32)
Calcium: 8.9 mg/dL (ref 8.9–10.3)
Chloride: 103 mmol/L (ref 98–111)
Creatinine, Ser: 4.8 mg/dL — ABNORMAL HIGH (ref 0.44–1.00)
GFR calc Af Amer: 10 mL/min — ABNORMAL LOW (ref >60)
GFR calc non Af Amer: 9 mL/min — ABNORMAL LOW (ref >60)
Glucose, Bld: 178 mg/dL — ABNORMAL HIGH (ref 70–99)
Phosphorus: 9.4 mg/dL — ABNORMAL HIGH (ref 2.5–4.6)
Potassium: 3.8 mmol/L (ref 3.5–5.1)
Sodium: 141 mmol/L (ref 135–145)

## 2018-05-25 LAB — GLUCOSE, CAPILLARY
GLUCOSE-CAPILLARY: 147 mg/dL — AB (ref 70–99)
Glucose-Capillary: 141 mg/dL — ABNORMAL HIGH (ref 70–99)
Glucose-Capillary: 167 mg/dL — ABNORMAL HIGH (ref 70–99)
Glucose-Capillary: 168 mg/dL — ABNORMAL HIGH (ref 70–99)
Glucose-Capillary: 168 mg/dL — ABNORMAL HIGH (ref 70–99)
Glucose-Capillary: 183 mg/dL — ABNORMAL HIGH (ref 70–99)

## 2018-05-25 LAB — CBC
HCT: 27.8 % — ABNORMAL LOW (ref 36.0–46.0)
Hemoglobin: 9.3 g/dL — ABNORMAL LOW (ref 12.0–15.0)
MCH: 31.2 pg (ref 26.0–34.0)
MCHC: 33.5 g/dL (ref 30.0–36.0)
MCV: 93.3 fL (ref 80.0–100.0)
Platelets: 269 10*3/uL (ref 150–400)
RBC: 2.98 MIL/uL — ABNORMAL LOW (ref 3.87–5.11)
RDW: 15.3 % (ref 11.5–15.5)
WBC: 15.3 10*3/uL — ABNORMAL HIGH (ref 4.0–10.5)
nRBC: 0 % (ref 0.0–0.2)

## 2018-05-25 MED ORDER — HEPARIN SODIUM (PORCINE) 1000 UNIT/ML IJ SOLN
INTRAMUSCULAR | Status: AC
  Administered 2018-05-25: 1000 [IU] via INTRAVENOUS_CENTRAL
  Filled 2018-05-25: qty 3

## 2018-05-25 MED ORDER — SODIUM CHLORIDE 0.9 % IV SOLN: 100.0000 mL | INTRAVENOUS | Status: DC | PRN

## 2018-05-25 MED ORDER — FENTANYL CITRATE (PF) 100 MCG/2ML IJ SOLN
INTRAMUSCULAR | Status: AC
  Filled 2018-05-25: qty 2

## 2018-05-25 MED ORDER — HEPARIN SODIUM (PORCINE) 1000 UNIT/ML DIALYSIS
1000.0000 [IU] | INTRAMUSCULAR | Status: DC | PRN
  Administered 2018-05-25: 1000 [IU] via INTRAVENOUS_CENTRAL
  Filled 2018-05-25 (×2): qty 1

## 2018-05-25 MED ORDER — ALTEPLASE 2 MG IJ SOLR
2.0000 mg | Freq: Once | INTRAMUSCULAR | Status: DC | PRN
  Filled 2018-05-25: qty 2

## 2018-05-25 MED ORDER — ACETAMINOPHEN 160 MG/5ML PO SOLN
325.0000 mg | ORAL | Status: DC | PRN
  Administered 2018-05-25 – 2018-05-27 (×2): 650 mg
  Filled 2018-05-25 (×2): qty 20.3

## 2018-05-25 NOTE — Progress Notes (Signed)
PT Cancellation Note  Patient Details Name: Gwendolyn Pham MRN: [id number] DOB: 01-24-1956   Cancelled Treatment:    Reason Eval/Treat Not Completed: Fatigue/lethargy limiting ability to participate.  Not feeling well and asked to wait until tomorrow.  Follow up in the AM.   Ivar Drape 05/25/2018, 5:29 PM  Samul Dada, PT MS Acute Rehab Dept. Number: [facility] [id number] and [facility] [phone]

## 2018-05-25 NOTE — Progress Notes (Signed)
HD tx stopped per nephrologist due to decreased alertness and hypotension. Pt given 500 cc fluid and rapid response RN called. Pt stabilized and transferred back to her room. Unable to reach attending MD, assistant notified.

## 2018-05-25 NOTE — Progress Notes (Signed)
  Speech Language Pathology Treatment: Dysphagia  Patient Details Name: Gwendolyn Pham MRN: [id number] DOB: [birth year] Today's Date: 05/25/2018 Time: 9758-8325 SLP Time Calculation (min) (ACUTE ONLY): 13 min  Assessment / Plan / Recommendation Clinical Impression  Pt continues to exhibit confusion and positioning interferes with ability to initiate PO diet.  Pt c/o of back pain with upright positioning.  Pt was able to tolerated HOB raised fully for around 10-12 minutes.  Pt stated back pain was 10/10 and endorsed desire for medication; however, pt did not appear in distress and was easily distracted.  RN aware.  With PO trials pt tolerated nectar thick liquid by cup and spoon.  Pt exhibited reflexive cough on 2 of 5 trials of puree. Given persistent confusion and poor tolerance of upright positioning, which was noted to assist in airway protection on MBSS completed 3/11, recommend continuing alternate means of nutrition at this time.  Pt was able to complete some swallowing exercises as noted below. Pt will not be able to continue HEP independently given cognitive status.  Effortful Swallow No of reps: 10 Effort/Accuracy: unable to assess  CTAR No of reps: 10 Effort/Accuracy: Good  [place] No of reps: 0 Effort/Accuracy: Poor, unable to complete despite max cuing   HPI HPI: Pt is a 63 y.o. female who was admitted 2/26 for aorto femoral bypass. 2/27 had hypotension and respiratory distress. Intubated that night, fould to be in septic shock, underwent emergent exploratory laparotomy, small bowel resection, bilateral colectomy. Extubated on 3/5 in am. PMH: HTN, HLD, DM, depression, COPD, asthma, anxiety.  New CXR 3/12 following MBSS with results pending      SLP Plan  Continue with current plan of care       Recommendations  Diet recommendations: NPO Medication Administration: Via alternative means                Oral Care Recommendations: Oral care QID Follow up  Recommendations: (Continue ST at next level of care) SLP Visit Diagnosis: Dysphagia, oropharyngeal phase (R13.12) Plan: Continue with current plan of care       GO                Kerrie Pleasure, MA, CCC-SLP Office: [phone]; Pager (3/12): [phone] 05/25/2018, 2:09 PM

## 2018-05-25 NOTE — Progress Notes (Signed)
Patient ID: Gwendolyn Pham, female   DOB: 05-07-1955, 63 y.o.   MRN: [id number]  Progress Note    05/25/2018 10:20 AM 12 Days Post-Op  Subjective: Alert.  Oriented to self.  Answering questions.  Had to discontinue hemodialysis due to hypotension.  Blood pressure 115 systolic currently.   Vitals:   05/25/18 0305 05/25/18 0500  BP: 123/72 122/84  Pulse:    Resp: (!) 21 17  Temp: 97.8 F (36.6 C)   SpO2: 95%    Physical Exam: Abdomen soft.  Output from colostomy.  2+ dorsalis pedis pulses bilaterally.  CBC    Component Value Date/Time   WBC 15.3 (H) 05/25/2018 0714   RBC 2.98 (L) 05/25/2018 0714   HGB 9.3 (L) 05/25/2018 0714   HGB 15.8 01/23/2018 1218   HCT 27.8 (L) 05/25/2018 0714   HCT 45.9 01/23/2018 1218   PLT 269 05/25/2018 0714   PLT 253 01/23/2018 1218   MCV 93.3 05/25/2018 0714   MCV 93 01/23/2018 1218   MCH 31.2 05/25/2018 0714   MCHC 33.5 05/25/2018 0714   RDW 15.3 05/25/2018 0714   RDW 13.7 01/23/2018 1218   LYMPHSABS 1.1 04/24/2018 1802   LYMPHSABS 2.8 01/23/2018 1218   MONOABS 0.3 04/22/2018 1802   EOSABS 0.0 05/09/2018 1802   EOSABS 0.1 01/23/2018 1218   BASOSABS 0.0 04/23/2018 1802   BASOSABS 0.1 01/23/2018 1218    BMET    Component Value Date/Time   NA 141 05/25/2018 0714   NA 137 01/23/2018 1218   K 3.8 05/25/2018 0714   CL 103 05/25/2018 0714   CO2 23 05/25/2018 0714   GLUCOSE 178 (H) 05/25/2018 0714   BUN 81 (H) 05/25/2018 0714   BUN 11 01/23/2018 1218   CREATININE 4.80 (H) 05/25/2018 0714   CREATININE 0.66 06/18/2015 1855   CALCIUM 8.9 05/25/2018 0714   GFRNONAA 9 (L) 05/25/2018 0714   GFRNONAA >89 01/31/2014 1532   GFRAA 10 (L) 05/25/2018 0714   GFRAA >89 01/31/2014 1532    INR    Component Value Date/Time   INR 1.4 (H) 05/12/2018 0415     Intake/Output Summary (Last 24 hours) at 05/25/2018 1020 Last data filed at 05/25/2018 0700 Gross per 24 hour  Intake 2040 ml  Output 1525 ml  Net 515 ml     Assessment/Plan:  63  y.o. female currently stable.  Hypotensive with dialysis.  Continuing with tube feeds.  Will allow ice chips.  She did have an incomplete attempt at barium swallow for swallowing evaluation yesterday.  Some aspiration noted     Larina Earthly, MD [facility] [phone] 05/25/2018 10:20 AM

## 2018-05-25 NOTE — Significant Event (Signed)
Rapid Response Event Note  Overview:  RN Eric from HD called reports pt is unresponsive.     Initial Focused Assessment: On arrival, pt lying supine in bed, responding to noxious stimulation, skin cool, clammy and pale. Oriented to self and location only. SBP 68-74. She received one hour of dialysis before stopping treatment due to pt unresponsive. Dr. Arbie Cookey made aware. SBP 90's after returning to 4E.    Interventions:  Plan of Care (if not transferred): Continue to monitor. Call RRT as needed.   Event Summary: [id number]   at      at          [facility], [facility]

## 2018-05-25 NOTE — Progress Notes (Signed)
12 Days Post-Op   Subjective/Chief Complaint: Pt confused  Says she hurts all over    Objective: Vital signs in last 24 hours: Temp:  [97.8 F (36.6 C)-98.4 F (36.9 C)] 97.8 F (36.6 C) (03/12 0305) Pulse Rate:  [102-116] 116 (03/11 2014) Resp:  [17-27] 17 (03/12 0500) BP: (90-123)/(56-84) 122/84 (03/12 0500) SpO2:  [95 %-100 %] 95 % (03/12 0305) Weight:  [58.1 kg] 58.1 kg (03/12 0500) Last BM Date: 05/24/18  Intake/Output from previous day: 03/11 0701 - 03/12 0700 In: 2040 [I.V.:40; NG/GT:2000] Out: 1525 [Urine:50; Drains:50; Stool:1425] Intake/Output this shift: No intake/output data recorded.  wound vac in place ostomy in place   Lab Results:  Recent Labs    05/24/18 0833 05/25/18 0714  WBC 16.2* 15.3*  HGB 9.0* 9.3*  HCT 27.8* 27.8*  PLT 245 269   BMET Recent Labs    05/24/18 0833 05/25/18 0714  NA 141 141  K 3.7 3.8  CL 103 103  CO2 25 23  GLUCOSE 169* 178*  BUN 49* 81*  CREATININE 3.29* 4.80*  CALCIUM 8.4* 8.9   PT/INR No results for input(s): LABPROT, INR in the last 72 hours. ABG No results for input(s): PHART, HCO3 in the last 72 hours.  Invalid input(s): PCO2, PO2  Studies/Results: Dg Swallowing Func-speech Pathology  Result Date: 05/24/2018 Objective Swallowing Evaluation: Type of Study: MBS-Modified Barium Swallow Study  Patient Details Name: Gwendolyn Pham MRN: [id number] Date of Birth: [birth year] Today's Date: 05/24/2018 Time: SLP Start Time (ACUTE ONLY): 1512 -SLP Stop Time (ACUTE ONLY): 1529 SLP Time Calculation (min) (ACUTE ONLY): 17 min Past Medical History: Past Medical History: Diagnosis Date . Anxiety  . Asthma  . COPD (chronic obstructive pulmonary disease) (HCC)  . Depression  . Diabetes mellitus without complication (HCC)  . Hyperlipidemia  . Hypertension  Past Surgical History: Past Surgical History: Procedure Laterality Date . AORTA - BILATERAL FEMORAL ARTERY BYPASS GRAFT N/A 04/29/2018  Procedure: AORTA BIFEMORAL BYPASS GRAFT;   Surgeon: Larina EarthlyEarly, Todd F, MD;  [facility];  Service: Vascular;  Laterality: N/A; . BOWEL RESECTION  04-02-18  Procedure: Resection Small Bowel, LEFT AND RIGHT COLON;  Surgeon: Abigail MiyamotoBlackman, Douglas, MD;  [facility];  Service: General;; . COLOSTOMY N/A 04/21/2018  Procedure: COLOSTOMY;  Surgeon: Violeta Gelinashompson, Burke, MD;  [facility];  Service: General;  Laterality: N/A; . DILATION AND CURETTAGE OF UTERUS   . INNER EAR SURGERY   . LAPAROTOMY N/A 04-02-18  Procedure: EMERGENCY EXPLORATORY LAPAROTOMY;  Surgeon: Cephus Shellinglark, Christopher J, MD;  [facility];  Service: Vascular;  Laterality: N/A; . LAPAROTOMY N/A 04/17/2018  Procedure: EXPLORATORY LAPAROTOMY ILEOCOLONIC ANASTOMOS CLOSURE OF ABDOMEN;  Surgeon: Violeta Gelinashompson, Burke, MD;  [facility];  Service: General;  Laterality: N/A; HPI: Pt is a 63 y.o. female who was admitted 2/26 for aorto femoral bypass.  2/27 had hypotension and respiratory distress.  Intubated that night, fould to be in septic shock, underwent emergent exploratory laparotomy, small bowel resection, bilateral colectomy. Extubated on 3/5 in am.  PMH: HTN, HLD, DM, depression, COPD, asthma, anxiety  Subjective: pt is alert but very distractible Assessment / Plan / Recommendation CHL IP CLINICAL IMPRESSIONS 05/24/2018 Clinical Impression Pt has a moderate oropharyngeal dysphagia due to cognitive impairments and generalized weakness. Swallow study was initiated in a semi-reclined position as pt was having difficulty tolerating an upright posture. She had poor bolus awareness and weak lingual propulsion, with subsequent reduced bolus cohesion, delayed transit, and premature spillage. Thin liquids were aspirated into the  airway before the swallow; nectar thick liquids were only penetrated, and in trace amounts. Part way through the study pt was able to be repositioned into a higher, seated position, which resulted in no further airway compromise with nectar thick liquids. Thin liquids were still aspirated  before the swallow. Although no aspiration was observed with nectar thick liquids or purees, she did have mild residue at the valleculae primarily due to reduced base of tongue retraction. Suspect pt would have improved pharyngeal clearance without cortrak present, but I'm not sure she could nutritionally support herself yet given her mentation. Would continue nutrition primarily via alternative means, but will initiate therapeutic trials of nectar thick liquids and purees to facilitate return to PO diet of these textures.  SLP Visit Diagnosis Dysphagia, oropharyngeal phase (R13.12) Attention and concentration deficit following -- Frontal lobe and executive function deficit following -- Impact on safety and function Moderate aspiration risk   CHL IP TREATMENT RECOMMENDATION 05/24/2018 Treatment Recommendations Therapy as outlined in treatment plan below   Prognosis 05/24/2018 Prognosis for Safe Diet Advancement Good Barriers to Reach Goals Cognitive deficits Barriers/Prognosis Comment -- CHL IP DIET RECOMMENDATION 05/24/2018 SLP Diet Recommendations Alternative means - temporary;NPO Liquid Administration via -- Medication Administration Via alternative means Compensations -- Postural Changes --   CHL IP OTHER RECOMMENDATIONS 05/24/2018 Recommended Consults -- Oral Care Recommendations Oral care QID Other Recommendations --   CHL IP FOLLOW UP RECOMMENDATIONS 05/24/2018 Follow up Recommendations Inpatient Rehab   CHL IP FREQUENCY AND DURATION 05/24/2018 Speech Therapy Frequency (ACUTE ONLY) min 2x/week Treatment Duration 2 weeks      CHL IP ORAL PHASE 05/24/2018 Oral Phase Impaired Oral - Pudding Teaspoon -- Oral - Pudding Cup -- Oral - Honey Teaspoon -- Oral - Honey Cup -- Oral - Nectar Teaspoon -- Oral - Nectar Cup -- Oral - Nectar Straw Decreased bolus cohesion;Delayed oral transit;Premature spillage Oral - Thin Teaspoon -- Oral - Thin Cup -- Oral - Thin Straw Decreased bolus cohesion;Delayed oral transit;Premature  spillage Oral - Puree Decreased bolus cohesion;Delayed oral transit Oral - Mech Soft -- Oral - Regular -- Oral - Multi-Consistency -- Oral - Pill -- Oral Phase - Comment --  CHL IP PHARYNGEAL PHASE 05/24/2018 Pharyngeal Phase Impaired Pharyngeal- Pudding Teaspoon -- Pharyngeal -- Pharyngeal- Pudding Cup -- Pharyngeal -- Pharyngeal- Honey Teaspoon -- Pharyngeal -- Pharyngeal- Honey Cup -- Pharyngeal -- Pharyngeal- Nectar Teaspoon -- Pharyngeal -- Pharyngeal- Nectar Cup -- Pharyngeal -- Pharyngeal- Nectar Straw Reduced pharyngeal peristalsis;Reduced tongue base retraction;Pharyngeal residue - valleculae;Penetration/Aspiration before swallow Pharyngeal Material enters airway, remains ABOVE vocal cords and not ejected out Pharyngeal- Thin Teaspoon -- Pharyngeal -- Pharyngeal- Thin Cup -- Pharyngeal -- Pharyngeal- Thin Straw Reduced pharyngeal peristalsis;Reduced tongue base retraction;Pharyngeal residue - valleculae;Penetration/Aspiration before swallow Pharyngeal Material enters airway, passes BELOW cords and not ejected out despite cough attempt by patient Pharyngeal- Puree Reduced pharyngeal peristalsis;Reduced tongue base retraction;Pharyngeal residue - valleculae Pharyngeal -- Pharyngeal- Mechanical Soft -- Pharyngeal -- Pharyngeal- Regular -- Pharyngeal -- Pharyngeal- Multi-consistency -- Pharyngeal -- Pharyngeal- Pill -- Pharyngeal -- Pharyngeal Comment --  CHL IP CERVICAL ESOPHAGEAL PHASE 05/24/2018 Cervical Esophageal Phase Impaired Pudding Teaspoon -- Pudding Cup -- Honey Teaspoon -- Honey Cup -- Nectar Teaspoon -- Nectar Cup -- Nectar Straw Reduced cricopharyngeal relaxation Thin Teaspoon -- Thin Cup -- Thin Straw Reduced cricopharyngeal relaxation Puree Reduced cricopharyngeal relaxation Mechanical Soft -- Regular -- Multi-consistency -- Pill -- Cervical Esophageal Comment -- Abbigail Regis Nix 05/24/2018, 4:41 PM  Ivar Drape, M.A. CCC-SLP Acute Herbalist (  [phone] Office [phone]               Anti-infectives: Anti-infectives (From admission, onward)   Start     Dose/Rate Route Frequency Ordered Stop   05/19/18 0900  meropenem (MERREM) 500 mg in sodium chloride 0.9 % 100 mL IVPB     500 mg 200 mL/hr over 30 Minutes Intravenous  Once 05/18/18 0956 05/19/18 0825   05/17/18 1130  vancomycin (VANCOCIN) IVPB 750 mg/150 ml premix  Status:  Discontinued     750 mg 150 mL/hr over 60 Minutes Intravenous Every 24 hours 05/17/18 0733 05/17/18 0919   05/16/18 0900  vancomycin (VANCOCIN) IVPB 750 mg/150 ml premix  Status:  Discontinued     750 mg 150 mL/hr over 60 Minutes Intravenous Every 24 hours 05/16/18 0745 05/17/18 0733   05/15/18 0800  vancomycin (VANCOCIN) IVPB 750 mg/150 ml premix  Status:  Discontinued     750 mg 150 mL/hr over 60 Minutes Intravenous Every 24 hours 05/14/18 0916 05/16/18 0745   04/22/2018 2200  meropenem (MERREM) 1 g in sodium chloride 0.9 % 100 mL IVPB  Status:  Discontinued     1 g 200 mL/hr over 30 Minutes Intravenous Every 12 hours 05/03/2018 1714 05/18/18 0956   05/12/18 1115  vancomycin (VANCOCIN) 1,250 mg in sodium chloride 0.9 % 250 mL IVPB     1,250 mg 166.7 mL/hr over 90 Minutes Intravenous  Once 05/12/18 1109 05/12/18 1348   05/12/18 1115  meropenem (MERREM) 500 mg in sodium chloride 0.9 % 100 mL IVPB  Status:  Discontinued     500 mg 200 mL/hr over 30 Minutes Intravenous Every 12 hours 05/12/18 1109 04/18/2018 1714   05/12/18 1109  vancomycin variable dose per unstable renal function (pharmacist dosing)  Status:  Discontinued      Does not apply See admin instructions 05/12/18 1109 05/14/18 0900   June 03, 2018 1800  vancomycin (VANCOCIN) IVPB 1000 mg/200 mL premix     1,000 mg 200 mL/hr over 60 Minutes Intravenous Every 12 hours Jun 03, 2018 1357 05/05/2018 1759   06/03/2018 0540  vancomycin (VANCOCIN) IVPB 1000 mg/200 mL premix     1,000 mg 200 mL/hr over 60 Minutes Intravenous 60 min pre-op 06/03/18 0540 06/03/18 1504      Assessment/Plan: s/p  Procedure(s): EXPLORATORY LAPAROTOMY ILEOCOLONIC ANASTOMOS CLOSURE OF ABDOMEN (N/A) COLOSTOMY (N/A)  AIOD with aortic occlusion - S/PAortobifemoral bypass wth 14 x 8 Hemashield graft, Jun 03, 2018, Dr. Arbie Cookey  Ischemic distal small bowel, right and left colon ischemia: - S/Pex lap with right and left colon resection, small bowel resection 2/27 Magnus Ivan)- S/Pileo-transverse colon anastomosis and left end colostomy 2/29 Janee Morn) - midline with wound vacin place -high output from colostomy. Down from2,275(3/9) to 1650 (3/10) to 1050 (3/11), however unsure if this is the true amount given leakage and unmeasured stool per nurse. Up to 1400 cc last 24 hours  Goal 1L/day. Continue ImodiumTID and Lomotil. Will see colostomy and vac wound with WOC today.  - Electrolytes and fluid management per nephrology, had hemodialysis 3/11. Repeat labs this AM pending.  FEN:TF's@ goal, 51ml/hr; VTE: SCD's,heparin forhemodialysis TE:[id number] 03/04, Merremonce 03/06,WBCup to 13.1(3/9), monitor, afebrile Follow up:TBD    LOS: 15 days    Clovis Pu Jacklyne Baik 05/25/2018

## 2018-05-25 NOTE — Progress Notes (Signed)
Gwendolyn Pham   Subjective:   Brief history this 63 year old lady with a history of aortofemoral bypass, hypotension respiratory failure necrotic bowel requiring exploratory laparotomy, intubated anuric kidney injury requiring CRRT.  She has now been converted to intermittent hemodialysis.  Received dialysis 05/25/2018 complicated by hypotension and shock.  Patient was seen and evaluated during dialysis  She continues to be oliguric due to hypotension no fluid was removed during dialysis treatment 05/25/2018..  1300 cc of stool  Blood pressure 117/80 pulse 100 220 temperature 97.8 O2 sats 90% room air  Sodium 141 potassium 3.8 chloride 103 CO2 23 BUN 81 creatinine 4.8 glucose 178 calcium 8.9 phosphorus 9.4 albumin 2.0 WBC 15.3 hemoglobin 9.3 platelets 269  Atorvastatin 80 mg daily insulin sliding scale Protonix 40 mg IV daily, Lopressor 5 mg every 8 hours IV, loperamide 4 mg 3 times daily   Objective:  Vital signs in last 24 hours:  Temp:  [97.8 F (36.6 C)-98.4 F (36.9 C)] 97.8 F (36.6 C) (03/12 0305) Pulse Rate:  [102-116] 116 (03/11 2014) Resp:  [17-27] 17 (03/12 0500) BP: (90-123)/(56-84) 122/84 (03/12 0500) SpO2:  [95 %-100 %] 95 % (03/12 0305) Weight:  [58.1 kg] 58.1 kg (03/12 0500)  Weight change: -4.94 kg Filed Weights   05/23/18 1720 05/24/18 0444 05/25/18 0500  Weight: 62.5 kg 62.1 kg 58.1 kg    Intake/Output: I/O last 3 completed shifts: In: 2040 [I.V.:40; NG/GT:2000] Out: 2325 [Urine:50; Drains:50; Stool:2225]   Intake/Output this shift:  Total I/O In: 10 [I.V.:10] Out: -   CVS- RRR regular rate and rhythm RS- CTA no wheezes or rales ABD- BS present soft non-distended ex lap incision site clean dry dressing colostomy EXT-temporary dialysis catheter left IJ   Basic Metabolic Panel: Recent Labs  Lab 05/19/18 0446 05/19/18 0641  05/20/18 1611 05/21/18 0449 05/22/18 0413 05/23/18 1439 05/24/18 0833 05/25/18 0714  NA 135   --    < > 140 141 145 149* 141 141  K 6.1*  --    < > 3.5 3.6 4.0 4.7 3.7 3.8  CL 105  --    < > 111 114* 118* 122* 103 103  CO2 19*  --    < > 22 18* 19* 15* 25 23  GLUCOSE 226*  --    < > 148* 170* 191* 155* 169* 178*  BUN 43*  --    < > 48* 61* 87* 115* 49* 81*  CREATININE 2.50*  --    < > 2.73* 3.18* 4.38* 5.38* 3.29* 4.80*  CALCIUM 7.6*  --    < > 7.9* 7.7* 8.1* 8.6* 8.4* 8.9  MG 2.2 2.2  --   --   --   --   --  1.7  --   PHOS 8.1*  --    < > 2.8 3.5 5.3* 7.9*  --  9.4*   < > = values in this interval not displayed.    Liver Function Tests: Recent Labs  Lab 05/20/18 1611 05/21/18 0449 05/22/18 0413 05/23/18 1439 05/25/18 0714  ALBUMIN 1.7* 1.6* 1.7* 1.9* 2.0*   No results for input(s): LIPASE, AMYLASE in the last 168 hours. No results for input(s): AMMONIA in the last 168 hours.  CBC: Recent Labs  Lab 05/19/18 1352 05/22/18 0414 05/23/18 1439 05/24/18 0833 05/25/18 0714  WBC 11.9* 13.1* 15.1* 16.2* 15.3*  HGB 9.2* 8.5* 8.8* 9.0* 9.3*  HCT 27.4* 27.1* 27.3* 27.8* 27.8*  MCV 91.9 96.1 97.5 93.3 93.3  PLT  106* 197 268 245 269    Cardiac Enzymes: No results for input(s): CKTOTAL, CKMB, CKMBINDEX, TROPONINI in the last 168 hours.  BNP: Invalid input(s): POCBNP  CBG: Recent Labs  Lab 05/24/18 1149 05/24/18 1620 05/24/18 2051 05/24/18 2357 05/25/18 0517  GLUCAP 164* 134* 150* 168* 168*    Microbiology: Results for orders placed or performed during the hospital encounter of 04/21/2018  Culture, blood (Routine X 2) w Reflex to ID Panel     Status: None   Collection Time: 05/12/18  3:00 AM  Result Value Ref Range Status   Specimen Description BLOOD LEFT ARM  Final   Special Requests   Final    BOTTLES DRAWN AEROBIC AND ANAEROBIC Blood Culture adequate volume   Culture   Final    NO GROWTH 5 DAYS Performed at [facility], [address], [place], [place] [address]    Report Status 05/17/2018 FINAL  Final  Culture, blood (Routine X 2) w Reflex to  ID Panel     Status: None   Collection Time: 05/12/18  3:20 AM  Result Value Ref Range Status   Specimen Description BLOOD LEFT HAND  Final   Special Requests   Final    BOTTLES DRAWN AEROBIC ONLY Blood Culture adequate volume   Culture   Final    NO GROWTH 5 DAYS Performed at [facility], [address] [place], [place] [address]    Report Status 05/17/2018 FINAL  Final    Coagulation Studies: No results for input(s): LABPROT, INR in the last 72 hours.  Urinalysis: No results for input(s): COLORURINE, LABSPEC, PHURINE, GLUCOSEU, HGBUR, BILIRUBINUR, KETONESUR, PROTEINUR, UROBILINOGEN, NITRITE, LEUKOCYTESUR in the last 72 hours.  Invalid input(s): APPERANCEUR    Imaging: Dg Swallowing Func-speech Pathology  Result Date: 05/24/2018 Objective Swallowing Evaluation: Type of Study: MBS-Modified Barium Swallow Study  Patient Details Name: Gwendolyn Pham MRN: [id number] Date of Birth: 1955-08-06 Today's Date: 05/24/2018 Time: SLP Start Time (ACUTE ONLY): 1512 -SLP Stop Time (ACUTE ONLY): 1529 SLP Time Calculation (min) (ACUTE ONLY): 17 min Past Medical History: Past Medical History: Diagnosis Date . Anxiety  . Asthma  . COPD (chronic obstructive pulmonary disease) (HCC)  . Depression  . Diabetes mellitus without complication (HCC)  . Hyperlipidemia  . Hypertension  Past Surgical History: Past Surgical History: Procedure Laterality Date . AORTA - BILATERAL FEMORAL ARTERY BYPASS GRAFT N/A 04/22/2018  Procedure: AORTA BIFEMORAL BYPASS GRAFT;  Surgeon: Larina Earthly, MD;  Location: [facility] OR;  Service: Vascular;  Laterality: N/A; . BOWEL RESECTION  04/18/2018  Procedure: Resection Small Bowel, LEFT AND RIGHT COLON;  Surgeon: Abigail Miyamoto, MD;  Location: [facility] OR;  Service: General;; . COLOSTOMY N/A 05/06/2018  Procedure: COLOSTOMY;  Surgeon: Violeta Gelinas, MD;  Location: [facility] OR;  Service: General;  Laterality: N/A; . DILATION AND CURETTAGE OF UTERUS   . INNER EAR SURGERY   . LAPAROTOMY N/A 04/24/2018   Procedure: EMERGENCY EXPLORATORY LAPAROTOMY;  Surgeon: Cephus Shelling, MD;  Location: [facility] OR;  Service: Vascular;  Laterality: N/A; . LAPAROTOMY N/A 04/22/2018  Procedure: EXPLORATORY LAPAROTOMY ILEOCOLONIC ANASTOMOS CLOSURE OF ABDOMEN;  Surgeon: Violeta Gelinas, MD;  Location: [facility] OR;  Service: General;  Laterality: N/A; HPI: Pt is a 63 y.o. female who was admitted 2/26 for aorto femoral bypass.  2/27 had hypotension and respiratory distress.  Intubated that night, fould to be in septic shock, underwent emergent exploratory laparotomy, small bowel resection, bilateral colectomy. Extubated on 3/5 in am.  PMH: HTN,  HLD, DM, depression, COPD, asthma, anxiety  Subjective: pt is alert but very distractible Assessment / Plan / Recommendation CHL IP CLINICAL IMPRESSIONS 05/24/2018 Clinical Impression Pt has a moderate oropharyngeal dysphagia due to cognitive impairments and generalized weakness. Swallow study was initiated in a semi-reclined position as pt was having difficulty tolerating an upright posture. She had poor bolus awareness and weak lingual propulsion, with subsequent reduced bolus cohesion, delayed transit, and premature spillage. Thin liquids were aspirated into the airway before the swallow; nectar thick liquids were only penetrated, and in trace amounts. Part way through the study pt was able to be repositioned into a higher, seated position, which resulted in no further airway compromise with nectar thick liquids. Thin liquids were still aspirated before the swallow. Although no aspiration was observed with nectar thick liquids or purees, she did have mild residue at the valleculae primarily due to reduced base of tongue retraction. Suspect pt would have improved pharyngeal clearance without cortrak present, but I'm not sure she could nutritionally support herself yet given her mentation. Would continue nutrition primarily via alternative means, but will initiate therapeutic trials of nectar thick  liquids and purees to facilitate return to PO diet of these textures.  SLP Visit Diagnosis Dysphagia, oropharyngeal phase (R13.12) Attention and concentration deficit following -- Frontal lobe and executive function deficit following -- Impact on safety and function Moderate aspiration risk   CHL IP TREATMENT RECOMMENDATION 05/24/2018 Treatment Recommendations Therapy as outlined in treatment plan below   Prognosis 05/24/2018 Prognosis for Safe Diet Advancement Good Barriers to Reach Goals Cognitive deficits Barriers/Prognosis Comment -- CHL IP DIET RECOMMENDATION 05/24/2018 SLP Diet Recommendations Alternative means - temporary;NPO Liquid Administration via -- Medication Administration Via alternative means Compensations -- Postural Changes --   CHL IP OTHER RECOMMENDATIONS 05/24/2018 Recommended Consults -- Oral Care Recommendations Oral care QID Other Recommendations --   CHL IP FOLLOW UP RECOMMENDATIONS 05/24/2018 Follow up Recommendations Inpatient Rehab   CHL IP FREQUENCY AND DURATION 05/24/2018 Speech Therapy Frequency (ACUTE ONLY) min 2x/week Treatment Duration 2 weeks      CHL IP ORAL PHASE 05/24/2018 Oral Phase Impaired Oral - Pudding Teaspoon -- Oral - Pudding Cup -- Oral - Honey Teaspoon -- Oral - Honey Cup -- Oral - Nectar Teaspoon -- Oral - Nectar Cup -- Oral - Nectar Straw Decreased bolus cohesion;Delayed oral transit;Premature spillage Oral - Thin Teaspoon -- Oral - Thin Cup -- Oral - Thin Straw Decreased bolus cohesion;Delayed oral transit;Premature spillage Oral - Puree Decreased bolus cohesion;Delayed oral transit Oral - Mech Soft -- Oral - Regular -- Oral - Multi-Consistency -- Oral - Pill -- Oral Phase - Comment --  CHL IP PHARYNGEAL PHASE 05/24/2018 Pharyngeal Phase Impaired Pharyngeal- Pudding Teaspoon -- Pharyngeal -- Pharyngeal- Pudding Cup -- Pharyngeal -- Pharyngeal- Honey Teaspoon -- Pharyngeal -- Pharyngeal- Honey Cup -- Pharyngeal -- Pharyngeal- Nectar Teaspoon -- Pharyngeal -- Pharyngeal-  Nectar Cup -- Pharyngeal -- Pharyngeal- Nectar Straw Reduced pharyngeal peristalsis;Reduced tongue base retraction;Pharyngeal residue - valleculae;Penetration/Aspiration before swallow Pharyngeal Material enters airway, remains ABOVE vocal cords and not ejected out Pharyngeal- Thin Teaspoon -- Pharyngeal -- Pharyngeal- Thin Cup -- Pharyngeal -- Pharyngeal- Thin Straw Reduced pharyngeal peristalsis;Reduced tongue base retraction;Pharyngeal residue - valleculae;Penetration/Aspiration before swallow Pharyngeal Material enters airway, passes BELOW cords and not ejected out despite cough attempt by patient Pharyngeal- Puree Reduced pharyngeal peristalsis;Reduced tongue base retraction;Pharyngeal residue - valleculae Pharyngeal -- Pharyngeal- Mechanical Soft -- Pharyngeal -- Pharyngeal- Regular -- Pharyngeal -- Pharyngeal- Multi-consistency -- Pharyngeal -- Pharyngeal- Pill -- Pharyngeal --  Pharyngeal Comment --  CHL IP CERVICAL ESOPHAGEAL PHASE 05/24/2018 Cervical Esophageal Phase Impaired Pudding Teaspoon -- Pudding Cup -- Honey Teaspoon -- Honey Cup -- Nectar Teaspoon -- Nectar Cup -- Nectar Straw Reduced cricopharyngeal relaxation Thin Teaspoon -- Thin Cup -- Thin Straw Reduced cricopharyngeal relaxation Puree Reduced cricopharyngeal relaxation Mechanical Soft -- Regular -- Multi-consistency -- Pill -- Cervical Esophageal Comment -- Kwanna Freehill Nix 05/24/2018, 4:41 PM  Ivar Drape, M.A. CCC-SLP Acute Rehabilitation Services Pager [phone] Office [phone]               Medications:   . sodium chloride Stopped (05/24/18 2300)  . sodium chloride    . sodium chloride    . feeding supplement (VITAL AF 1.2 CAL) 1,000 mL (05/25/18 0507)   . atorvastatin  80 mg Per Tube Daily  . Chlorhexidine Gluconate Cloth  6 each Topical Daily  . Chlorhexidine Gluconate Cloth  6 each Topical Q0600  . feeding supplement (PRO-STAT SUGAR FREE 64)  30 mL Per Tube TID  . Gerhardt's butt cream   Topical BID  . insulin aspart   0-9 Units Subcutaneous Q4H  . loperamide HCl  4 mg Per Tube TID  . mouth rinse  15 mL Mouth Rinse BID  . metoprolol tartrate  5 mg Intravenous Q8H  . pantoprazole  40 mg Oral Daily  . sodium chloride flush  10-40 mL Intracatheter Q12H   sodium chloride, sodium chloride, sodium chloride, acetaminophen **OR** acetaminophen, alteplase, bisacodyl, diphenoxylate-atropine, fentaNYL (SUBLIMAZE) injection, heparin, labetalol, ondansetron, phenol, sodium chloride flush  Assessment/ Plan:   Oliguric acute kidney injury presumably secondary to acute tubular necrosis from hypotension and possibly sepsis necrotic bowel status post exploratory laparotomy with colostomy placement.  She was on CRRT from Jun 06, 202022 to 05/18/2018 with a temporary dialysis catheter placed in the left IJ.  Patient was seen during dialysis 05/25/2018 complicated by hypotension and shock.  Symptoms resolved no fluid removed during dialysis treatment continue to avoid nephrotoxin agents and also IV contrast.  Does not appear to be return of renal function at this point.  Continue to follow urine output and renal panel on a daily basis.  She is oliguric  Acute respiratory failure mechanical ventilation followed by pulmonology and critical care patient now extubated and stable  History of septic shock due to ischemic and necrotic bowel now off pressors  Peripheral artery disease status post aorto bifemoral bypass 04/20/2018  Ischemic bowel status post small bowel resection and right and left colectomy with colostomy  History of thrombocytopenia no heparin with dialysis  High output through ostomy.  Continue to follow may need stool bulking agents will defer to general surgery    LOS: 15 Garnetta Buddy @TODAY @11 :39 AM

## 2018-05-26 ENCOUNTER — Inpatient Hospital Stay (HOSPITAL_COMMUNITY): Payer: Medicare Other

## 2018-05-26 DIAGNOSIS — R4182 Altered mental status, unspecified: Secondary | ICD-10-CM

## 2018-05-26 DIAGNOSIS — Z9889 Other specified postprocedural states: Secondary | ICD-10-CM

## 2018-05-26 LAB — AMMONIA: Ammonia: 32 umol/L (ref 9–35)

## 2018-05-26 LAB — GLUCOSE, CAPILLARY
Glucose-Capillary: 163 mg/dL — ABNORMAL HIGH (ref 70–99)
Glucose-Capillary: 167 mg/dL — ABNORMAL HIGH (ref 70–99)
Glucose-Capillary: 169 mg/dL — ABNORMAL HIGH (ref 70–99)
Glucose-Capillary: 172 mg/dL — ABNORMAL HIGH (ref 70–99)
Glucose-Capillary: 179 mg/dL — ABNORMAL HIGH (ref 70–99)

## 2018-05-26 LAB — TSH: TSH: 0.999 u[IU]/mL (ref 0.350–4.500)

## 2018-05-26 MED ORDER — DIPHENOXYLATE-ATROPINE 2.5-0.025 MG/5ML PO LIQD
5.0000 mL | Freq: Three times a day (TID) | ORAL | Status: DC
  Administered 2018-05-26 – 2018-05-30 (×14): 5 mL
  Filled 2018-05-26 (×13): qty 5

## 2018-05-26 MED ORDER — CHLORHEXIDINE GLUCONATE CLOTH 2 % EX PADS: 6.0000 | MEDICATED_PAD | Freq: Every day | CUTANEOUS | Status: DC

## 2018-05-26 NOTE — Progress Notes (Addendum)
13 Days Post-Op  Subjective: CC:  Continued confusion this morning. Complains of pain.   Objective: Vital signs in last 24 hours: Temp:  [97.5 F (36.4 C)-98.5 F (36.9 C)] 97.5 F (36.4 C) (03/13 0303) Pulse Rate:  [34-119] 52 (03/13 0303) Resp:  [14-20] 16 (03/13 0530) BP: (91-137)/(58-88) 136/88 (03/13 0530) SpO2:  [95 %-100 %] 98 % (03/13 0303) Weight:  [58.5 kg] 58.5 kg (03/13 0426) Last BM Date: 05/25/18  Intake/Output from previous day: 03/12 0701 - 03/13 0700 In: 1460 [P.O.:60; I.V.:10; NG/GT:1390] Out: 1434 [Urine:50; Stool:1475] Intake/Output this shift: No intake/output data recorded.  PE: Gen: frail, ill appearing, NAD, awake and alert Card: Tachycardic Lungs: Normal effort Abd: Soft, non-distended, +BS, stomabudded and viable with os at 6 o'clock.The colostomy bag is now position vertically with a belt in place. No leakage. Prior irritation (as below) seems to have been improved. No signs of cellulitis. Colostomy bag was recently changed. Present gas without stool in bag.1475cc output recorded overnight.Midline wound vac in place. Abdomen is soft without obvious tenderness. No peritonitis.  Msk: no edema, DP intake b/l Skin: warm and dry  Picture from 3/11. Some irritation without signs of cellulitis. This has greatly improved since that time.      Lab Results:  Recent Labs    05/24/18 0833 05/25/18 0714  WBC 16.2* 15.3*  HGB 9.0* 9.3*  HCT 27.8* 27.8*  PLT 245 269   BMET Recent Labs    05/24/18 0833 05/25/18 0714  NA 141 141  K 3.7 3.8  CL 103 103  CO2 25 23  GLUCOSE 169* 178*  BUN 49* 81*  CREATININE 3.29* 4.80*  CALCIUM 8.4* 8.9   PT/INR No results for input(s): LABPROT, INR in the last 72 hours. CMP     Component Value Date/Time   NA 141 05/25/2018 0714   NA 137 01/23/2018 1218   K 3.8 05/25/2018 0714   CL 103 05/25/2018 0714   CO2 23 05/25/2018 0714   GLUCOSE 178 (H) 05/25/2018 0714   BUN 81 (H) 05/25/2018 0714   BUN 11 01/23/2018 1218   CREATININE 4.80 (H) 05/25/2018 0714   CREATININE 0.66 06/18/2015 1855   CALCIUM 8.9 05/25/2018 0714   PROT 5.5 (L) 05/18/2018 0356   PROT 7.2 01/23/2018 1218   ALBUMIN 2.0 (L) 05/25/2018 0714   ALBUMIN 4.3 01/23/2018 1218   AST 222 (H) 05/18/2018 0356   ALT 172 (H) 05/18/2018 0356   ALKPHOS 154 (H) 05/18/2018 0356   BILITOT 2.0 (H) 05/18/2018 0356   BILITOT 0.2 01/23/2018 1218   GFRNONAA 9 (L) 05/25/2018 0714   GFRNONAA >89 01/31/2014 1532   GFRAA 10 (L) 05/25/2018 0714   GFRAA >89 01/31/2014 1532   Lipase  No results found for: LIPASE     Studies/Results: Dg Chest Port 1 View  Result Date: 05/25/2018 CLINICAL DATA:  Shortness of breath EXAM: PORTABLE CHEST 1 VIEW COMPARISON:  05/18/2018 FINDINGS: Cardiac shadow is stable. Feeding catheter is noted within the stomach. Left jugular central line extends into the proximal SVC. The lungs are well aerated bilaterally. No focal infiltrate or sizable effusion is seen. No bony abnormality is noted. IMPRESSION: Resolution of previously seen right basilar changes. Tubes and lines as described. Electronically Signed   By: Alcide Clever M.D.   On: 05/25/2018 14:20   Dg Swallowing Func-speech Pathology  Result Date: 05/24/2018 Objective Swallowing Evaluation: Type of Study: MBS-Modified Barium Swallow Study  Patient Details Name: Gwendolyn Pham MRN: [id number] Date  of Birth: March 13, 1956 Today's Date: 05/24/2018 Time: SLP Start Time (ACUTE ONLY): 1512 -SLP Stop Time (ACUTE ONLY): 1529 SLP Time Calculation (min) (ACUTE ONLY): 17 min Past Medical History: Past Medical History: Diagnosis Date . Anxiety  . Asthma  . COPD (chronic obstructive pulmonary disease) (HCC)  . Depression  . Diabetes mellitus without complication (HCC)  . Hyperlipidemia  . Hypertension  Past Surgical History: Past Surgical History: Procedure Laterality Date . AORTA - BILATERAL FEMORAL ARTERY BYPASS GRAFT N/A May 16, 2018  Procedure: AORTA BIFEMORAL BYPASS GRAFT;   Surgeon: Larina Earthly, MD;  Location: [facility] OR;  Service: Vascular;  Laterality: N/A; . BOWEL RESECTION  04/15/2018  Procedure: Resection Small Bowel, LEFT AND RIGHT COLON;  Surgeon: Abigail Miyamoto, MD;  Location: [facility] OR;  Service: General;; . COLOSTOMY N/A 05/05/2018  Procedure: COLOSTOMY;  Surgeon: Violeta Gelinas, MD;  Location: [facility] OR;  Service: General;  Laterality: N/A; . DILATION AND CURETTAGE OF UTERUS   . INNER EAR SURGERY   . LAPAROTOMY N/A 05/12/2018  Procedure: EMERGENCY EXPLORATORY LAPAROTOMY;  Surgeon: Cephus Shelling, MD;  Location: [facility] OR;  Service: Vascular;  Laterality: N/A; . LAPAROTOMY N/A 04/18/2018  Procedure: EXPLORATORY LAPAROTOMY ILEOCOLONIC ANASTOMOS CLOSURE OF ABDOMEN;  Surgeon: Violeta Gelinas, MD;  Location: [facility] OR;  Service: General;  Laterality: N/A; HPI: Pt is a 63 y.o. female who was admitted 2/26 for aorto femoral bypass.  2/27 had hypotension and respiratory distress.  Intubated that night, fould to be in septic shock, underwent emergent exploratory laparotomy, small bowel resection, bilateral colectomy. Extubated on 3/5 in am.  PMH: HTN, HLD, DM, depression, COPD, asthma, anxiety  Subjective: pt is alert but very distractible Assessment / Plan / Recommendation CHL IP CLINICAL IMPRESSIONS 05/24/2018 Clinical Impression Pt has a moderate oropharyngeal dysphagia due to cognitive impairments and generalized weakness. Swallow study was initiated in a semi-reclined position as pt was having difficulty tolerating an upright posture. She had poor bolus awareness and weak lingual propulsion, with subsequent reduced bolus cohesion, delayed transit, and premature spillage. Thin liquids were aspirated into the airway before the swallow; nectar thick liquids were only penetrated, and in trace amounts. Part way through the study pt was able to be repositioned into a higher, seated position, which resulted in no further airway compromise with nectar thick liquids. Thin liquids were still aspirated  before the swallow. Although no aspiration was observed with nectar thick liquids or purees, she did have mild residue at the valleculae primarily due to reduced base of tongue retraction. Suspect pt would have improved pharyngeal clearance without cortrak present, but I'm not sure she could nutritionally support herself yet given her mentation. Would continue nutrition primarily via alternative means, but will initiate therapeutic trials of nectar thick liquids and purees to facilitate return to PO diet of these textures.  SLP Visit Diagnosis Dysphagia, oropharyngeal phase (R13.12) Attention and concentration deficit following -- Frontal lobe and executive function deficit following -- Impact on safety and function Moderate aspiration risk   CHL IP TREATMENT RECOMMENDATION 05/24/2018 Treatment Recommendations Therapy as outlined in treatment plan below   Prognosis 05/24/2018 Prognosis for Safe Diet Advancement Good Barriers to Reach Goals Cognitive deficits Barriers/Prognosis Comment -- CHL IP DIET RECOMMENDATION 05/24/2018 SLP Diet Recommendations Alternative means - temporary;NPO Liquid Administration via -- Medication Administration Via alternative means Compensations -- Postural Changes --   CHL IP OTHER RECOMMENDATIONS 05/24/2018 Recommended Consults -- Oral Care Recommendations Oral care QID Other Recommendations --   CHL IP FOLLOW UP RECOMMENDATIONS 05/24/2018 Follow up Recommendations  Inpatient Rehab   CHL IP FREQUENCY AND DURATION 05/24/2018 Speech Therapy Frequency (ACUTE ONLY) min 2x/week Treatment Duration 2 weeks      CHL IP ORAL PHASE 05/24/2018 Oral Phase Impaired Oral - Pudding Teaspoon -- Oral - Pudding Cup -- Oral - Honey Teaspoon -- Oral - Honey Cup -- Oral - Nectar Teaspoon -- Oral - Nectar Cup -- Oral - Nectar Straw Decreased bolus cohesion;Delayed oral transit;Premature spillage Oral - Thin Teaspoon -- Oral - Thin Cup -- Oral - Thin Straw Decreased bolus cohesion;Delayed oral transit;Premature  spillage Oral - Puree Decreased bolus cohesion;Delayed oral transit Oral - Mech Soft -- Oral - Regular -- Oral - Multi-Consistency -- Oral - Pill -- Oral Phase - Comment --  CHL IP PHARYNGEAL PHASE 05/24/2018 Pharyngeal Phase Impaired Pharyngeal- Pudding Teaspoon -- Pharyngeal -- Pharyngeal- Pudding Cup -- Pharyngeal -- Pharyngeal- Honey Teaspoon -- Pharyngeal -- Pharyngeal- Honey Cup -- Pharyngeal -- Pharyngeal- Nectar Teaspoon -- Pharyngeal -- Pharyngeal- Nectar Cup -- Pharyngeal -- Pharyngeal- Nectar Straw Reduced pharyngeal peristalsis;Reduced tongue base retraction;Pharyngeal residue - valleculae;Penetration/Aspiration before swallow Pharyngeal Material enters airway, remains ABOVE vocal cords and not ejected out Pharyngeal- Thin Teaspoon -- Pharyngeal -- Pharyngeal- Thin Cup -- Pharyngeal -- Pharyngeal- Thin Straw Reduced pharyngeal peristalsis;Reduced tongue base retraction;Pharyngeal residue - valleculae;Penetration/Aspiration before swallow Pharyngeal Material enters airway, passes BELOW cords and not ejected out despite cough attempt by patient Pharyngeal- Puree Reduced pharyngeal peristalsis;Reduced tongue base retraction;Pharyngeal residue - valleculae Pharyngeal -- Pharyngeal- Mechanical Soft -- Pharyngeal -- Pharyngeal- Regular -- Pharyngeal -- Pharyngeal- Multi-consistency -- Pharyngeal -- Pharyngeal- Pill -- Pharyngeal -- Pharyngeal Comment --  CHL IP CERVICAL ESOPHAGEAL PHASE 05/24/2018 Cervical Esophageal Phase Impaired Pudding Teaspoon -- Pudding Cup -- Honey Teaspoon -- Honey Cup -- Nectar Teaspoon -- Nectar Cup -- Nectar Straw Reduced cricopharyngeal relaxation Thin Teaspoon -- Thin Cup -- Thin Straw Reduced cricopharyngeal relaxation Puree Reduced cricopharyngeal relaxation Mechanical Soft -- Regular -- Multi-consistency -- Pill -- Cervical Esophageal Comment -- Emary Zalar Nix 05/24/2018, 4:41 PM  Ivar Drape, M.A. CCC-SLP Acute Rehabilitation Services Pager [phone] Office [phone]               Anti-infectives: Anti-infectives (From admission, onward)   Start     Dose/Rate Route Frequency Ordered Stop   05/19/18 0900  meropenem (MERREM) 500 mg in sodium chloride 0.9 % 100 mL IVPB     500 mg 200 mL/hr over 30 Minutes Intravenous  Once 05/18/18 0956 05/19/18 0825   05/17/18 1130  vancomycin (VANCOCIN) IVPB 750 mg/150 ml premix  Status:  Discontinued     750 mg 150 mL/hr over 60 Minutes Intravenous Every 24 hours 05/17/18 0733 05/17/18 0919   05/16/18 0900  vancomycin (VANCOCIN) IVPB 750 mg/150 ml premix  Status:  Discontinued     750 mg 150 mL/hr over 60 Minutes Intravenous Every 24 hours 05/16/18 0745 05/17/18 0733   05/15/18 0800  vancomycin (VANCOCIN) IVPB 750 mg/150 ml premix  Status:  Discontinued     750 mg 150 mL/hr over 60 Minutes Intravenous Every 24 hours 05/14/18 0916 05/16/18 0745   04/28/2018 2200  meropenem (MERREM) 1 g in sodium chloride 0.9 % 100 mL IVPB  Status:  Discontinued     1 g 200 mL/hr over 30 Minutes Intravenous Every 12 hours 05/08/2018 1714 05/18/18 0956   05/12/18 1115  vancomycin (VANCOCIN) 1,250 mg in sodium chloride 0.9 % 250 mL IVPB     1,250 mg 166.7 mL/hr over 90 Minutes Intravenous  Once 05/12/18 1109 05/12/18  1348   05/12/18 1115  meropenem (MERREM) 500 mg in sodium chloride 0.9 % 100 mL IVPB  Status:  Discontinued     500 mg 200 mL/hr over 30 Minutes Intravenous Every 12 hours 05/12/18 1109 04/29/2018 1714   05/12/18 1109  vancomycin variable dose per unstable renal function (pharmacist dosing)  Status:  Discontinued      Does not apply See admin instructions 05/12/18 1109 05/14/18 0900   04/26/2018 1800  vancomycin (VANCOCIN) IVPB 1000 mg/200 mL premix     1,000 mg 200 mL/hr over 60 Minutes Intravenous Every 12 hours 05/07/2018 1357 05/20/18 1759   05/09/2018 0540  vancomycin (VANCOCIN) IVPB 1000 mg/200 mL premix     1,000 mg 200 mL/hr over 60 Minutes Intravenous 60 min pre-op 04/24/2018 0540 05/07/2018 1504       Assessment/Plan  s/p  Procedure(s): EXPLORATORY LAPAROTOMY ILEOCOLONIC ANASTOMOS CLOSURE OF ABDOMEN (N/A) COLOSTOMY (N/A)  AIOD with aortic occlusion - S/PAortobifemoral bypass wth 14 x 8 Hemashield graft, 04/24/2018, Dr. Arbie Cookey  Ischemic distal small bowel, right and left colon ischemia: - S/Pex lap with right and left colon resection, small bowel resection 2/27 Magnus Ivan)- S/Pileo-transverse colon anastomosis and left end colostomy 2/29 Janee Morn) - Midline with wound vacin place -High output from colostomy. Down from 1475 in last 24 hours. Will increase Lomotil and continueImodiumTID  -Electrolytes and fluid management per nephrology. She is net +75 currently. Hadhemodialysis 3/12. Had hypotension during this.   FEN:TF's@ goal, 15ml/hr;failed swallow study with speech, keep NPO VTE: SCD's,heparin forhemodialysis JG:[id number] 03/04, Merremonce 03/06,WBCup to 15.3(3/12), monitor, afebrile Follow up:TBD   LOS: 16 days    Jacinto Halim , [facility] 05/26/2018, 9:08 AM Pager: [phone]

## 2018-05-26 NOTE — Consult Note (Signed)
.  WOC Nurse wound follow up Wound type:Surgical Measurement:20cm x 4cm x 2.8cm Wound bed:Red, moist Drainage (amount, consistency, odor) small serous Periwound:intact. Improved since Wednesday with use of periwound drape and skin barrier rings Dressing procedure/placement/frequency:one pice of black foam removed, wound cleansed and periwound prepped with skin barrier rings and pieces of drape. One piece of black foam placed into wound bed. Drape applied and dressing attached to negatice continuous pressure.  An immediate seal is achieved. Next scheduled VAC change is Monday, 05/29/2018.   WOC Nurse ostomy follow up Stoma type/location: LUQ colostomy Stomal assessment/size: 1 and 1/4 inch x 1 and 5/8 inches oval with os at 6 o'clock and at skin level.Ostomy is pink, moist Peristomal assessment: intact Treatment options for stomal/peristomal skin: skin barrier ring Output: thin, brown effluent Ostomy pouching: 1pc.convex pouch with skin barrier ring and ostomy belt Education provided: None. P{atient is confused. Enrolled patient in [place] Secure Start Discharge program: No  I am assisted in this wound and ostomy pouch change by bedside RN as patient flails her arms and legs around during procedure.  I appreciate her expertise and assistance.  Extra pouches (2) and rings (3) are at bedside.  WOC nursing team will follow, and will remain available to this patient, the nursing and medical teams.   Thanks, Ladona Mow, MSN, RN, GNP, [place]  Pager# [phone]

## 2018-05-26 NOTE — Progress Notes (Signed)
EEG Completed; Results Pending  

## 2018-05-26 NOTE — Procedures (Signed)
ELECTROENCEPHALOGRAM REPORT   Patient: Gwendolyn Pham       Room #: 9J57S EEG No. ID: [id number] Age: 63 y.o.        Sex: female Referring Physician: Early Report Date:  05/26/2018        Interpreting Physician: Thana Farr  History: Gwendolyn Pham is an 63 y.o. female with altered mental status  Medications:  Lipitor, Lomotil, Insulin, Imodium, Protonix  Conditions of Recording:  This is a 21 channel routine scalp EEG performed with bipolar and monopolar montages arranged in accordance to the international 10/20 system of electrode placement. One channel was dedicated to EKG recording.  The patient is in the awake, drowsy and asleep states.  Description:  The waking background activity consists of a low voltage, symmetrical, fairly well organized, 7 Hz theta activity at its maximum (yet often slower), seen from the parieto-occipital and posterior temporal regions.  Low voltage fast activity, poorly organized, is seen anteriorly and is at times superimposed on more posterior regions.  A mixture of theta and alpha rhythms are seen from the central and temporal regions. The patient drowses with slowing to irregular, low voltage theta and beta activity.   The patient goes in to a light sleep with symmetrical sleep spindles, vertex central sharp transients and irregular slow activity.  There are what appears to be infrequent periodic discharges of triphasic morphology seen most notably over the left hemisphere.  It should be noted though that, particularly during wakefulness when these are seen, muscle and movement artifact are prominent and this activity may be artifactual. Hyperventilation and intermittent photic stimulation were not performed.   IMPRESSION: This is an abnormal EEG secondary to posterior background slowing.  This finding may be seen with a diffuse gray matter disturbance that is etiologically nonspecific, but may include a dementia or encephalopathy, among other possibilities.   There is question of triphasic waves, most prominent on the left, versus artifact.  Clinical correlation recommended.    Thana Farr, MD Neurology [phone] 05/26/2018, 3:09 PM

## 2018-05-26 NOTE — Progress Notes (Addendum)
  Progress Note    05/26/2018 7:53 AM 13 Days Post-Op  Subjective:  Somewhat confused this morning   Vitals:   05/26/18 0303 05/26/18 0530  BP: 128/65 136/88  Pulse: (!) 52   Resp: 14 16  Temp: (!) 97.5 F (36.4 C)   SpO2: 98%    Physical Exam: Lungs:  Non labored Incisions:  Groin incisions healing well; abd incision with wound vac Extremities:  Feet symmetrically warm to touch with good cap refill Abdomen:  Wound vac in place, good seal; ostomy appears clean Neurologic: confused  CBC    Component Value Date/Time   WBC 15.3 (H) 05/25/2018 0714   RBC 2.98 (L) 05/25/2018 0714   HGB 9.3 (L) 05/25/2018 0714   HGB 15.8 01/23/2018 1218   HCT 27.8 (L) 05/25/2018 0714   HCT 45.9 01/23/2018 1218   PLT 269 05/25/2018 0714   PLT 253 01/23/2018 1218   MCV 93.3 05/25/2018 0714   MCV 93 01/23/2018 1218   MCH 31.2 05/25/2018 0714   MCHC 33.5 05/25/2018 0714   RDW 15.3 05/25/2018 0714   RDW 13.7 01/23/2018 1218   LYMPHSABS 1.1 04/15/2018 1802   LYMPHSABS 2.8 01/23/2018 1218   MONOABS 0.3 05/10/2018 1802   EOSABS 0.0 04/21/2018 1802   EOSABS 0.1 01/23/2018 1218   BASOSABS 0.0 05/04/2018 1802   BASOSABS 0.1 01/23/2018 1218    BMET    Component Value Date/Time   NA 141 05/25/2018 0714   NA 137 01/23/2018 1218   K 3.8 05/25/2018 0714   CL 103 05/25/2018 0714   CO2 23 05/25/2018 0714   GLUCOSE 178 (H) 05/25/2018 0714   BUN 81 (H) 05/25/2018 0714   BUN 11 01/23/2018 1218   CREATININE 4.80 (H) 05/25/2018 0714   CREATININE 0.66 06/18/2015 1855   CALCIUM 8.9 05/25/2018 0714   GFRNONAA 9 (L) 05/25/2018 0714   GFRNONAA >89 01/31/2014 1532   GFRAA 10 (L) 05/25/2018 0714   GFRAA >89 01/31/2014 1532    INR    Component Value Date/Time   INR 1.4 (H) 05/12/2018 0415     Intake/Output Summary (Last 24 hours) at 05/26/2018 0753 Last data filed at 05/26/2018 0600 Gross per 24 hour  Intake 1460 ml  Output 1434 ml  Net 26 ml     Assessment/Plan:  63 y.o. female is  s/p status post aortobifemoral bypass postop atheroemboli complication with infarction of small/large bowel and renal failure. 13 Days Post-Op   Feet warm and well perfused Groin incisions healed Wound vac abd with good seal; Surgery following wound Failed swallow study; continue NG feeds ESRD per Nephrology; treatment ended Eevie Lapp yesterday due to hypotension Encouraged participation with therapy teams Dispo pending the above and therapy recommendations    Emilie Rutter, PA-C [facility] [phone] 05/26/2018 7:53 AM  I have examined the patient, reviewed and agree with above.  Alert.  Still confused.  Plan for hemodialysis again tomorrow.  Hypotensive yesterday on dialysis.  Gretta Began, MD 05/26/2018 1:23 PM

## 2018-05-26 NOTE — Progress Notes (Signed)
  Speech Language Pathology Treatment: Dysphagia  Patient Details Name: Gwendolyn Pham MRN: [id number] DOB: Dec 05, 1955 Today's Date: 05/26/2018 Time: 9432-7614 SLP Time Calculation (min) (ACUTE ONLY): 20 min  Assessment / Plan / Recommendation Clinical Impression  Pt was alert and cooperative, but notably confused throughout dysphagia treatment session this morning. Pt was agreeable to ST's request to reposition; she tolerated and maintained bed elevation between 35-40 degrees throughout session without any complaints of discomfort. Provided Mod verbal/visual/tacile cues, pt accepted ~25 teaspoon/cup sips of nectar thick liquid with 2 immediate cough responses (within first 10 trials). Pt also consumed ~10 trials of puree without overt s/s aspiration. Of note, pt exhibited intermittent wet vocal quality and poor bolus awareness throughout trials; she often begins to talk before swallow, requiring verbal cues for swallow initiation. Given pt's level of confusion and mod-severe risk for aspiration, recommend continue NPO with alternative means of nutrition and medication administration for now. ST will continue to follow acutely to provide PO trials and further assess readiness for diet advancement.    HPI HPI: Pt is a 63 y.o. female who was admitted 2/26 for aorto femoral bypass. 2/27 had hypotension and respiratory distress. Intubated that night, fould to be in septic shock, underwent emergent exploratory laparotomy, small bowel resection, bilateral colectomy. Extubated on 3/5 in am. PMH: HTN, HLD, DM, depression, COPD, asthma, anxiety.  New CXR 3/12 following MBSS with results pending      SLP Plan  Continue with current plan of care       Recommendations  Diet recommendations: NPO Medication Administration: Via alternative means                Oral Care Recommendations: Oral care QID Follow up Recommendations: Inpatient Rehab SLP Visit Diagnosis: Dysphagia, oropharyngeal phase  (R13.12) Plan: Continue with current plan of care       Suzzette Righter, Student SLP                 Suzzette Righter 05/26/2018, 9:47 AM

## 2018-05-26 NOTE — Progress Notes (Addendum)
Subjective: Appears encephalopathic. She is awake and alert, but unable to provide a history.   Objective: Current vital signs: BP 112/67 (BP Location: Right Arm)   Pulse (!) 112   Temp 97.6 F (36.4 C) (Oral)   Resp 15   Ht  (1.626 m)   Wt 58.5 kg   SpO2 98%   BMI 22.14 kg/m  Vital signs in last 24 hours: Temp:  [97.5 F (36.4 C)-98.5 F (36.9 C)] 97.6 F (36.4 C) (03/13 0912) Pulse Rate:  [34-119] 112 (03/13 0912) Resp:  [14-20] 15 (03/13 0912) BP: (91-137)/(58-88) 112/67 (03/13 0912) SpO2:  [95 %-100 %] 98 % (03/13 0912) Weight:  [58.5 kg] 58.5 kg (03/13 0426)  Intake/Output from previous day: 03/12 0701 - 03/13 0700 In: 1460 [P.O.:60; I.V.:10; NG/GT:1390] Out: 1434 [Urine:50; Stool:1475] Intake/Output this shift: No intake/output data recorded. Nutritional status:  Diet Order            Diet NPO time specified  Diet effective now             HEENT: [place]/AT Lungs: Respirations unlabored Ext: Warm and well perfused  Neurologic Exam: Ment: Awake and alert. Oriented to self only. Most answers to questions are not relevant to the questions asked, but with intact grammar and syntax. Some answers are garbled and unintelligible. Mild dysarthria noted.  CN: PERRL. Able to perceive bilateral simultaneous stimuli in temporal visual fields. Tracks moving objects with saccadic pursuits. No forced gaze deviation or nystagmus. Face symmetric. Tongue protrudes midline.  Motor: 5/5 x 4 without asymmetry Sensory: Intact to FT x 4 Reflexes: 2+ bilateral brachioradialis and patellae. Toes equivocal.  Cerebellar: No ataxia with FNF. However, there is a coarse tremor and low amplitude asterixis.  Gait: Deferred  Lab Results: Results for orders placed or performed during the hospital encounter of 05/09/2018 (from the past 48 hour(s))  Glucose, capillary     Status: Abnormal   Collection Time: 05/24/18 11:49 AM  Result Value Ref Range   Glucose-Capillary 164 (H) 70 - 99 mg/dL    Comment 1 Notify RN    Comment 2 Document in Chart   Glucose, capillary     Status: Abnormal   Collection Time: 05/24/18  4:20 PM  Result Value Ref Range   Glucose-Capillary 134 (H) 70 - 99 mg/dL   Comment 1 Notify RN    Comment 2 Document in Chart   Glucose, capillary     Status: Abnormal   Collection Time: 05/24/18  8:51 PM  Result Value Ref Range   Glucose-Capillary 150 (H) 70 - 99 mg/dL  Glucose, capillary     Status: Abnormal   Collection Time: 05/24/18 11:57 PM  Result Value Ref Range   Glucose-Capillary 168 (H) 70 - 99 mg/dL  Glucose, capillary     Status: Abnormal   Collection Time: 05/25/18  5:17 AM  Result Value Ref Range   Glucose-Capillary 168 (H) 70 - 99 mg/dL  Renal function panel     Status: Abnormal   Collection Time: 05/25/18  7:14 AM  Result Value Ref Range   Sodium 141 135 - 145 mmol/L   Potassium 3.8 3.5 - 5.1 mmol/L   Chloride 103 98 - 111 mmol/L   CO2 23 22 - 32 mmol/L   Glucose, Bld 178 (H) 70 - 99 mg/dL   BUN 81 (H) 8 - 23 mg/dL   Creatinine, Ser 1.61 (H) 0.44 - 1.00 mg/dL   Calcium 8.9 8.9 - 09.6 mg/dL   Phosphorus 9.4 (  H) 2.5 - 4.6 mg/dL   Albumin 2.0 (L) 3.5 - 5.0 g/dL   GFR calc non Af Amer 9 (L) >60 mL/min   GFR calc Af Amer 10 (L) >60 mL/min   Anion gap 15 5 - 15    Comment: Performed at [facility], [address] [place], [place] [address]  CBC     Status: Abnormal   Collection Time: 05/25/18  7:14 AM  Result Value Ref Range   WBC 15.3 (H) 4.0 - 10.5 K/uL   RBC 2.98 (L) 3.87 - 5.11 MIL/uL   Hemoglobin 9.3 (L) 12.0 - 15.0 g/dL   HCT 62.1 (L) 30.8 - 65.7 %   MCV 93.3 80.0 - 100.0 fL   MCH 31.2 26.0 - 34.0 pg   MCHC 33.5 30.0 - 36.0 g/dL   RDW 84.6 96.2 - 95.2 %   Platelets 269 150 - 400 K/uL   nRBC 0.0 0.0 - 0.2 %    Comment: Performed at [facility], [address] [place], [place] [address]  Glucose, capillary     Status: Abnormal   Collection Time: 05/25/18  8:15 AM  Result Value Ref Range   Glucose-Capillary  183 (H) 70 - 99 mg/dL  Glucose, capillary     Status: Abnormal   Collection Time: 05/25/18 12:38 PM  Result Value Ref Range   Glucose-Capillary 141 (H) 70 - 99 mg/dL   Comment 1 Notify RN    Comment 2 Document in Chart   Glucose, capillary     Status: Abnormal   Collection Time: 05/25/18  4:29 PM  Result Value Ref Range   Glucose-Capillary 147 (H) 70 - 99 mg/dL   Comment 1 Notify RN    Comment 2 Document in Chart   Glucose, capillary     Status: Abnormal   Collection Time: 05/25/18  8:20 PM  Result Value Ref Range   Glucose-Capillary 167 (H) 70 - 99 mg/dL   Comment 1 Notify RN    Comment 2 Document in Chart   Glucose, capillary     Status: Abnormal   Collection Time: 05/25/18 11:58 PM  Result Value Ref Range   Glucose-Capillary 167 (H) 70 - 99 mg/dL   Comment 1 Notify RN    Comment 2 Document in Chart   Glucose, capillary     Status: Abnormal   Collection Time: 05/26/18  4:11 AM  Result Value Ref Range   Glucose-Capillary 172 (H) 70 - 99 mg/dL   Comment 1 Notify RN    Comment 2 Document in Chart     No results found for this or any previous visit (from the past 240 hour(s)).  Lipid Panel No results for input(s): CHOL, TRIG, HDL, CHOLHDL, VLDL, LDLCALC in the last 72 hours.  Studies/Results: Dg Chest Port 1 View  Result Date: 05/25/2018 CLINICAL DATA:  Shortness of breath EXAM: PORTABLE CHEST 1 VIEW COMPARISON:  05/18/2018 FINDINGS: Cardiac shadow is stable. Feeding catheter is noted within the stomach. Left jugular central line extends into the proximal SVC. The lungs are well aerated bilaterally. No focal infiltrate or sizable effusion is seen. No bony abnormality is noted. IMPRESSION: Resolution of previously seen right basilar changes. Tubes and lines as described. Electronically Signed   By: Alcide Clever M.D.   On: 05/25/2018 14:20   Dg Swallowing Func-speech Pathology  Result Date: 05/24/2018 Objective Swallowing Evaluation: Type of Study: MBS-Modified Barium Swallow  Study  Patient Details Name: MERTICE UFFELMAN MRN: [id number] Date  of Birth: Jul 27, 1961 Today's Date: 05/24/2018 Time: SLP Start Time (ACUTE ONLY): 1512 -SLP Stop Time (ACUTE ONLY): 1529 SLP Time Calculation (min) (ACUTE ONLY): 17 min Past Medical History: Past Medical History: Diagnosis Date . Anxiety  . Asthma  . COPD (chronic obstructive pulmonary disease) (HCC)  . Depression  . Diabetes mellitus without complication (HCC)  . Hyperlipidemia  . Hypertension  Past Surgical History: Past Surgical History: Procedure Laterality Date . AORTA - BILATERAL FEMORAL ARTERY BYPASS GRAFT N/A 05/03/2018  Procedure: AORTA BIFEMORAL BYPASS GRAFT;  Surgeon: Larina Earthly, MD;  Location: [facility] OR;  Service: Vascular;  Laterality: N/A; . BOWEL RESECTION  04/15/2018  Procedure: Resection Small Bowel, LEFT AND RIGHT COLON;  Surgeon: Abigail Miyamoto, MD;  Location: [facility] OR;  Service: General;; . COLOSTOMY N/A 04/24/2018  Procedure: COLOSTOMY;  Surgeon: Violeta Gelinas, MD;  Location: [facility] OR;  Service: General;  Laterality: N/A; . DILATION AND CURETTAGE OF UTERUS   . INNER EAR SURGERY   . LAPAROTOMY N/A 04/26/2018  Procedure: EMERGENCY EXPLORATORY LAPAROTOMY;  Surgeon: Cephus Shelling, MD;  Location: [facility] OR;  Service: Vascular;  Laterality: N/A; . LAPAROTOMY N/A 05/09/2018  Procedure: EXPLORATORY LAPAROTOMY ILEOCOLONIC ANASTOMOS CLOSURE OF ABDOMEN;  Surgeon: Violeta Gelinas, MD;  Location: [facility] OR;  Service: General;  Laterality: N/A; HPI: Pt is a 63 y.o. female who was admitted 2/26 for aorto femoral bypass.  2/27 had hypotension and respiratory distress.  Intubated that night, fould to be in septic shock, underwent emergent exploratory laparotomy, small bowel resection, bilateral colectomy. Extubated on 3/5 in am.  PMH: HTN, HLD, DM, depression, COPD, asthma, anxiety  Subjective: pt is alert but very distractible Assessment / Plan / Recommendation CHL IP CLINICAL IMPRESSIONS 05/24/2018 Clinical Impression Pt has a moderate oropharyngeal dysphagia  due to cognitive impairments and generalized weakness. Swallow study was initiated in a semi-reclined position as pt was having difficulty tolerating an upright posture. She had poor bolus awareness and weak lingual propulsion, with subsequent reduced bolus cohesion, delayed transit, and premature spillage. Thin liquids were aspirated into the airway before the swallow; nectar thick liquids were only penetrated, and in trace amounts. Part way through the study pt was able to be repositioned into a higher, seated position, which resulted in no further airway compromise with nectar thick liquids. Thin liquids were still aspirated before the swallow. Although no aspiration was observed with nectar thick liquids or purees, she did have mild residue at the valleculae primarily due to reduced base of tongue retraction. Suspect pt would have improved pharyngeal clearance without cortrak present, but I'm not sure she could nutritionally support herself yet given her mentation. Would continue nutrition primarily via alternative means, but will initiate therapeutic trials of nectar thick liquids and purees to facilitate return to PO diet of these textures.  SLP Visit Diagnosis Dysphagia, oropharyngeal phase (R13.12) Attention and concentration deficit following -- Frontal lobe and executive function deficit following -- Impact on safety and function Moderate aspiration risk   CHL IP TREATMENT RECOMMENDATION 05/24/2018 Treatment Recommendations Therapy as outlined in treatment plan below   Prognosis 05/24/2018 Prognosis for Safe Diet Advancement Good Barriers to Reach Goals Cognitive deficits Barriers/Prognosis Comment -- CHL IP DIET RECOMMENDATION 05/24/2018 SLP Diet Recommendations Alternative means - temporary;NPO Liquid Administration via -- Medication Administration Via alternative means Compensations -- Postural Changes --   CHL IP OTHER RECOMMENDATIONS 05/24/2018 Recommended Consults -- Oral Care Recommendations Oral care  QID Other Recommendations --   CHL IP FOLLOW UP RECOMMENDATIONS 05/24/2018 Follow up Recommendations  Inpatient Rehab   CHL IP FREQUENCY AND DURATION 05/24/2018 Speech Therapy Frequency (ACUTE ONLY) min 2x/week Treatment Duration 2 weeks      CHL IP ORAL PHASE 05/24/2018 Oral Phase Impaired Oral - Pudding Teaspoon -- Oral - Pudding Cup -- Oral - Honey Teaspoon -- Oral - Honey Cup -- Oral - Nectar Teaspoon -- Oral - Nectar Cup -- Oral - Nectar Straw Decreased bolus cohesion;Delayed oral transit;Premature spillage Oral - Thin Teaspoon -- Oral - Thin Cup -- Oral - Thin Straw Decreased bolus cohesion;Delayed oral transit;Premature spillage Oral - Puree Decreased bolus cohesion;Delayed oral transit Oral - Mech Soft -- Oral - Regular -- Oral - Multi-Consistency -- Oral - Pill -- Oral Phase - Comment --  CHL IP PHARYNGEAL PHASE 05/24/2018 Pharyngeal Phase Impaired Pharyngeal- Pudding Teaspoon -- Pharyngeal -- Pharyngeal- Pudding Cup -- Pharyngeal -- Pharyngeal- Honey Teaspoon -- Pharyngeal -- Pharyngeal- Honey Cup -- Pharyngeal -- Pharyngeal- Nectar Teaspoon -- Pharyngeal -- Pharyngeal- Nectar Cup -- Pharyngeal -- Pharyngeal- Nectar Straw Reduced pharyngeal peristalsis;Reduced tongue base retraction;Pharyngeal residue - valleculae;Penetration/Aspiration before swallow Pharyngeal Material enters airway, remains ABOVE vocal cords and not ejected out Pharyngeal- Thin Teaspoon -- Pharyngeal -- Pharyngeal- Thin Cup -- Pharyngeal -- Pharyngeal- Thin Straw Reduced pharyngeal peristalsis;Reduced tongue base retraction;Pharyngeal residue - valleculae;Penetration/Aspiration before swallow Pharyngeal Material enters airway, passes BELOW cords and not ejected out despite cough attempt by patient Pharyngeal- Puree Reduced pharyngeal peristalsis;Reduced tongue base retraction;Pharyngeal residue - valleculae Pharyngeal -- Pharyngeal- Mechanical Soft -- Pharyngeal -- Pharyngeal- Regular -- Pharyngeal -- Pharyngeal- Multi-consistency --  Pharyngeal -- Pharyngeal- Pill -- Pharyngeal -- Pharyngeal Comment --  CHL IP CERVICAL ESOPHAGEAL PHASE 05/24/2018 Cervical Esophageal Phase Impaired Pudding Teaspoon -- Pudding Cup -- Honey Teaspoon -- Honey Cup -- Nectar Teaspoon -- Nectar Cup -- Nectar Straw Reduced cricopharyngeal relaxation Thin Teaspoon -- Thin Cup -- Thin Straw Reduced cricopharyngeal relaxation Puree Reduced cricopharyngeal relaxation Mechanical Soft -- Regular -- Multi-consistency -- Pill -- Cervical Esophageal Comment -- Ashantia Prevett Nix 05/24/2018, 4:41 PM  Ivar Drape, M.A. CCC-SLP Acute Rehabilitation Services Pager [phone] Office [phone]              Medications:  Scheduled: . atorvastatin  80 mg Per Tube Daily  . Chlorhexidine Gluconate Cloth  6 each Topical Daily  . Chlorhexidine Gluconate Cloth  6 each Topical Q0600  . Chlorhexidine Gluconate Cloth  6 each Topical Q0600  . feeding supplement (PRO-STAT SUGAR FREE 64)  30 mL Per Tube TID  . Gerhardt's butt cream   Topical BID  . insulin aspart  0-9 Units Subcutaneous Q4H  . loperamide HCl  4 mg Per Tube TID  . mouth rinse  15 mL Mouth Rinse BID  . pantoprazole  40 mg Oral Daily  . sodium chloride flush  10-40 mL Intracatheter Q12H   Continuous: . sodium chloride Stopped (05/24/18 2300)  . sodium chloride    . sodium chloride    . feeding supplement (VITAL AF 1.2 CAL) 50 mL/hr at 05/25/18 1900     Assessment: 63 year old female with AMS 1. Overall clinical picture most consistent with combined effects of uremia and dialysis dysequilibrium syndrome.  2. Exam nonlateralizing. Findings are most consistent with a delirium. Tremor is also noted with mild asterixis, both of which are typical of a metabolic encephalopathy.  3. Elevated transaminases. A hepatic encephalopathy may also be contributing to her clinical picture.  4. Ammonia is normal.   Recommendations: 1. MRI brain 2. EEG.  3. Thiamine level, B12 level,  RPR.  4. Consider modifying dialysis  parameters to decrease the rapidity of metabolite shifts   LOS: 16 days   @Electronically  signed: Dr. Caryl PinaEric Torin Whisner@ 05/26/2018  11:10 AM  Addendum 3/14: EEG was abnormal secondary to posterior background slowing.  This finding may be seen with a diffuse gray matter disturbance that is etiologically nonspecific, but may include a dementia or encephalopathy, among other possibilities.  There is question of triphasic waves, most prominent on the left, versus artifact.    Electronically signed: Dr. Caryl PinaEric Welma Mccombs

## 2018-05-26 NOTE — Progress Notes (Signed)
Pt came to MRI was coming out of the scanner before images could be obtained.  Pt also reaching and grabbing at A-line. RN was notified and pt was sent back upstairs.

## 2018-05-26 NOTE — Progress Notes (Signed)
Inpatient Rehab Admissions Coordinator:   Inpatient Rehab Consult received.  I met with patient at the bedside for rehabilitation assessment and to discuss goals and expectations of an inpatient rehab admission.  Pt confused and unable to fully participate in assessment, but gave permission for this [facility] to speak with her sister, Vincente Liberty.  Will attempt to contact Adrienne to discuss pt's PLOF and assess for Inpatient Rehab.  Please contact me with questions.   Signed: Shann Medal, PT, DPT Admissions Coordinator [phone] 05/26/18  12:25 PM

## 2018-05-26 NOTE — Progress Notes (Signed)
Physical Therapy Treatment Patient Details Name: Gwendolyn Pham MRN: [id number] DOB: 10/09/1955 Today's Date: 05/26/2018    History of Present Illness Pt is a 63 year old woman admitted May 24, 2018 with aortic occlusion, now s/p aortobifemoral BPG.  2/27 had hypotension and respiratory distress.  Intubated that night.  Extubated 05/18/18.  CRRT from 2/29/2022 05/18/2018 PMH: smoker, anxiety, depression, DM, asthma, HTN.    PT Comments    Patient slowly progressing towards goals. Patient remains very confused throughout session. Required modA to sit EOB. Patient impulsive and returned to supine stating she didn't feel well. HR increased to 133bpm when sitting EOB but returned to low 120s following return to supine. Attempted to complete HEP but patient unable to remain on task. Current recommendation remains appropriate. Patient will continue to benefit from acute physical therapy to maximize independence and safety with functional mobility.    Follow Up Recommendations  CIR;Supervision/Assistance - 24 hour     Equipment Recommendations  Other (comment)(TBD at next venue)    Recommendations for Other Services       Precautions / Restrictions Precautions Precautions: Fall Precaution Comments: NG tube, colostomy, VAC abdomen Restrictions Weight Bearing Restrictions: No    Mobility  Bed Mobility Overal bed mobility: Needs Assistance Bed Mobility: Supine to Sit;Sit to Supine     Supine to sit: Mod assist Sit to supine: Mod assist   General bed mobility comments: Patient required modA for trunk control and LE management to sit EOB. Once sitting EOB patient was impulsive and layed back down as she reports she did not feel well. Required modA to for LE management to return to supine. HR spiked to 133bpm when sitting at EOB. Returned to low 120s when returned to supine.   Transfers                 General transfer comment: Deferred secondary to elevated HR.   Ambulation/Gait                  Stairs             Wheelchair Mobility    Modified Rankin (Stroke Patients Only)       Balance Overall balance assessment: Needs assistance Sitting-balance support: Bilateral upper extremity supported;Feet supported Sitting balance-Leahy Scale: Poor                                      Cognition Arousal/Alertness: Awake/alert Behavior During Therapy: Restless;Impulsive Overall Cognitive Status: Impaired/Different from baseline Area of Impairment: Safety/judgement;Following commands;Problem solving;Attention;Orientation                   Current Attention Level: Focused   Following Commands: Follows one step commands inconsistently;Follows one step commands with increased time Safety/Judgement: Decreased awareness of safety;Decreased awareness of deficits   Problem Solving: Slow processing;Decreased initiation;Difficulty sequencing;Requires verbal cues;Requires tactile cues General Comments: Patient very confused throughout session. Patient with difficulty following commands. Is unable to remain and sustain attention to tasks.      Exercises General Exercises - Lower Extremity Ankle Circles/Pumps: AROM;5 reps;Both;Supine    General Comments General comments (skin integrity, edema, etc.): Attempted supine HEP however patient unable to remain untask secondary to cognitive impairments.       Pertinent Vitals/Pain Pain Assessment: Faces Faces Pain Scale: No hurt    Home Living  Prior Function            PT Goals (current goals can now be found in the care plan section) Acute Rehab PT Goals PT Goal Formulation: Patient unable to participate in goal setting Time For Goal Achievement: 06/02/18 Potential to Achieve Goals: Fair Progress towards PT goals: Progressing toward goals    Frequency    Min 2X/week      PT Plan Current plan remains appropriate    Co-evaluation               AM-PAC PT "6 Clicks" Mobility   Outcome Measure  Help needed turning from your back to your side while in a flat bed without using bedrails?: A Lot Help needed moving from lying on your back to sitting on the side of a flat bed without using bedrails?: A Lot Help needed moving to and from a bed to a chair (including a wheelchair)?: A Lot Help needed standing up from a chair using your arms (e.g., wheelchair or bedside chair)?: Total Help needed to walk in hospital room?: Total Help needed climbing 3-5 steps with a railing? : Total 6 Click Score: 9    End of Session Equipment Utilized During Treatment: Gait belt Activity Tolerance: Patient tolerated treatment well Patient left: in bed;with call bell/phone within reach;with bed alarm set Nurse Communication: Mobility status PT Visit Diagnosis: Unsteadiness on feet (R26.81);Difficulty in walking, not elsewhere classified (R26.2)     Time: 1211-1224 PT Time Calculation (min) (ACUTE ONLY): 13 min  Charges:  $Therapeutic Activity: 8-22 mins                     Vanessa Ralphs, SPT  Vanessa Ralphs 05/26/2018, 3:09 PM

## 2018-05-26 NOTE — Progress Notes (Signed)
[facility] ROUNDING NOTE   Subjective:   Brief history this 63 year old lady with a history of aortofemoral bypass, hypotension respiratory failure necrotic bowel requiring exploratory laparotomy, intubated anuric kidney injury requiring CRRT.  She has now been converted to intermittent hemodialysis.  Received dialysis 05/25/2018 complicated by hypotension and shock.  Patient was seen and evaluated during dialysis  She continues to be confused with perseveration.  She continues to be oliguric due to hypotension no fluid was removed during dialysis treatment 05/25/2018.. 1784 cc of stool 05/25/2018  Blood pressure 96/56 pulse 52 temperature 97.5  No labs 05/26/2018  Atorvastatin 80 mg daily insulin sliding scale Protonix 40 mg IV daily, Lopressor 5 mg every 8 hours IV, loperamide 4 mg 3 times daily   Objective:  Vital signs in last 24 hours:  Temp:  [97.5 F (36.4 C)-98.5 F (36.9 C)] 97.5 F (36.4 C) (03/13 0303) Pulse Rate:  [34-119] 52 (03/13 0303) Resp:  [14-20] 16 (03/13 0530) BP: (91-137)/(58-88) 136/88 (03/13 0530) SpO2:  [95 %-100 %] 98 % (03/13 0303) Weight:  [58.5 kg] 58.5 kg (03/13 0426)  Weight change: 0.454 kg Filed Weights   05/25/18 0500 05/25/18 0652 05/26/18 0426  Weight: 58.1 kg 58.2 kg 58.5 kg    Intake/Output: I/O last 3 completed shifts: In: 3490 [P.O.:60; I.V.:40; NG/GT:3390] Out: 2509 [Urine:100; Drains:50; Stool:2450]   Intake/Output this shift:  No intake/output data recorded.  CVS- RRR regular rate and rhythm RS- CTA no wheezes or rales ABD- BS present soft non-distended ex lap incision site clean dry dressing colostomy EXT-temporary dialysis catheter left IJ   Basic Metabolic Panel: Recent Labs  Lab 05/20/18 1611 05/21/18 0449 05/22/18 0413 05/23/18 1439 05/24/18 0833 05/25/18 0714  NA 140 141 145 149* 141 141  K 3.5 3.6 4.0 4.7 3.7 3.8  CL 111 114* 118* 122* 103 103  CO2 22 18* 19* 15* 25 23  GLUCOSE 148* 170* 191*  155* 169* 178*  BUN 48* 61* 87* 115* 49* 81*  CREATININE 2.73* 3.18* 4.38* 5.38* 3.29* 4.80*  CALCIUM 7.9* 7.7* 8.1* 8.6* 8.4* 8.9  MG  --   --   --   --  1.7  --   PHOS 2.8 3.5 5.3* 7.9*  --  9.4*    Liver Function Tests: Recent Labs  Lab 05/20/18 1611 05/21/18 0449 05/22/18 0413 05/23/18 1439 05/25/18 0714  ALBUMIN 1.7* 1.6* 1.7* 1.9* 2.0*   No results for input(s): LIPASE, AMYLASE in the last 168 hours. No results for input(s): AMMONIA in the last 168 hours.  CBC: Recent Labs  Lab 05/19/18 1352 05/22/18 0414 05/23/18 1439 05/24/18 0833 05/25/18 0714  WBC 11.9* 13.1* 15.1* 16.2* 15.3*  HGB 9.2* 8.5* 8.8* 9.0* 9.3*  HCT 27.4* 27.1* 27.3* 27.8* 27.8*  MCV 91.9 96.1 97.5 93.3 93.3  PLT 106* 197 268 245 269    Cardiac Enzymes: No results for input(s): CKTOTAL, CKMB, CKMBINDEX, TROPONINI in the last 168 hours.  BNP: Invalid input(s): POCBNP  CBG: Recent Labs  Lab 05/25/18 1238 05/25/18 1629 05/25/18 2020 05/25/18 2358 05/26/18 0411  GLUCAP 141* 147* 167* 167* 172*    Microbiology: Results for orders placed or performed during the hospital encounter of 04/21/2018  Culture, blood (Routine X 2) w Reflex to ID Panel     Status: None   Collection Time: 05/12/18  3:00 AM  Result Value Ref Range Status   Specimen Description BLOOD LEFT ARM  Final   Special Requests   Final  BOTTLES DRAWN AEROBIC AND ANAEROBIC Blood Culture adequate volume   Culture   Final    NO GROWTH 5 DAYS Performed at [facility], [address] [place], [place] [address]    Report Status 05/17/2018 FINAL  Final  Culture, blood (Routine X 2) w Reflex to ID Panel     Status: None   Collection Time: 05/12/18  3:20 AM  Result Value Ref Range Status   Specimen Description BLOOD LEFT HAND  Final   Special Requests   Final    BOTTLES DRAWN AEROBIC ONLY Blood Culture adequate volume   Culture   Final    NO GROWTH 5 DAYS Performed at [facility], [address]  [place], [place] [address]    Report Status 05/17/2018 FINAL  Final    Coagulation Studies: No results for input(s): LABPROT, INR in the last 72 hours.  Urinalysis: No results for input(s): COLORURINE, LABSPEC, PHURINE, GLUCOSEU, HGBUR, BILIRUBINUR, KETONESUR, PROTEINUR, UROBILINOGEN, NITRITE, LEUKOCYTESUR in the last 72 hours.  Invalid input(s): APPERANCEUR    Imaging: Dg Chest Port 1 View  Result Date: 05/25/2018 CLINICAL DATA:  Shortness of breath EXAM: PORTABLE CHEST 1 VIEW COMPARISON:  05/18/2018 FINDINGS: Cardiac shadow is stable. Feeding catheter is noted within the stomach. Left jugular central line extends into the proximal SVC. The lungs are well aerated bilaterally. No focal infiltrate or sizable effusion is seen. No bony abnormality is noted. IMPRESSION: Resolution of previously seen right basilar changes. Tubes and lines as described. Electronically Signed   By: Alcide CleverMark  Lukens M.D.   On: 05/25/2018 14:20   Dg Swallowing Func-speech Pathology  Result Date: 05/24/2018 Objective Swallowing Evaluation: Type of Study: MBS-Modified Barium Swallow Study  Patient Details Name: Donnetta HutchingLaura N Guidice MRN: [id number] Date of Birth: Jan 14, 1956 Today's Date: 05/24/2018 Time: SLP Start Time (ACUTE ONLY): 1512 -SLP Stop Time (ACUTE ONLY): 1529 SLP Time Calculation (min) (ACUTE ONLY): 17 min Past Medical History: Past Medical History: Diagnosis Date . Anxiety  . Asthma  . COPD (chronic obstructive pulmonary disease) (HCC)  . Depression  . Diabetes mellitus without complication (HCC)  . Hyperlipidemia  . Hypertension  Past Surgical History: Past Surgical History: Procedure Laterality Date . AORTA - BILATERAL FEMORAL ARTERY BYPASS GRAFT N/A 05/02/2018  Procedure: AORTA BIFEMORAL BYPASS GRAFT;  Surgeon: Larina EarthlyEarly, Todd F, MD;  [facility];  Service: Vascular;  Laterality: N/A; . BOWEL RESECTION  04/18/2018  Procedure: Resection Small Bowel, LEFT AND RIGHT COLON;  Surgeon: Abigail MiyamotoBlackman, Douglas, MD;  [facility];  Service:  General;; . COLOSTOMY N/A 15-Feb-2019  Procedure: COLOSTOMY;  Surgeon: Violeta Gelinashompson, Burke, MD;  [facility];  Service: General;  Laterality: N/A; . DILATION AND CURETTAGE OF UTERUS   . INNER EAR SURGERY   . LAPAROTOMY N/A 04/19/2018  Procedure: EMERGENCY EXPLORATORY LAPAROTOMY;  Surgeon: Cephus Shellinglark, Christopher J, MD;  [facility];  Service: Vascular;  Laterality: N/A; . LAPAROTOMY N/A 15-Feb-2019  Procedure: EXPLORATORY LAPAROTOMY ILEOCOLONIC ANASTOMOS CLOSURE OF ABDOMEN;  Surgeon: Violeta Gelinashompson, Burke, MD;  [facility];  Service: General;  Laterality: N/A; HPI: Pt is a 63 y.o. female who was admitted 2/26 for aorto femoral bypass.  2/27 had hypotension and respiratory distress.  Intubated that night, fould to be in septic shock, underwent emergent exploratory laparotomy, small bowel resection, bilateral colectomy. Extubated on 3/5 in am.  PMH: HTN, HLD, DM, depression, COPD, asthma, anxiety  Subjective: pt is alert but very distractible Assessment / Plan / Recommendation CHL IP CLINICAL IMPRESSIONS 05/24/2018 Clinical  Impression Pt has a moderate oropharyngeal dysphagia due to cognitive impairments and generalized weakness. Swallow study was initiated in a semi-reclined position as pt was having difficulty tolerating an upright posture. She had poor bolus awareness and weak lingual propulsion, with subsequent reduced bolus cohesion, delayed transit, and premature spillage. Thin liquids were aspirated into the airway before the swallow; nectar thick liquids were only penetrated, and in trace amounts. Part way through the study pt was able to be repositioned into a higher, seated position, which resulted in no further airway compromise with nectar thick liquids. Thin liquids were still aspirated before the swallow. Although no aspiration was observed with nectar thick liquids or purees, she did have mild residue at the valleculae primarily due to reduced base of tongue retraction. Suspect pt would have improved pharyngeal  clearance without cortrak present, but I'm not sure she could nutritionally support herself yet given her mentation. Would continue nutrition primarily via alternative means, but will initiate therapeutic trials of nectar thick liquids and purees to facilitate return to PO diet of these textures.  SLP Visit Diagnosis Dysphagia, oropharyngeal phase (R13.12) Attention and concentration deficit following -- Frontal lobe and executive function deficit following -- Impact on safety and function Moderate aspiration risk   CHL IP TREATMENT RECOMMENDATION 05/24/2018 Treatment Recommendations Therapy as outlined in treatment plan below   Prognosis 05/24/2018 Prognosis for Safe Diet Advancement Good Barriers to Reach Goals Cognitive deficits Barriers/Prognosis Comment -- CHL IP DIET RECOMMENDATION 05/24/2018 SLP Diet Recommendations Alternative means - temporary;NPO Liquid Administration via -- Medication Administration Via alternative means Compensations -- Postural Changes --   CHL IP OTHER RECOMMENDATIONS 05/24/2018 Recommended Consults -- Oral Care Recommendations Oral care QID Other Recommendations --   CHL IP FOLLOW UP RECOMMENDATIONS 05/24/2018 Follow up Recommendations Inpatient Rehab   CHL IP FREQUENCY AND DURATION 05/24/2018 Speech Therapy Frequency (ACUTE ONLY) min 2x/week Treatment Duration 2 weeks      CHL IP ORAL PHASE 05/24/2018 Oral Phase Impaired Oral - Pudding Teaspoon -- Oral - Pudding Cup -- Oral - Honey Teaspoon -- Oral - Honey Cup -- Oral - Nectar Teaspoon -- Oral - Nectar Cup -- Oral - Nectar Straw Decreased bolus cohesion;Delayed oral transit;Premature spillage Oral - Thin Teaspoon -- Oral - Thin Cup -- Oral - Thin Straw Decreased bolus cohesion;Delayed oral transit;Premature spillage Oral - Puree Decreased bolus cohesion;Delayed oral transit Oral - Mech Soft -- Oral - Regular -- Oral - Multi-Consistency -- Oral - Pill -- Oral Phase - Comment --  CHL IP PHARYNGEAL PHASE 05/24/2018 Pharyngeal Phase Impaired  Pharyngeal- Pudding Teaspoon -- Pharyngeal -- Pharyngeal- Pudding Cup -- Pharyngeal -- Pharyngeal- Honey Teaspoon -- Pharyngeal -- Pharyngeal- Honey Cup -- Pharyngeal -- Pharyngeal- Nectar Teaspoon -- Pharyngeal -- Pharyngeal- Nectar Cup -- Pharyngeal -- Pharyngeal- Nectar Straw Reduced pharyngeal peristalsis;Reduced tongue base retraction;Pharyngeal residue - valleculae;Penetration/Aspiration before swallow Pharyngeal Material enters airway, remains ABOVE vocal cords and not ejected out Pharyngeal- Thin Teaspoon -- Pharyngeal -- Pharyngeal- Thin Cup -- Pharyngeal -- Pharyngeal- Thin Straw Reduced pharyngeal peristalsis;Reduced tongue base retraction;Pharyngeal residue - valleculae;Penetration/Aspiration before swallow Pharyngeal Material enters airway, passes BELOW cords and not ejected out despite cough attempt by patient Pharyngeal- Puree Reduced pharyngeal peristalsis;Reduced tongue base retraction;Pharyngeal residue - valleculae Pharyngeal -- Pharyngeal- Mechanical Soft -- Pharyngeal -- Pharyngeal- Regular -- Pharyngeal -- Pharyngeal- Multi-consistency -- Pharyngeal -- Pharyngeal- Pill -- Pharyngeal -- Pharyngeal Comment --  CHL IP CERVICAL ESOPHAGEAL PHASE 05/24/2018 Cervical Esophageal Phase Impaired Pudding Teaspoon -- Pudding Cup -- Honey Teaspoon -- Honey  Cup -- Barrister's clerk Cup -- [employer] Reduced cricopharyngeal relaxation Thin Teaspoon -- Thin Cup -- Thin Straw Reduced cricopharyngeal relaxation Puree Reduced cricopharyngeal relaxation Mechanical Soft -- Regular -- Multi-consistency -- Pill -- Cervical Esophageal Comment -- Clydia Durfey Nix 05/24/2018, 4:41 PM  Ivar Drape, M.A. CCC-SLP Acute Rehabilitation Services Pager [phone] Office [phone]               Medications:   . sodium chloride Stopped (05/24/18 2300)  . sodium chloride    . sodium chloride    . feeding supplement (VITAL AF 1.2 CAL) 50 mL/hr at 05/25/18 1900   . atorvastatin  80 mg Per Tube Daily  .  Chlorhexidine Gluconate Cloth  6 each Topical Daily  . Chlorhexidine Gluconate Cloth  6 each Topical Q0600  . feeding supplement (PRO-STAT SUGAR FREE 64)  30 mL Per Tube TID  . Gerhardt's butt cream   Topical BID  . insulin aspart  0-9 Units Subcutaneous Q4H  . loperamide HCl  4 mg Per Tube TID  . mouth rinse  15 mL Mouth Rinse BID  . metoprolol tartrate  5 mg Intravenous Q8H  . pantoprazole  40 mg Oral Daily  . sodium chloride flush  10-40 mL Intracatheter Q12H   sodium chloride, sodium chloride, sodium chloride, acetaminophen, [DISCONTINUED] acetaminophen **OR** acetaminophen, alteplase, bisacodyl, diphenoxylate-atropine, fentaNYL (SUBLIMAZE) injection, heparin, labetalol, ondansetron, phenol, sodium chloride flush  Assessment/ Plan:   Oliguric acute kidney injury presumably secondary to acute tubular necrosis from hypotension and possibly sepsis necrotic bowel status post exploratory laparotomy with colostomy placement.  She was on CRRT from 2020/04/120 to 05/18/2018 with a temporary dialysis catheter placed in the left IJ.  Patient was seen during dialysis 05/25/2018 complicated by hypotension and shock.  Symptoms resolved no fluid removed during dialysis treatment continue to avoid nephrotoxin agents and also IV contrast.  Does not appear to be return of renal function at this point.  Continue to follow urine output and renal panel on a daily basis.  She is anuric.  We will plan dialysis 05/27/2018  Acute respiratory failure mechanical ventilation followed by pulmonology and critical care patient now extubated and stable  History of septic shock due to ischemic and necrotic bowel now off pressors  Peripheral artery disease status post aorto bifemoral bypass 04/28/2018  Ischemic bowel status post small bowel resection and right and left colectomy with colostomy  Hypotension.  Will discontinue IV Lopressor  History of thrombocytopenia no heparin with dialysis  High output through ostomy.   Continue to follow may need stool bulking agents will defer to general surgery  Confusion and encephalopathy.  Unclear etiology.  Will check TSH, blood cultures x2, CT scan head no contrast, ammonia level consider neurology consultation.    LOS: 16 Garnetta Buddy @TODAY @9 :09 AM

## 2018-05-27 ENCOUNTER — Inpatient Hospital Stay (HOSPITAL_COMMUNITY): Payer: Medicare Other

## 2018-05-27 DIAGNOSIS — I469 Cardiac arrest, cause unspecified: Secondary | ICD-10-CM

## 2018-05-27 LAB — CBC
HCT: 29.1 % — ABNORMAL LOW (ref 36.0–46.0)
Hemoglobin: 9.5 g/dL — ABNORMAL LOW (ref 12.0–15.0)
MCH: 30.4 pg (ref 26.0–34.0)
MCHC: 32.6 g/dL (ref 30.0–36.0)
MCV: 93.3 fL (ref 80.0–100.0)
NRBC: 0 % (ref 0.0–0.2)
Platelets: 394 10*3/uL (ref 150–400)
RBC: 3.12 MIL/uL — AB (ref 3.87–5.11)
RDW: 15.5 % (ref 11.5–15.5)
WBC: 14.8 10*3/uL — ABNORMAL HIGH (ref 4.0–10.5)

## 2018-05-27 LAB — RENAL FUNCTION PANEL
ALBUMIN: 2 g/dL — AB (ref 3.5–5.0)
Albumin: 2.2 g/dL — ABNORMAL LOW (ref 3.5–5.0)
Anion gap: 22 — ABNORMAL HIGH (ref 5–15)
Anion gap: 12 (ref 5–15)
BUN: 131 mg/dL — ABNORMAL HIGH (ref 8–23)
BUN: 68 mg/dL — ABNORMAL HIGH (ref 8–23)
CHLORIDE: 98 mmol/L (ref 98–111)
CO2: 18 mmol/L — ABNORMAL LOW (ref 22–32)
CO2: 22 mmol/L (ref 22–32)
Calcium: 9 mg/dL (ref 8.9–10.3)
Calcium: 8.4 mg/dL — ABNORMAL LOW (ref 8.9–10.3)
Chloride: 103 mmol/L (ref 98–111)
Creatinine, Ser: 6.66 mg/dL — ABNORMAL HIGH (ref 0.44–1.00)
Creatinine, Ser: 3.32 mg/dL — ABNORMAL HIGH (ref 0.44–1.00)
GFR calc Af Amer: 16 mL/min — ABNORMAL LOW (ref >60)
GFR calc non Af Amer: 6 mL/min — ABNORMAL LOW (ref >60)
GFR calc non Af Amer: 14 mL/min — ABNORMAL LOW (ref >60)
GFR, EST AFRICAN AMERICAN: 7 mL/min — AB (ref >60)
Glucose, Bld: 196 mg/dL — ABNORMAL HIGH (ref 70–99)
Glucose, Bld: 150 mg/dL — ABNORMAL HIGH (ref 70–99)
PHOSPHORUS: 5.5 mg/dL — AB (ref 2.5–4.6)
Phosphorus: >30 mg/dL — ABNORMAL HIGH (ref 2.5–4.6)
Potassium: 4.8 mmol/L (ref 3.5–5.1)
Potassium: 4.2 mmol/L (ref 3.5–5.1)
Sodium: 138 mmol/L (ref 135–145)
Sodium: 137 mmol/L (ref 135–145)

## 2018-05-27 LAB — GLUCOSE, CAPILLARY
Glucose-Capillary: 135 mg/dL — ABNORMAL HIGH (ref 70–99)
Glucose-Capillary: 150 mg/dL — ABNORMAL HIGH (ref 70–99)
Glucose-Capillary: 165 mg/dL — ABNORMAL HIGH (ref 70–99)
Glucose-Capillary: 172 mg/dL — ABNORMAL HIGH (ref 70–99)
Glucose-Capillary: 175 mg/dL — ABNORMAL HIGH (ref 70–99)
Glucose-Capillary: 177 mg/dL — ABNORMAL HIGH (ref 70–99)

## 2018-05-27 MED ORDER — PRISMASOL BGK 4/2.5 32-4-2.5 MEQ/L REPLACEMENT SOLN
Status: DC
  Administered 2018-05-27 – 2018-05-30 (×5): via INTRAVENOUS_CENTRAL
  Filled 2018-05-27 (×13): qty 5000

## 2018-05-27 MED ORDER — LIDOCAINE HCL (PF) 1 % IJ SOLN: 5.0000 mL | INTRAMUSCULAR | Status: DC | PRN

## 2018-05-27 MED ORDER — PRISMASOL BGK 4/2.5 32-4-2.5 MEQ/L REPLACEMENT SOLN
Status: DC
  Administered 2018-05-27 – 2018-05-30 (×5): via INTRAVENOUS_CENTRAL
  Filled 2018-05-27 (×13): qty 5000

## 2018-05-27 MED ORDER — PRISMASOL BGK 4/2.5 32-4-2.5 MEQ/L IV SOLN
INTRAVENOUS | Status: DC
  Administered 2018-05-27 – 2018-05-30 (×21): via INTRAVENOUS_CENTRAL
  Filled 2018-05-27 (×49): qty 5000

## 2018-05-27 MED ORDER — SODIUM CHLORIDE 0.9 % IV SOLN: 100.0000 mL | INTRAVENOUS | Status: DC | PRN

## 2018-05-27 MED ORDER — OCTREOTIDE ACETATE 20 MG IM KIT: 20.0000 mg | PACK | Freq: Three times a day (TID) | INTRAMUSCULAR | Status: DC

## 2018-05-27 MED ORDER — PENTAFLUOROPROP-TETRAFLUOROETH EX AERO: 1.0000 "application " | INHALATION_SPRAY | CUTANEOUS | Status: DC | PRN

## 2018-05-27 MED ORDER — HEPARIN SODIUM (PORCINE) 1000 UNIT/ML DIALYSIS
1000.0000 [IU] | INTRAMUSCULAR | Status: DC | PRN
  Administered 2018-05-27 – 2018-05-30 (×2): 2400 [IU] via INTRAVENOUS_CENTRAL
  Filled 2018-05-27 (×4): qty 6
  Filled 2018-05-27: qty 3
  Filled 2018-05-27: qty 5

## 2018-05-27 MED ORDER — RENA-VITE PO TABS
1.0000 | ORAL_TABLET | Freq: Every day | ORAL | Status: DC
  Administered 2018-05-27 – 2018-06-02 (×7): 1 via ORAL
  Filled 2018-05-27 (×7): qty 1

## 2018-05-27 MED ORDER — HEPARIN SODIUM (PORCINE) 1000 UNIT/ML IJ SOLN
INTRAMUSCULAR | Status: AC
  Filled 2018-05-27: qty 4

## 2018-05-27 MED ORDER — ALTEPLASE 2 MG IJ SOLR
2.0000 mg | Freq: Once | INTRAMUSCULAR | Status: DC | PRN
  Filled 2018-05-27: qty 2

## 2018-05-27 MED ORDER — THIAMINE HCL 100 MG/ML IJ SOLN
500.0000 mg | Freq: Every day | INTRAVENOUS | Status: AC
  Administered 2018-05-27 – 2018-05-31 (×5): 500 mg via INTRAVENOUS
  Filled 2018-05-27 (×5): qty 5

## 2018-05-27 MED ORDER — LIDOCAINE-PRILOCAINE 2.5-2.5 % EX CREA
1.0000 "application " | TOPICAL_CREAM | CUTANEOUS | Status: DC | PRN
  Filled 2018-05-27: qty 5

## 2018-05-27 MED ORDER — OCTREOTIDE ACETATE 50 MCG/ML IJ SOLN
50.0000 ug | Freq: Three times a day (TID) | INTRAMUSCULAR | Status: DC
  Administered 2018-05-27 – 2018-05-29 (×6): 50 ug via INTRAVENOUS
  Filled 2018-05-27 (×8): qty 1

## 2018-05-27 MED ORDER — LORAZEPAM 2 MG/ML IJ SOLN
2.0000 mg | INTRAMUSCULAR | Status: DC | PRN
  Administered 2018-05-28 – 2018-05-30 (×4): 2 mg via INTRAVENOUS
  Filled 2018-05-27 (×5): qty 1

## 2018-05-27 MED ORDER — HEPARIN SODIUM (PORCINE) 1000 UNIT/ML DIALYSIS: 1000.0000 [IU] | INTRAMUSCULAR | Status: DC | PRN

## 2018-05-27 MED ORDER — SODIUM CHLORIDE 0.9 % FOR CRRT
INTRAVENOUS_CENTRAL | Status: DC | PRN
  Filled 2018-05-27: qty 1000

## 2018-05-27 NOTE — Progress Notes (Addendum)
Patient ID: Gwendolyn Pham, female   DOB: Jan 25, 1956, 63 y.o.   MRN: [id number] Discussed with Dr. Hyman Hopes.  Patient not tolerating hemodialysis.  Will need transfer to intensive care unit for CRRT.  Will transfer to 4 N.  Dr. Hyman Hopes will reconsult critical care for management.  I updated her sister Ms. Maynard by telephone

## 2018-05-27 NOTE — Progress Notes (Signed)
Patient ID: Gwendolyn Pham, female   DOB: 10/28/1955, 63 y.o.   MRN: [id number]  Progress Note    05/27/2018 9:40 AM 14 Days Post-Op  Subjective: Patient is in 4 E. progressive unit.  Was taken to hemodialysis this morning.  Soon after arriving in hemodialysis became agonal in her breathing.  A code was called.  Apparently had a few chest compressions of less than a minute but never lost her pressure.  Had bagged breathing for several minutes.  Was then spontaneously breathing and was not intubated.  Stayed hemodynamically stable and was transferred back to 4 E. progressive unit.  She is still mildly confused but actually more alert than yesterday on my visit with her.  Only complains of some abdominal soreness.  Oxygen saturation is 99% on 6 L.  Talking in no respiratory distress.  Sinus rhythm.   Vitals:   05/27/18 0920 05/27/18 0926  BP: (!) 88/47 96/67  Pulse: (!) 41   Resp:    Temp: 98.5 F (36.9 C)   SpO2: 97%    Physical Exam: Abdomen soft.  Both groin incisions healing normally.  2+ dorsalis pedis pulses bilaterally.  CBC    Component Value Date/Time   WBC 15.3 (H) 05/25/2018 0714   RBC 2.98 (L) 05/25/2018 0714   HGB 9.3 (L) 05/25/2018 0714   HGB 15.8 01/23/2018 1218   HCT 27.8 (L) 05/25/2018 0714   HCT 45.9 01/23/2018 1218   PLT 269 05/25/2018 0714   PLT 253 01/23/2018 1218   MCV 93.3 05/25/2018 0714   MCV 93 01/23/2018 1218   MCH 31.2 05/25/2018 0714   MCHC 33.5 05/25/2018 0714   RDW 15.3 05/25/2018 0714   RDW 13.7 01/23/2018 1218   LYMPHSABS 1.1 05/01/2018 1802   LYMPHSABS 2.8 01/23/2018 1218   MONOABS 0.3 04/19/2018 1802   EOSABS 0.0 05/10/2018 1802   EOSABS 0.1 01/23/2018 1218   BASOSABS 0.0 05/04/2018 1802   BASOSABS 0.1 01/23/2018 1218    BMET    Component Value Date/Time   NA 141 05/25/2018 0714   NA 137 01/23/2018 1218   K 3.8 05/25/2018 0714   CL 103 05/25/2018 0714   CO2 23 05/25/2018 0714   GLUCOSE 178 (H) 05/25/2018 0714   BUN 81 (H)  05/25/2018 0714   BUN 11 01/23/2018 1218   CREATININE 4.80 (H) 05/25/2018 0714   CREATININE 0.66 06/18/2015 1855   CALCIUM 8.9 05/25/2018 0714   GFRNONAA 9 (L) 05/25/2018 0714   GFRNONAA >89 01/31/2014 1532   GFRAA 10 (L) 05/25/2018 0714   GFRAA >89 01/31/2014 1532    INR    Component Value Date/Time   INR 1.4 (H) 05/12/2018 0415     Intake/Output Summary (Last 24 hours) at 05/27/2018 0940 Last data filed at 05/27/2018 0529 Gross per 24 hour  Intake 460 ml  Output 2200 ml  Net -1740 ml     Assessment/Plan:  63 y.o. female has now had episode of hypotension with hemodialysis on her prior session requiring Aminah Zabawa discontinuation.  Respiratory distress this morning.  Continue to monitor closely.     Larina Earthly, MD FACS [facility] [phone] 05/27/2018 9:40 AM

## 2018-05-27 NOTE — Progress Notes (Addendum)
[facility] ROUNDING NOTE   Subjective:   Brief history this 63 year old lady with a history of aortofemoral bypass, hypotension respiratory failure necrotic bowel requiring exploratory laparotomy, intubated anuric kidney injury requiring CRRT.  She has now been converted to intermittent hemodialysis.  She is still confused.  This is been confusion that has been present for at least 2 weeks.  She developed a respiratory arrest on dialysis 05/27/2018.  She may not be stable enough to receive intermittent dialysis treatments.  Fortunately she responded without needing intubation.  Neurology was consulted.  EEG showed background slowing consistent with encephalopathy or dementia.  CT scan showed motion degraded examination with left thalamus infarct versus motion and artifact moderate chronic small vessel ischemic changes old frontal MCA territory infarct.  2 L of stool 05/26/2018.  Anuric  She continues to be confused with perseveration.  She continues to be oliguric due to hypotension no fluid was removed during dialysis treatment 05/25/2018.. 1784 cc of stool 05/25/2018  Blood pressure 96/67 pulse 41 temperature 98.5  Sodium 138 potassium 4.8 chloride 98 CO2 18 BUN 131 creatinine 6.6 glucose 196 WBC 14.8 hemoglobin 9.5 platelets 394.  TSH 0.999.  Ammonia level 32  Atorvastatin 80 mg daily insulin sliding scale Protonix 40 mg IV daily,, loperamide 4 mg 3 times daily   Objective:  Vital signs in last 24 hours:  Temp:  [97.4 F (36.3 C)-98.5 F (36.9 C)] 98.5 F (36.9 C) (03/14 0920) Pulse Rate:  [41-127] 41 (03/14 0920) Resp:  [15-29] 16 (03/14 0700) BP: (84-131)/(47-95) 96/67 (03/14 0926) SpO2:  [83 %-97 %] 97 % (03/14 0920) Weight:  [61.2 kg] 61.2 kg (03/14 0450)  Weight change: 2.722 kg Filed Weights   05/25/18 0652 05/26/18 0426 05/27/18 0450  Weight: 58.2 kg 58.5 kg 61.2 kg    Intake/Output: I/O last 3 completed shifts: In: 1460 [P.O.:60; I.V.:10;  NG/GT:1390] Out: 3175 [Urine:50; Stool:3125]   Intake/Output this shift:  No intake/output data recorded.  CVS- RRR regular rate and rhythm RS- CTA no wheezes or rales ABD- BS present soft non-distended ex lap incision site clean dry dressing colostomy EXT-temporary dialysis catheter left IJ   Basic Metabolic Panel: Recent Labs  Lab 05/20/18 1611 05/21/18 0449 05/22/18 0413 05/23/18 1439 05/24/18 0833 05/25/18 0714  NA 140 141 145 149* 141 141  K 3.5 3.6 4.0 4.7 3.7 3.8  CL 111 114* 118* 122* 103 103  CO2 22 18* 19* 15* 25 23  GLUCOSE 148* 170* 191* 155* 169* 178*  BUN 48* 61* 87* 115* 49* 81*  CREATININE 2.73* 3.18* 4.38* 5.38* 3.29* 4.80*  CALCIUM 7.9* 7.7* 8.1* 8.6* 8.4* 8.9  MG  --   --   --   --  1.7  --   PHOS 2.8 3.5 5.3* 7.9*  --  9.4*    Liver Function Tests: Recent Labs  Lab 05/20/18 1611 05/21/18 0449 05/22/18 0413 05/23/18 1439 05/25/18 0714  ALBUMIN 1.7* 1.6* 1.7* 1.9* 2.0*   No results for input(s): LIPASE, AMYLASE in the last 168 hours. Recent Labs  Lab 05/26/18 1042  AMMONIA 32    CBC: Recent Labs  Lab 05/22/18 0414 05/23/18 1439 05/24/18 0833 05/25/18 0714 05/27/18 0927  WBC 13.1* 15.1* 16.2* 15.3* 14.8*  HGB 8.5* 8.8* 9.0* 9.3* 9.5*  HCT 27.1* 27.3* 27.8* 27.8* 29.1*  MCV 96.1 97.5 93.3 93.3 93.3  PLT 197 268 245 269 394    Cardiac Enzymes: No results for input(s): CKTOTAL, CKMB, CKMBINDEX, TROPONINI in the last 168  hours.  BNP: Invalid input(s): POCBNP  CBG: Recent Labs  Lab 05/26/18 1810 05/26/18 2027 05/27/18 0006 05/27/18 0447 05/27/18 0916  GLUCAP 163* 169* 175* 177* 165*    Microbiology: Results for orders placed or performed during the hospital encounter of 05/09/2018  Culture, blood (Routine X 2) w Reflex to ID Panel     Status: None   Collection Time: 05/12/18  3:00 AM  Result Value Ref Range Status   Specimen Description BLOOD LEFT ARM  Final   Special Requests   Final    BOTTLES DRAWN AEROBIC AND  ANAEROBIC Blood Culture adequate volume   Culture   Final    NO GROWTH 5 DAYS Performed at [facility], [address], [place], [place] [address]    Report Status 05/17/2018 FINAL  Final  Culture, blood (Routine X 2) w Reflex to ID Panel     Status: None   Collection Time: 05/12/18  3:20 AM  Result Value Ref Range Status   Specimen Description BLOOD LEFT HAND  Final   Special Requests   Final    BOTTLES DRAWN AEROBIC ONLY Blood Culture adequate volume   Culture   Final    NO GROWTH 5 DAYS Performed at [facility], [address] [place], [place] [address]    Report Status 05/17/2018 FINAL  Final    Coagulation Studies: No results for input(s): LABPROT, INR in the last 72 hours.  Urinalysis: No results for input(s): COLORURINE, LABSPEC, PHURINE, GLUCOSEU, HGBUR, BILIRUBINUR, KETONESUR, PROTEINUR, UROBILINOGEN, NITRITE, LEUKOCYTESUR in the last 72 hours.  Invalid input(s): APPERANCEUR    Imaging: Ct Head Wo Contrast  Result Date: 05/26/2018 CLINICAL DATA:  Altered mental status. History of hypertension, hyperlipidemia and diabetes. EXAM: CT HEAD WITHOUT CONTRAST TECHNIQUE: Contiguous axial images were obtained from the base of the skull through the vertex without intravenous contrast. COMPARISON:  CT HEAD May 12, 2018. FINDINGS: Mild motion degraded examination. BRAIN: No intraparenchymal hemorrhage, mass effect nor midline shift. No parenchymal brain volume loss for age. No hydrocephalus. Patchy supratentorial white matter hypodensities. RIGHT posterior frontal encephalomalacia. Small LEFT thalamus hypodensity. No acute large vascular territory infarcts. No abnormal extra-axial fluid collections. Basal cisterns are patent. VASCULAR: Moderate to severe calcific atherosclerosis of the carotid siphons. SKULL: No skull fracture. No significant scalp soft tissue swelling. SINUSES/ORBITS: Trace paranasal sinus mucosal thickening. Minimal RIGHT mastoid effusion.The  included ocular globes and orbital contents are non-suspicious. OTHER: Nasogastric tube via LEFT nares. IMPRESSION: 1. Motion degraded examination. LEFT thalamus infarct versus motion artifact. 2. Moderate chronic small vessel ischemic changes. Old RIGHT frontal/MCA territory infarct. Electronically Signed   By: Awilda Metro M.D.   On: 05/26/2018 15:39   Dg Chest Port 1 View  Result Date: 05/25/2018 CLINICAL DATA:  Shortness of breath EXAM: PORTABLE CHEST 1 VIEW COMPARISON:  05/18/2018 FINDINGS: Cardiac shadow is stable. Feeding catheter is noted within the stomach. Left jugular central line extends into the proximal SVC. The lungs are well aerated bilaterally. No focal infiltrate or sizable effusion is seen. No bony abnormality is noted. IMPRESSION: Resolution of previously seen right basilar changes. Tubes and lines as described. Electronically Signed   By: Alcide Clever M.D.   On: 05/25/2018 14:20     Medications:   . sodium chloride Stopped (05/24/18 2300)  . sodium chloride    . sodium chloride    . sodium chloride    . sodium chloride    . feeding supplement (VITAL AF 1.2 CAL) 50  mL/hr at 05/27/18 0949   . atorvastatin  80 mg Per Tube Daily  . Chlorhexidine Gluconate Cloth  6 each Topical Daily  . Chlorhexidine Gluconate Cloth  6 each Topical Q0600  . Chlorhexidine Gluconate Cloth  6 each Topical Q0600  . diphenoxylate-atropine  5 mL Per Tube TID  . feeding supplement (PRO-STAT SUGAR FREE 64)  30 mL Per Tube TID  . Gerhardt's butt cream   Topical BID  . heparin      . insulin aspart  0-9 Units Subcutaneous Q4H  . loperamide HCl  4 mg Per Tube TID  . mouth rinse  15 mL Mouth Rinse BID  . pantoprazole  40 mg Oral Daily  . sodium chloride flush  10-40 mL Intracatheter Q12H   sodium chloride, sodium chloride, sodium chloride, sodium chloride, sodium chloride, acetaminophen, [DISCONTINUED] acetaminophen **OR** acetaminophen, alteplase, alteplase, bisacodyl, fentaNYL (SUBLIMAZE)  injection, heparin, heparin, labetalol, lidocaine (PF), lidocaine-prilocaine, ondansetron, pentafluoroprop-tetrafluoroeth, phenol, sodium chloride flush  Assessment/ Plan:   Oliguric acute kidney injury presumably secondary to acute tubular necrosis from hypotension and possibly sepsis necrotic bowel status post exploratory laparotomy with colostomy placement.  She was on CRRT from 2/29/2022 to 05/18/2018 with a temporary dialysis catheter placed in the left IJ.  Patient was seen during dialysis 05/27/2018.  This was another complicated dialysis treatment now with respiratory distress.  She does not seem to be tolerating hemodialysis.  We will see if primary service wishes to transfer her back to stepdown for CRRT.  Her BUN is greater than 100 this may be contributing to her mental status change.  I would favor CRRT in this lady.  Acute respiratory failure mechanical ventilation followed by pulmonology and critical care patient now extubated and stable  History of septic shock due to ischemic and necrotic bowel now off pressors  Peripheral artery disease status post aorto bifemoral bypass 04/22/2018  Ischemic bowel status post small bowel resection and right and left colectomy with colostomy  Hypotension.  Will discontinue IV Lopressor  History of thrombocytopenia no heparin with dialysis  High output through ostomy.  Continue to follow may need stool bulking agents will defer to general surgery  Confusion and encephalopathy.  It appears that she has some encephalopathy probably related to uremia and under dialysis will proceed with CRRT    LOS: 17 Gwendolyn Pham @TODAY @10 :16 AM

## 2018-05-27 NOTE — Progress Notes (Signed)
When patient arrived on the HD unit he was was at reported base line alert to self only and confused,  vital signs T 97.5 P127 R 20 BP 152/79 O2 90%.  Patient appeared to be short of breath so she was put on non re-breather mask and a rapid response was called,  about a minute or two  later she was taking agonal breaths and was unresponsive and pulseless, chest compressions were initiated  and a code blue was called.  Patient was recovered to her base line, reports were called to Dr Hyman Hopes, Dr Early and the floor nurse Bradley Ferris Rn, patient was returned to her room in table condition accompanied by this RN and Code nurse.

## 2018-05-27 NOTE — Progress Notes (Signed)
Spoke with Dr. Hyman Hopes while on the unit. Made him aware that Pt. may have dislodge HD cath. Awaiting CXR results.  Gave ok to restart CRRT in AM if CCM doctor was not ok with HD cath position. Spoke with CCM doc about HD cath. CRRT attempt failed unable to get machine started.

## 2018-05-27 NOTE — Progress Notes (Signed)
eLink Physician-Brief Progress Note Patient Name: Gwendolyn Pham DOB: Nov 27, 1955 MRN: [id number]   Date of Service  05/27/2018  HPI/Events of Note  On review of CXR HD catheter seems to have retracted due to patient movement but able to flush and pull back blood with ease with no leak.  eICU Interventions  Instructed bedside to initiate CRRT cautiously but to stop if problems encountered and informed renal     Intervention Category Intermediate Interventions: Other:  Darl Pikes 05/27/2018, 8:16 PM

## 2018-05-27 NOTE — Progress Notes (Signed)
NAME:  Gwendolyn Pham, MRN:  [id number], DOB:  07/26/1955, LOS: 17 ADMISSION DATE:  05/09/2018, CONSULTATION DATE:  05/27/2018 REFERRING MD:  Hyman Hopes - Nephrology, CHIEF COMPLAINT:  Acute renal failure.   HPI/course in hospital  63 year old woman with complicated course following aorto-bifemoral bypass performed 2/26 for severe claudication with aortic occlusion.  Complicated course thereafter by ischemic bowel with resection of distal small bowel and partial colectomy with eventual closure of the abdomen after a multistep surgery on 2/29. She has developed AKI and required HD. She has poor would healing. She has had increasing hypotension and has been unable to tolerate IHD. As such she has become increasingly confused due to uremia.   3/14 she sustained a brief cardiac arrest on initiation of HD.  Past Medical History   Past Medical History:  Diagnosis Date  . Anxiety   . Asthma   . COPD (chronic obstructive pulmonary disease) (HCC)   . Depression   . Diabetes mellitus without complication (HCC)   . Hyperlipidemia   . Hypertension      Past Surgical History:  Procedure Laterality Date  . AORTA - BILATERAL FEMORAL ARTERY BYPASS GRAFT N/A 04/20/2018   Procedure: AORTA BIFEMORAL BYPASS GRAFT;  Surgeon: Larina Earthly, MD;  Location: [facility] OR;  Service: Vascular;  Laterality: N/A;  . BOWEL RESECTION  May 19, 2018   Procedure: Resection Small Bowel, LEFT AND RIGHT COLON;  Surgeon: Abigail Miyamoto, MD;  Location: [facility] OR;  Service: General;;  . COLOSTOMY N/A 05/06/2018   Procedure: COLOSTOMY;  Surgeon: Violeta Gelinas, MD;  Location: [facility] OR;  Service: General;  Laterality: N/A;  . DILATION AND CURETTAGE OF UTERUS    . INNER EAR SURGERY    . LAPAROTOMY N/A 2018-05-19   Procedure: EMERGENCY EXPLORATORY LAPAROTOMY;  Surgeon: Cephus Shelling, MD;  Location: [facility] OR;  Service: Vascular;  Laterality: N/A;  . LAPAROTOMY N/A 05/05/2018   Procedure: EXPLORATORY LAPAROTOMY ILEOCOLONIC ANASTOMOS CLOSURE OF  ABDOMEN;  Surgeon: Violeta Gelinas, MD;  Location: [facility] OR;  Service: General;  Laterality: N/A;     Review of Systems:   Review of Systems  Unable to perform ROS: Medical condition    Social History   reports that she has been smoking cigarettes. She has a 25.00 pack-year smoking history. She has never used smokeless tobacco. She reports that she does not drink alcohol or use drugs.   Family History   Her family history includes Arthritis in her sister; COPD in her father; Diabetes in her father, mother, sister, and sister; Heart disease (age of onset: 2) in her father; Hypertension in her mother; Mental illness in her sister.   Allergies Allergies  Allergen Reactions  . Lisinopril Cough    Muscle pain   . Penicillins Rash    Did it involve swelling of the face/tongue/throat, SOB, or low BP? No Did it involve sudden or severe rash/hives, skin peeling, or any reaction on the inside of your mouth or nose? No Did you need to seek medical attention at a hospital or doctor's office? No When did it last happen?Longer than 10 years ago If all above answers are "NO", may proceed with cephalosporin use.      Home Medications  Prior to Admission medications   Medication Sig Start Date End Date Taking? Authorizing Provider  acetaminophen (TYLENOL) 325 MG tablet Take 650 mg by mouth every 6 (six) hours as needed for moderate pain.    Yes [provider]  aspirin EC 81 MG  tablet Take 1 tablet (81 mg total) by mouth daily. 04/20/18  Yes Patwardhan, Manish J, MD  atorvastatin (LIPITOR) 80 MG tablet Take 1 tablet (80 mg total) by mouth daily. 04/14/18  Yes Myles Lipps, MD  buPROPion [facility] SR) 150 MG 12 hr tablet Take 1 tablet (150 mg total) by mouth 2 (two) times daily. 01/23/18  Yes Myles Lipps, MD  Fluticasone-Salmeterol (ADVAIR) 100-50 MCG/DOSE AEPB Inhale 1 puff into the lungs 2 (two) times daily.   Yes [provider]  hydrochlorothiazide (HYDRODIURIL)  25 MG tablet Take 1 tablet (25 mg total) by mouth daily. 01/23/18  Yes Myles Lipps, MD  metFORMIN (GLUCOPHAGE) 500 MG tablet Take 1 tablet (500 mg total) by mouth daily with breakfast. 01/23/18  Yes Myles Lipps, MD  metoprolol tartrate (LOPRESSOR) 25 MG tablet Take 1 tablet (25 mg total) by mouth 2 (two) times daily. 01/23/18  Yes Myles Lipps, MD  venlafaxine XR (EFFEXOR-XR) 150 MG 24 hr capsule TAKE 1 CAPSULE BY MOUTH EVERY DAY WITH BREAKFAST Patient taking differently: Take 150 mg by mouth daily with breakfast.  02/10/18  Yes Myles Lipps, MD     Interim history/subjective:  Brought to ICU for initiation of CRRT. Denies pain or shortness of breath.  Objective   Blood pressure (!) 88/73, pulse 75, temperature 98.5 F (36.9 C), temperature source Axillary, resp. rate (!) 23, height 5\' 4"  (1.626 m), weight 61.2 kg, SpO2 (!) 88 %.        Intake/Output Summary (Last 24 hours) at 05/27/2018 1521 Last data filed at 05/27/2018 1500 Gross per 24 hour  Intake 210 ml  Output 2695 ml  Net -2485 ml   Filed Weights   05/25/18 0652 05/26/18 0426 05/27/18 0450  Weight: 58.2 kg 58.5 kg 61.2 kg    Examination: Physical Exam  Constitutional: She appears well-developed and well-nourished. She appears listless. No distress.  HENT:  Mouth/Throat: Oropharynx is clear and moist. No oropharyngeal exudate.  Cortrak in place  Eyes: Pupils are equal, round, and reactive to light. EOM are normal.  Neck: No JVD present. Edema present.  Cardiovascular: Normal rate, regular rhythm, S1 normal, S2 normal and normal pulses.  Respiratory: Breath sounds normal. No accessory muscle usage or stridor. No respiratory distress.  GI: Bowel sounds are increased. There is no abdominal tenderness.    Minimal VAC drainage. Stool output from ostomy  Neurological: She appears listless.     Ancillary tests (personally reviewed)  CBC: Recent Labs  Lab 05/22/18 0414 05/23/18 1439 05/24/18 0833  05/25/18 0714 05/27/18 0927  WBC 13.1* 15.1* 16.2* 15.3* 14.8*  HGB 8.5* 8.8* 9.0* 9.3* 9.5*  HCT 27.1* 27.3* 27.8* 27.8* 29.1*  MCV 96.1 97.5 93.3 93.3 93.3  PLT 197 268 245 269 394    Basic Metabolic Panel: Recent Labs  Lab 05/21/18 0449 05/22/18 0413 05/23/18 1439 05/24/18 0833 05/25/18 0714 05/27/18 0927  NA 141 145 149* 141 141 138  K 3.6 4.0 4.7 3.7 3.8 4.8  CL 114* 118* 122* 103 103 98  CO2 18* 19* 15* 25 23 18*  GLUCOSE 170* 191* 155* 169* 178* 196*  BUN 61* 87* 115* 49* 81* 131*  CREATININE 3.18* 4.38* 5.38* 3.29* 4.80* 6.66*  CALCIUM 7.7* 8.1* 8.6* 8.4* 8.9 9.0  MG  --   --   --  1.7  --   --   PHOS 3.5 5.3* 7.9*  --  9.4* >30.0*   GFR: Estimated Creatinine Clearance: 7.5 mL/min (A) (  by C-G formula based on SCr of 6.66 mg/dL (H)). Recent Labs  Lab 05/23/18 1439 05/24/18 0833 05/25/18 0714 05/27/18 0927  WBC 15.1* 16.2* 15.3* 14.8*    Liver Function Tests: Recent Labs  Lab 05/21/18 0449 05/22/18 0413 05/23/18 1439 05/25/18 0714 05/27/18 0927  ALBUMIN 1.6* 1.7* 1.9* 2.0* 2.2*   No results for input(s): LIPASE, AMYLASE in the last 168 hours. Recent Labs  Lab 05/26/18 1042  AMMONIA 32    ABG    Component Value Date/Time   PHART 7.496 (H) 05/18/2018 0405   PCO2ART 32.6 05/18/2018 0405   PO2ART 98.3 05/18/2018 0405   HCO3 25.0 05/18/2018 0405   TCO2 26 05/16/2018 0344   ACIDBASEDEF 5.0 (H) 04/29/2018 0927   O2SAT 97.4 05/18/2018 0405     Coagulation Profile: No results for input(s): INR, PROTIME in the last 168 hours.  Cardiac Enzymes: No results for input(s): CKTOTAL, CKMB, CKMBINDEX, TROPONINI in the last 168 hours.  HbA1C: Hgb A1c MFr Bld  Date/Time Value Ref Range Status  05/02/2018 10:28 AM 6.4 (H) 4.8 - 5.6 % Final    Comment:    (NOTE)         Prediabetes: 5.7 - 6.4         Diabetes: >6.4         Glycemic control for adults with diabetes: <7.0   01/23/2018 12:18 PM 6.4 (H) 4.8 - 5.6 % Final    Comment:              Prediabetes: 5.7 - 6.4          Diabetes: >6.4          Glycemic control for adults with diabetes: <7.0     CBG: Recent Labs  Lab 05/26/18 2027 05/27/18 0006 05/27/18 0447 05/27/18 0916 05/27/18 1153  GLUCAP 169* 175* 177* 165* 172*   Assessment & Plan:   Acute renal failure with inability to tolerate HD.  Confusion with normal level of consciousness Status post resection of ischemic gut with slow wound healing.  PLAN  Initiate CRRT Continue to monitor mental status - should improve with dialysis. Vitamin supplementation while on CRRT Continue feeding for malnutrition.   Best practice:  Diet: Continue tube feeding Pain/Anxiety/Delirium protocol (if indicated): prn only VAP protocol (if indicated): n/a DVT prophylaxis: UFH GI prophylaxis: n/a Urinary catheter: none Glucose control: Phase 1  Mobility: bedrest Code Status: full Family Communication: none present. Disposition: ICU   Critical care time: n/a    Lynnell Catalan, MD [facility] ICU Physician [facility]  Pager: [phone] Mobile: [phone] After hours: [phone].  05/27/2018, 3:21 PM

## 2018-05-27 NOTE — Code Documentation (Signed)
  Patient Name: Gwendolyn Pham   MRN: [id number]   Date of Birth/ Sex: 1955/04/21 , female      Admission Date: 04/27/2018  Attending Provider: Larina Earthly, MD  Primary Diagnosis: <principal problem not specified>   Indication: Pt was in her usual state of health until this AM, when she was noted to have agonal breathing. Code blue was subsequently called. At the time of arrival on scene, ACLS protocol was underway.   Technical Description:  - CPR performance duration:  1 minute  - Was defibrillation or cardioversion used? No   - Was external pacer placed? No  - Was patient intubated pre/post CPR? No   Medications Administered: Y = Yes; Blank = No Amiodarone    Atropine    Calcium    Epinephrine    Lidocaine    Magnesium    Norepinephrine    Phenylephrine    Sodium bicarbonate    Vasopressin     Post CPR evaluation:  - Final Status - Was patient successfully resuscitated ? Yes - What is current rhythm? Sinus, multifocal atrial tach - What is current hemodynamic status? Stable, alert  Miscellaneous Information:  - Labs sent, including: CBC, Renal function  - Primary team notified?  Yes  - Family Notified? No  - Additional notes/ transfer status: Code blue occurred when patient was at HD unit. Will go back to floor when HD completed     Gwendolyn Pretty, MD  05/27/2018, 9:34 AM

## 2018-05-27 NOTE — Progress Notes (Signed)
OT Cancellation Note  Patient Details Name: CHERRISH DEITERING MRN: [id number] DOB: 04-May-1955   Cancelled Treatment:    Reason Eval/Treat Not Completed: Medical issues which prohibited therapy. Pt with code this am in HD. Will hold at this time and check back for medical readiness.   Sterling Mondo Marcy Panning, OTR/L Acute Rehabilitation Services Office [phone]   Roland Lipke A Brittan Butterbaugh 05/27/2018, 9:58 AM

## 2018-05-28 ENCOUNTER — Inpatient Hospital Stay (HOSPITAL_COMMUNITY): Payer: Medicare Other

## 2018-05-28 LAB — GLUCOSE, CAPILLARY
GLUCOSE-CAPILLARY: 169 mg/dL — AB (ref 70–99)
Glucose-Capillary: 151 mg/dL — ABNORMAL HIGH (ref 70–99)
Glucose-Capillary: 174 mg/dL — ABNORMAL HIGH (ref 70–99)
Glucose-Capillary: 122 mg/dL — ABNORMAL HIGH (ref 70–99)
Glucose-Capillary: 119 mg/dL — ABNORMAL HIGH (ref 70–99)
Glucose-Capillary: 134 mg/dL — ABNORMAL HIGH (ref 70–99)

## 2018-05-28 LAB — RENAL FUNCTION PANEL
ANION GAP: 14 (ref 5–15)
Albumin: 2 g/dL — ABNORMAL LOW (ref 3.5–5.0)
Albumin: 2 g/dL — ABNORMAL LOW (ref 3.5–5.0)
Anion gap: 16 — ABNORMAL HIGH (ref 5–15)
BUN: 101 mg/dL — ABNORMAL HIGH (ref 8–23)
BUN: 114 mg/dL — AB (ref 8–23)
CALCIUM: 8.8 mg/dL — AB (ref 8.9–10.3)
CO2: 21 mmol/L — ABNORMAL LOW (ref 22–32)
CO2: 22 mmol/L (ref 22–32)
Calcium: 8.7 mg/dL — ABNORMAL LOW (ref 8.9–10.3)
Chloride: 102 mmol/L (ref 98–111)
Chloride: 103 mmol/L (ref 98–111)
Creatinine, Ser: 5.02 mg/dL — ABNORMAL HIGH (ref 0.44–1.00)
Creatinine, Ser: 5.54 mg/dL — ABNORMAL HIGH (ref 0.44–1.00)
GFR calc Af Amer: 10 mL/min — ABNORMAL LOW (ref >60)
GFR calc Af Amer: 9 mL/min — ABNORMAL LOW (ref >60)
GFR calc non Af Amer: 9 mL/min — ABNORMAL LOW (ref >60)
GFR calc non Af Amer: 8 mL/min — ABNORMAL LOW (ref >60)
GLUCOSE: 133 mg/dL — AB (ref 70–99)
Glucose, Bld: 171 mg/dL — ABNORMAL HIGH (ref 70–99)
POTASSIUM: 5.2 mmol/L — AB (ref 3.5–5.1)
Phosphorus: 9.2 mg/dL — ABNORMAL HIGH (ref 2.5–4.6)
Phosphorus: 10.8 mg/dL — ABNORMAL HIGH (ref 2.5–4.6)
Potassium: 5.2 mmol/L — ABNORMAL HIGH (ref 3.5–5.1)
Sodium: 139 mmol/L (ref 135–145)
Sodium: 139 mmol/L (ref 135–145)

## 2018-05-28 LAB — MAGNESIUM: Magnesium: 2.2 mg/dL (ref 1.7–2.4)

## 2018-05-28 MED ORDER — NOREPINEPHRINE 4 MG/250ML-% IV SOLN
0.0000 ug/min | INTRAVENOUS | Status: DC
  Administered 2018-05-28: 5 ug/min via INTRAVENOUS
  Administered 2018-05-29: 9 ug/min via INTRAVENOUS
  Administered 2018-05-29: 8 ug/min via INTRAVENOUS
  Administered 2018-05-29 – 2018-05-30 (×2): 6 ug/min via INTRAVENOUS
  Administered 2018-05-31: 4 ug/min via INTRAVENOUS
  Administered 2018-06-01: 3 ug/min via INTRAVENOUS
  Administered 2018-06-02: 4 ug/min via INTRAVENOUS
  Filled 2018-05-28 (×8): qty 250

## 2018-05-28 MED ORDER — MIDAZOLAM HCL 2 MG/2ML IJ SOLN
INTRAMUSCULAR | Status: AC
  Administered 2018-05-28: 2 mg
  Filled 2018-05-28: qty 2

## 2018-05-28 MED ORDER — DEXMEDETOMIDINE HCL IN NACL 400 MCG/100ML IV SOLN
0.4000 ug/kg/h | INTRAVENOUS | Status: DC
  Administered 2018-05-28: 0.8 ug/kg/h via INTRAVENOUS
  Administered 2018-05-28: 0.4 ug/kg/h via INTRAVENOUS
  Administered 2018-05-29: 1.2 ug/kg/h via INTRAVENOUS
  Administered 2018-05-29: 0.8 ug/kg/h via INTRAVENOUS
  Administered 2018-05-29: 0.9 ug/kg/h via INTRAVENOUS
  Administered 2018-05-30: 1.2 ug/kg/h via INTRAVENOUS
  Administered 2018-05-30: 0.9 ug/kg/h via INTRAVENOUS
  Administered 2018-05-30 – 2018-06-01 (×8): 1.2 ug/kg/h via INTRAVENOUS
  Administered 2018-06-02: 0.8 ug/kg/h via INTRAVENOUS
  Administered 2018-06-02: 1.3 ug/kg/h via INTRAVENOUS
  Administered 2018-06-02: 0.8 ug/kg/h via INTRAVENOUS
  Administered 2018-06-02 – 2018-06-03 (×2): 1.2 ug/kg/h via INTRAVENOUS
  Administered 2018-06-03: 1.3 ug/kg/h via INTRAVENOUS
  Filled 2018-05-28 (×23): qty 100

## 2018-05-28 NOTE — Progress Notes (Signed)
Patient ID: Gwendolyn Pham, female   DOB: 23-Nov-1955, 63 y.o.   MRN: [id number]  Progress Note    05/28/2018 8:06 AM 15 Days Post-Op  Subjective: Answers questions and follows commands.  Still confused.  Pulled out her left IJ dialysis catheter last night   Vitals:   05/28/18 0630 05/28/18 0700  BP: 98/70 (!) 89/61  Pulse:    Resp: (!) 22 17  Temp:    SpO2:     Physical Exam: Abdomen soft and nontender.  VAC dressing intact in the midline.  Femoral incisions healing with 2-3+ femoral pulses.  Palpable dorsalis pedis pulses.  CBC    Component Value Date/Time   WBC 14.8 (H) 05/27/2018 0927   RBC 3.12 (L) 05/27/2018 0927   HGB 9.5 (L) 05/27/2018 0927   HGB 15.8 01/23/2018 1218   HCT 29.1 (L) 05/27/2018 0927   HCT 45.9 01/23/2018 1218   PLT 394 05/27/2018 0927   PLT 253 01/23/2018 1218   MCV 93.3 05/27/2018 0927   MCV 93 01/23/2018 1218   MCH 30.4 05/27/2018 0927   MCHC 32.6 05/27/2018 0927   RDW 15.5 05/27/2018 0927   RDW 13.7 01/23/2018 1218   LYMPHSABS 1.1 05/07/2018 1802   LYMPHSABS 2.8 01/23/2018 1218   MONOABS 0.3 04/26/2018 1802   EOSABS 0.0 04/26/2018 1802   EOSABS 0.1 01/23/2018 1218   BASOSABS 0.0 04/20/2018 1802   BASOSABS 0.1 01/23/2018 1218    BMET    Component Value Date/Time   NA 139 05/28/2018 0610   NA 137 01/23/2018 1218   K 5.2 (H) 05/28/2018 0610   CL 102 05/28/2018 0610   CO2 21 (L) 05/28/2018 0610   GLUCOSE 171 (H) 05/28/2018 0610   BUN 101 (H) 05/28/2018 0610   BUN 11 01/23/2018 1218   CREATININE 5.02 (H) 05/28/2018 0610   CREATININE 0.66 06/18/2015 1855   CALCIUM 8.8 (L) 05/28/2018 0610   GFRNONAA 9 (L) 05/28/2018 0610   GFRNONAA >89 01/31/2014 1532   GFRAA 10 (L) 05/28/2018 0610   GFRAA >89 01/31/2014 1532    INR    Component Value Date/Time   INR 1.4 (H) 05/12/2018 0415     Intake/Output Summary (Last 24 hours) at 05/28/2018 0806 Last data filed at 05/28/2018 0700 Gross per 24 hour  Intake 1003.04 ml  Output 1165 ml   Net -161.96 ml     Assessment/Plan:  63 y.o. female post aortobifemoral bypass with probable atheroemboli to get and kidneys.  Status post bowel resection.  For line replacement from CCM and CRRT     Larina Earthly, MD [facility] [phone] 05/28/2018 8:06 AM

## 2018-05-28 NOTE — Progress Notes (Signed)
[facility] ROUNDING NOTE   Subjective:   Brief history this 63 year old lady with a history of aortofemoral bypass, hypotension respiratory failure necrotic bowel requiring exploratory laparotomy, intubated anuric kidney injury requiring CRRT. She was converted to intermittent hemodialysis.  She is still confused.  This is been confusion that has been present for at least 2 weeks.  She developed a respiratory arrest on dialysis 05/27/2018.  She has not been stable enough for intermittent dialysis.  Started CRRT 05/27/2018.  Patient remains confused and combative and has retracted dialysis catheter.  A new dialysis catheter will need to be placed in order for her to continue with CRRT Neurology was consulted.  EEG showed background slowing consistent with encephalopathy or dementia.  CT scan showed motion degraded examination with left thalamus infarct versus motion and artifact moderate chronic small vessel ischemic changes old frontal MCA territory infarct.  Appreciate input from neurology.  Blood pressure 107/94 pulse 35 temperature 97.9  1350 cc of stool 05/26/2018.  250 cc of urine 05/27/2018.  She continues to be confused with perseveration.  She continues to be oliguric due to hypotension no fluid was removed during dialysis treatment 05/25/2018.  Continues to pass copious amounts of stool  Sodium 139 potassium 5.2 chloride 102 CO2 21 BUN 101 serum creatinine 5.02 glucose 171 calcium 8.8 phosphorus 9.2 albumin 2.0  Atorvastatin 80 mg daily insulin sliding scale Protonix 40 mg IV daily,, loperamide 4 mg 3 times daily   Objective:  Vital signs in last 24 hours:  Temp:  [96.5 F (35.8 C)-98.5 F (36.9 C)] 97.9 F (36.6 C) (03/15 0403) Pulse Rate:  [28-134] 30 (03/15 0800) Resp:  [16-32] 21 (03/15 0800) BP: (83-168)/(47-153) 100/55 (03/15 0800) SpO2:  [88 %-100 %] 98 % (03/15 0800) Weight:  [55.9 kg] 55.9 kg (03/15 0500)  Weight change: -5.336 kg Filed Weights    05/26/18 0426 05/27/18 0450 05/28/18 0500  Weight: 58.5 kg 61.2 kg 55.9 kg    Intake/Output: I/O last 3 completed shifts: In: 1013 [I.V.:23; NG/GT:940; IV Piggyback:50] Out: 2365 [Urine:250; Drains:50; Other:215; Stool:1850]   Intake/Output this shift:  Total I/O In: 50 [NG/GT:50] Out: -    Confused wriggling in bed CVS- RRR regular rate and rhythm RS- CTA no wheezes or rales ABD- BS present soft non-distended ex lap incision site clean dry dressing colostomy EXT-no edema   Basic Metabolic Panel: Recent Labs  Lab 05/23/18 1439 05/24/18 0833 05/25/18 0714 05/27/18 0927 05/27/18 1659 05/28/18 0610  NA 149* 141 141 138 137 139  K 4.7 3.7 3.8 4.8 4.2 5.2*  CL 122* 103 103 98 103 102  CO2 15* 25 23 18* 22 21*  GLUCOSE 155* 169* 178* 196* 150* 171*  BUN 115* 49* 81* 131* 68* 101*  CREATININE 5.38* 3.29* 4.80* 6.66* 3.32* 5.02*  CALCIUM 8.6* 8.4* 8.9 9.0 8.4* 8.8*  MG  --  1.7  --   --   --  2.2  PHOS 7.9*  --  9.4* >30.0* 5.5* 9.2*    Liver Function Tests: Recent Labs  Lab 05/23/18 1439 05/25/18 0714 05/27/18 0927 05/27/18 1659 05/28/18 0610  ALBUMIN 1.9* 2.0* 2.2* 2.0* 2.0*   No results for input(s): LIPASE, AMYLASE in the last 168 hours. Recent Labs  Lab 05/26/18 1042  AMMONIA 32    CBC: Recent Labs  Lab 05/22/18 0414 05/23/18 1439 05/24/18 0833 05/25/18 0714 05/27/18 0927  WBC 13.1* 15.1* 16.2* 15.3* 14.8*  HGB 8.5* 8.8* 9.0* 9.3* 9.5*  HCT 27.1* 27.3* 27.8*  27.8* 29.1*  MCV 96.1 97.5 93.3 93.3 93.3  PLT 197 268 245 269 394    Cardiac Enzymes: No results for input(s): CKTOTAL, CKMB, CKMBINDEX, TROPONINI in the last 168 hours.  BNP: Invalid input(s): POCBNP  CBG: Recent Labs  Lab 05/27/18 1611 05/27/18 2009 05/28/18 0102 05/28/18 0358 05/28/18 0801  GLUCAP 135* 150* 151* 174* 169*    Microbiology: Results for orders placed or performed during the hospital encounter of 2018-05-27  Culture, blood (Routine X 2) w Reflex to ID Panel      Status: None   Collection Time: 05/12/18  3:00 AM  Result Value Ref Range Status   Specimen Description BLOOD LEFT ARM  Final   Special Requests   Final    BOTTLES DRAWN AEROBIC AND ANAEROBIC Blood Culture adequate volume   Culture   Final    NO GROWTH 5 DAYS Performed at [facility], [address] [place], [place] [address]    Report Status 05/17/2018 FINAL  Final  Culture, blood (Routine X 2) w Reflex to ID Panel     Status: None   Collection Time: 05/12/18  3:20 AM  Result Value Ref Range Status   Specimen Description BLOOD LEFT HAND  Final   Special Requests   Final    BOTTLES DRAWN AEROBIC ONLY Blood Culture adequate volume   Culture   Final    NO GROWTH 5 DAYS Performed at [facility], [address] [place], [place] [address]    Report Status 05/17/2018 FINAL  Final  Culture, blood (Routine X 2) w Reflex to ID Panel     Status: None (Preliminary result)   Collection Time: 05/26/18 10:33 AM  Result Value Ref Range Status   Specimen Description BLOOD RIGHT ANTECUBITAL  Final   Special Requests   Final    BOTTLES DRAWN AEROBIC AND ANAEROBIC Blood Culture adequate volume   Culture   Final    NO GROWTH 1 DAY Performed at [facility], [address], [place], [place] [address]    Report Status PENDING  Incomplete  Culture, blood (Routine X 2) w Reflex to ID Panel     Status: None (Preliminary result)   Collection Time: 05/26/18 10:40 AM  Result Value Ref Range Status   Specimen Description BLOOD LEFT ANTECUBITAL  Final   Special Requests   Final    BOTTLES DRAWN AEROBIC ONLY Blood Culture adequate volume   Culture   Final    NO GROWTH 1 DAY Performed at [facility], [address], [place], [place] [address]    Report Status PENDING  Incomplete    Coagulation Studies: No results for input(s): LABPROT, INR in the last 72 hours.  Urinalysis: No results for input(s): COLORURINE, LABSPEC, PHURINE, GLUCOSEU, HGBUR, BILIRUBINUR, KETONESUR,  PROTEINUR, UROBILINOGEN, NITRITE, LEUKOCYTESUR in the last 72 hours.  Invalid input(s): APPERANCEUR    Imaging: Ct Head Wo Contrast  Result Date: 05/26/2018 CLINICAL DATA:  Altered mental status. History of hypertension, hyperlipidemia and diabetes. EXAM: CT HEAD WITHOUT CONTRAST TECHNIQUE: Contiguous axial images were obtained from the base of the skull through the vertex without intravenous contrast. COMPARISON:  CT HEAD May 12, 2018. FINDINGS: Mild motion degraded examination. BRAIN: No intraparenchymal hemorrhage, mass effect nor midline shift. No parenchymal brain volume loss for age. No hydrocephalus. Patchy supratentorial white matter hypodensities. RIGHT posterior frontal encephalomalacia. Small LEFT thalamus hypodensity. No acute large vascular territory infarcts. No abnormal extra-axial fluid collections. Basal cisterns are  patent. VASCULAR: Moderate to severe calcific atherosclerosis of the carotid siphons. SKULL: No skull fracture. No significant scalp soft tissue swelling. SINUSES/ORBITS: Trace paranasal sinus mucosal thickening. Minimal RIGHT mastoid effusion.The included ocular globes and orbital contents are non-suspicious. OTHER: Nasogastric tube via LEFT nares. IMPRESSION: 1. Motion degraded examination. LEFT thalamus infarct versus motion artifact. 2. Moderate chronic small vessel ischemic changes. Old RIGHT frontal/MCA territory infarct. Electronically Signed   By: Awilda Metroourtnay  Bloomer M.D.   On: 05/26/2018 15:39   Dg Chest Port 1 View  Result Date: 05/27/2018 CLINICAL DATA:  Complication with hemodialysis catheter. EXAM: PORTABLE CHEST 1 VIEW COMPARISON:  05/25/2018 FINDINGS: Left central venous catheter has apparently retracted since previous study with tip now projected over the thoracic inlet, likely in the lower jugular or upper brachiocephalic vein. No pneumothorax. Enteric tube tip is off the field of view but well below the left hemidiaphragm, probably in the distal stomach  or small bowel. Shallow inspiration. Heart size and pulmonary vascularity are normal. Slight interstitial pattern to the lungs may indicate fibrosis. No airspace consolidation. No blunting of costophrenic angles. Calcification of the aorta. IMPRESSION: Left central venous catheter has retracted since previous study with tip now projected over the thoracic inlet, likely in the lower jugular or upper brachiocephalic vein. No pneumothorax. Shallow inspiration with fibrosis in the lungs. Electronically Signed   By: Burman NievesWilliam  Stevens M.D.   On: 05/27/2018 19:29     Medications:   .  prismasol BGK 4/2.5 500 mL/hr at 05/27/18 1400  .  prismasol BGK 4/2.5 500 mL/hr at 05/27/18 1400  . sodium chloride 10 mL/hr at 05/27/18 1900  . sodium chloride    . sodium chloride    . sodium chloride    . sodium chloride    . feeding supplement (VITAL AF 1.2 CAL) 50 mL/hr at 05/27/18 0949  . prismasol BGK 4/2.5 2,000 mL/hr at 05/27/18 1645  . thiamine injection Stopped (05/27/18 1741)   . atorvastatin  80 mg Per Tube Daily  . Chlorhexidine Gluconate Cloth  6 each Topical Daily  . Chlorhexidine Gluconate Cloth  6 each Topical Q0600  . Chlorhexidine Gluconate Cloth  6 each Topical Q0600  . diphenoxylate-atropine  5 mL Per Tube TID  . feeding supplement (PRO-STAT SUGAR FREE 64)  30 mL Per Tube TID  . Gerhardt's butt cream   Topical BID  . insulin aspart  0-9 Units Subcutaneous Q4H  . loperamide HCl  4 mg Per Tube TID  . mouth rinse  15 mL Mouth Rinse BID  . multivitamin  1 tablet Oral QHS  . octreotide  50 mcg Intravenous Q8H  . pantoprazole  40 mg Oral Daily  . sodium chloride flush  10-40 mL Intracatheter Q12H   sodium chloride, sodium chloride, sodium chloride, sodium chloride, sodium chloride, acetaminophen, [DISCONTINUED] acetaminophen **OR** acetaminophen, alteplase, alteplase, fentaNYL (SUBLIMAZE) injection, heparin, heparin, heparin, labetalol, lidocaine (PF), lidocaine-prilocaine, LORazepam,  ondansetron, pentafluoroprop-tetrafluoroeth, phenol, sodium chloride, sodium chloride flush  Assessment/ Plan:   Oliguric acute kidney injury presumably secondary to acute tubular necrosis from hypotension and possibly sepsis necrotic bowel status post exploratory laparotomy with colostomy placement.  She was on CRRT from 2/29/2022 to 05/18/2018 with a temporary dialysis catheter placed in the left IJ.  Patient was seen during dialysis 05/27/2018.  This was another complicated dialysis treatment now with respiratory distress.  She does not seem to be tolerating hemodialysis.Marland Kitchen.  Her BUN is greater than 100 this may be contributing to her mental status change.  She  is now pulled out her hemodialysis catheter.  She will need this replaced and we can continue CRRT.  Hyperkalemia.  We will plan CRRT once dialysis catheter placed  Acute respiratory failure mechanical ventilation followed by pulmonology and critical care patient now extubated and stable  History of septic shock due to ischemic and necrotic bowel now off pressors  Peripheral artery disease status post aorto bifemoral bypass 06/07/2018  Ischemic bowel status post small bowel resection and right and left colectomy with colostomy  Hypotension.  Will discontinue IV Lopressor  History of thrombocytopenia no heparin with dialysis  High output through ostomy.  Continue to follow may need stool bulking agents will defer to general surgery  Confusion and encephalopathy.  It appears that she has some encephalopathy probably related to uremia and under dialysis will proceed with CRRT. CT scan showed motion degraded examination with left thalamus infarct versus motion and artifact moderate chronic small vessel ischemic changes old frontal MCA territory infarct.  Appreciate input from neurology.    LOS: 18 Garnetta Buddy  :08 AM

## 2018-05-28 NOTE — Progress Notes (Signed)
EEG was consistent with a diffuse encephalopathy. No electrographic seizures were noted.   MRI has not yet been performed. If patient improves with further dialysis sessions, may be able to defer MRI to outpatient setting.   Neurology will sign off. Please call if there are additional questions.   Electronically signed: Dr. Caryl Pina

## 2018-05-28 NOTE — Progress Notes (Signed)
NAME:  Gwendolyn Pham, MRN:  [id number], DOB:  [birth year], LOS: 18 ADMISSION DATE:  04/23/2018, CONSULTATION DATE:  05/27/2018 REFERRING MD:  Hyman Hopes - Nephrology, CHIEF COMPLAINT:  Acute renal failure.   HPI/course in hospital  63 year old woman with complicated course following aorto-bifemoral bypass performed 2/26 for severe claudication with aortic occlusion.  Complicated course thereafter by ischemic bowel with resection of distal small bowel and partial colectomy with eventual closure of the abdomen after a multistep surgery on 2/29. She has developed AKI and required HD. She has poor would healing. She has had increasing hypotension and has been unable to tolerate IHD. As such she has become increasingly confused due to uremia.   3/14 she sustained a brief cardiac arrest on initiation of HD.   Interim history/subjective:  Pulled dialysis catheter overnight and now CRRT is off  Objective   Blood pressure (!) 107/94, pulse (!) 32, temperature 97.7 F (36.5 C), temperature source Oral, resp. rate 20, height 5\' 4"  (1.626 m), weight 55.9 kg, SpO2 100 %.        Intake/Output Summary (Last 24 hours) at 05/28/2018 1008 Last data filed at 05/28/2018 0800 Gross per 24 hour  Intake 1053.04 ml  Output 965 ml  Net 88.04 ml   Filed Weights   05/26/18 0426 05/27/18 0450 05/28/18 0500  Weight: 58.5 kg 61.2 kg 55.9 kg    Examination: General: Acutely ill appearing female, mild respiratory distress HEENT: [place]/AT, PERRL, EOM-I and MMM Heart: IRIR, Nl S1/S2 and -M/R/G Lung: coarse BS diffusely Abdomen: Soft, NT, ND and +BS Ext: -edema and -tenderness Skin: intact, dressing over where HD catheter used to be on the left IJ site Neuro: confused but awake, following simple commands and protecting her airway  I reviewed CXR myself, mild pulmonary edema noted  Ancillary tests (personally reviewed)  CBC: Recent Labs  Lab 05/22/18 0414 05/23/18 1439 05/24/18 0833 05/25/18 0714 05/27/18 0927   WBC 13.1* 15.1* 16.2* 15.3* 14.8*  HGB 8.5* 8.8* 9.0* 9.3* 9.5*  HCT 27.1* 27.3* 27.8* 27.8* 29.1*  MCV 96.1 97.5 93.3 93.3 93.3  PLT 197 268 245 269 394   Basic Metabolic Panel: Recent Labs  Lab 05/23/18 1439 05/24/18 0833 05/25/18 0714 05/27/18 0927 05/27/18 1659 05/28/18 0610  NA 149* 141 141 138 137 139  K 4.7 3.7 3.8 4.8 4.2 5.2*  CL 122* 103 103 98 103 102  CO2 15* 25 23 18* 22 21*  GLUCOSE 155* 169* 178* 196* 150* 171*  BUN 115* 49* 81* 131* 68* 101*  CREATININE 5.38* 3.29* 4.80* 6.66* 3.32* 5.02*  CALCIUM 8.6* 8.4* 8.9 9.0 8.4* 8.8*  MG  --  1.7  --   --   --  2.2  PHOS 7.9*  --  9.4* >30.0* 5.5* 9.2*   GFR: Estimated Creatinine Clearance: 9.9 mL/min (A) (by C-G formula based on SCr of 5.02 mg/dL (H)). Recent Labs  Lab 05/23/18 1439 05/24/18 0833 05/25/18 0714 05/27/18 0927  WBC 15.1* 16.2* 15.3* 14.8*    Liver Function Tests: Recent Labs  Lab 05/23/18 1439 05/25/18 0714 05/27/18 0927 05/27/18 1659 05/28/18 0610  ALBUMIN 1.9* 2.0* 2.2* 2.0* 2.0*   No results for input(s): LIPASE, AMYLASE in the last 168 hours. Recent Labs  Lab 05/26/18 1042  AMMONIA 32   ABG    Component Value Date/Time   PHART 7.496 (H) 05/18/2018 0405   PCO2ART 32.6 05/18/2018 0405   PO2ART 98.3 05/18/2018 0405   HCO3 25.0 05/18/2018 0405  TCO2 26 05/16/2018 0344   ACIDBASEDEF 5.0 (H) 04/29/2018 0927   O2SAT 97.4 05/18/2018 0405   Coagulation Profile: No results for input(s): INR, PROTIME in the last 168 hours.  Cardiac Enzymes: No results for input(s): CKTOTAL, CKMB, CKMBINDEX, TROPONINI in the last 168 hours.  HbA1C: Hgb A1c MFr Bld  Date/Time Value Ref Range Status  05/02/2018 10:28 AM 6.4 (H) 4.8 - 5.6 % Final    Comment:    (NOTE)         Prediabetes: 5.7 - 6.4         Diabetes: >6.4         Glycemic control for adults with diabetes: <7.0   01/23/2018 12:18 PM 6.4 (H) 4.8 - 5.6 % Final    Comment:             Prediabetes: 5.7 - 6.4          Diabetes:  >6.4          Glycemic control for adults with diabetes: <7.0     CBG: Recent Labs  Lab 05/27/18 1611 05/27/18 2009 05/28/18 0102 05/28/18 0358 05/28/18 0801  GLUCAP 135* 150* 151* 174* 169*   Assessment & Plan:   Acute encephalopathy:  - Minimize sedation  - EEG noted  - Needs MRI when able to cooperate or gets intubated  Acute renal failure:  - Replace HD catheter  - Restart CRRT  - BMET in AM  - Replace electrolytes as indicated  - KVO IVF  Respiratory failure:  - Monitor for airway protection  - May need to be intubated before HD catheter placement  - Insert cortrak in AM when service is available  HTN:  - Monitor for now  - No anti-HTN meds yet  PCCM will continue to manage  The patient is critically ill with multiple organ systems failure and requires high complexity decision making for assessment and support, frequent evaluation and titration of therapies, application of advanced monitoring technologies and extensive interpretation of multiple databases.   Critical Care Time devoted to patient care services described in this note is  33  Minutes. This time reflects time of care of this signee Dr Koren Bound. This critical care time does not reflect procedure time, or teaching time or supervisory time of PA/NP/Med student/Med Resident etc but could involve care discussion time.  Alyson Reedy, M.D. [facility] Pulmonary/Critical Care Medicine. Pager: [phone]. After hours pager: [phone].

## 2018-05-28 NOTE — Procedures (Signed)
Central Venous Catheter Insertion Procedure Note BREELAN CURBOW [id number] [birth year]  Procedure: Insertion of Central Venous Catheter Indications: Dialysis access  Procedure Details Consent: Risks of procedure as well as the alternatives and risks of each were explained to the (patient/caregiver).  Consent for procedure obtained. Time Out: Verified patient identification, verified procedure, site/side was marked, verified correct patient position, special equipment/implants available, medications/allergies/relevent history reviewed, required imaging and test results available.  Performed  Maximum sterile technique was used including antiseptics, cap, gloves, gown, hand hygiene, mask and sheet. Skin prep: Chlorhexidine; local anesthetic administered A antimicrobial bonded/coated triple lumen catheter was placed in the right internal jugular vein using the Seldinger technique.  Evaluation Blood flow good Complications: No apparent complications Patient did tolerate procedure well. Chest X-ray ordered to verify placement.  CXR: pending.  U/S used in placement  YACOUB,WESAM 05/28/2018, 2:32 PM

## 2018-05-29 DIAGNOSIS — K559 Vascular disorder of intestine, unspecified: Secondary | ICD-10-CM

## 2018-05-29 LAB — GLUCOSE, CAPILLARY
GLUCOSE-CAPILLARY: 144 mg/dL — AB (ref 70–99)
Glucose-Capillary: 104 mg/dL — ABNORMAL HIGH (ref 70–99)
Glucose-Capillary: 138 mg/dL — ABNORMAL HIGH (ref 70–99)
Glucose-Capillary: 145 mg/dL — ABNORMAL HIGH (ref 70–99)
Glucose-Capillary: 148 mg/dL — ABNORMAL HIGH (ref 70–99)
Glucose-Capillary: 153 mg/dL — ABNORMAL HIGH (ref 70–99)
Glucose-Capillary: 167 mg/dL — ABNORMAL HIGH (ref 70–99)

## 2018-05-29 LAB — RENAL FUNCTION PANEL
Albumin: 2.1 g/dL — ABNORMAL LOW (ref 3.5–5.0)
Albumin: 1.9 g/dL — ABNORMAL LOW (ref 3.5–5.0)
Anion gap: 11 (ref 5–15)
Anion gap: 7 (ref 5–15)
BUN: 42 mg/dL — ABNORMAL HIGH (ref 8–23)
BUN: 22 mg/dL (ref 8–23)
CHLORIDE: 104 mmol/L (ref 98–111)
CO2: 22 mmol/L (ref 22–32)
CO2: 25 mmol/L (ref 22–32)
Calcium: 8.5 mg/dL — ABNORMAL LOW (ref 8.9–10.3)
Calcium: 8 mg/dL — ABNORMAL LOW (ref 8.9–10.3)
Chloride: 102 mmol/L (ref 98–111)
Creatinine, Ser: 2.26 mg/dL — ABNORMAL HIGH (ref 0.44–1.00)
Creatinine, Ser: 1.06 mg/dL — ABNORMAL HIGH (ref 0.44–1.00)
GFR calc Af Amer: 26 mL/min — ABNORMAL LOW (ref >60)
GFR calc Af Amer: >60 mL/min (ref >60)
GFR calc non Af Amer: 22 mL/min — ABNORMAL LOW (ref >60)
GFR calc non Af Amer: 56 mL/min — ABNORMAL LOW (ref >60)
GLUCOSE: 172 mg/dL — AB (ref 70–99)
Glucose, Bld: 177 mg/dL — ABNORMAL HIGH (ref 70–99)
PHOSPHORUS: 4 mg/dL (ref 2.5–4.6)
Phosphorus: 2.3 mg/dL — ABNORMAL LOW (ref 2.5–4.6)
Potassium: 4.6 mmol/L (ref 3.5–5.1)
Potassium: 4.4 mmol/L (ref 3.5–5.1)
Sodium: 135 mmol/L (ref 135–145)
Sodium: 136 mmol/L (ref 135–145)

## 2018-05-29 LAB — POCT ACTIVATED CLOTTING TIME
ACTIVATED CLOTTING TIME: 158 s
ACTIVATED CLOTTING TIME: 164 s
ACTIVATED CLOTTING TIME: 208 s
Activated Clotting Time: 136 seconds
Activated Clotting Time: 136 seconds
Activated Clotting Time: 164 seconds
Activated Clotting Time: 175 seconds
Activated Clotting Time: 175 seconds
Activated Clotting Time: 180 seconds
Activated Clotting Time: 175 seconds
Activated Clotting Time: 180 seconds
Activated Clotting Time: 186 seconds
Activated Clotting Time: 186 seconds

## 2018-05-29 LAB — MAGNESIUM: Magnesium: 2.2 mg/dL (ref 1.7–2.4)

## 2018-05-29 MED ORDER — SODIUM CHLORIDE 0.9 % IV SOLN
INTRAVENOUS | Status: AC
  Administered 2018-05-29: 08:00:00 via INTRAVENOUS

## 2018-05-29 MED ORDER — OCTREOTIDE ACETATE 50 MCG/ML IJ SOLN
50.0000 ug | Freq: Two times a day (BID) | INTRAMUSCULAR | Status: DC
  Administered 2018-05-29 – 2018-05-30 (×2): 50 ug via INTRAVENOUS
  Filled 2018-05-29 (×3): qty 1

## 2018-05-29 MED ORDER — SODIUM CHLORIDE 0.9 % IV SOLN
250.0000 [IU]/h | INTRAVENOUS | Status: DC
  Administered 2018-05-29: 1200 [IU]/h via INTRAVENOUS_CENTRAL
  Administered 2018-05-29: 450 [IU]/h via INTRAVENOUS_CENTRAL
  Administered 2018-05-30 (×2): 1000 [IU]/h via INTRAVENOUS_CENTRAL
  Filled 2018-05-29: qty 1
  Filled 2018-05-29 (×6): qty 2

## 2018-05-29 MED ORDER — PANTOPRAZOLE SODIUM 40 MG PO PACK
40.0000 mg | PACK | Freq: Every day | ORAL | Status: DC
  Administered 2018-05-30 – 2018-06-03 (×5): 40 mg
  Filled 2018-05-29 (×5): qty 20

## 2018-05-29 MED ORDER — HEPARIN BOLUS VIA INFUSION (CRRT)
1000.0000 [IU] | INTRAVENOUS | Status: DC | PRN
  Filled 2018-05-29: qty 1000

## 2018-05-29 NOTE — Progress Notes (Signed)
Subjective: Continues to be encephalopathic  Exam: Vitals:   05/29/18 1345 05/29/18 1400  BP: 97/66 (!) 99/47  Pulse: (!) 30   Resp: 18 17  Temp:    SpO2: 95%    Gen: In bed, on Precedex Resp: non-labored breathing, no acute distress Abd: soft, nt  Neuro: MS: Opens eyes to minor stimulation, she does follow commands and answers what her name is. CN: Pupils are reactive bilaterally, she does not fixate or track,   She resists eye opening when trying to check blink to threat in vision. Motor: She moves all extremities spontaneously Sensory: Endorses sensation bilaterally  Pertinent Labs: Ammonia 32- 3/13   Impression: 63 year old female with likely toxic/metabolic encephalopathy leading to ICU delirium.  Currently, she becomes quite agitated when Precedex is lifted requiring sedation and restraints.  I do think that an MRI would continue to be useful and therefore would favor attempting to obtain this if possible.  Recommendations: 1) continue to recommend MRI 2) high-dose thiamine has already been ordered, therefore I do not feel a level will be helpful. 3) continue to minimize sedating medications as possible and continue supportive measures.  Ritta Slot, MD [facility] [phone]  If 7pm- 7am, please page neurology on call as listed in AMION.

## 2018-05-29 NOTE — Progress Notes (Signed)
   NAME:  Gwendolyn Pham, MRN:  [id number], DOB:  Aug 28, 1955, LOS: 19 ADMISSION DATE:  05/06/2018, CONSULTATION DATE:  05/27/2018 REFERRING MD:  Hyman Hopes - Nephrology, CHIEF COMPLAINT:  Acute renal failure.   HPI/course in hospital  63 year old woman with complicated course following aorto-bifemoral bypass performed 2/26 for severe claudication with aortic occlusion.  Complicated course thereafter by ischemic bowel with resection of distal small bowel and partial colectomy with eventual closure of the abdomen after a multistep surgery on 2/29. She has developed AKI and required HD. She has poor would healing. She had hypotension and did not tolerate IHD & developed confusion due to uremia.   3/14 she sustained a brief cardiac arrest on initiation of HD.   RIJ HD cath 3/15 >>  Interim history/subjective:  Remains clear ill, confused on Precedex On CRRT Does not have Foley presumably anuric    Objective   Blood pressure 108/79, pulse 63, temperature (!) 96.9 F (36.1 C), temperature source Axillary, resp. rate 16, height 5\' 4"  (1.626 m), weight 57 kg, SpO2 100 %.        Intake/Output Summary (Last 24 hours) at 05/29/2018 1138 Last data filed at 05/29/2018 1100 Gross per 24 hour  Intake 2330.79 ml  Output 2257 ml  Net 73.79 ml   Filed Weights   05/27/18 0450 05/28/18 0500 05/29/18 0500  Weight: 61.2 kg 55.9 kg 57 kg    Examination: General: Acutely ill appearing female, mild agitation on CRRT HEENT: [place]/AT, PERRL, EOM-I and MMM Heart: IRIR, Nl S1/S2 and -M/R/G Lung: Decreased breath sounds bilateral Abdomen: Soft, NT, ND and +BS, wound VAC has purulent fluid but no new fluid in tubing, ostomy output noted Ext: -edema and -tenderness Skin: intact, no bleeding from catheter site Neuro: confused on Precedex, RA SS +1   Assessment & Plan:   Acute encephalopathy:  Head CT 3/13 old right frontal/MCA infarct ,?  Small left thalamus infarct versus artifact EEG 3/13?  Triphasic waves  more prominent on the left versus artifact  -Continue Precedex, goal RA SS 0 - Needs MRI when able to cooperate or gets intubated -Seen by neurology, last 3/15               AKI : - Back on CRRT    Ischemic  Bowel -status post right and left colon resection, small bowel resection 2/27 with left end colostomy 2/29 and ileocolonic anastomosis  -Postop management of ostomy and wound VAC per surgery -Off antibiotics  Diabetes type 2-SSI -Continue tube feeds.  Updated sister at bedside.  She does request mechanical ventilation if needed but would not want long-term life support.  She seems surprised when I mention the possibility of long-term dialysis  The patient is critically ill with multiple organ systems failure and requires high complexity decision making for assessment and support, frequent evaluation and titration of therapies, application of advanced monitoring technologies and extensive interpretation of multiple databases. Critical Care Time devoted to patient care services described in this note independent of APP/resident  time is 33 minutes.   Cyril Mourning MD. Tonny Bollman. [facility] Pager [phone] If no response call [phone]   05/29/2018

## 2018-05-29 NOTE — Progress Notes (Signed)
Patient ID: Gwendolyn Pham, female   DOB: [birth year], 63 y.o.   MRN: [id number]  Progress Note    05/29/2018 8:13 AM 16 Days Post-Op  Subjective: Confused but answers questions   Vitals:   05/29/18 0700 05/29/18 0730  BP: (!) 97/53 (!) 81/42  Pulse: (!) 29 (!) 31  Resp: (!) 24 (!) 22  Temp:  (!) 96.9 F (36.1 C)  SpO2: 99% 99%   Physical Exam: Abdomen soft and nontender.  2+ femoral pulses with well-healed femoral incisions  CBC    Component Value Date/Time   WBC 14.8 (H) 05/27/2018 0927   RBC 3.12 (L) 05/27/2018 0927   HGB 9.5 (L) 05/27/2018 0927   HGB 15.8 01/23/2018 1218   HCT 29.1 (L) 05/27/2018 0927   HCT 45.9 01/23/2018 1218   PLT 394 05/27/2018 0927   PLT 253 01/23/2018 1218   MCV 93.3 05/27/2018 0927   MCV 93 01/23/2018 1218   MCH 30.4 05/27/2018 0927   MCHC 32.6 05/27/2018 0927   RDW 15.5 05/27/2018 0927   RDW 13.7 01/23/2018 1218   LYMPHSABS 1.1 05/16/2018 1802   LYMPHSABS 2.8 01/23/2018 1218   MONOABS 0.3 05/16/2018 1802   EOSABS 0.0 May 16, 2018 1802   EOSABS 0.1 01/23/2018 1218   BASOSABS 0.0 May 16, 2018 1802   BASOSABS 0.1 01/23/2018 1218    BMET    Component Value Date/Time   NA 135 05/29/2018 0417   NA 137 01/23/2018 1218   K 4.6 05/29/2018 0417   CL 102 05/29/2018 0417   CO2 22 05/29/2018 0417   GLUCOSE 172 (H) 05/29/2018 0417   BUN 42 (H) 05/29/2018 0417   BUN 11 01/23/2018 1218   CREATININE 2.26 (H) 05/29/2018 0417   CREATININE 0.66 06/18/2015 1855   CALCIUM 8.5 (L) 05/29/2018 0417   GFRNONAA 22 (L) 05/29/2018 0417   GFRNONAA >89 01/31/2014 1532   GFRAA 26 (L) 05/29/2018 0417   GFRAA >89 01/31/2014 1532    INR    Component Value Date/Time   INR 1.4 (H) 05/12/2018 0415     Intake/Output Summary (Last 24 hours) at 05/29/2018 0813 Last data filed at 05/29/2018 0800 Gross per 24 hour  Intake 1935.03 ml  Output 1938 ml  Net -2.97 ml     Assessment/Plan:  63 y.o. female continue support.  Appreciate help of all consultants     Larina Earthly, MD FACS [facility] [phone] 05/29/2018 8:13 AM

## 2018-05-29 NOTE — Progress Notes (Signed)
Subjective:  HD cath replaced and resumption of CRRT yesterday - hemodynamically unstable overnight, pressor dosing increased - some unmeasured UOP- pressures on filter elevated   Objective Vital signs in last 24 hours: Vitals:   05/29/18 0615 05/29/18 0630 05/29/18 0645 05/29/18 0700  BP: (!) 140/93 (!) 101/54 100/68 (!) 97/53  Pulse: 83 (!) 29 (!) 29 (!) 29  Resp: 18 16 19  (!) 24  Temp:      TempSrc:      SpO2: 100% 99% 100% 99%  Weight:      Height:       Weight change: 1.1 kg  Intake/Output Summary (Last 24 hours) at 05/29/2018 1610 Last data filed at 05/29/2018 0700 Gross per 24 hour  Intake 1881.1 ml  Output 1832 ml  Net 49.1 ml    Assessment/ Plan: Pt is a 63 y.o. yo female who was admitted on 04/28/2018 with aortobifem bypass with complication of ischemic bowel with resection and also AKI req HD in some form since 2/29  Assessment/Plan: 1. Renal- AKI as complication of aortobifem in Feb- has been HD dep in some form over the last 17 days- did not tolerate IHD, now back on CRRT - new vascath placed 3/15 after pt pulled other line.  Will add some heparin with CRRT   2. HTN/vol - possibly dry- will bolus outside of machine, total of one liter 3. Anemia- hgb increasing, supportive care  4. Elytes - K 4.6, phos 4.0- mag 2.2- nothing needed for now 5. Neuro- from reports seems better, uremia resolved - EEG unremark- unable to get MRI   Cecille Aver    Labs: Basic Metabolic Panel: Recent Labs  Lab 05/28/18 0610 05/28/18 1541 05/29/18 0417  NA 139 139 135  K 5.2* 5.2* 4.6  CL 102 103 102  CO2 21* 22 22  GLUCOSE 171* 133* 172*  BUN 101* 114* 42*  CREATININE 5.02* 5.54* 2.26*  CALCIUM 8.8* 8.7* 8.5*  PHOS 9.2* 10.8* 4.0   Liver Function Tests: Recent Labs  Lab 05/28/18 0610 05/28/18 1541 05/29/18 0417  ALBUMIN 2.0* 2.0* 2.1*   No results for input(s): LIPASE, AMYLASE in the last 168 hours. Recent Labs  Lab 05/26/18 1042  AMMONIA 32    CBC: Recent Labs  Lab 05/23/18 1439 05/24/18 0833 05/25/18 0714 05/27/18 0927  WBC 15.1* 16.2* 15.3* 14.8*  HGB 8.8* 9.0* 9.3* 9.5*  HCT 27.3* 27.8* 27.8* 29.1*  MCV 97.5 93.3 93.3 93.3  PLT 268 245 269 394   Cardiac Enzymes: No results for input(s): CKTOTAL, CKMB, CKMBINDEX, TROPONINI in the last 168 hours. CBG: Recent Labs  Lab 05/28/18 1138 05/28/18 1536 05/28/18 1951 05/28/18 2358 05/29/18 0359  GLUCAP 122* 119* 134* 144* 153*    Iron Studies: No results for input(s): IRON, TIBC, TRANSFERRIN, FERRITIN in the last 72 hours. Studies/Results: Dg Chest Port 1 View  Result Date: 05/28/2018 CLINICAL DATA:  Malfunctioning central catheter EXAM: PORTABLE CHEST 1 VIEW COMPARISON:  May 27, 2018 FINDINGS: Previous left-sided central catheter has been removed. Central catheter tip placed from the right side is in the superior vena cava. Feeding tube tip and side port below the diaphragm. No pneumothorax. There is no edema or consolidation. Heart size and pulmonary vascularity are normal. No adenopathy. There is aortic atherosclerosis. No bone lesions. IMPRESSION: Tube and catheter positions as described without pneumothorax. No edema or consolidation. Heart size normal. Aortic Atherosclerosis (ICD10-I70.0). Electronically Signed   By: Bretta Bang III M.D.   On: 05/28/2018 15:10  Dg Chest Port 1 View  Result Date: 05/27/2018 CLINICAL DATA:  Complication with hemodialysis catheter. EXAM: PORTABLE CHEST 1 VIEW COMPARISON:  05/25/2018 FINDINGS: Left central venous catheter has apparently retracted since previous study with tip now projected over the thoracic inlet, likely in the lower jugular or upper brachiocephalic vein. No pneumothorax. Enteric tube tip is off the field of view but well below the left hemidiaphragm, probably in the distal stomach or small bowel. Shallow inspiration. Heart size and pulmonary vascularity are normal. Slight interstitial pattern to the lungs may  indicate fibrosis. No airspace consolidation. No blunting of costophrenic angles. Calcification of the aorta. IMPRESSION: Left central venous catheter has retracted since previous study with tip now projected over the thoracic inlet, likely in the lower jugular or upper brachiocephalic vein. No pneumothorax. Shallow inspiration with fibrosis in the lungs. Electronically Signed   By: Burman Nieves M.D.   On: 05/27/2018 19:29   Medications: Infusions: .  prismasol BGK 4/2.5 500 mL/hr at 05/29/18 0219  .  prismasol BGK 4/2.5 500 mL/hr at 05/29/18 0219  . sodium chloride Stopped (05/28/18 1207)  . dexmedetomidine (PRECEDEX) IV infusion 1 mcg/kg/hr (05/29/18 0700)  . feeding supplement (VITAL AF 1.2 CAL) 1,000 mL (05/29/18 0413)  . norepinephrine (LEVOPHED) Adult infusion 7 mcg/min (05/29/18 0700)  . prismasol BGK 4/2.5 2,000 mL/hr at 05/29/18 0411  . thiamine injection Stopped (05/28/18 1048)    Scheduled Medications: . atorvastatin  80 mg Per Tube Daily  . Chlorhexidine Gluconate Cloth  6 each Topical Daily  . diphenoxylate-atropine  5 mL Per Tube TID  . feeding supplement (PRO-STAT SUGAR FREE 64)  30 mL Per Tube TID  . Gerhardt's butt cream   Topical BID  . insulin aspart  0-9 Units Subcutaneous Q4H  . loperamide HCl  4 mg Per Tube TID  . mouth rinse  15 mL Mouth Rinse BID  . multivitamin  1 tablet Oral QHS  . octreotide  50 mcg Intravenous Q8H  . pantoprazole  40 mg Oral Daily  . sodium chloride flush  10-40 mL Intracatheter Q12H    have reviewed scheduled and prn medications.  Physical Exam: General: alert, can follow commands but also some agitation that is not directable- req restraints  Heart: tachy  Lungs: mostly clear Abdomen: soft Extremities: no edema- no stigmata of CES on toes  Dialysis Access: right IJ vascath placed 3/15    05/29/2018,7:29 AM  LOS: 19 days

## 2018-05-29 NOTE — Care Management (Signed)
05/29/2018 Pt started back on CRRT.  CIR decline pt due to lack of sustainable discharge plan.  [facility] department following for possible SNF or LTACH placement once appropriate.

## 2018-05-29 NOTE — Progress Notes (Signed)
CSW received a call from pt's RN asking if there is a social work template detailing that a patient is in the hospital and cannot make decisions on their own (for HCPOA purposes) to be signed by the provider.  CSW is unaware of such a letter and suggested the chaplain who handled HCPOA might.  Please reconsult if future social work needs arise.  CSW signing off, as social work intervention is no longer needed.  Gwendolyn Pham. Levert Heslop, LCSW, LCAS, CSI Transitions of Care Clinical Social Worker Care Coordination Department Ph: [phone]

## 2018-05-29 NOTE — Progress Notes (Signed)
  Speech Language Pathology Treatment: Dysphagia  Patient Details Name: Gwendolyn Pham MRN: [id number] DOB: June 22, 1955 Today's Date: 05/29/2018 Time: 7035-0093 SLP Time Calculation (min) (ACUTE ONLY): 10 min  Assessment / Plan / Recommendation Clinical Impression  Chart review indicates code over the weekend, with transfer to 25M. RN reports pt continues to be confused, but has appeared to tolerate ice chip trials. Family present, asking appropriate questions regarding failed MBS and subsequent treatment, current NG feeding tube and concern for nasal irritation. SLP provided education - reviewed MBS results and explained short term design of cortrak, goal of SLP to continue assessment for po readiness, and pending decisions regarding longer term feeding options if po intake is not feasible. SLP will continue to follow at bedside.   HPI HPI: Pt is a 63 y.o. female who was admitted 2/26 for aorto femoral bypass. 2/27 had hypotension and respiratory distress. Intubated that night, fould to be in septic shock, underwent emergent exploratory laparotomy, small bowel resection, bilateral colectomy. Extubated on 3/5 in am. PMH: HTN, HLD, DM, depression, COPD, asthma, anxiety.  New CXR 3/12 following MBSS with results pending      SLP Plan  Continue with current plan of care       Recommendations  Diet recommendations: NPO Medication Administration: Via alternative means                Oral Care Recommendations: Oral care QID Follow up Recommendations: Inpatient Rehab SLP Visit Diagnosis: Dysphagia, oropharyngeal phase (R13.12) Plan: Continue with current plan of care       Karoline Fleer B. Murvin [facility], CCC-SLP Speech Language Pathologist [phone]  Gwendolyn Pham 05/29/2018, 10:12 AM

## 2018-05-29 NOTE — Progress Notes (Signed)
Inpatient Rehab Admissions:  Inpatient Rehab Consult received.  I met with pt's sister, Vincente Liberty, at the bedside for rehabilitation assessment and to discuss goals and expectations of an inpatient rehab admission.  Adrienne not able to provide 24/7 assist, which patient is projected to need at d/c from CIR stay.  She does not want pt to be considered for [facility] admission.  Notified RNCM/Samantha.  Will sign off at this time.    Signed: Shann Medal, PT, DPT Admissions Coordinator [phone] 05/29/18  12:05 PM

## 2018-05-29 NOTE — Progress Notes (Signed)
16 Days Post-Op  Subjective: CC: Patient moved from 4E to 74M over the weekend. Appears patient had an episode of aganol breathing around the time of HD on 3/14. Received CPR and bagged breathing. No intubations  Her pressures have been soft over the weekend and she has been started on Norepi as well as fluids at 154ml/hr. A central venous catheter has been placed and she was restarted on CRRT.   Today, family is at bedside. Patient is still confused but when asked about pain she denies any. Specifically no abdominal pain, N/V. Colostomy with 600-650 output per day over the weekend. Nurse reports some leakage again.   Objective: Vital signs in last 24 hours: Temp:  [94.6 F (34.8 C)-98.5 F (36.9 C)] 96.9 F (36.1 C) (03/16 0730) Pulse Rate:  [28-110] 31 (03/16 0730) Resp:  [6-33] 22 (03/16 0730) BP: (56-169)/(31-132) 81/42 (03/16 0730) SpO2:  [92 %-100 %] 99 % (03/16 0730) Weight:  [57 kg] 57 kg (03/16 0500) Last BM Date: 05/28/18(colostomy)  Intake/Output from previous day: 03/15 0701 - 03/16 0700 In: 1881.1 [I.V.:531.1; NG/GT:1300; IV Piggyback:50] Out: 1832 [Stool:600] Intake/Output this shift: Total I/O In: 103.9 [I.V.:53.9; NG/GT:50] Out: 106 [Other:106]  PE: Gen: frail, ill appearing, NAD, eyes closed but easily open to voice. Mentation intact.  Card: Tachycardic (appears episodes of bradycardia and tachycardia on vitals) Lungs: Normal effort Abd: Soft, non-distended, +BS, stomabudded and viable with os at 6 o'clock.The colostomy bag is sitting horizontally. There appears to have been some leakage. Belt is no longer on. There is gas and light brown/yellow liquid stool in colostomy bag. 600cc output recorded overnight.Midline wound vac in place.Abdomen is soft without obvious tenderness. No peritonitis. Msk: no edema, DP intake b/l Skin: warm and dry  Lab Results:  Recent Labs    05/27/18 0927  WBC 14.8*  HGB 9.5*  HCT 29.1*  PLT 394   BMET Recent Labs     05/28/18 1541 05/29/18 0417  NA 139 135  K 5.2* 4.6  CL 103 102  CO2 22 22  GLUCOSE 133* 172*  BUN 114* 42*  CREATININE 5.54* 2.26*  CALCIUM 8.7* 8.5*   PT/INR No results for input(s): LABPROT, INR in the last 72 hours. CMP     Component Value Date/Time   NA 135 05/29/2018 0417   NA 137 01/23/2018 1218   K 4.6 05/29/2018 0417   CL 102 05/29/2018 0417   CO2 22 05/29/2018 0417   GLUCOSE 172 (H) 05/29/2018 0417   BUN 42 (H) 05/29/2018 0417   BUN 11 01/23/2018 1218   CREATININE 2.26 (H) 05/29/2018 0417   CREATININE 0.66 06/18/2015 1855   CALCIUM 8.5 (L) 05/29/2018 0417   PROT 5.5 (L) 05/18/2018 0356   PROT 7.2 01/23/2018 1218   ALBUMIN 2.1 (L) 05/29/2018 0417   ALBUMIN 4.3 01/23/2018 1218   AST 222 (H) 05/18/2018 0356   ALT 172 (H) 05/18/2018 0356   ALKPHOS 154 (H) 05/18/2018 0356   BILITOT 2.0 (H) 05/18/2018 0356   BILITOT 0.2 01/23/2018 1218   GFRNONAA 22 (L) 05/29/2018 0417   GFRNONAA >89 01/31/2014 1532   GFRAA 26 (L) 05/29/2018 0417   GFRAA >89 01/31/2014 1532   Lipase  No results found for: LIPASE     Studies/Results: Dg Chest Port 1 View  Result Date: 05/28/2018 CLINICAL DATA:  Malfunctioning central catheter EXAM: PORTABLE CHEST 1 VIEW COMPARISON:  May 27, 2018 FINDINGS: Previous left-sided central catheter has been removed. Central catheter tip placed from the  right side is in the superior vena cava. Feeding tube tip and side port below the diaphragm. No pneumothorax. There is no edema or consolidation. Heart size and pulmonary vascularity are normal. No adenopathy. There is aortic atherosclerosis. No bone lesions. IMPRESSION: Tube and catheter positions as described without pneumothorax. No edema or consolidation. Heart size normal. Aortic Atherosclerosis (ICD10-I70.0). Electronically Signed   By: Bretta Bang III M.D.   On: 05/28/2018 15:10   Dg Chest Port 1 View  Result Date: 05/27/2018 CLINICAL DATA:  Complication with hemodialysis catheter.  EXAM: PORTABLE CHEST 1 VIEW COMPARISON:  05/25/2018 FINDINGS: Left central venous catheter has apparently retracted since previous study with tip now projected over the thoracic inlet, likely in the lower jugular or upper brachiocephalic vein. No pneumothorax. Enteric tube tip is off the field of view but well below the left hemidiaphragm, probably in the distal stomach or small bowel. Shallow inspiration. Heart size and pulmonary vascularity are normal. Slight interstitial pattern to the lungs may indicate fibrosis. No airspace consolidation. No blunting of costophrenic angles. Calcification of the aorta. IMPRESSION: Left central venous catheter has retracted since previous study with tip now projected over the thoracic inlet, likely in the lower jugular or upper brachiocephalic vein. No pneumothorax. Shallow inspiration with fibrosis in the lungs. Electronically Signed   By: Burman Nieves M.D.   On: 05/27/2018 19:29    Anti-infectives: Anti-infectives (From admission, onward)   Start     Dose/Rate Route Frequency Ordered Stop   05/19/18 0900  meropenem (MERREM) 500 mg in sodium chloride 0.9 % 100 mL IVPB     500 mg 200 mL/hr over 30 Minutes Intravenous  Once 05/18/18 0956 05/19/18 0825   05/17/18 1130  vancomycin (VANCOCIN) IVPB 750 mg/150 ml premix  Status:  Discontinued     750 mg 150 mL/hr over 60 Minutes Intravenous Every 24 hours 05/17/18 0733 05/17/18 0919   05/16/18 0900  vancomycin (VANCOCIN) IVPB 750 mg/150 ml premix  Status:  Discontinued     750 mg 150 mL/hr over 60 Minutes Intravenous Every 24 hours 05/16/18 0745 05/17/18 0733   05/15/18 0800  vancomycin (VANCOCIN) IVPB 750 mg/150 ml premix  Status:  Discontinued     750 mg 150 mL/hr over 60 Minutes Intravenous Every 24 hours 05/14/18 0916 05/16/18 0745   06/09/2018 2200  meropenem (MERREM) 1 g in sodium chloride 0.9 % 100 mL IVPB  Status:  Discontinued     1 g 200 mL/hr over 30 Minutes Intravenous Every 12 hours Jun 09, 2018 1714  05/18/18 0956   05/12/18 1115  vancomycin (VANCOCIN) 1,250 mg in sodium chloride 0.9 % 250 mL IVPB     1,250 mg 166.7 mL/hr over 90 Minutes Intravenous  Once 05/12/18 1109 05/12/18 1348   05/12/18 1115  meropenem (MERREM) 500 mg in sodium chloride 0.9 % 100 mL IVPB  Status:  Discontinued     500 mg 200 mL/hr over 30 Minutes Intravenous Every 12 hours 05/12/18 1109 06/09/18 1714   05/12/18 1109  vancomycin variable dose per unstable renal function (pharmacist dosing)  Status:  Discontinued      Does not apply See admin instructions 05/12/18 1109 05/14/18 0900   04/15/2018 1800  vancomycin (VANCOCIN) IVPB 1000 mg/200 mL premix     1,000 mg 200 mL/hr over 60 Minutes Intravenous Every 12 hours 04/20/2018 1357 04/18/2018 1759   05/05/2018 0540  vancomycin (VANCOCIN) IVPB 1000 mg/200 mL premix     1,000 mg 200 mL/hr over 60 Minutes Intravenous 60  min pre-op 04/22/2018 0540 04/23/2018 1504       Assessment/Plan  s/pProcedure(s): EXPLORATORY LAPAROTOMY ILEOCOLONIC ANASTOMOS CLOSURE OF ABDOMEN (N/A) COLOSTOMY (N/A)  AIOD with aortic occlusion - S/PAortobifemoral bypass wth 14 x 8 Hemashield graft, 04/17/2018, Dr. Arbie Cookey  Ischemic distal small bowel, right and left colon ischemia: - S/Pex lap with right and left colon resection, small bowel resection 2/27 Magnus Ivan)- S/Pileo-transverse colon anastomosis and left end colostomy 2/29 Janee Morn) - Midline with wound vacin place. Will see this with WOC nurse today.  - Leakage around colostomy. Colostomy now horizontal and without belt. Friday night when placed vertically with a belt in place very little to no leakage. Will have it placed this way for all future changed. Discussed with patient's nurse. Will see with WOC today.  -High output from colostomy. Goal 1L/24 hours. 650 on 3/14-3/15 and 600cc 3/15-3/16. On Lomotil TID, ImodiumTID and Octreotide. Titrate appropriately for output.  -Electrolytes and fluid management per nephrology. Now on 125ml/hr  with CRRT. She is net positivecurrently. Has had persistent hypotension this weekend now on Norepi. Important to watch I/O since hypovolemia can trigger ischemic bowel.   FEN:TF's, IVF, CRRT, NPO VTE: SCD's,heparin for CRRT WG:[id number] 03/04, Merremonce 03/06,WBCup to 14.8(3/14), monitor, afebrile. CBC AM Follow up:TBD    LOS: 19 days    Jacinto Halim , [facility] 05/29/2018, 8:11 AM Pager: [phone]

## 2018-05-30 DIAGNOSIS — R570 Cardiogenic shock: Secondary | ICD-10-CM

## 2018-05-30 LAB — CBC
HCT: 24 % — ABNORMAL LOW (ref 36.0–46.0)
HEMATOCRIT: 24.4 % — AB (ref 36.0–46.0)
HEMOGLOBIN: 7.5 g/dL — AB (ref 12.0–15.0)
Hemoglobin: 7.5 g/dL — ABNORMAL LOW (ref 12.0–15.0)
MCH: 29.8 pg (ref 26.0–34.0)
MCH: 29.6 pg (ref 26.0–34.0)
MCHC: 31.3 g/dL (ref 30.0–36.0)
MCHC: 30.7 g/dL (ref 30.0–36.0)
MCV: 95.2 fL (ref 80.0–100.0)
MCV: 96.4 fL (ref 80.0–100.0)
Platelets: 256 10*3/uL (ref 150–400)
Platelets: 245 10*3/uL (ref 150–400)
RBC: 2.52 MIL/uL — AB (ref 3.87–5.11)
RBC: 2.53 MIL/uL — ABNORMAL LOW (ref 3.87–5.11)
RDW: 15.2 % (ref 11.5–15.5)
RDW: 15.4 % (ref 11.5–15.5)
WBC: 11.8 10*3/uL — ABNORMAL HIGH (ref 4.0–10.5)
WBC: 12.9 10*3/uL — ABNORMAL HIGH (ref 4.0–10.5)
nRBC: 0 % (ref 0.0–0.2)
nRBC: 0.2 % (ref 0.0–0.2)

## 2018-05-30 LAB — RENAL FUNCTION PANEL
ALBUMIN: 1.8 g/dL — AB (ref 3.5–5.0)
Albumin: 1.8 g/dL — ABNORMAL LOW (ref 3.5–5.0)
Anion gap: 5 (ref 5–15)
Anion gap: 8 (ref 5–15)
BUN: 16 mg/dL (ref 8–23)
BUN: 15 mg/dL (ref 8–23)
CHLORIDE: 104 mmol/L (ref 98–111)
CO2: 26 mmol/L (ref 22–32)
CO2: 24 mmol/L (ref 22–32)
Calcium: 8 mg/dL — ABNORMAL LOW (ref 8.9–10.3)
Calcium: 7.9 mg/dL — ABNORMAL LOW (ref 8.9–10.3)
Chloride: 105 mmol/L (ref 98–111)
Creatinine, Ser: 0.85 mg/dL (ref 0.44–1.00)
Creatinine, Ser: 0.96 mg/dL (ref 0.44–1.00)
GFR calc Af Amer: >60 mL/min (ref >60)
GFR calc Af Amer: >60 mL/min (ref >60)
GFR calc non Af Amer: >60 mL/min (ref >60)
GFR calc non Af Amer: >60 mL/min (ref >60)
GLUCOSE: 165 mg/dL — AB (ref 70–99)
Glucose, Bld: 144 mg/dL — ABNORMAL HIGH (ref 70–99)
PHOSPHORUS: 1.4 mg/dL — AB (ref 2.5–4.6)
POTASSIUM: 4.4 mmol/L (ref 3.5–5.1)
Phosphorus: 2 mg/dL — ABNORMAL LOW (ref 2.5–4.6)
Potassium: 4.5 mmol/L (ref 3.5–5.1)
Sodium: 135 mmol/L (ref 135–145)
Sodium: 137 mmol/L (ref 135–145)

## 2018-05-30 LAB — POCT ACTIVATED CLOTTING TIME
ACTIVATED CLOTTING TIME: 213 s
Activated Clotting Time: 224 seconds
Activated Clotting Time: 224 seconds
Activated Clotting Time: 230 seconds
Activated Clotting Time: 230 seconds
Activated Clotting Time: 230 seconds
Activated Clotting Time: 213 seconds
Activated Clotting Time: 224 seconds

## 2018-05-30 LAB — MAGNESIUM: Magnesium: 2.3 mg/dL (ref 1.7–2.4)

## 2018-05-30 LAB — GLUCOSE, CAPILLARY
Glucose-Capillary: 102 mg/dL — ABNORMAL HIGH (ref 70–99)
Glucose-Capillary: 121 mg/dL — ABNORMAL HIGH (ref 70–99)
Glucose-Capillary: 124 mg/dL — ABNORMAL HIGH (ref 70–99)
Glucose-Capillary: 145 mg/dL — ABNORMAL HIGH (ref 70–99)
Glucose-Capillary: 155 mg/dL — ABNORMAL HIGH (ref 70–99)
Glucose-Capillary: 166 mg/dL — ABNORMAL HIGH (ref 70–99)

## 2018-05-30 LAB — APTT: aPTT: >200 seconds (ref 24–36)

## 2018-05-30 MED ORDER — ORAL CARE MOUTH RINSE
15.0000 mL | Freq: Two times a day (BID) | OROMUCOSAL | Status: DC
  Administered 2018-05-30 – 2018-06-03 (×10): 15 mL via OROMUCOSAL

## 2018-05-30 MED ORDER — DARBEPOETIN ALFA 100 MCG/0.5ML IJ SOSY
100.0000 ug | PREFILLED_SYRINGE | INTRAMUSCULAR | Status: DC
  Administered 2018-05-30: 100 ug via SUBCUTANEOUS
  Filled 2018-05-30: qty 0.5

## 2018-05-30 MED ORDER — OCTREOTIDE ACETATE 20 MG IM KIT: 50.0000 mg | PACK | Freq: Two times a day (BID) | INTRAMUSCULAR | Status: DC

## 2018-05-30 MED ORDER — OCTREOTIDE ACETATE 50 MCG/ML IJ SOLN
50.0000 ug | Freq: Two times a day (BID) | INTRAMUSCULAR | Status: DC
  Administered 2018-05-30 – 2018-06-01 (×4): 50 ug via INTRAVENOUS
  Filled 2018-05-30 (×4): qty 1

## 2018-05-30 MED ORDER — MIDAZOLAM HCL 2 MG/2ML IJ SOLN
2.0000 mg | Freq: Once | INTRAMUSCULAR | Status: AC | PRN
  Administered 2018-05-31: 2 mg via INTRAVENOUS
  Filled 2018-05-30: qty 2

## 2018-05-30 MED ORDER — LORAZEPAM 2 MG/ML IJ SOLN
1.0000 mg | Freq: Four times a day (QID) | INTRAMUSCULAR | Status: DC | PRN
  Administered 2018-05-30: 1 mg via INTRAVENOUS
  Administered 2018-05-30: 2 mg via INTRAVENOUS
  Administered 2018-05-30: 1 mg via INTRAVENOUS
  Filled 2018-05-30 (×3): qty 1

## 2018-05-30 MED ORDER — CHLORHEXIDINE GLUCONATE 0.12 % MT SOLN
15.0000 mL | Freq: Two times a day (BID) | OROMUCOSAL | Status: DC
  Administered 2018-05-30 – 2018-06-03 (×9): 15 mL via OROMUCOSAL
  Filled 2018-05-30 (×2): qty 15

## 2018-05-30 MED ORDER — WHITE PETROLATUM EX OINT
TOPICAL_OINTMENT | CUTANEOUS | Status: AC
  Administered 2018-05-30: 0.2
  Filled 2018-05-30: qty 28.35

## 2018-05-30 NOTE — Progress Notes (Signed)
Nutrition Follow-up  DOCUMENTATION CODES:   Not applicable  INTERVENTION:   Continue TF via Cortrak:  Vital AF 1.2 at 50 ml/h   Pro-stat 30 ml TID  Provides 1740 kcal, 135 gm protein, 973 ml free water daily  NUTRITION DIAGNOSIS:   Inadequate oral intake related to dysphagia as evidenced by NPO status.  Ongoing  GOAL:   Patient will meet greater than or equal to 90% of their needs  Met with TF  MONITOR:   TF tolerance, Diet advancement, Labs, Skin, Weight trends, I & O's  ASSESSMENT:   Pt with PMH T2DM, COPD, HTN, HLD and depression. Presented to [facility] for aortobifemoral bypass procedure on 2/26. Found to have extensive necrotic bowel which was all resected 2/27.   2/26 s/p aortobifemoral bypass for aortoiliac occlusive disease w/aortic occlusion 2/27 intubated for respiratory insufficiency 2/27 s/p exp lap; found ischemic bowel/colon 2/29 s/p ileocolonic anastomosis, abd closure & colostomy  3/02 started trickle feeds via OGT 3/04 Cortrak placed; tip in stomach 3/05 extubated  CRRT being stopped today. Remains confused. Receiving precedex. SLP following for ability to complete swallow evaluation. Remains NPO at this time. Patient is tolerating TF well. Vital AF 1.2 a 50 ml/h with pro-stat 30 ml TID via Cortrak, meeting nutrition needs.   Labs reviewed. Phosphorus 1.4 (L) CBG's: [phone] Medications reviewed and include novolog, immodium, rena-vit, octreotide.   Diet Order:   Diet Order            Diet NPO time specified  Diet effective now              EDUCATION NEEDS:   Not appropriate for education at this time  Skin:  Skin Assessment: Skin Integrity Issues: Skin Integrity Issues:: Other (Comment) Incisions: Abdomen, groin Other: MASD to coccyx & sacrum  Last BM:  600 ml output from colostomy 3/16  Height:   Ht Readings from Last 1 Encounters:  04/15/2018 _0  (1.626 m)    Weight:   Wt Readings from Last 1 Encounters:  05/30/18  56.4 kg    Ideal Body Weight:  56.8 kg  BMI:  Body mass index is 21.34 kg/m.  Estimated Nutritional Needs:   Kcal:  1700-1900  Protein:  125-140 gm  Fluid:  per MD    Molli Barrows, RD, LDN, [place] Pager [phone] After Hours Pager [phone]

## 2018-05-30 NOTE — Consult Note (Addendum)
WOC Nurse ostomy follow up Stoma type/location: LUQ colostomy Stomal assessment/size: 1 and 1/8 inch x 1 and 5/8 inch oval with minimal elevation at proximal aspect. Flush at distal aspect.  Os at 6 o'clock. Peristomal assessment: intact. Erythema at lateral edge, 3-5 o'clock. PMASD. Treatment options for stomal/peristomal skin: Skin barrier ring Output: Tan thin effluent Ostomy pouching: 1pc.flexible convex with skin barrier ring  Education provided: None. Patient in ICU and with dementia. Patient's sister is not with her today. Requires 4-point restraints for her safety as she flails her arms and legs about. She is currently on HD. Patient has Network engineer, but is not currently wearing them; recommend bilateral heel silicone foam dressings for those times when her boots are off to prevent pressure injury. Enrolled patient in [place] Secure Start Discharge program: No  There are 4 pouches in room.  I will order 4 corresponding barrier rings for pouch changes.  Next visit is due tomorrow for routine NPWT dressing change with pouch change likely due to proximity of wound to stoma.  Thanks, Ladona Mow, MSN, RN, GNP, [place]  Pager# [phone]

## 2018-05-30 NOTE — Progress Notes (Signed)
PT Cancellation Note  Patient Details Name: Gwendolyn Pham MRN: [id number] DOB: Jul 23, 1955   Cancelled Treatment:    Reason Eval/Treat Not Completed: Other (comment).  Per RN, pt not following commands, in 4 point restraints, not appropriate today.  PT to follow.  Thanks,  Rollene Rotunda. Ercie Eliasen, PT, DPT  Acute Rehabilitation [phone] pager [phone]) [phone] office     Lurena Joiner B Anntonette [place]well 05/30/2018, 1:18 PM

## 2018-05-30 NOTE — Progress Notes (Signed)
CRITICAL VALUE ALERT  Critical Value:  PTT >200  Date & Time Notied:  05/30/18 05/30/18  Provider Notified: Pola Corn

## 2018-05-30 NOTE — Progress Notes (Signed)
Subjective:  Remains on pressors/CRRT - no UOP recorded - remains encephalopathic- to get MRI  Objective Vital signs in last 24 hours: Vitals:   05/30/18 0615 05/30/18 0630 05/30/18 0646 05/30/18 0700  BP: (!) 148/76 (!) 99/59 (!) 83/49 133/71  Pulse: 73 74 75 73  Resp: 18 19 16 17   Temp:      TempSrc:      SpO2: 100% 100% 100% 100%  Weight:      Height:       Weight change: -0.6 kg  Intake/Output Summary (Last 24 hours) at 05/30/2018 0741 Last data filed at 05/30/2018 0700 Gross per 24 hour  Intake 3323.8 ml  Output 2202 ml  Net 1121.8 ml    Assessment/ Plan: Pt is a 63 y.o. yo female who was admitted on 05/09/2018 with aortobifem bypass with complication of ischemic bowel with resection and also AKI req HD in some form since 2/29  Assessment/Plan: 1. Renal- AKI as complication of aortobifem in Feb- has been HD dep in some form over the last 18 days- did not tolerate IHD, now back on CRRT- resumed 3/15 - new vascath placed 3/15 after pt pulled other line.  Added heparin with CRRT.  Given that her numbers are SO good, I want to hold CRRT again today and see what she does   2. HTN/vol - possibly dry- bolused outside of machine yest, total of one liter- no difference with pressors 3. Anemia- hgb increasing as of 3/14- down 2 grams today but is over a 3 day period.  Initially I was concerned about the heparin in CRRT causing an issue - am going to stop it today anyway- add ESA  4. Elytes - K 4.5, phos 2.0- mag 2.3- since am stopping CRRT feel like phos will inc on own  5. Neuro- uremia resolved but still encephalopathic - EEG unremark- to get MRI, neuro following    Gwendolyn Pham    Labs: Basic Metabolic Panel: Recent Labs  Lab 05/29/18 0417 05/29/18 1834 05/30/18 0452  NA 135 136 135  K 4.6 4.4 4.5  CL 102 104 104  CO2 22 25 26   GLUCOSE 172* 177* 165*  BUN 42* 22 16  CREATININE 2.26* 1.06* 0.85  CALCIUM 8.5* 8.0* 8.0*  PHOS 4.0 2.3* 2.0*   Liver Function  Tests: Recent Labs  Lab 05/29/18 0417 05/29/18 1834 05/30/18 0452  ALBUMIN 2.1* 1.9* 1.8*   No results for input(s): LIPASE, AMYLASE in the last 168 hours. Recent Labs  Lab 05/26/18 1042  AMMONIA 32   CBC: Recent Labs  Lab 05/23/18 1439 05/24/18 0833 05/25/18 0714 05/27/18 0927 05/30/18 0452  WBC 15.1* 16.2* 15.3* 14.8* 11.8*  HGB 8.8* 9.0* 9.3* 9.5* 7.5*  HCT 27.3* 27.8* 27.8* 29.1* 24.0*  MCV 97.5 93.3 93.3 93.3 95.2  PLT 268 245 269 394 256   Cardiac Enzymes: No results for input(s): CKTOTAL, CKMB, CKMBINDEX, TROPONINI in the last 168 hours. CBG: Recent Labs  Lab 05/29/18 1136 05/29/18 1515 05/29/18 1943 05/29/18 2336 05/30/18 0356  GLUCAP 104* 167* 148* 145* 155*    Iron Studies: No results for input(s): IRON, TIBC, TRANSFERRIN, FERRITIN in the last 72 hours. Studies/Results: Dg Chest Port 1 View  Result Date: 05/28/2018 CLINICAL DATA:  Malfunctioning central catheter EXAM: PORTABLE CHEST 1 VIEW COMPARISON:  May 27, 2018 FINDINGS: Previous left-sided central catheter has been removed. Central catheter tip placed from the right side is in the superior vena cava. Feeding tube tip and side port below  the diaphragm. No pneumothorax. There is no edema or consolidation. Heart size and pulmonary vascularity are normal. No adenopathy. There is aortic atherosclerosis. No bone lesions. IMPRESSION: Tube and catheter positions as described without pneumothorax. No edema or consolidation. Heart size normal. Aortic Atherosclerosis (ICD10-I70.0). Electronically Signed   By: Bretta Bang III M.D.   On: 05/28/2018 15:10   Medications: Infusions: .  prismasol BGK 4/2.5 500 mL/hr at 05/30/18 0001  .  prismasol BGK 4/2.5 500 mL/hr at 05/29/18 2354  . sodium chloride 10 mL/hr at 05/30/18 0700  . dexmedetomidine (PRECEDEX) IV infusion Stopped (05/30/18 5852)  . feeding supplement (VITAL AF 1.2 CAL) 1,000 mL (05/30/18 0257)  . heparin 10,000 units/ 20 mL infusion syringe 950  Units/hr (05/30/18 0700)  . norepinephrine (LEVOPHED) Adult infusion 8 mcg/min (05/30/18 0700)  . prismasol BGK 4/2.5 2,000 mL/hr at 05/30/18 0644  . thiamine injection Stopped (05/29/18 1024)    Scheduled Medications: . atorvastatin  80 mg Per Tube Daily  . Chlorhexidine Gluconate Cloth  6 each Topical Daily  . diphenoxylate-atropine  5 mL Per Tube TID  . feeding supplement (PRO-STAT SUGAR FREE 64)  30 mL Per Tube TID  . Gerhardt's butt cream   Topical BID  . insulin aspart  0-9 Units Subcutaneous Q4H  . loperamide HCl  4 mg Per Tube TID  . mouth rinse  15 mL Mouth Rinse BID  . multivitamin  1 tablet Oral QHS  . octreotide  50 mcg Intravenous Q12H  . pantoprazole sodium  40 mg Per Tube Daily  . sodium chloride flush  10-40 mL Intracatheter Q12H    have reviewed scheduled and prn medications.  Physical Exam: General: alert, pleasantly confused Heart: tachy  Lungs: mostly clear Abdomen: soft Extremities: no edema- no stigmata of CES on toes  Dialysis Access: right IJ vascath placed 3/15    05/30/2018,7:41 AM  LOS: 20 days

## 2018-05-30 NOTE — Progress Notes (Signed)
NAME:  Gwendolyn Pham, MRN:  [id number], DOB:  1956/02/18, LOS: 47 ADMISSION DATE:  04/27/2018, CONSULTATION DATE:  05/27/2018 REFERRING MD:  Justin Mend - Nephrology, CHIEF COMPLAINT:  Acute renal failure.   HPI/course in hospital  64 year old woman with complicated course following aorto-bifemoral bypass performed 2/26 for severe claudication with aortic occlusion.  Complicated course thereafter by ischemic bowel with resection of distal small bowel and partial colectomy with eventual closure of the abdomen after a multistep surgery on 2/29. She has developed AKI and required HD. She has poor would healing. She had hypotension and did not tolerate IHD & developed confusion due to uremia.   3/14 she sustained a brief cardiac arrest on initiation of HD.   RIJ HD cath 3/15 >> CVVHD initiated  Interim history/subjective:  Remains clear ill, confused on Precedex gtt , and Ativan prn On CRRT, plan to discontinue 3/17 to see if renal function responds Does not have Foley presumably anuric Stool output < goal of 1 L per day x last 3 days    Objective   Blood pressure 105/73, pulse 73, temperature (!) 96.9 F (36.1 C), temperature source Axillary, resp. rate 14, height 5' 4"  (1.626 m), weight 56.4 kg, SpO2 100 %.        Intake/Output Summary (Last 24 hours) at 05/30/2018 0940 Last data filed at 05/30/2018 0900 Gross per 24 hour  Intake 3577.73 ml  Output 2150 ml  Net 1427.73 ml   Filed Weights   05/28/18 0500 05/29/18 0500 05/30/18 0457  Weight: 55.9 kg 57 kg 56.4 kg    Examination: General: Acutely ill appearing female, mild agitation on CRRT, precedex gtt HEENT: [place]/AT, PERRL, EOM-I and MMM, No LAD Heart: IRIR, Nl S1/S2 and -M/R/G Lung: Bilateral chest excursion, Decreased breath sounds bilaterally Abdomen: Soft, NT, ND and +BS, wound VAC has purulent fluid but no new fluid in tubing, ostomy output noted, pink stoma noted Ext: -edema and -tenderness, cool to touch, on BEAR hugger  Skin: intact, no bleeding from catheter site Neuro: confused on Precedex, RA SS +1   Assessment & Plan:   Acute encephalopathy:  Head CT 3/13 old right frontal/MCA infarct ,?  Small left thalamus infarct versus artifact EEG 3/13?  Triphasic waves more prominent on the left versus artifact Neuro want MRI brain when off CRRT  - Continue Precedex, goal RASS 0, minimize as able - Fentanyl for pain - Will add 1 time dose of versed for MRI - Needs MRI when able to cooperate or gets intubated, and off CRRT - Seen by neurology, last 3/17 - Continue Thiamine repletion through 3/18 ( 5 days )               AKI : HD dep in some form since 2/29/ /2020 - did not tolerate IHD, now back on CRRT- resumed 3/15  Creatinine continues to improve on CRRT Net negative 5 L Hypophos Plan - Will discontinue 3/17 to see if renal function responds - Trend BMET - Replete phos and lytes per renal - Rest per renal   Ischemic  Bowel -status post right and left colon resection, small bowel resection 2/27 with left end colostomy 2/29 and ileocolonic anastomosis Stoma looks pink Plan: -Postop management of ostomy and wound VAC per wound care>> Surgery has signed off, will " follow from a distance" - Off antibiotics - Tolerating TF - Remains on  lomotil, octreotide, immodium for output management - Output 600 cc's last 24 ( Goal less than 1  L /24 hours) - She has met goal for the last 3 days - Will d/c octreotide and see if patient continues to meet goal. - Should try to discontinue as patient meets goal of < 1 L per day  Diabetes type 2-SSI - CBG Q 4 - SSI -Continue tube feeds.  Leukocytosis Slowly improving off antibiotics Plan Trend CBC Culture as is clinically indicated   Anemia  Drop in HGB from 9.5-7.5 in last 36 hours She is net negative 5 L, so could be more significant drop 1600 cc from wound vac last 24 Plan Repeat CBC Check Anemia panel Start ESA Transfuse for HGB less than 7  Monitor for any signs of obvious bleeding Hold Heparin>> SCD's ordered    No family at bedside 3/17  She does request mechanical ventilation if needed but would not want long-term life support.  She seems surprised when I mention the possibility of long-term dialysis  The patient is critically ill with multiple organ systems failure and requires high complexity decision making for assessment and support, frequent evaluation and titration of therapies, application of advanced monitoring technologies and extensive interpretation of multiple databases. Critical Care Time devoted to patient care services described in this note independent of APP/resident  time is 33 minutes.   Magdalen Spatz, AGACNP-BC [place] Pager # [phone] If no response call [phone]   05/30/2018  9:40 AM

## 2018-05-30 NOTE — Progress Notes (Signed)
63 year old woman post aortobifemoral bypass surgery 2/26.  Course complicated by ischemic bowel requiring partial colectomy and resection of small bowel with colostomy and AKI requiring CRRT.  She remains critically ill, encephalopathic over the past few days, on low-dose Levophed which has been decreased to 5 mics.  Hypothermic on CRRT She is on Precedex at 1.2, follows one-step commands and is able to tell me her name and date of birth, decreased movement of left arm, remains anuric.  Chest x-ray personally reviewed which appears clear without infiltrates.  Labs show normal electrolytes except for low phosphorus, decreasing leukocytosis and drop in hemoglobin to 7.5.  Impression/plan Acute encephalopathy-no evidence of seizures, head CT showed old right frontal infarct and was suspicious for thalamic infarct, need MRI to further evaluate which will be obtained once CRRT is turned off today. Okay to use Versed x1 for MRI. Meanwhile continue Precedex with goal RA SS 0, okay to use Ativan for breakthrough agitation.  AKI-CRRT to stop later today and we will see if there is any renal recovery.  Ischemic bowel status post colostomy-appreciate wound care assistance, off antibiotics, output is high and is on Imodium, continue tube feeds.  Diabetes type 2-SSI  Updated sister in detail 3/16 who indicated that short-term intubation would be okay but she would not want prolonged mechanical ventilation.  ReMains full code for now  The patient is critically ill with multiple organ systems failure and requires high complexity decision making for assessment and support, frequent evaluation and titration of therapies, application of advanced monitoring technologies and extensive interpretation of multiple databases. Critical Care Time devoted to patient care services described in this note independent of APP/resident  time is 35 minutes.   Comer Locket Vassie Loll MD

## 2018-05-30 NOTE — Progress Notes (Signed)
  Speech Language Pathology Treatment: Dysphagia  Patient Details Name: Gwendolyn Pham MRN: [id number] DOB: November 19, 1955 Today's Date: 05/30/2018 Time: 5993-5701 SLP Time Calculation (min) (ACUTE ONLY): 8 min  Assessment / Plan / Recommendation Clinical Impression  Gwendolyn Pham continues to be very confused and is currently receiving Precedex.  Gwendolyn Pham was lethargic and intermittently followed commands with verbal and tactile cuing. RN had recently provided oral care.  Oral cavity appeared clean and pink.  Dorsal surface of tongue appeared slightly dry suggesting mild xerostomia. Gwendolyn Pham orally excepted trials of ice chips and exhibited bolus manipulation.  At times Gwendolyn Pham required verbal cues to attend to bolus.  Gwendolyn Pham required multiple swallows with all bolus trials, sometimes 4-5 with single ice chip.  There was immediate cough on 1 of 5 trials of ice chips.  Recommend Gwendolyn Pham remain NPO with alternate means of nutrition, hydration, and medication.  SLP to follow for continued PO trials for readiness for instrumental testing and/or diet initiation.    HPI HPI: Gwendolyn Pham is a 63 y.o. female who was admitted 2/26 for aorto femoral bypass. 2/27 had hypotension and respiratory distress. Intubated that night, fould to be in septic shock, underwent emergent exploratory laparotomy, small bowel resection, bilateral colectomy. Extubated on 3/5 in am. PMH: HTN, HLD, DM, depression, COPD, asthma, anxiety. New CXR 3/12 following MBSS with no focal consolidation or infiltrate, and no noted changes in lungs on more recent imaging to assess placement of catheter.  Code blue 3/14 with Gwendolyn Pham transferred to higher level of care.      SLP Plan  Continue with current plan of care       Recommendations  Diet recommendations: NPO Medication Administration: Via alternative means                Oral Care Recommendations: Oral care QID SLP Visit Diagnosis: Dysphagia, oropharyngeal phase (R13.12) Plan: Continue with current plan of care        GO                Kerrie Pleasure, MA, CCC-SLP Acute Rehabilitation Services Office: [phone]; Pager (3/17): [phone] 05/30/2018, 10:02 AM

## 2018-05-30 NOTE — Progress Notes (Signed)
Patient ID: Gwendolyn Pham, female   DOB: [birth year], 63 y.o.   MRN: [id number] Remains confused.  Following simple commands. Abdomen benign. On Levophed and CRRT

## 2018-05-30 NOTE — Progress Notes (Signed)
17 Days Post-Op  Subjective: CC:  Patient remains confused. On precedex. Less leakage from colostomy overnight per nursing staff.   Objective: Vital signs in last 24 hours: Temp:  [94.1 F (34.5 C)-97.6 F (36.4 C)] 96.9 F (36.1 C) (03/17 0741) Pulse Rate:  [26-95] 78 (03/17 0756) Resp:  [0-25] 22 (03/17 0756) BP: (74-148)/(39-111) 98/60 (03/17 0745) SpO2:  [60 %-100 %] 100 % (03/17 0756) Weight:  [56.4 kg] 56.4 kg (03/17 0457) Last BM Date: 05/29/18  Intake/Output from previous day: 03/16 0701 - 03/17 0700 In: 3323.8 [I.V.:2073.8; NG/GT:1200; IV Piggyback:50] Out: 2202 [Stool:600] Intake/Output this shift: No intake/output data recorded.  PE: Gen: frail, ill appearing, NAD, eyes closed but easily open to voice.  Card:Regular  Lungs: Normal effort Abd: Soft, non-distended, +BS, stomabudded and viable with os at 6 o'clock.The colostomy bag is sitting vertically. No significant leakage. There is gas and light brown/yellow liquid stool in colostomy bag. 600cc output recorded overnight.Midline wound vac in place.No surrounding signs of cellulitis. Abdomen is soft without obvious tenderness.No peritonitis. Skin: warm and dry  Lab Results:  Recent Labs    05/27/18 0927 05/30/18 0452  WBC 14.8* 11.8*  HGB 9.5* 7.5*  HCT 29.1* 24.0*  PLT 394 256   BMET Recent Labs    05/29/18 1834 05/30/18 0452  NA 136 135  K 4.4 4.5  CL 104 104  CO2 25 26  GLUCOSE 177* 165*  BUN 22 16  CREATININE 1.06* 0.85  CALCIUM 8.0* 8.0*   PT/INR No results for input(s): LABPROT, INR in the last 72 hours. CMP     Component Value Date/Time   NA 135 05/30/2018 0452   NA 137 01/23/2018 1218   K 4.5 05/30/2018 0452   CL 104 05/30/2018 0452   CO2 26 05/30/2018 0452   GLUCOSE 165 (H) 05/30/2018 0452   BUN 16 05/30/2018 0452   BUN 11 01/23/2018 1218   CREATININE 0.85 05/30/2018 0452   CREATININE 0.66 06/18/2015 1855   CALCIUM 8.0 (L) 05/30/2018 0452   PROT 5.5 (L) 05/18/2018  0356   PROT 7.2 01/23/2018 1218   ALBUMIN 1.8 (L) 05/30/2018 0452   ALBUMIN 4.3 01/23/2018 1218   AST 222 (H) 05/18/2018 0356   ALT 172 (H) 05/18/2018 0356   ALKPHOS 154 (H) 05/18/2018 0356   BILITOT 2.0 (H) 05/18/2018 0356   BILITOT 0.2 01/23/2018 1218   GFRNONAA >60 05/30/2018 0452   GFRNONAA >89 01/31/2014 1532   GFRAA >60 05/30/2018 0452   GFRAA >89 01/31/2014 1532   Lipase  No results found for: LIPASE     Studies/Results: Dg Chest Port 1 View  Result Date: 05/28/2018 CLINICAL DATA:  Malfunctioning central catheter EXAM: PORTABLE CHEST 1 VIEW COMPARISON:  May 27, 2018 FINDINGS: Previous left-sided central catheter has been removed. Central catheter tip placed from the right side is in the superior vena cava. Feeding tube tip and side port below the diaphragm. No pneumothorax. There is no edema or consolidation. Heart size and pulmonary vascularity are normal. No adenopathy. There is aortic atherosclerosis. No bone lesions. IMPRESSION: Tube and catheter positions as described without pneumothorax. No edema or consolidation. Heart size normal. Aortic Atherosclerosis (ICD10-I70.0). Electronically Signed   By: Bretta Bang III M.D.   On: 05/28/2018 15:10    Anti-infectives: Anti-infectives (From admission, onward)   Start     Dose/Rate Route Frequency Ordered Stop   05/19/18 0900  meropenem (MERREM) 500 mg in sodium chloride 0.9 % 100 mL IVPB  500 mg 200 mL/hr over 30 Minutes Intravenous  Once 05/18/18 0956 05/19/18 0825   05/17/18 1130  vancomycin (VANCOCIN) IVPB 750 mg/150 ml premix  Status:  Discontinued     750 mg 150 mL/hr over 60 Minutes Intravenous Every 24 hours 05/17/18 0733 05/17/18 0919   05/16/18 0900  vancomycin (VANCOCIN) IVPB 750 mg/150 ml premix  Status:  Discontinued     750 mg 150 mL/hr over 60 Minutes Intravenous Every 24 hours 05/16/18 0745 05/17/18 0733   05/15/18 0800  vancomycin (VANCOCIN) IVPB 750 mg/150 ml premix  Status:  Discontinued      750 mg 150 mL/hr over 60 Minutes Intravenous Every 24 hours 05/14/18 0916 05/16/18 0745   June 08, 2018 2200  meropenem (MERREM) 1 g in sodium chloride 0.9 % 100 mL IVPB  Status:  Discontinued     1 g 200 mL/hr over 30 Minutes Intravenous Every 12 hours 06-08-18 1714 05/18/18 0956   05/12/18 1115  vancomycin (VANCOCIN) 1,250 mg in sodium chloride 0.9 % 250 mL IVPB     1,250 mg 166.7 mL/hr over 90 Minutes Intravenous  Once 05/12/18 1109 05/12/18 1348   05/12/18 1115  meropenem (MERREM) 500 mg in sodium chloride 0.9 % 100 mL IVPB  Status:  Discontinued     500 mg 200 mL/hr over 30 Minutes Intravenous Every 12 hours 05/12/18 1109 Jun 08, 2018 1714   05/12/18 1109  vancomycin variable dose per unstable renal function (pharmacist dosing)  Status:  Discontinued      Does not apply See admin instructions 05/12/18 1109 05/14/18 0900   04/18/2018 1800  vancomycin (VANCOCIN) IVPB 1000 mg/200 mL premix     1,000 mg 200 mL/hr over 60 Minutes Intravenous Every 12 hours 05/09/2018 1357 05/07/2018 1759   05/08/2018 0540  vancomycin (VANCOCIN) IVPB 1000 mg/200 mL premix     1,000 mg 200 mL/hr over 60 Minutes Intravenous 60 min pre-op 05/07/2018 0540 05/09/2018 1504       Assessment/Plan s/pProcedure(s): EXPLORATORY LAPAROTOMY ILEOCOLONIC ANASTOMOS CLOSURE OF ABDOMEN (N/A) COLOSTOMY (N/A)  AIOD with aortic occlusion - S/PAortobifemoral bypass wth 14 x 8 Hemashield graft, 04/17/2018, Dr. Arbie Cookey  Ischemic distal small bowel, right and left colon ischemia: - S/Pex lap with right and left colon resection, small bowel resection 2/27 Magnus Ivan)- S/Pileo-transverse colon anastomosis and left end colostomy 2/29 Janee Morn) -Midline with wound vacin place. Continue Vac changes - Colostomy with minimal leakage. Keep vertical orientation.  - Colostomy output at goal (<1L/24 hours). Continue Lomotil TID, ImodiumTID and Octreotide. Titrate appropriately for output.  -Electrolytes and fluid management per nephrology - CRRT.  She is net positive currently. Has had persistent hypotension this weekend, on Norepi. Important to watch I/O since hypovolemia can trigger ischemic bowel.  - No further surgical recs, will follow peripherally. Please call for any questions.   FEN:TF's, IVF, CRRT, NPO VTE: SCD's,heparin for CRRT PJ:[id number] 03/04, Merremonce 03/06,WBC downtrending to 11.8(3/17), afebrile  Follow up:TBD   LOS: 20 days    Jacinto Halim , [facility] 05/30/2018, 8:04 AM Pager: [phone]

## 2018-05-30 NOTE — Progress Notes (Signed)
CRITICAL VALUE ALERT  Critical Value:  PTT > 200  Date & Time Notied:  05/30/2018 12:54 PM   Provider Notified: Yes  Orders Received/Actions taken: Awaiting orders

## 2018-05-31 ENCOUNTER — Inpatient Hospital Stay (HOSPITAL_COMMUNITY): Payer: Medicare Other

## 2018-05-31 LAB — CULTURE, BLOOD (ROUTINE X 2)
Culture: NO GROWTH
Culture: NO GROWTH
Special Requests: ADEQUATE
Special Requests: ADEQUATE

## 2018-05-31 LAB — IRON AND TIBC
Iron: 33 ug/dL (ref 28–170)
SATURATION RATIOS: 17 % (ref 10.4–31.8)
TIBC: 199 ug/dL — ABNORMAL LOW (ref 250–450)
UIBC: 166 ug/dL

## 2018-05-31 LAB — CBC
HCT: 22.4 % — ABNORMAL LOW (ref 36.0–46.0)
HEMOGLOBIN: 7.2 g/dL — AB (ref 12.0–15.0)
MCH: 30.9 pg (ref 26.0–34.0)
MCHC: 32.1 g/dL (ref 30.0–36.0)
MCV: 96.1 fL (ref 80.0–100.0)
Platelets: 255 10*3/uL (ref 150–400)
RBC: 2.33 MIL/uL — ABNORMAL LOW (ref 3.87–5.11)
RDW: 15.5 % (ref 11.5–15.5)
WBC: 12.3 10*3/uL — ABNORMAL HIGH (ref 4.0–10.5)
nRBC: 0.9 % — ABNORMAL HIGH (ref 0.0–0.2)

## 2018-05-31 LAB — COMPREHENSIVE METABOLIC PANEL
ALT: 23 U/L (ref 0–44)
AST: 21 U/L (ref 15–41)
Albumin: 1.7 g/dL — ABNORMAL LOW (ref 3.5–5.0)
Alkaline Phosphatase: 95 U/L (ref 38–126)
Anion gap: 8 (ref 5–15)
BUN: 33 mg/dL — ABNORMAL HIGH (ref 8–23)
CO2: 25 mmol/L (ref 22–32)
Calcium: 7.6 mg/dL — ABNORMAL LOW (ref 8.9–10.3)
Chloride: 106 mmol/L (ref 98–111)
Creatinine, Ser: 1.95 mg/dL — ABNORMAL HIGH (ref 0.44–1.00)
GFR calc Af Amer: 31 mL/min — ABNORMAL LOW (ref >60)
GFR calc non Af Amer: 27 mL/min — ABNORMAL LOW (ref >60)
Glucose, Bld: 124 mg/dL — ABNORMAL HIGH (ref 70–99)
Potassium: 4 mmol/L (ref 3.5–5.1)
Sodium: 139 mmol/L (ref 135–145)
Total Bilirubin: 0.6 mg/dL (ref 0.3–1.2)
Total Protein: 5.6 g/dL — ABNORMAL LOW (ref 6.5–8.1)

## 2018-05-31 LAB — RENAL FUNCTION PANEL
Albumin: 1.6 g/dL — ABNORMAL LOW (ref 3.5–5.0)
Anion gap: 4 — ABNORMAL LOW (ref 5–15)
BUN: 33 mg/dL — ABNORMAL HIGH (ref 8–23)
CALCIUM: 7.1 mg/dL — AB (ref 8.9–10.3)
CO2: 22 mmol/L (ref 22–32)
Chloride: 111 mmol/L (ref 98–111)
Creatinine, Ser: 1.9 mg/dL — ABNORMAL HIGH (ref 0.44–1.00)
GFR calc Af Amer: 32 mL/min — ABNORMAL LOW (ref >60)
GFR calc non Af Amer: 28 mL/min — ABNORMAL LOW (ref >60)
Glucose, Bld: 128 mg/dL — ABNORMAL HIGH (ref 70–99)
Phosphorus: 3.2 mg/dL (ref 2.5–4.6)
Potassium: 4 mmol/L (ref 3.5–5.1)
SODIUM: 137 mmol/L (ref 135–145)

## 2018-05-31 LAB — GLUCOSE, CAPILLARY
Glucose-Capillary: 125 mg/dL — ABNORMAL HIGH (ref 70–99)
Glucose-Capillary: 135 mg/dL — ABNORMAL HIGH (ref 70–99)
Glucose-Capillary: 107 mg/dL — ABNORMAL HIGH (ref 70–99)
Glucose-Capillary: 129 mg/dL — ABNORMAL HIGH (ref 70–99)
Glucose-Capillary: 167 mg/dL — ABNORMAL HIGH (ref 70–99)

## 2018-05-31 LAB — POCT ACTIVATED CLOTTING TIME
ACTIVATED CLOTTING TIME: 202 s
Activated Clotting Time: 208 seconds
Activated Clotting Time: 208 seconds
Activated Clotting Time: 191 seconds
Activated Clotting Time: 186 seconds
Activated Clotting Time: 180 seconds

## 2018-05-31 LAB — MAGNESIUM: Magnesium: 2.1 mg/dL (ref 1.7–2.4)

## 2018-05-31 LAB — APTT
aPTT: >200 seconds (ref 24–36)
aPTT: 34 seconds (ref 24–36)

## 2018-05-31 LAB — RETICULOCYTES
Immature Retic Fract: 36.9 % — ABNORMAL HIGH (ref 2.3–15.9)
RBC.: 2.33 MIL/uL — ABNORMAL LOW (ref 3.87–5.11)
Retic Count, Absolute: 97.2 10*3/uL (ref 19.0–186.0)
Retic Ct Pct: 4.2 % — ABNORMAL HIGH (ref 0.4–3.1)

## 2018-05-31 LAB — FERRITIN: Ferritin: 832 ng/mL — ABNORMAL HIGH (ref 11–307)

## 2018-05-31 LAB — FOLATE: Folate: 36.7 ng/mL (ref >5.9)

## 2018-05-31 LAB — VITAMIN B12: Vitamin B-12: 1117 pg/mL — ABNORMAL HIGH (ref 180–914)

## 2018-05-31 MED ORDER — RISPERIDONE 1 MG/ML PO SOLN
0.5000 mg | Freq: Two times a day (BID) | ORAL | Status: DC
  Administered 2018-05-31 (×2): 0.5 mg
  Filled 2018-05-31 (×3): qty 0.5

## 2018-05-31 MED ORDER — OPIUM 10 MG/ML (1%) PO TINC
5.0000 mg | Freq: Four times a day (QID) | ORAL | Status: DC
  Administered 2018-05-31 – 2018-06-03 (×12): 5 mg
  Filled 2018-05-31 (×12): qty 1

## 2018-05-31 NOTE — Progress Notes (Signed)
Occupational Therapy Treatment Patient Details Name: Gwendolyn Pham MRN: [id number] DOB: 17-Jul-1955 Today's Date: 05/31/2018    History of present illness Pt is a 63 year old woman admitted May 19, 2018 with aortic occlusion, now s/p aortobifemoral BPG.  2/27 had hypotension and respiratory distress.  Intubated that night.  Extubated 05/18/18.  CRRT from 2/29/2022 to 05/18/2018. Pt with brief cardiac arrest during HD on 3/14. PMH: smoker, anxiety, depression, DM, asthma, HTN.   OT comments  Pt with eyes closed most of session, lethargic with restlessness at times. Inconsistently following commands. Pt required +2 total assist for bed mobility and demonstrated posterior lean with sitting EOB, zero sitting balance. Updated d/c plan to SNF as pt does not have 24 hour assistance. Per RN, pt to restart CRRT tomorrow. Will continue to follow. Will update goals next session.  Follow Up Recommendations  SNF;Supervision/Assistance - 24 hour    Equipment Recommendations  Other (comment)(defer to next venue)    Recommendations for Other Services      Precautions / Restrictions Precautions Precautions: Fall Precaution Comments: cortrack, abdominal vac, colostomy Restrictions Weight Bearing Restrictions: No       Mobility Bed Mobility Overal bed mobility: Needs Assistance Bed Mobility: Supine to Sit;Sit to Supine     Supine to sit: +2 for physical assistance;Total assist Sit to supine: +2 for physical assistance;Total assist   General bed mobility comments: assist for all aspects using bed pad to rotate pt's hips   Transfers                 General transfer comment: unsafe to attempt    Balance Overall balance assessment: Needs assistance Sitting-balance support: Bilateral upper extremity supported;Feet supported Sitting balance-Leahy Scale: Zero Sitting balance - Comments: posterior lean                                   ADL either performed or assessed with clinical  judgement   ADL Overall ADL's : Needs assistance/impaired     Grooming: Sitting;Total assistance;Wash/dry face;Oral care Grooming Details (indicate cue type and reason): pt can bring her R hand to face spontaneously, but not for functional use                                     Vision       Perception     Praxis      Cognition Arousal/Alertness: Lethargic(eyes open only briefly) Behavior During Therapy: Restless;Flat affect Overall Cognitive Status: Impaired/Different from baseline Area of Impairment: Following commands;Attention;Safety/judgement                   Current Attention Level: Focused   Following Commands: Follows one step commands inconsistently;Follows one step commands with increased time Safety/Judgement: Decreased awareness of safety;Decreased awareness of deficits     General Comments: pt not verbalizing during session, stating "huh?" when her name called        Exercises     Shoulder Instructions       General Comments      Pertinent Vitals/ Pain       Pain Assessment: Faces Faces Pain Scale: Hurts a little bit Pain Location: abdomen Pain Descriptors / Indicators: Discomfort Pain Intervention(s): Monitored during session;Repositioned  Home Living  Prior Functioning/Environment              Frequency  Min 2X/week        Progress Toward Goals  OT Goals(current goals can now be found in the care plan section)  Progress towards OT goals: Not progressing toward goals - comment(lethargic, AMS)  Acute Rehab OT Goals Patient Stated Goal: unable to state OT Goal Formulation: Patient unable to participate in goal setting Time For Goal Achievement: 06/05/18 Potential to Achieve Goals: Fair  Plan Discharge plan needs to be updated    Co-evaluation    PT/OT/SLP Co-Evaluation/Treatment: Yes Reason for Co-Treatment: Complexity of the patient's  impairments (multi-system involvement);For patient/therapist safety   OT goals addressed during session: ADL's and self-care;Strengthening/ROM      AM-PAC OT "6 Clicks" Daily Activity     Outcome Measure   Help from another person eating meals?: Total Help from another person taking care of personal grooming?: Total Help from another person toileting, which includes using toliet, bedpan, or urinal?: Total Help from another person bathing (including washing, rinsing, drying)?: Total Help from another person to put on and taking off regular upper body clothing?: Total Help from another person to put on and taking off regular lower body clothing?: Total 6 Click Score: 6    End of Session    OT Visit Diagnosis: Muscle weakness (generalized) (M62.81);Pain;Other symptoms and signs involving cognitive function   Activity Tolerance Patient limited by lethargy   Patient Left in bed;with call bell/phone within reach;with restraints reapplied;with SCD's reapplied(5 point restraints)   Nurse Communication          Time: 0086-7619 OT Time Calculation (min): 25 min  Charges: OT General Charges $OT Visit: 1 Visit OT Treatments $Therapeutic Activity: 8-22 mins  Gwendolyn Pham, OTR/L Acute Rehabilitation Services Pager: [phone] Office: [phone]   Gwendolyn Pham 05/31/2018, 3:32 PM

## 2018-05-31 NOTE — Progress Notes (Signed)
OT Cancellation Note  Patient Details Name: Gwendolyn Pham MRN: [id number] DOB: June 16, 1955   Cancelled Treatment:    Reason Eval/Treat Not Completed: Medical issues which prohibited therapy. RN keeping pt sedated and calm in preparation for impending MRI. Will follow.  Evern Bio 05/31/2018, 10:37 AM  Martie Round, OTR/L Acute Rehabilitation Services Pager: [phone] Office: [phone]

## 2018-05-31 NOTE — Progress Notes (Signed)
Subjective:  CRRT stopped yesterday- still req pressors - no UOP recorded   Objective Vital signs in last 24 hours: Vitals:   05/31/18 0615 05/31/18 0630 05/31/18 0645 05/31/18 0700  BP: 104/63 (!) 98/59 (!) 85/68 98/61  Pulse: 87 86 90 88  Resp: (!) 21 (!) 21 15 12   Temp:      TempSrc:      SpO2: 98% 99% 100% 100%  Weight:      Height:       Weight change: 0.6 kg  Intake/Output Summary (Last 24 hours) at 05/31/2018 0724 Last data filed at 05/31/2018 0606 Gross per 24 hour  Intake 2545.4 ml  Output 1983 ml  Net 562.4 ml    Assessment/ Plan: Pt is a 63 y.o. yo female who was admitted on 04/18/2018 with aortobifem bypass with complication of ischemic bowel with resection and also AKI req HD in some form since 2/29  Assessment/Plan: 1. Renal- AKI as complication of aortobifem in Feb- has been HD dep in some form over the last 19 days- did not tolerate IHD, back on CRRT from 3/15 to 3/17.   Given that her numbers were SO good yesterday, I  held CRRT again - no UOP, will likely need to resume something tomorrow, either IHD or CRRT  2. HTN/vol - possibly dry-now off CRRT so essentially getting fluids , is positive fluid balance but still req pressors 3. Anemia- hgb increasing as of 3/14- down now.  added ESA- may need more blood eventually   4. Elytes - K 4.0, phos 3.2- mag 2.3- no action needed 5. Neuro- uremia resolved but still encephalopathic - EEG unremark- to get MRI, neuro following    Gwendolyn Pham    Labs: Basic Metabolic Panel: Recent Labs  Lab 05/30/18 0452 05/30/18 1600 05/31/18 0355  NA 135 137 137  K 4.5 4.4 4.0  CL 104 105 111  CO2 26 24 22   GLUCOSE 165* 144* 128*  BUN 16 15 33*  CREATININE 0.85 0.96 1.90*  CALCIUM 8.0* 7.9* 7.1*  PHOS 2.0* 1.4* 3.2   Liver Function Tests: Recent Labs  Lab 05/30/18 0452 05/30/18 1600 05/31/18 0355  ALBUMIN 1.8* 1.8* 1.6*   No results for input(s): LIPASE, AMYLASE in the last 168 hours. Recent Labs  Lab  05/26/18 1042  AMMONIA 32   CBC: Recent Labs  Lab 05/25/18 0714 05/27/18 0927 05/30/18 0452 05/30/18 0900 05/31/18 0355  WBC 15.3* 14.8* 11.8* 12.9* 12.3*  HGB 9.3* 9.5* 7.5* 7.5* 7.2*  HCT 27.8* 29.1* 24.0* 24.4* 22.4*  MCV 93.3 93.3 95.2 96.4 96.1  PLT 269 394 256 245 255   Cardiac Enzymes: No results for input(s): CKTOTAL, CKMB, CKMBINDEX, TROPONINI in the last 168 hours. CBG: Recent Labs  Lab 05/30/18 1152 05/30/18 1538 05/30/18 1958 05/30/18 2349 05/31/18 0401  GLUCAP 102* 121* 145* 124* 125*    Iron Studies:  Recent Labs    05/31/18 0355  IRON 33  TIBC 199*  FERRITIN 832*   Studies/Results: Dg Chest Port 1 View  Result Date: 05/31/2018 CLINICAL DATA:  Respiratory failure EXAM: PORTABLE CHEST 1 VIEW COMPARISON:  May 28, 2018 FINDINGS: Feeding tube tip is in the distal stomach. Central catheter tip is in the superior vena cava. No pneumothorax. There is no edema or consolidation. Heart size and pulmonary vascularity are normal. No adenopathy. No bone lesions. There is aortic atherosclerosis. IMPRESSION: No edema or consolidation. Stable cardiac silhouette. Tube and catheter positions as described. No evident pneumothorax. Aortic Atherosclerosis (  ICD10-I70.0). Electronically Signed   By: Bretta Bang III M.D.   On: 05/31/2018 07:19   Medications: Infusions: .  prismasol BGK 4/2.5 500 mL/hr at 05/30/18 1016  .  prismasol BGK 4/2.5 500 mL/hr at 05/30/18 1019  . sodium chloride Stopped (05/30/18 1056)  . dexmedetomidine (PRECEDEX) IV infusion 1.2 mcg/kg/hr (05/31/18 0250)  . feeding supplement (VITAL AF 1.2 CAL) 1,000 mL (05/31/18 0303)  . heparin 10,000 units/ 20 mL infusion syringe 1,050 Units/hr (05/30/18 1712)  . norepinephrine (LEVOPHED) Adult infusion 2 mcg/min (05/31/18 0606)  . prismasol BGK 4/2.5 2,000 mL/hr at 05/30/18 1658  . thiamine injection Stopped (05/30/18 1030)    Scheduled Medications: . atorvastatin  80 mg Per Tube Daily  .  chlorhexidine  15 mL Mouth Rinse BID  . Chlorhexidine Gluconate Cloth  6 each Topical Daily  . darbepoetin (ARANESP) injection - NON-DIALYSIS  100 mcg Subcutaneous Q Tue-1800  . diphenoxylate-atropine  5 mL Per Tube TID  . feeding supplement (PRO-STAT SUGAR FREE 64)  30 mL Per Tube TID  . Gerhardt's butt cream   Topical BID  . insulin aspart  0-9 Units Subcutaneous Q4H  . loperamide HCl  4 mg Per Tube TID  . mouth rinse  15 mL Mouth Rinse q12n4p  . multivitamin  1 tablet Oral QHS  . octreotide  50 mcg Intravenous Q12H  . pantoprazole sodium  40 mg Per Tube Daily  . sodium chloride flush  10-40 mL Intracatheter Q12H    have reviewed scheduled and prn medications.  Physical Exam: General: alert, pleasantly confused- unable to get MRI as is too agitated  Heart: tachy  Lungs: mostly clear Abdomen: soft Extremities: no edema- no stigmata of CES on toes  Dialysis Access: right IJ vascath placed 3/15    05/31/2018,7:24 AM  LOS: 21 days

## 2018-05-31 NOTE — Progress Notes (Signed)
Physical Therapy Treatment Patient Details Name: Gwendolyn Pham MRN: [id number] DOB: Nov 28, 1955 Today's Date: 05/31/2018    History of Present Illness Pt is a 63 year old woman admitted 04/20/2018 with aortic occlusion, now s/p aortobifemoral BPG.  2/27 had hypotension and respiratory distress.  Intubated that night.  Extubated 05/18/18.  CRRT from 2/29/2022 to 05/18/2018. Pt with brief cardiac arrest during HD on 3/14. PMH: smoker, anxiety, depression, DM, asthma, HTN.    PT Comments    Pt seen in conjunction with OT, limit session due to patient lethargy with intermittent restlessness at times. Inconsistently following commands. Pt required +2 total assist for bed mobility and demonstrated posterior lean with sitting EOB, zero sitting balance. Updated d/c plan to SNF as pt does not have 24 hour assistance. Per RN, pt to restart CRRT tomorrow. Will continue to follow. Will update goals next session.   Follow Up Recommendations  CIR;Supervision/Assistance - 24 hour     Equipment Recommendations  Other (comment)(TBD at next venue)    Recommendations for Other Services Rehab consult     Precautions / Restrictions Precautions Precautions: Fall Precaution Comments: cortrack, abdominal vac, colostomy Restrictions Weight Bearing Restrictions: No    Mobility  Bed Mobility Overal bed mobility: Needs Assistance Bed Mobility: Supine to Sit;Sit to Supine     Supine to sit: +2 for physical assistance;Total assist Sit to supine: +2 for physical assistance;Total assist   General bed mobility comments: assist for all aspects using bed pad to rotate pt's hips   Transfers                 General transfer comment: unsafe to attempt  Ambulation/Gait                 Stairs             Wheelchair Mobility    Modified Rankin (Stroke Patients Only)       Balance Overall balance assessment: Needs assistance Sitting-balance support: Bilateral upper extremity  supported;Feet supported Sitting balance-Leahy Scale: Zero Sitting balance - Comments: posterior lean                                    Cognition Arousal/Alertness: Lethargic(eyes open only briefly) Behavior During Therapy: Restless;Flat affect Overall Cognitive Status: Impaired/Different from baseline Area of Impairment: Following commands;Attention;Safety/judgement                   Current Attention Level: Focused   Following Commands: Follows one step commands inconsistently;Follows one step commands with increased time Safety/Judgement: Decreased awareness of safety;Decreased awareness of deficits     General Comments: pt not verbalizing during session, stating "huh?" when her name called      Exercises      General Comments        Pertinent Vitals/Pain Pain Assessment: Faces Faces Pain Scale: Hurts a little bit Pain Location: abdomen Pain Descriptors / Indicators: Discomfort Pain Intervention(s): Monitored during session;Repositioned    Home Living                      Prior Function            PT Goals (current goals can now be found in the care plan section) Acute Rehab PT Goals Patient Stated Goal: unable to state PT Goal Formulation: Patient unable to participate in goal setting Time For Goal Achievement: 06/02/18 Potential to Achieve Goals: Fair  Progress towards PT goals: Not progressing toward goals - comment    Frequency    Min 2X/week      PT Plan Current plan remains appropriate    Co-evaluation PT/OT/SLP Co-Evaluation/Treatment: Yes Reason for Co-Treatment: Complexity of the patient's impairments (multi-system involvement);For patient/therapist safety PT goals addressed during session: Mobility/safety with mobility OT goals addressed during session: ADL's and self-care;Strengthening/ROM      AM-PAC PT "6 Clicks" Mobility   Outcome Measure  Help needed turning from your back to your side while in a  flat bed without using bedrails?: Total Help needed moving from lying on your back to sitting on the side of a flat bed without using bedrails?: Total Help needed moving to and from a bed to a chair (including a wheelchair)?: Total Help needed standing up from a chair using your arms (e.g., wheelchair or bedside chair)?: Total Help needed to walk in hospital room?: Total Help needed climbing 3-5 steps with a railing? : Total 6 Click Score: 6    End of Session   Activity Tolerance: Patient limited by fatigue;Patient limited by lethargy Patient left: in bed;with call bell/phone within reach;with bed alarm set;with restraints reapplied Nurse Communication: Mobility status PT Visit Diagnosis: Unsteadiness on feet (R26.81);Difficulty in walking, not elsewhere classified (R26.2) Pain - part of body: (abdomen)     Time: 9935-7017 PT Time Calculation (min) (ACUTE ONLY): 24 min  Charges:  $Therapeutic Activity: 8-22 mins                     Charlotte Crumb, PT DPT  Board Certified Neurologic Specialist Acute Rehabilitation Services Pager [phone] Office [phone]    Fabio Asa 05/31/2018, 4:38 PM

## 2018-05-31 NOTE — Progress Notes (Signed)
Patient ID: Gwendolyn Pham, female   DOB: December 20, 1955, 63 y.o.   MRN: [id number]  Progress Note    05/31/2018 6:16 PM 18 Days Post-Op  Subjective: Confused.  No change.   Vitals:   05/31/18 1500 05/31/18 1600  BP: 117/61   Pulse: (!) 110 (!) 57  Resp: (!) 25 16  Temp: 97.9 F (36.6 C)   SpO2: 100% 97%   Physical Exam: Abdomen soft.  Midline wound VAC intact.  Groin incisions completely healed and 2-3+ femoral pulses.  Palpable pedal pulses bilaterally  CBC    Component Value Date/Time   WBC 12.3 (H) 05/31/2018 0355   RBC 2.33 (L) 05/31/2018 0355   RBC 2.33 (L) 05/31/2018 0355   HGB 7.2 (L) 05/31/2018 0355   HGB 15.8 01/23/2018 1218   HCT 22.4 (L) 05/31/2018 0355   HCT 45.9 01/23/2018 1218   PLT 255 05/31/2018 0355   PLT 253 01/23/2018 1218   MCV 96.1 05/31/2018 0355   MCV 93 01/23/2018 1218   MCH 30.9 05/31/2018 0355   MCHC 32.1 05/31/2018 0355   RDW 15.5 05/31/2018 0355   RDW 13.7 01/23/2018 1218   LYMPHSABS 1.1 2018/05/25 1802   LYMPHSABS 2.8 01/23/2018 1218   MONOABS 0.3 May 25, 2018 1802   EOSABS 0.0 2018-05-25 1802   EOSABS 0.1 01/23/2018 1218   BASOSABS 0.0 05/25/2018 1802   BASOSABS 0.1 01/23/2018 1218    BMET    Component Value Date/Time   NA 137 05/31/2018 0355   NA 139 05/31/2018 0355   NA 137 01/23/2018 1218   K 4.0 05/31/2018 0355   K 4.0 05/31/2018 0355   CL 111 05/31/2018 0355   CL 106 05/31/2018 0355   CO2 22 05/31/2018 0355   CO2 25 05/31/2018 0355   GLUCOSE 128 (H) 05/31/2018 0355   GLUCOSE 124 (H) 05/31/2018 0355   BUN 33 (H) 05/31/2018 0355   BUN 33 (H) 05/31/2018 0355   BUN 11 01/23/2018 1218   CREATININE 1.90 (H) 05/31/2018 0355   CREATININE 1.95 (H) 05/31/2018 0355   CREATININE 0.66 06/18/2015 1855   CALCIUM 7.1 (L) 05/31/2018 0355   CALCIUM 7.6 (L) 05/31/2018 0355   GFRNONAA 28 (L) 05/31/2018 0355   GFRNONAA 27 (L) 05/31/2018 0355   GFRNONAA >89 01/31/2014 1532   GFRAA 32 (L) 05/31/2018 0355   GFRAA 31 (L) 05/31/2018 0355    GFRAA >89 01/31/2014 1532    INR    Component Value Date/Time   INR 1.4 (H) 05/12/2018 0415     Intake/Output Summary (Last 24 hours) at 05/31/2018 1816 Last data filed at 05/31/2018 1600 Gross per 24 hour  Intake 1469.14 ml  Output 550 ml  Net 919.14 ml     Assessment/Plan:  63 y.o. female with no clinical change.  Appreciate consultants support     Larina Earthly, MD FACS [facility] [phone] 05/31/2018 6:16 PM

## 2018-05-31 NOTE — Consult Note (Signed)
WOC Nurse wound follow up Wound type:Surgical Measurement: 20cm x 2cm x 2.5cm Wound bed:Red, moist Drainage (amount, consistency, odor) small serous Periwound: Intact.  Dressing procedure/placement/frequency  One piece of black foam removed, wound cleansed and periwound prepped with skin barrier rings and pieces of drape.Opened center of wound with gentle pressure on a cotton tipped applicator as I suspected a false bottom to wound bed. Drop in depth measures 2.5cm in center.  Unable to open any other areas of depth. Serous fluid expressed from wound center. One piece of black foam placed into wound bed. Drape applied and dressing attached to negative, continuous pressure.  An immediate seal is achieved. Next scheduled VAC change is Monday, 06/02/2018.   WOC Nurse ostomy follow up Stoma type/location: LUQ colostomy Stomal assessment/size: 1 and 1/8 inch x 1 and 3/8 inches oval with os at 6 o'clock and at skin level.Ostomy is pink, moist Peristomal assessment: intact Treatment options for stomal/peristomal skin: skin barrier ring Output: thin, yellow, frothy effluent Ostomy pouching: 1pc.convex pouch with skin barrier ring Education provided: None. Patient is confused. Enrolled patient in [facility] Secure Start Discharge program: No  I am assisted in this wound and ostomy pouch change by Barnett Hatter, RN, WTA-C, OCA  as patient flails her arms and legs around during procedure.  I appreciate her expertise and assistance.  Extra pouches (3) and rings (2) are at bedside. Five extra rings are ordered.   WOC nursing team will follow, and will remain available to this patient, the nursing and medical teams.   Thanks, Ladona Mow, MSN, RN, GNP, [facility]  Pager# [phone]

## 2018-05-31 NOTE — Progress Notes (Signed)
eLink Physician-Brief Progress Note Patient Name: Gwendolyn Pham DOB: [birth year] MRN: [id number]   Date of Service  05/31/2018  HPI/Events of Note  Patient had Versed 2 mg IV for MRI and continues to be too agitated to get MRI scan.   eICU Interventions  Will order: 1. Increase Precedex IV infusion to a ceiling of 1.5 mcg/kg/hour.     Intervention Category Major Interventions: Delirium, psychosis, severe agitation - evaluation and management  , [place] 05/31/2018, 2:30 AM

## 2018-05-31 NOTE — Progress Notes (Signed)
NAME:  Gwendolyn Pham, MRN:  [id number], DOB:  04-06-1955, LOS: 21 ADMISSION DATE:  05/09/2018, CONSULTATION DATE:  05/27/2018 REFERRING MD:  Hyman Hopes - Nephrology, CHIEF COMPLAINT:  Acute renal failure.   HPI/course in hospital  63 year old woman with complicated course following aorto-bifemoral bypass performed 2/26 for severe claudication with aortic occlusion.  Complicated course thereafter by ischemic bowel with resection of distal small bowel and partial colectomy with eventual closure of the abdomen after a multistep surgery on 2/29. She has developed AKI and required HD. She has poor would healing. She had hypotension and did not tolerate IHD & developed confusion due to uremia.   3/14 she sustained a brief cardiac arrest on initiation of HD.   RIJ HD cath 3/15 >> CVVHD initiated  Significant tests/ events reviewed 3/17 CRRT stopped   Interim history/subjective:   Remains agitated on high-dose Precedex. Required 6 mg of Ativan last 24 hours, unable to sedate adequately even with Versed to get MRI CRRT was stopped and she remains anuric   Objective   Blood pressure 98/62, pulse 89, temperature 98.2 F (36.8 C), temperature source Oral, resp. rate 19, height 5\' 4"  (1.626 m), weight 57 kg, SpO2 100 %.        Intake/Output Summary (Last 24 hours) at 05/31/2018 0849 Last data filed at 05/31/2018 0800 Gross per 24 hour  Intake 2509.19 ml  Output 1871 ml  Net 638.19 ml   Filed Weights   05/29/18 0500 05/30/18 0457 05/31/18 0419  Weight: 57 kg 56.4 kg 57 kg    Examination: Chronically ill-appearing, older than stated age, mild agitation moves head around Pallor, no icterus No JVD, dialysis catheter no bleeding Decreased breath sounds bilateral S1-S2 regular Soft nontender abdomen, stool in ostomy bag, no drainage from wound VAC Follows one-step commands, intermittent agitation, moves all 4 extremities 1+ edema   Assessment & Plan:   Acute encephalopathy:  Head CT  3/13 old right frontal/MCA infarct ,?  Small left thalamus infarct versus artifact EEG 3/13?  Triphasic waves more prominent on the left versus artifact Neuro want MRI brain when off CRRT  - Continue Precedex, goal RASS 0, minimize as able - Fentanyl for pain - Needs MRI when able to cooperate or gets intubated, and off CRRT -We will add Risperdal 1.5 twice daily and titrate to effect, would like to eventually discontinue Ativan because this seems to be perpetuating delirium               AKI : HD dep in some form since 2/29/ /2020 - did not tolerate IHD, needed CRRT-  3/15 -3/17 Remains anuric and creatinine has more than doubled Plan -We will need to resume CRRT unless she comes off Levophed and next 24 hours   Ischemic  Bowel -status post right and left colon resection, small bowel resection 2/27 with left end colostomy 2/29 and ileocolonic anastomosis Stoma looks pink Plan: -Postop management of ostomy and wound VAC per wound care>> Surgery has signed off,  - Tolerating TF - Remains on  lomotil, octreotide, imodium for high output  -Consider DC octreotide again at some point  Diabetes type 2-SSI - CBG Q 4 - SSI -Continue tube feeds.  Leukocytosis Slowly improving off antibiotics Plan Trend CBC    Anemia of critical illness Drop in HGB from 9.5-7.2  Plan Start ESA Transfuse for HGB less than 7 Monitor for any signs of obvious bleeding Hold Heparin>> SCD's ordered   Updated sister on a daily  basis, will need goals of care discussion here.  She would like short-term ventilation but would not want prolonged life support  The patient is critically ill with multiple organ systems failure and requires high complexity decision making for assessment and support, frequent evaluation and titration of therapies, application of advanced monitoring technologies and extensive interpretation of multiple databases. Critical Care Time devoted to patient care services described in  this note independent of APP/resident  time is 31 minutes.   Cyril Mourning MD. Tonny Bollman. [facility] Pager [phone] If no response call [phone]   05/31/2018     05/31/2018  8:49 AM

## 2018-05-31 NOTE — Progress Notes (Signed)
PT Cancellation Note  Patient Details Name: Gwendolyn Pham MRN: [id number] DOB: 05-14-1955   Cancelled Treatment:    Reason Eval/Treat Not Completed: Patient not medically ready, per nursing, awaiting MRI attempting to keep patient sedated at this time. Will hold.   Fabio Asa 05/31/2018, 10:35 AM Charlotte Crumb, PT DPT  Board Certified Neurologic Specialist Acute Rehabilitation Services Pager [phone] Office [phone]

## 2018-06-01 ENCOUNTER — Inpatient Hospital Stay (HOSPITAL_COMMUNITY): Payer: Medicare Other

## 2018-06-01 ENCOUNTER — Ambulatory Visit: Payer: Medicare Other | Admitting: Family Medicine

## 2018-06-01 ENCOUNTER — Ambulatory Visit: Payer: Self-pay

## 2018-06-01 LAB — POCT ACTIVATED CLOTTING TIME
ACTIVATED CLOTTING TIME: 180 s
Activated Clotting Time: 147 seconds
Activated Clotting Time: 142 seconds
Activated Clotting Time: 158 seconds
Activated Clotting Time: 164 seconds
Activated Clotting Time: 180 seconds
Activated Clotting Time: 186 seconds
Activated Clotting Time: 191 seconds
Activated Clotting Time: 197 seconds

## 2018-06-01 LAB — RENAL FUNCTION PANEL
Albumin: 1.8 g/dL — ABNORMAL LOW (ref 3.5–5.0)
Albumin: 2.1 g/dL — ABNORMAL LOW (ref 3.5–5.0)
Anion gap: 9 (ref 5–15)
Anion gap: 11 (ref 5–15)
BUN: 68 mg/dL — ABNORMAL HIGH (ref 8–23)
BUN: 37 mg/dL — ABNORMAL HIGH (ref 8–23)
CHLORIDE: 103 mmol/L (ref 98–111)
CO2: 24 mmol/L (ref 22–32)
CO2: 25 mmol/L (ref 22–32)
CREATININE: 2.02 mg/dL — AB (ref 0.44–1.00)
Calcium: 8.6 mg/dL — ABNORMAL LOW (ref 8.9–10.3)
Calcium: 8.4 mg/dL — ABNORMAL LOW (ref 8.9–10.3)
Chloride: 104 mmol/L (ref 98–111)
Creatinine, Ser: 3.97 mg/dL — ABNORMAL HIGH (ref 0.44–1.00)
GFR calc Af Amer: 13 mL/min — ABNORMAL LOW (ref >60)
GFR calc Af Amer: 30 mL/min — ABNORMAL LOW (ref >60)
GFR calc non Af Amer: 26 mL/min — ABNORMAL LOW (ref >60)
GFR, EST NON AFRICAN AMERICAN: 11 mL/min — AB (ref >60)
Glucose, Bld: 145 mg/dL — ABNORMAL HIGH (ref 70–99)
Glucose, Bld: 137 mg/dL — ABNORMAL HIGH (ref 70–99)
Phosphorus: 7.1 mg/dL — ABNORMAL HIGH (ref 2.5–4.6)
Phosphorus: 3.3 mg/dL (ref 2.5–4.6)
Potassium: 4.5 mmol/L (ref 3.5–5.1)
Potassium: 4.1 mmol/L (ref 3.5–5.1)
Sodium: 137 mmol/L (ref 135–145)
Sodium: 139 mmol/L (ref 135–145)

## 2018-06-01 LAB — CBC
HCT: 22.5 % — ABNORMAL LOW (ref 36.0–46.0)
Hemoglobin: 6.8 g/dL — CL (ref 12.0–15.0)
MCH: 29.7 pg (ref 26.0–34.0)
MCHC: 30.2 g/dL (ref 30.0–36.0)
MCV: 98.3 fL (ref 80.0–100.0)
Platelets: 251 10*3/uL (ref 150–400)
RBC: 2.29 MIL/uL — AB (ref 3.87–5.11)
RDW: 15.8 % — ABNORMAL HIGH (ref 11.5–15.5)
WBC: 14.3 10*3/uL — ABNORMAL HIGH (ref 4.0–10.5)
nRBC: 0.3 % — ABNORMAL HIGH (ref 0.0–0.2)

## 2018-06-01 LAB — APTT: aPTT: 38 seconds — ABNORMAL HIGH (ref 24–36)

## 2018-06-01 LAB — GLUCOSE, CAPILLARY
GLUCOSE-CAPILLARY: 142 mg/dL — AB (ref 70–99)
Glucose-Capillary: 124 mg/dL — ABNORMAL HIGH (ref 70–99)
Glucose-Capillary: 107 mg/dL — ABNORMAL HIGH (ref 70–99)
Glucose-Capillary: 120 mg/dL — ABNORMAL HIGH (ref 70–99)
Glucose-Capillary: 127 mg/dL — ABNORMAL HIGH (ref 70–99)
Glucose-Capillary: 118 mg/dL — ABNORMAL HIGH (ref 70–99)
Glucose-Capillary: 138 mg/dL — ABNORMAL HIGH (ref 70–99)

## 2018-06-01 LAB — MAGNESIUM: Magnesium: 2.2 mg/dL (ref 1.7–2.4)

## 2018-06-01 LAB — PREPARE RBC (CROSSMATCH)

## 2018-06-01 LAB — PHOSPHORUS: PHOSPHORUS: 6.9 mg/dL — AB (ref 2.5–4.6)

## 2018-06-01 MED ORDER — LORAZEPAM 2 MG/ML IJ SOLN
1.0000 mg | Freq: Three times a day (TID) | INTRAMUSCULAR | Status: DC | PRN
  Administered 2018-06-01: 2 mg via INTRAVENOUS
  Filled 2018-06-01: qty 1

## 2018-06-01 MED ORDER — HEPARIN SODIUM (PORCINE) 1000 UNIT/ML DIALYSIS
1000.0000 [IU] | INTRAMUSCULAR | Status: DC | PRN
  Filled 2018-06-01: qty 6

## 2018-06-01 MED ORDER — PRISMASOL BGK 4/2.5 32-4-2.5 MEQ/L REPLACEMENT SOLN
Status: DC
  Administered 2018-06-01 – 2018-06-02 (×3): via INTRAVENOUS_CENTRAL
  Filled 2018-06-01 (×4): qty 5000

## 2018-06-01 MED ORDER — RISPERIDONE 1 MG/ML PO SOLN
1.0000 mg | Freq: Two times a day (BID) | ORAL | Status: DC
  Administered 2018-06-01 – 2018-06-03 (×6): 1 mg
  Filled 2018-06-01 (×6): qty 1

## 2018-06-01 MED ORDER — SODIUM CHLORIDE 0.9 % IV SOLN
250.0000 [IU]/h | INTRAVENOUS | Status: DC
  Administered 2018-06-01: 1000 [IU]/h via INTRAVENOUS_CENTRAL
  Administered 2018-06-01: 200 [IU]/h via INTRAVENOUS_CENTRAL
  Administered 2018-06-01: 900 [IU]/h via INTRAVENOUS_CENTRAL
  Administered 2018-06-01: 1050 [IU]/h via INTRAVENOUS_CENTRAL
  Administered 2018-06-01: 950 [IU]/h via INTRAVENOUS_CENTRAL
  Administered 2018-06-01: 1050 [IU]/h via INTRAVENOUS_CENTRAL
  Administered 2018-06-01: 800 [IU]/h via INTRAVENOUS_CENTRAL
  Administered 2018-06-02: 550 [IU]/h via INTRAVENOUS_CENTRAL
  Administered 2018-06-02: 1050 [IU]/h via INTRAVENOUS_CENTRAL
  Filled 2018-06-01 (×4): qty 2

## 2018-06-01 MED ORDER — PRISMASOL BGK 4/2.5 32-4-2.5 MEQ/L IV SOLN
INTRAVENOUS | Status: DC
  Administered 2018-06-01 – 2018-06-03 (×14): via INTRAVENOUS_CENTRAL
  Filled 2018-06-01 (×18): qty 5000

## 2018-06-01 MED ORDER — SODIUM CHLORIDE 0.9 % FOR CRRT
INTRAVENOUS_CENTRAL | Status: DC | PRN
  Filled 2018-06-01: qty 1000

## 2018-06-01 MED ORDER — SODIUM CHLORIDE 0.9% IV SOLUTION: Freq: Once | INTRAVENOUS | Status: DC

## 2018-06-01 MED ORDER — PRISMASOL BGK 4/2.5 32-4-2.5 MEQ/L REPLACEMENT SOLN
Status: DC
  Administered 2018-06-01 – 2018-06-03 (×4): via INTRAVENOUS_CENTRAL
  Filled 2018-06-01 (×5): qty 5000

## 2018-06-01 MED ORDER — HEPARIN BOLUS VIA INFUSION (CRRT)
1000.0000 [IU] | INTRAVENOUS | Status: DC | PRN
  Administered 2018-06-01 (×2): 1000 [IU] via INTRAVENOUS_CENTRAL
  Filled 2018-06-01: qty 1000

## 2018-06-01 NOTE — Progress Notes (Signed)
NAME:  Gwendolyn Pham, MRN:  [id number], DOB:  05-06-1955, LOS: 22 ADMISSION DATE:  05/05/2018, CONSULTATION DATE:  05/27/2018 REFERRING MD:  Hyman Hopes - Nephrology, CHIEF COMPLAINT:  Acute renal failure.   HPI/course in hospital  63 year old woman with complicated course following aorto-bifemoral bypass performed 2/26 for severe claudication with aortic occlusion.  Complicated course thereafter by ischemic bowel with resection of distal small bowel and partial colectomy with eventual closure of the abdomen after a multistep surgery on 2/29. She has developed AKI and required HD. She has poor would healing. She had hypotension and did not tolerate IHD & developed confusion due to uremia.   3/14 she sustained a brief cardiac arrest on initiation of HD.   RIJ HD cath 3/15 >> CVVHD initiated  Significant tests/ events reviewed 3/17 CRRT stopped 3/18 creat doubled 3/18 MRI brain-chronic right MCA infarct   Interim history/subjective:   Remains afebrile, critically ill, delirious on four-point restraints and Posey On low-dose Levophed Anuric   Objective   Blood pressure 96/75, pulse 93, temperature 97.8 F (36.6 C), temperature source Axillary, resp. rate 14, height 5\' 4"  (1.626 m), weight 56.4 kg, SpO2 99 %.        Intake/Output Summary (Last 24 hours) at 06/01/2018 0842 Last data filed at 06/01/2018 0600 Gross per 24 hour  Intake 1219.64 ml  Output 325 ml  Net 894.64 ml   Filed Weights   05/30/18 0457 05/31/18 0419 06/01/18 0443  Weight: 56.4 kg 57 kg 56.4 kg    Examination: Chronically ill-appearing, older than stated age, intermittent agitation, constantly moving Pallor, no icterus No JVD, dialysis catheter no bleeding Decreased breath sounds bilateral S1-S2 regular Soft nontender abdomen, stool in ostomy bag, minimal drainage from wound VAC Follows one-step commands, could tell me her name,  moves all 4 extremities 1+ edema   Assessment & Plan:   Acute  encephalopathy: Due to ICU delirium Head CT 3/13 old right frontal/MCA infarct ,?  Small left thalamus infarct versus artifact EEG 3/13?  Triphasic waves more prominent on the left versus artifact MRI-did not show any new infarcts  - Continue Precedex, goal RASS 0, minimize as able - Fentanyl for pain -Increase Risperdal 1mg  twice daily and titrate to effect, would like to eventually discontinue Ativan because this seems to be perpetuating delirium               AKI : HD dep in some form since 2/29/ /2020 - did not tolerate IHD, needed CRRT-  3/15 -3/17 Remains anuric and creatinine has  doubled every day Plan - resume CRRT -unfortunately she has not tolerated intermittent dialysis due to hypotension Not clear that she is a candidate for long-term dialysis   Ischemic  Bowel -status post right and left colon resection, small bowel resection 2/27 with left end colostomy 2/29 and ileocolonic anastomosis Stoma looks pink Plan: -Postop management of ostomy and wound VAC per wound care>> Surgery has signed off,  - Tolerating TF - Remains on  octreotide, imodium and tincture of opium for high output  -Consider DC octreotide again   Diabetes type 2-SSI - CBG Q 4 - SSI -Continue tube feeds.  Leukocytosis Stable off antibiotics Plan Trend CBC  Anemia of critical illness Drop in HGB from 9.5- 6.8  Plan Start ESA Transfuse for HGB less than 7-1 unit PRBC today No signs of obvious bleeding Hold Heparin>> SCD's ordered   Updated sister on a daily basis, based on discussion 3/18 issue DNR order.  Not sure that she is a candidate for long-term dialysis.  Will involve palliative care for further discussion if vascular surgery is in agreement  The patient is critically ill with multiple organ systems failure and requires high complexity decision making for assessment and support, frequent evaluation and titration of therapies, application of advanced monitoring technologies and  extensive interpretation of multiple databases. Critical Care Time devoted to patient care services described in this note independent of APP/resident  time is 32 minutes.    Cyril Mourning MD. Tonny Bollman. [facility] Pager [phone] If no response call [phone]    06/01/2018  8:42 AM

## 2018-06-01 NOTE — Discharge Instructions (Signed)
Colostomy Home Guide, Adult       Colostomy surgery is done to create an opening in the front of the abdomen for stool (feces) to leave the body through an ostomy (stoma). Part of the large intestine is attached to the stoma. A bag, also called a pouch, is fitted over the stoma. Stool and gas will collect in the bag.  After surgery, you will need to empty and change your colostomy bag as needed. You will also need to care for your stoma.  How to care for the stoma  Your stoma should look pink, red, and moist, like the inside of your cheek. Soon after surgery, the stoma may be swollen, but this swelling will go away within 6 weeks. To care for the stoma:  · Keep the skin around the stoma clean and dry.  · Use a clean, soft washcloth to gently wash the stoma and the skin around it. Clean using a circular motion, and wipe away from the stoma opening, not toward it.  ? Use warm water and only use cleansers recommended by your health care provider.  ? Rinse the stoma area with plain water.  ? Dry the area around the stoma well.  · Use stoma powder or ointment on your skin only as told by your health care provider. Do not use any other powders, gels, wipes, or creams on the skin around the stoma.  · Check the stoma area every day for signs of infection. Check for:  ? New or worsening redness, swelling, or pain.  ? New or increased fluid or blood.  ? Pus or warmth.  · Measure the stoma opening regularly and record the size. Watch for changes. (It is normal for the stoma to get smaller as swelling goes away.) Share this information with your health care provider.  How to empty the colostomy bag       Empty your bag at bedtime and whenever it is one-third to one-half full. Do not let the bag get more than half-full with stool or gas. The bag could leak if it gets too full. Some colostomy bags have a built-in gas release valve that releases gas often throughout the day.  Follow these basic steps:  1. Wash your hands with soap  and water.  2. Sit far back on the toilet seat.  3. Put several pieces of toilet paper into the toilet water. This will prevent splashing as you empty stool into the toilet.  4. Remove the clip or the hook-and-loop fastener from the tail end of the bag.  5. Unroll the tail, then empty the stool into the toilet.  6. Clean the tail with toilet paper or a moist towelette.  7. Reroll the tail, and close it with the clip or the hook-and-loop fastener.  8. Wash your hands again.  How to change the colostomy bag  Change your bag every 3-4 days or as often as told by your health care provider. Also change the bag if it is leaking or separating from the skin, or if your skin around the stoma looks or feels irritated. Irritated skin may be a sign that the bag is leaking.  Always have colostomy supplies with you, and follow these basic steps:  1. Wash your hands with soap and water. Have paper towels or tissues nearby to clean any discharge.  2. Remove the old bag and skin barrier. Use your fingers or a warm cloth to gently push the skin away from the barrier.  3.   Clean the stoma area with water or with mild soap and water, as directed. Use water to rinse away any soap.  4. Dry the skin. You may use the cool setting on a hair dryer to do this.  5. Use a tracing pattern (template) to cut the skin barrier to the size needed.  6. If you are using a two-piece bag, attach the bag and the skin barrier to each other. Add the barrier ring, if you use one.  7. If directed, apply stoma powder or skin barrier gel to the skin.  8. Warm the skin barrier with your hands, or blow with a hair dryer for 5-10 seconds.  9. Remove the paper from the adhesive strip of the skin barrier.  10. Press the adhesive strip onto the skin around the stoma.  11. Gently rub the skin barrier onto the skin. This creates heat that helps the barrier to stick.  12. Apply stoma tape to the edges of the skin barrier, if desired.  13. Wash your hands again.  General  recommendations  · Avoid wearing tight clothes or having anything press directly on your stoma or bag. Change your clothing whenever it is soiled or damp.  · You may shower or bathe with the bag on or off. Do not use harsh or oily soaps or lotions. Dry the skin and bag after bathing.  · Store all supplies in a cool, dry place. Do not leave supplies in extreme heat because some parts can melt or not stick as well.  · Whenever you leave home, take extra clothing and an extra skin barrier and bag with you.  · If your bag gets wet, you can dry it with a hair dryer on the cool setting.  · To prevent odor, you may put drops of ostomy deodorizer in the bag.  · If recommended by your health care provider, put ostomy lubricant inside the bag. This helps stool to slide out of the bag more easily and completely.  Contact a health care provider if:  · You have new or worsening redness, swelling, or pain around your stoma.  · You have new or increased fluid or blood coming from your stoma.  · Your stoma feels warm to the touch.  · You have pus coming from your stoma.  · Your stoma extends in or out farther than normal.  · You need to change your bag every day.  · You have a fever.  Get help right away if:  · Your stool is bloody.  · You have nausea or you vomit.  · You have trouble breathing.  Summary  · Measure your stoma opening regularly and record the size. Watch for changes.  · Empty your bag at bedtime and whenever it is one-third to one-half full. Do not let the bag get more than half-full with stool or gas.  · Change your bag every 3-4 days or as often as told by your health care provider.  · Whenever you leave home, take extra clothing and an extra skin barrier and bag with you.  This information is not intended to replace advice given to you by your health care provider. Make sure you discuss any questions you have with your health care provider.  Document Released: 03/04/2003 Document Revised: 11/04/2017 Document  Reviewed: 08/25/2016  Elsevier Interactive Patient Education © 2019 [employer]   

## 2018-06-01 NOTE — Progress Notes (Signed)
  Echocardiogram 2D Echocardiogram has been performed.  Delcie Roch 06/01/2018, 2:24 PM

## 2018-06-01 NOTE — Progress Notes (Signed)
eLink Physician-Brief Progress Note Patient Name: KATOSHA BANAGA DOB: Feb 19, 1956 MRN: [id number]   Date of Service  06/01/2018  HPI/Events of Note  Notified of low hgb at 6.8  eICU Interventions  Transfuse 1 unit pRBC.     Intervention Category Minor Interventions: Other:  Larinda Buttery 06/01/2018, 6:55 AM

## 2018-06-01 NOTE — Progress Notes (Signed)
CRITICAL VALUE ALERT  Critical Value: Hgb 6.8  Date & Time Notied:  0632  Provider Notified: Elink  Orders Received/Actions taken: Will await orders and continue to monitor. Patient remains asymptomatic and on Levo.

## 2018-06-01 NOTE — Progress Notes (Signed)
Assess patient sacrum no skin breakdown noted.

## 2018-06-01 NOTE — Progress Notes (Signed)
Patient ID: Gwendolyn Pham, female   DOB: 30-Apr-1955, 63 y.o.   MRN: [id number] No change in clinical status. Still confused. Agree with Dr.Avva regarding palliative care consult for goals of care.

## 2018-06-01 NOTE — Progress Notes (Addendum)
19 Days Post-Op  Subjective: CC:  Off CRRT, had MRI head o/n. WBC up slightly today. Remains delirious. .   Objective: Vital signs in last 24 hours: Temp:  [97.8 F (36.6 C)-99.2 F (37.3 C)] 97.8 F (36.6 C) (03/19 0730) Pulse Rate:  [47-110] 88 (03/19 0700) Resp:  [11-29] 11 (03/19 0700) BP: (78-121)/(52-96) 106/56 (03/19 0700) SpO2:  [93 %-100 %] 100 % (03/19 0700) Weight:  [56.4 kg] 56.4 kg (03/19 0443) Last BM Date: 05/31/18  Intake/Output from previous day: 03/18 0701 - 03/19 0700 In: 1245.4 [I.V.:609.1; NG/GT:600; IV Piggyback:36.3] Out: 325 [Drains:100; Stool:225] Intake/Output this shift: No intake/output data recorded.  PE: Gen: frail, ill appearing, NAD, responds to her name but garbled speech.  Card:Regular mildly tachy Lungs: Normal effort Abd: Soft, non-distended, +BS, stomabudded and viable with os at 6 o'clock.There is gas and light brown/yellow liquid stool in colostomy bag. 225cc output recorded overnight.Midline wound vac in place.No surrounding signs of cellulitis. Abdomen is soft without distention or tenderness.No peritonitis. Skin: warm and dry  Lab Results:  Recent Labs    05/31/18 0355 06/01/18 0448  WBC 12.3* 14.3*  HGB 7.2* 6.8*  HCT 22.4* 22.5*  PLT 255 251   BMET Recent Labs    05/31/18 0355 06/01/18 0448  NA 139   137 137  K 4.0   4.0 4.5  CL 106   111 104  CO2 25   22 24   GLUCOSE 124*   128* 145*  BUN 33*   33* 68*  CREATININE 1.95*   1.90* 3.97*  CALCIUM 7.6*   7.1* 8.6*   PT/INR No results for input(s): LABPROT, INR in the last 72 hours. CMP     Component Value Date/Time   NA 137 06/01/2018 0448   NA 137 01/23/2018 1218   K 4.5 06/01/2018 0448   CL 104 06/01/2018 0448   CO2 24 06/01/2018 0448   GLUCOSE 145 (H) 06/01/2018 0448   BUN 68 (H) 06/01/2018 0448   BUN 11 01/23/2018 1218   CREATININE 3.97 (H) 06/01/2018 0448   CREATININE 0.66 06/18/2015 1855   CALCIUM 8.6 (L) 06/01/2018 0448   PROT 5.6 (L)  05/31/2018 0355   PROT 7.2 01/23/2018 1218   ALBUMIN 1.8 (L) 06/01/2018 0448   ALBUMIN 4.3 01/23/2018 1218   AST 21 05/31/2018 0355   ALT 23 05/31/2018 0355   ALKPHOS 95 05/31/2018 0355   BILITOT 0.6 05/31/2018 0355   BILITOT 0.2 01/23/2018 1218   GFRNONAA 11 (L) 06/01/2018 0448   GFRNONAA >89 01/31/2014 1532   GFRAA 13 (L) 06/01/2018 0448   GFRAA >89 01/31/2014 1532   Lipase  No results found for: LIPASE     Studies/Results: Mr Brain Wo Contrast  Result Date: 05/31/2018 CLINICAL DATA:  Follow-up examination for acute stroke. EXAM: MRI HEAD WITHOUT CONTRAST TECHNIQUE: Multiplanar, multiecho pulse sequences of the brain and surrounding structures were obtained without intravenous contrast. COMPARISON:  Prior CT from 05/26/2018. FINDINGS: Brain: Examination severely limited as the patient was unable to tolerate the full length of the exam. Axial DWI sequence, FLAIR, and T2 sequences only were performed. Additionally, images provided are severely degraded by motion artifact. Age-related cerebral atrophy with moderate chronic small vessel ischemic disease. Remote right MCA territory infarct involving the posterior right corona radiata/centrum semi ovale with extension into the overlying posterior right frontal cortical gray matter. No obvious evidence for acute ischemia on this limited exam. Gray-white matter differentiation otherwise grossly maintained. No obvious intracranial hemorrhage. No  appreciable intracranial mass on this limited study. No midline shift or hydrocephalus. No visible extra-axial collection. Vascular: Question abnormal flow void within the cervical right ICA, which may be related to slow flow and/or occlusion (series 8, image 4). Major intracranial vascular flow voids otherwise grossly maintained at the skull base. Skull and upper cervical spine: Not well assessed on this motion degraded exam. Sinuses/Orbits: Globes and orbital soft tissues grossly unremarkable, although  limited in assessment. Paranasal sinuses grossly clear. Other: None. IMPRESSION: 1. Severely limited exam due to patient's inability to tolerate the full length of the study and extensive motion artifact. 2. No obvious intracranial infarct or other acute intracranial abnormality. 3. Chronic right MCA territory infarct involving the posterior right frontal white matter and overlying right frontal cortex. 4. Question abnormal flow void within the distal cervical right ICA, which may be related to slow flow and/or occlusion. Correlation with doppler carotid ultrasound suggested if not already performed. CTA could also be considered if the patient is able to tolerate the exam. 5. Underlying atrophy with moderate chronic microvascular ischemic disease. Electronically Signed   By: Rise Mu M.D.   On: 05/31/2018 23:05   Dg Chest Port 1 View  Result Date: 05/31/2018 CLINICAL DATA:  Respiratory failure EXAM: PORTABLE CHEST 1 VIEW COMPARISON:  May 28, 2018 FINDINGS: Feeding tube tip is in the distal stomach. Central catheter tip is in the superior vena cava. No pneumothorax. There is no edema or consolidation. Heart size and pulmonary vascularity are normal. No adenopathy. No bone lesions. There is aortic atherosclerosis. IMPRESSION: No edema or consolidation. Stable cardiac silhouette. Tube and catheter positions as described. No evident pneumothorax. Aortic Atherosclerosis (ICD10-I70.0). Electronically Signed   By: Bretta Bang III M.D.   On: 05/31/2018 07:19    Anti-infectives: Anti-infectives (From admission, onward)   Start     Dose/Rate Route Frequency Ordered Stop   05/19/18 0900  meropenem (MERREM) 500 mg in sodium chloride 0.9 % 100 mL IVPB     500 mg 200 mL/hr over 30 Minutes Intravenous  Once 05/18/18 0956 05/19/18 0825   05/17/18 1130  vancomycin (VANCOCIN) IVPB 750 mg/150 ml premix  Status:  Discontinued     750 mg 150 mL/hr over 60 Minutes Intravenous Every 24 hours 05/17/18  0733 05/17/18 0919   05/16/18 0900  vancomycin (VANCOCIN) IVPB 750 mg/150 ml premix  Status:  Discontinued     750 mg 150 mL/hr over 60 Minutes Intravenous Every 24 hours 05/16/18 0745 05/17/18 0733   05/15/18 0800  vancomycin (VANCOCIN) IVPB 750 mg/150 ml premix  Status:  Discontinued     750 mg 150 mL/hr over 60 Minutes Intravenous Every 24 hours 05/14/18 0916 05/16/18 0745   04/19/2018 2200  meropenem (MERREM) 1 g in sodium chloride 0.9 % 100 mL IVPB  Status:  Discontinued     1 g 200 mL/hr over 30 Minutes Intravenous Every 12 hours 04/19/2018 1714 05/18/18 0956   05/12/18 1115  vancomycin (VANCOCIN) 1,250 mg in sodium chloride 0.9 % 250 mL IVPB     1,250 mg 166.7 mL/hr over 90 Minutes Intravenous  Once 05/12/18 1109 05/12/18 1348   05/12/18 1115  meropenem (MERREM) 500 mg in sodium chloride 0.9 % 100 mL IVPB  Status:  Discontinued     500 mg 200 mL/hr over 30 Minutes Intravenous Every 12 hours 05/12/18 1109 04/28/2018 1714   05/12/18 1109  vancomycin variable dose per unstable renal function (pharmacist dosing)  Status:  Discontinued  Does not apply See admin instructions 05/12/18 1109 05/14/18 0900   Jun 05, 2018 1800  vancomycin (VANCOCIN) IVPB 1000 mg/200 mL premix     1,000 mg 200 mL/hr over 60 Minutes Intravenous Every 12 hours 06/05/18 1357 04/25/2018 1759   June 05, 2018 0540  vancomycin (VANCOCIN) IVPB 1000 mg/200 mL premix     1,000 mg 200 mL/hr over 60 Minutes Intravenous 60 min pre-op 06-05-2018 0540 2018-06-05 1504       Assessment/Plan s/pProcedure(s): EXPLORATORY LAPAROTOMY ILEOCOLONIC ANASTOMOS CLOSURE OF ABDOMEN (N/A) COLOSTOMY (N/A)  AIOD with aortic occlusion - S/PAortobifemoral bypass wth 14 x 8 Hemashield graft, 2018/06/05, Dr. Arbie Cookey  Ischemic distal small bowel, right and left colon ischemia: - S/Pex lap with right and left colon resection, small bowel resection 2/27 Magnus Ivan)- S/Pileo-transverse colon anastomosis and left end colostomy 2/29 Janee Morn) -Midline  with wound vacin place. Continue Vac changes - Colostomy with minimal leakage. Keep vertical orientation.  - Colostomy output at goal (<1L/24 hours). Tincture of opium, Lomotil TID, ImodiumTID and Octreotide. Titrate appropriately for output.  -Electrolytes and fluid management per nephrology - CRRT off. She is net positive currently. Remains on small dose levo.  - No further surgical recs, will follow peripherally. Please call for any questions.   FEN:TF's, IVF, CRRT, NPO VTE: SCD's,heparin for CRRT BJ:[id number] 03/04, Merremonce 03/06,WBC downtrending to 11.8(3/17), afebrile  Follow up:TBD  Given good colostomy output will try stopping octreotide today.    LOS: 22 days    Berna Bue , MD [facility] 06/01/2018, 8:13 AM

## 2018-06-01 NOTE — Progress Notes (Signed)
Subjective:  - still req pressors - no UOP recorded   Objective Vital signs in last 24 hours: Vitals:   06/01/18 0400 06/01/18 0443 06/01/18 0500 06/01/18 0600  BP: 121/63  (!) 84/62 (!) 102/52  Pulse: (!) 47  (!) 47 94  Resp: 19  15 13   Temp:      TempSrc:      SpO2: 100%  98% 96%  Weight:  56.4 kg    Height:       Weight change: -0.6 kg  Intake/Output Summary (Last 24 hours) at 06/01/2018 0645 Last data filed at 06/01/2018 0600 Gross per 24 hour  Intake 1270.35 ml  Output 325 ml  Net 945.35 ml    Assessment/ Plan: Pt is a 63 y.o. yo female who was admitted on 04/17/2018 with aortobifem bypass with complication of ischemic bowel with resection and also AKI req HD in some form since 2/29  Assessment/Plan: 1. Renal- AKI as complication of aortobifem in Feb- has been HD dep in some form over the last 20 days- did not tolerate IHD, back on CRRT from 3/15 to 3/17.   Given that her numbers were SO good earlier in the week, I  held CRRT again - no UOP, will  need to resume CRRT today  2. HTN/vol - thought possibly dry- then off CRRT so essentially getting fluids , is positive fluid balance but still req pressors, HR is a little lower though- will run even with CRRT  3. Anemia- hgb increasing as of 3/14- down now.  added ESA- may need more blood today - need to use some heparin to keep machine from clotting   4. Elytes - K 4.5, phos up- mag 2.2- will resume CRRT  5. Neuro- uremia resolved but still encephalopathic - EEG unremark-  MRI done, nothing obvious- neuro following    Cecille Aver    Labs: Basic Metabolic Panel: Recent Labs  Lab 05/30/18 1600 05/31/18 0355 06/01/18 0448  NA 137 139  137 137  K 4.4 4.0  4.0 4.5  CL 105 106  111 104  CO2 24 25  22 24   GLUCOSE 144* 124*  128* 145*  BUN 15 33*  33* 68*  CREATININE 0.96 1.95*  1.90* 3.97*  CALCIUM 7.9* 7.6*  7.1* 8.6*  PHOS 1.4* 3.2 7.1*  6.9*   Liver Function Tests: Recent Labs  Lab 05/30/18 1600  05/31/18 0355 06/01/18 0448  AST  --  21  --   ALT  --  23  --   ALKPHOS  --  95  --   BILITOT  --  0.6  --   PROT  --  5.6*  --   ALBUMIN 1.8* 1.7*  1.6* 1.8*   No results for input(s): LIPASE, AMYLASE in the last 168 hours. Recent Labs  Lab 05/26/18 1042  AMMONIA 32   CBC: Recent Labs  Lab 05/27/18 0927 05/30/18 0452 05/30/18 0900 05/31/18 0355 06/01/18 0448  WBC 14.8* 11.8* 12.9* 12.3* 14.3*  HGB 9.5* 7.5* 7.5* 7.2* 6.8*  HCT 29.1* 24.0* 24.4* 22.4* 22.5*  MCV 93.3 95.2 96.4 96.1 98.3  PLT 394 256 245 255 251   Cardiac Enzymes: No results for input(s): CKTOTAL, CKMB, CKMBINDEX, TROPONINI in the last 168 hours. CBG: Recent Labs  Lab 05/31/18 1138 05/31/18 1547 05/31/18 2000 05/31/18 2357 06/01/18 0357  GLUCAP 107* 129* 167* 142* 124*    Iron Studies:  Recent Labs    05/31/18 0355  IRON 33  TIBC 199*  FERRITIN 832*   Studies/Results: Mr Brain Wo Contrast  Result Date: 05/31/2018 CLINICAL DATA:  Follow-up examination for acute stroke. EXAM: MRI HEAD WITHOUT CONTRAST TECHNIQUE: Multiplanar, multiecho pulse sequences of the brain and surrounding structures were obtained without intravenous contrast. COMPARISON:  Prior CT from 05/26/2018. FINDINGS: Brain: Examination severely limited as the patient was unable to tolerate the full length of the exam. Axial DWI sequence, FLAIR, and T2 sequences only were performed. Additionally, images provided are severely degraded by motion artifact. Age-related cerebral atrophy with moderate chronic small vessel ischemic disease. Remote right MCA territory infarct involving the posterior right corona radiata/centrum semi ovale with extension into the overlying posterior right frontal cortical gray matter. No obvious evidence for acute ischemia on this limited exam. Gray-white matter differentiation otherwise grossly maintained. No obvious intracranial hemorrhage. No appreciable intracranial mass on this limited study. No midline  shift or hydrocephalus. No visible extra-axial collection. Vascular: Question abnormal flow void within the cervical right ICA, which may be related to slow flow and/or occlusion (series 8, image 4). Major intracranial vascular flow voids otherwise grossly maintained at the skull base. Skull and upper cervical spine: Not well assessed on this motion degraded exam. Sinuses/Orbits: Globes and orbital soft tissues grossly unremarkable, although limited in assessment. Paranasal sinuses grossly clear. Other: None. IMPRESSION: 1. Severely limited exam due to patient's inability to tolerate the full length of the study and extensive motion artifact. 2. No obvious intracranial infarct or other acute intracranial abnormality. 3. Chronic right MCA territory infarct involving the posterior right frontal white matter and overlying right frontal cortex. 4. Question abnormal flow void within the distal cervical right ICA, which may be related to slow flow and/or occlusion. Correlation with doppler carotid ultrasound suggested if not already performed. CTA could also be considered if the patient is able to tolerate the exam. 5. Underlying atrophy with moderate chronic microvascular ischemic disease. Electronically Signed   By: Rise Mu M.D.   On: 05/31/2018 23:05   Dg Chest Port 1 View  Result Date: 05/31/2018 CLINICAL DATA:  Respiratory failure EXAM: PORTABLE CHEST 1 VIEW COMPARISON:  May 28, 2018 FINDINGS: Feeding tube tip is in the distal stomach. Central catheter tip is in the superior vena cava. No pneumothorax. There is no edema or consolidation. Heart size and pulmonary vascularity are normal. No adenopathy. No bone lesions. There is aortic atherosclerosis. IMPRESSION: No edema or consolidation. Stable cardiac silhouette. Tube and catheter positions as described. No evident pneumothorax. Aortic Atherosclerosis (ICD10-I70.0). Electronically Signed   By: Bretta Bang III M.D.   On: 05/31/2018 07:19    Medications: Infusions: .  prismasol BGK 4/2.5 500 mL/hr at 05/30/18 1016  .  prismasol BGK 4/2.5 500 mL/hr at 05/30/18 1019  . sodium chloride Stopped (05/30/18 1056)  . dexmedetomidine (PRECEDEX) IV infusion 1.2 mcg/kg/hr (06/01/18 0600)  . feeding supplement (VITAL AF 1.2 CAL) 1,000 mL (05/31/18 1259)  . heparin 10,000 units/ 20 mL infusion syringe 1,050 Units/hr (05/30/18 1712)  . norepinephrine (LEVOPHED) Adult infusion 2 mcg/min (06/01/18 0600)  . prismasol BGK 4/2.5 2,000 mL/hr at 05/30/18 1658    Scheduled Medications: . atorvastatin  80 mg Per Tube Daily  . chlorhexidine  15 mL Mouth Rinse BID  . Chlorhexidine Gluconate Cloth  6 each Topical Daily  . darbepoetin (ARANESP) injection - NON-DIALYSIS  100 mcg Subcutaneous Q Tue-1800  . feeding supplement (PRO-STAT SUGAR FREE 64)  30 mL Per Tube TID  . Gerhardt's butt cream   Topical BID  .  insulin aspart  0-9 Units Subcutaneous Q4H  . loperamide HCl  4 mg Per Tube TID  . mouth rinse  15 mL Mouth Rinse q12n4p  . multivitamin  1 tablet Oral QHS  . octreotide  50 mcg Intravenous Q12H  . Opium  5 mg Per Tube Q6H  . pantoprazole sodium  40 mg Per Tube Daily  . risperiDONE  0.5 mg Per Tube BID  . sodium chloride flush  10-40 mL Intracatheter Q12H    have reviewed scheduled and prn medications.  Physical Exam: General: alert, pleasantly confused-  Heart: tachy  Lungs: mostly clear Abdomen: soft Extremities: no edema- no stigmata of CES on toes- heel protectors  Dialysis Access: right IJ vascath placed 3/15    06/01/2018,6:45 AM  LOS: 22 days

## 2018-06-02 DIAGNOSIS — Z515 Encounter for palliative care: Secondary | ICD-10-CM

## 2018-06-02 LAB — TYPE AND SCREEN
ABO/RH(D): A POS
Antibody Screen: NEGATIVE
Unit division: 0

## 2018-06-02 LAB — POCT ACTIVATED CLOTTING TIME
Activated Clotting Time: 197 seconds
Activated Clotting Time: 202 seconds
Activated Clotting Time: 219 seconds

## 2018-06-02 LAB — CBC
HCT: 30.2 % — ABNORMAL LOW (ref 36.0–46.0)
Hemoglobin: 10 g/dL — ABNORMAL LOW (ref 12.0–15.0)
MCH: 30.1 pg (ref 26.0–34.0)
MCHC: 33.1 g/dL (ref 30.0–36.0)
MCV: 91 fL (ref 80.0–100.0)
Platelets: 229 10*3/uL (ref 150–400)
RBC: 3.32 MIL/uL — AB (ref 3.87–5.11)
RDW: 19 % — ABNORMAL HIGH (ref 11.5–15.5)
WBC: 16 10*3/uL — ABNORMAL HIGH (ref 4.0–10.5)
nRBC: 0.1 % (ref 0.0–0.2)

## 2018-06-02 LAB — RENAL FUNCTION PANEL
ANION GAP: 9 (ref 5–15)
Albumin: 2.1 g/dL — ABNORMAL LOW (ref 3.5–5.0)
Albumin: 2.2 g/dL — ABNORMAL LOW (ref 3.5–5.0)
Anion gap: 11 (ref 5–15)
BUN: 24 mg/dL — ABNORMAL HIGH (ref 8–23)
BUN: 15 mg/dL (ref 8–23)
CHLORIDE: 102 mmol/L (ref 98–111)
CO2: 25 mmol/L (ref 22–32)
CO2: 24 mmol/L (ref 22–32)
Calcium: 8.6 mg/dL — ABNORMAL LOW (ref 8.9–10.3)
Calcium: 8.8 mg/dL — ABNORMAL LOW (ref 8.9–10.3)
Chloride: 102 mmol/L (ref 98–111)
Creatinine, Ser: 1.34 mg/dL — ABNORMAL HIGH (ref 0.44–1.00)
Creatinine, Ser: 1.16 mg/dL — ABNORMAL HIGH (ref 0.44–1.00)
GFR calc Af Amer: 49 mL/min — ABNORMAL LOW (ref >60)
GFR calc Af Amer: 58 mL/min — ABNORMAL LOW (ref >60)
GFR calc non Af Amer: 42 mL/min — ABNORMAL LOW (ref >60)
GFR calc non Af Amer: 50 mL/min — ABNORMAL LOW (ref >60)
Glucose, Bld: 157 mg/dL — ABNORMAL HIGH (ref 70–99)
Glucose, Bld: 166 mg/dL — ABNORMAL HIGH (ref 70–99)
POTASSIUM: 4.5 mmol/L (ref 3.5–5.1)
Phosphorus: 2.5 mg/dL (ref 2.5–4.6)
Phosphorus: 3.1 mg/dL (ref 2.5–4.6)
Potassium: 4.8 mmol/L (ref 3.5–5.1)
Sodium: 136 mmol/L (ref 135–145)
Sodium: 137 mmol/L (ref 135–145)

## 2018-06-02 LAB — GLUCOSE, CAPILLARY
GLUCOSE-CAPILLARY: 131 mg/dL — AB (ref 70–99)
GLUCOSE-CAPILLARY: 134 mg/dL — AB (ref 70–99)
GLUCOSE-CAPILLARY: 153 mg/dL — AB (ref 70–99)
Glucose-Capillary: 144 mg/dL — ABNORMAL HIGH (ref 70–99)
Glucose-Capillary: 158 mg/dL — ABNORMAL HIGH (ref 70–99)
Glucose-Capillary: 172 mg/dL — ABNORMAL HIGH (ref 70–99)

## 2018-06-02 LAB — BPAM RBC
Blood Product Expiration Date: 202004092359
ISSUE DATE / TIME: 202003191133
UNIT TYPE AND RH: 6200

## 2018-06-02 LAB — MAGNESIUM: Magnesium: 2.1 mg/dL (ref 1.7–2.4)

## 2018-06-02 LAB — APTT: aPTT: >200 seconds (ref 24–36)

## 2018-06-02 MED ORDER — SODIUM CHLORIDE 0.9 % IV SOLN
INTRAVENOUS | Status: AC
  Administered 2018-06-02: 08:00:00 via INTRAVENOUS

## 2018-06-02 MED ORDER — OCTREOTIDE ACETATE 50 MCG/ML IJ SOLN
50.0000 ug | Freq: Two times a day (BID) | INTRAMUSCULAR | Status: DC
  Administered 2018-06-02 (×2): 50 ug via INTRAVENOUS
  Filled 2018-06-02 (×4): qty 1

## 2018-06-02 MED ORDER — VITAL AF 1.2 CAL PO LIQD
1000.0000 mL | ORAL | Status: DC
  Administered 2018-06-03: 1000 mL

## 2018-06-02 MED ORDER — MIDODRINE HCL 5 MG PO TABS
5.0000 mg | ORAL_TABLET | Freq: Three times a day (TID) | ORAL | Status: DC
  Administered 2018-06-02 – 2018-06-03 (×3): 5 mg via ORAL
  Filled 2018-06-02 (×6): qty 1

## 2018-06-02 MED ORDER — SODIUM PHOSPHATES 45 MMOLE/15ML IV SOLN
20.0000 mmol | Freq: Once | INTRAVENOUS | Status: AC
  Administered 2018-06-02: 20 mmol via INTRAVENOUS
  Filled 2018-06-02: qty 6.67

## 2018-06-02 MED ORDER — NOREPINEPHRINE 16 MG/250ML-% IV SOLN
0.0000 ug/min | INTRAVENOUS | Status: DC
  Administered 2018-06-02: 5 ug/min via INTRAVENOUS
  Administered 2018-06-03: 10 ug/min via INTRAVENOUS
  Filled 2018-06-02 (×2): qty 250

## 2018-06-02 NOTE — Progress Notes (Signed)
Spoke to Jeddo regarding HR in the 130s.Has been on levo for a few days. States that if it gets above 140s we can try to switch to neo. Will be rounding soon. Huntley Estelle E, RN 06/02/2018 11:35 AM

## 2018-06-02 NOTE — Progress Notes (Signed)
Notified Dr.Coladonato about critical APTT, >200. Decreased heparin syringe by half in CRRT machine per verbal order. Will continue to monitor.

## 2018-06-02 NOTE — Progress Notes (Signed)
Physical Therapy Treatment Patient Details Name: Gwendolyn Pham MRN: [id number] DOB: Apr 22, 1955 Today's Date: 06/02/2018    History of Present Illness Pt is a 63 year old woman admitted 04/28/2018 with aortic occlusion, now s/p aortobifemoral BPG.  2/27 had hypotension and respiratory distress.  Intubated that night.  Extubated 05/18/18.  CRRT from 2/29/2022 to 05/18/2018. Pt with brief cardiac arrest during HD on 3/14. PMH: smoker, anxiety, depression, DM, asthma, HTN.    PT Comments    Bed level session due to CRRT/ poor cognition. Performed AAROM and PROM all extremities. Will cont to follow and progress mobility as able.     Follow Up Recommendations  CIR;Supervision/Assistance - 24 hour     Equipment Recommendations  Other (comment)    Recommendations for Other Services Rehab consult     Precautions / Restrictions Precautions Precautions: Fall Precaution Comments: cortrack, abdominal vac, colostomy Restrictions Weight Bearing Restrictions: No    Mobility  Bed Mobility                  Transfers                    Ambulation/Gait                 Stairs             Wheelchair Mobility    Modified Rankin (Stroke Patients Only)       Balance Overall balance assessment: Needs assistance Sitting-balance support: Bilateral upper extremity supported;Feet supported Sitting balance-Leahy Scale: Zero Sitting balance - Comments: posterior lean Postural control: Posterior lean                                  Cognition Arousal/Alertness: Lethargic Behavior During Therapy: Restless;Flat affect Overall Cognitive Status: Impaired/Different from baseline Area of Impairment: Following commands;Attention;Safety/judgement                 Orientation Level: Disoriented to;Situation;Place Current Attention Level: Focused   Following Commands: Follows one step commands inconsistently;Follows one step commands with increased  time Safety/Judgement: Decreased awareness of safety;Decreased awareness of deficits   Problem Solving: Slow processing;Decreased initiation;Difficulty sequencing;Requires verbal cues;Requires tactile cues General Comments: patient AOx0, unabel to follow cues, sedated and lethargic, confused. per RN likely muti factoral       Exercises General Exercises - Upper Extremity Shoulder Flexion: 10 reps Shoulder Extension: 5 reps Shoulder ABduction: 10 reps Shoulder Horizontal ABduction: 10 reps Shoulder Horizontal ADduction: 10 reps Elbow Flexion: 10 reps Elbow Extension: 5 reps General Exercises - Lower Extremity Ankle Circles/Pumps: 20 reps Quad Sets: 10 reps Heel Slides: 20 reps Hip ABduction/ADduction: 20 reps Straight Leg Raises: 20 reps    General Comments        Pertinent Vitals/Pain Pain Assessment: No/denies pain Pain Score: 0-No pain Faces Pain Scale: Hurts a little bit Pain Location: abdomen Pain Descriptors / Indicators: Discomfort    Home Living                      Prior Function            PT Goals (current goals can now be found in the care plan section) Acute Rehab PT Goals Patient Stated Goal: unable to state PT Goal Formulation: Patient unable to participate in goal setting Time For Goal Achievement: 06/02/18 Potential to Achieve Goals: Fair Progress towards PT goals: Progressing toward goals  Frequency    Min 2X/week      PT Plan Current plan remains appropriate    Co-evaluation              AM-PAC PT "6 Clicks" Mobility   Outcome Measure  Help needed turning from your back to your side while in a flat bed without using bedrails?: Total Help needed moving from lying on your back to sitting on the side of a flat bed without using bedrails?: Total Help needed moving to and from a bed to a chair (including a wheelchair)?: Total Help needed standing up from a chair using your arms (e.g., wheelchair or bedside chair)?:  Total Help needed to walk in hospital room?: Total Help needed climbing 3-5 steps with a railing? : Total 6 Click Score: 6    End of Session   Activity Tolerance: Patient limited by fatigue;Patient limited by lethargy Patient left: in bed;with call bell/phone within reach;with bed alarm set;with restraints reapplied Nurse Communication: Mobility status PT Visit Diagnosis: Unsteadiness on feet (R26.81);Difficulty in walking, not elsewhere classified (R26.2)     Time: 0017-4944 PT Time Calculation (min) (ACUTE ONLY): 9 min  Charges:  $Therapeutic Exercise: 8-22 mins                    Etta Grandchild, PT, DPT Acute Rehabilitation Services Pager: [phone] Office: [phone]     Etta Grandchild 06/02/2018, 4:53 PM

## 2018-06-02 NOTE — Progress Notes (Signed)
  Patient seen, chart reviewed.  No family at the bedside.  Per nursing, patient's sister, [name], is healthcare power of attorney.  I have reached Ms. Linde Gillis and scheduled initial goals of care conversation for 06/03/2018 at 0900 Thank you, Eduard Roux, ANP

## 2018-06-02 NOTE — Progress Notes (Signed)
Subjective:  - still req pressors - no UOP recorded- CRRT restarted 3/19   Objective Vital signs in last 24 hours: Vitals:   06/02/18 0630 06/02/18 0645 06/02/18 0700 06/02/18 0715  BP: (!) 84/65 (!) 81/66 95/64 119/88  Pulse:      Resp: 19 12 19  (!) 21  Temp:    (!) 97.2 F (36.2 C)  TempSrc:    Oral  SpO2: 99%     Weight:      Height:       Weight change: 6.6 kg  Intake/Output Summary (Last 24 hours) at 06/02/2018 6644 Last data filed at 06/02/2018 0700 Gross per 24 hour  Intake 2355.34 ml  Output 3405 ml  Net -1049.66 ml    Assessment/ Plan: Pt is a 63 y.o. yo female who was admitted on 05/02/2018 with aortobifem bypass with complication of ischemic bowel with resection and also AKI req HD in some form since 2/29  Assessment/Plan: 1. Renal- AKI as complication of aortobifem in Feb- has been HD dep in some form over the last 21 days- did not tolerate IHD, back on CRRT from 3/15 to 3/17.  Then held and resumed 3/19 via temp cath.  The more time that goes by makes it less likely that she could recover renal function  2. HTN/vol - thought possibly dry- lots of ostomy output- then off CRRT so essentially getting fluids , is positive fluid balance but still req pressors- will run even with CRRT- give more volume today   3. Anemia- hgb increasing as of 3/14- down now.  added ESA- s/p transfusion 3/19 - need to use some heparin to keep machine from clotting   4. Elytes - K 4.5, phos 2.5- mag 2.2-  Give some phos 5. Neuro- uremia resolved but still encephalopathic - EEG unremark-  MRI done, nothing obvious- neuro following  6. Dispo- agree with palliative care evaluation   Cecille Aver    Labs: Basic Metabolic Panel: Recent Labs  Lab 06/01/18 0448 06/01/18 1526 06/02/18 0417  NA 137 139 136  K 4.5 4.1 4.5  CL 104 103 102  CO2 24 25 25   GLUCOSE 145* 137* 157*  BUN 68* 37* 24*  CREATININE 3.97* 2.02* 1.34*  CALCIUM 8.6* 8.4* 8.6*  PHOS 7.1*  6.9* 3.3 2.5    Liver Function Tests: Recent Labs  Lab 05/31/18 0355 06/01/18 0448 06/01/18 1526 06/02/18 0417  AST 21  --   --   --   ALT 23  --   --   --   ALKPHOS 95  --   --   --   BILITOT 0.6  --   --   --   PROT 5.6*  --   --   --   ALBUMIN 1.7*  1.6* 1.8* 2.1* 2.1*   No results for input(s): LIPASE, AMYLASE in the last 168 hours. Recent Labs  Lab 05/26/18 1042  AMMONIA 32   CBC: Recent Labs  Lab 05/30/18 0452 05/30/18 0900 05/31/18 0355 06/01/18 0448 06/02/18 0417  WBC 11.8* 12.9* 12.3* 14.3* 16.0*  HGB 7.5* 7.5* 7.2* 6.8* 10.0*  HCT 24.0* 24.4* 22.4* 22.5* 30.2*  MCV 95.2 96.4 96.1 98.3 91.0  PLT 256 245 255 251 229   Cardiac Enzymes: No results for input(s): CKTOTAL, CKMB, CKMBINDEX, TROPONINI in the last 168 hours. CBG: Recent Labs  Lab 06/01/18 1608 06/01/18 1936 06/01/18 2320 06/02/18 0327 06/02/18 0724  GLUCAP 127* 118* 138* 144* 158*    Iron Studies:  Recent Labs    05/31/18 0355  IRON 33  TIBC 199*  FERRITIN 832*   Studies/Results: Mr Brain Wo Contrast  Result Date: 05/31/2018 CLINICAL DATA:  Follow-up examination for acute stroke. EXAM: MRI HEAD WITHOUT CONTRAST TECHNIQUE: Multiplanar, multiecho pulse sequences of the brain and surrounding structures were obtained without intravenous contrast. COMPARISON:  Prior CT from 05/26/2018. FINDINGS: Brain: Examination severely limited as the patient was unable to tolerate the full length of the exam. Axial DWI sequence, FLAIR, and T2 sequences only were performed. Additionally, images provided are severely degraded by motion artifact. Age-related cerebral atrophy with moderate chronic small vessel ischemic disease. Remote right MCA territory infarct involving the posterior right corona radiata/centrum semi ovale with extension into the overlying posterior right frontal cortical gray matter. No obvious evidence for acute ischemia on this limited exam. Gray-white matter differentiation otherwise grossly  maintained. No obvious intracranial hemorrhage. No appreciable intracranial mass on this limited study. No midline shift or hydrocephalus. No visible extra-axial collection. Vascular: Question abnormal flow void within the cervical right ICA, which may be related to slow flow and/or occlusion (series 8, image 4). Major intracranial vascular flow voids otherwise grossly maintained at the skull base. Skull and upper cervical spine: Not well assessed on this motion degraded exam. Sinuses/Orbits: Globes and orbital soft tissues grossly unremarkable, although limited in assessment. Paranasal sinuses grossly clear. Other: None. IMPRESSION: 1. Severely limited exam due to patient's inability to tolerate the full length of the study and extensive motion artifact. 2. No obvious intracranial infarct or other acute intracranial abnormality. 3. Chronic right MCA territory infarct involving the posterior right frontal white matter and overlying right frontal cortex. 4. Question abnormal flow void within the distal cervical right ICA, which may be related to slow flow and/or occlusion. Correlation with doppler carotid ultrasound suggested if not already performed. CTA could also be considered if the patient is able to tolerate the exam. 5. Underlying atrophy with moderate chronic microvascular ischemic disease. Electronically Signed   By: Rise Mu M.D.   On: 05/31/2018 23:05   Medications: Infusions: .  prismasol BGK 4/2.5 300 mL/hr at 06/02/18 0317  .  prismasol BGK 4/2.5 400 mL/hr at 06/01/18 2310  . sodium chloride Stopped (05/30/18 1056)  . dexmedetomidine (PRECEDEX) IV infusion 0.6 mcg/kg/hr (06/02/18 0700)  . feeding supplement (VITAL AF 1.2 CAL) 1,000 mL (05/31/18 1259)  . heparin 10,000 units/ 20 mL infusion syringe 550 Units/hr (06/02/18 0600)  . norepinephrine (LEVOPHED) Adult infusion 4 mcg/min (06/02/18 0700)  . prismasol BGK 4/2.5 1,500 mL/hr at 06/02/18 1829    Scheduled Medications: .  sodium chloride   Intravenous Once  . atorvastatin  80 mg Per Tube Daily  . chlorhexidine  15 mL Mouth Rinse BID  . Chlorhexidine Gluconate Cloth  6 each Topical Daily  . darbepoetin (ARANESP) injection - NON-DIALYSIS  100 mcg Subcutaneous Q Tue-1800  . feeding supplement (PRO-STAT SUGAR FREE 64)  30 mL Per Tube TID  . Gerhardt's butt cream   Topical BID  . insulin aspart  0-9 Units Subcutaneous Q4H  . loperamide HCl  4 mg Per Tube TID  . mouth rinse  15 mL Mouth Rinse q12n4p  . multivitamin  1 tablet Oral QHS  . Opium  5 mg Per Tube Q6H  . pantoprazole sodium  40 mg Per Tube Daily  . risperiDONE  1 mg Per Tube BID  . sodium chloride flush  10-40 mL Intracatheter Q12H    have reviewed scheduled and prn  medications.  Physical Exam: General: more sedated today  Heart: tachy  Lungs: mostly clear Abdomen: soft Extremities: no edema- no stigmata of CES on toes- heel protectors  Dialysis Access: right IJ vascath placed 3/15    06/02/2018,7:29 AM  LOS: 23 days

## 2018-06-02 NOTE — Consult Note (Signed)
WOC Consult: Surgical team following for assessment and plan of care for abd wound;  discontinued wound vac per order and placed dry dressing on mid abdominal vertical incision. Please refer to their team for further questions regarding plan of care.  Wound: Vertical, surgical incision, not approximated, beefy red 100% of wound bed. Drainage: small amount of creamy colored drainage, no odor noted Periwound: intact Measurements:  Pt tolerated dressing change well.   Ostomy pouch intact with good seal, changed by bedside nurse last night.  Supplies at bedside for staff nurses use.  Large amt yellow liquid stool, attached to bedside drainage bag. WOC team will continue to follow weekly for ostomy teaching when stable and out of ICU. Janna Arch. Dahlia Client, MSN Student Darlin Coco, WOC RN, MSN

## 2018-06-02 NOTE — Progress Notes (Signed)
Called sister of patient to inform her of BP dropping with no relief, will page CCM, and will continue to monitor. Tamsen Meek, RN 06/02/2018 5:42 PM

## 2018-06-02 NOTE — Progress Notes (Signed)
Patient ID: ISMAEL WNEK, female   DOB: 1956/02/04, 63 y.o.   MRN: [id number] No real change in clinical status.  Confused Discussed with Dr. Vassie Loll.  Continue full support.  Palliative care discussion.

## 2018-06-02 NOTE — Progress Notes (Signed)
Verbal order from Renal to stop checking ACT's as PTT has been greater than 200 and patient's machine clots off without heparin infusing through the system. Will continue to monitor closely. Huntley Estelle E, RN 06/02/2018 7:59 AM

## 2018-06-02 NOTE — Progress Notes (Signed)
SLP Cancellation Note  Patient Details Name: Gwendolyn Pham MRN: [id number] DOB: 11-04-1955   Cancelled treatment:  Pt just finished limited session with PT;  Not sufficiently alert for PO trials per observation.  Will continue efforts.         Blenda Mounts Laurice 06/02/2018, 5:10 PM

## 2018-06-02 NOTE — Progress Notes (Addendum)
Nutrition Follow-up  DOCUMENTATION CODES:   Not applicable  INTERVENTION:   Continue TF via Cortrak tube:  Increase Vital AF 1.2 goal rate to 70 ml/h  D/C Pro-stat   Provides 2016 kcal, 126 gm protein, 1362 ml free water daily  NUTRITION DIAGNOSIS:   Inadequate oral intake related to dysphagia as evidenced by NPO status.  Ongoing   GOAL:   Patient will meet greater than or equal to 90% of their needs  Met with TF  MONITOR:   TF tolerance, Diet advancement, Labs, Skin, Weight trends, I & O's  ASSESSMENT:   Pt with PMH T2DM, COPD, HTN, HLD and depression. Presented to [facility] for aortobifemoral bypass procedure on 2/26. Found to have extensive necrotic bowel which was all resected 2/27.   2/26 s/p aortobifemoral bypass for aortoiliac occlusive disease w/aortic occlusion 2/27 intubated for respiratory insufficiency 2/27 s/p exp lap; found ischemic bowel/colon 2/29 s/p ileocolonic anastomosis, abd closure &colostomy  3/02 started trickle feeds via OGT 3/04 Cortrak placed; tip in stomach 3/05 extubated 3/17 CRRT stopped 3/20 CRRT resumed  Labs reviewed. K & Phos WNL. BUN 24 (H), creatinine 1.34 (H) CBG's: 144-158-131 Medications reviewed and include novolog, rena-vit, octreotide.  Patient is currently receiving Vital AF 1.2 via Cortrak at 50 ml/h (1200 ml/day) with Prostat 30 ml TID.   Palliative Care Team has been consulted with plans for family meeting 3/21.    Diet Order:   Diet Order            Diet NPO time specified  Diet effective now              EDUCATION NEEDS:   Not appropriate for education at this time  Skin:  Skin Assessment: Skin Integrity Issues: Skin Integrity Issues:: Other (Comment) Incisions: Abdomen, groin Other: MASD to coccyx & sacrum  Last BM:  1350 ml output from colostomy 3/19  Height:   Ht Readings from Last 1 Encounters:  05/31/18 5' 4"  (1.626 m)    Weight:   Wt Readings from Last 1 Encounters:  06/02/18 58.4 kg     Ideal Body Weight:  56.8 kg  BMI:  Body mass index is 22.1 kg/m.  Estimated Nutritional Needs:   Kcal:  5110-2111  Protein:  115-145 gm  Fluid:  1 L + UOP    Molli Barrows, RD, LDN, [place] Pager [phone] After Hours Pager [phone]

## 2018-06-02 NOTE — Progress Notes (Addendum)
NAME:  GROVER ZOOK, MRN:  [id number], DOB:  03-Jul-1955, LOS: 23 ADMISSION DATE:  04/29/2018, CONSULTATION DATE:  05/27/2018 REFERRING MD:  Hyman Hopes - Nephrology, CHIEF COMPLAINT:  Acute renal failure.   HPI/course in hospital  63 year old woman with complicated course following aorto-bifemoral bypass performed 2/26 for severe claudication with aortic occlusion.  Complicated course thereafter by ischemic bowel with resection of distal small bowel and partial colectomy with eventual closure of the abdomen after a multistep surgery on 2/29. She has developed AKI and required HD. She has poor would healing. She had hypotension and did not tolerate IHD & developed confusion due to uremia.   3/14 she sustained a brief cardiac arrest on initiation of HD.   RIJ HD cath 3/15 >>   Significant tests/ events reviewed 3/17 CRRT stopped 3/18 creat doubled 3/18 MRI brain-chronic right MCA infarct 3/19 CRRT resumed    Interim history/subjective:   Remains critically ill, delirious on lower dose Precedex drip. Still remains intermittently agitated Afebrile on CRRT, on low-dose Levophed   Objective   Blood pressure 96/83, pulse (!) 117, temperature (!) 97.2 F (36.2 C), temperature source Oral, resp. rate 19, height 5\' 4"  (1.626 m), weight 58.4 kg, SpO2 98 %.        Intake/Output Summary (Last 24 hours) at 06/02/2018 6203 Last data filed at 06/02/2018 0800 Gross per 24 hour  Intake 2251.62 ml  Output 3475 ml  Net -1223.38 ml   Filed Weights   06/01/18 0443 06/01/18 0900 06/02/18 0500  Weight: 56.4 kg 63 kg 58.4 kg    Examination: Chronically ill-appearing, older than stated age, less agitation remains in four-point restraints and Posey Pallor, no icterus No JVD, dialysis catheter no bleeding Decreased breath sounds bilateral S1-S2 regular Soft nontender abdomen, creased stool in ostomy bag, minimal drainage from wound VAC Does not follow one-step commands, could not tell me her name,   moves all 4 extremities 1+ edema   Assessment & Plan:   Acute encephalopathy: Due to ICU delirium Head CT 3/13 old right frontal/MCA infarct ,?  Small left thalamus infarct versus artifact EEG 3/13?  Triphasic waves more prominent on the left versus artifact MRI-did not show any new infarcts  -Taper Precedex to off, goal RASS 0 - Fentanyl for pain -Continue Risperdal 1mg  twice daily  -Discontinue Ativan              Hypotension-probably related to Precedex, postarrest Await echo Continue low-dose Levophed   AKI : HD dep in some form since 2/29/ /2020 - did not tolerate IHD, needed CRRT-  3/15 -3/17 Remains anuric and creatinine has  doubled every day Plan -Back on CRRT -unfortunately she has not tolerated intermittent dialysis due to hypotension Not clear that she is a candidate for long-term dialysis   Ischemic  Bowel -status post right and left colon resection, small bowel resection 2/27 with left end colostomy 2/29 and ileocolonic anastomosis Stoma looks pink Plan: -Postop management of ostomy and wound VAC per wound care>> Surgery has signed off,  - Tolerating TF - Remains on  imodium and tincture of opium for high output  -Resume octreotide   Diabetes type 2-SSI - CBG Q 4 - SSI -Continue tube feeds.  Leukocytosis Stable off antibiotics Plan Trend CBC  Anemia of critical illness Drop in HGB from 9.5- 6.8 _ 1 u PRBC   Plan On ESA Transfuse for HGB less than 7 No signs of obvious bleeding Hold Heparin>> SCD's ordered   Updated  sister on a daily basis, DNR issued, goals of care ongoing Not sure that she is a candidate for long-term dialysis.  Will involve palliative care, extremely poor prognosis if Does not resolve  The patient is critically ill with multiple organ systems failure and requires high complexity decision making for assessment and support, frequent evaluation and titration of therapies, application of advanced monitoring technologies and  extensive interpretation of multiple databases. Critical Care Time devoted to patient care services described in this note independent of APP/resident  time is 31 minutes.     Cyril Mourning MD. Tonny Bollman. [facility] Pager [phone] If no response call [phone]    06/02/2018  9:07 AM

## 2018-06-03 DIAGNOSIS — Z515 Encounter for palliative care: Secondary | ICD-10-CM

## 2018-06-03 DIAGNOSIS — Z7189 Other specified counseling: Secondary | ICD-10-CM

## 2018-06-03 LAB — RENAL FUNCTION PANEL
Albumin: 2 g/dL — ABNORMAL LOW (ref 3.5–5.0)
Anion gap: 9 (ref 5–15)
BUN: 12 mg/dL (ref 8–23)
CO2: 25 mmol/L (ref 22–32)
Calcium: 8.7 mg/dL — ABNORMAL LOW (ref 8.9–10.3)
Chloride: 103 mmol/L (ref 98–111)
Creatinine, Ser: 1.03 mg/dL — ABNORMAL HIGH (ref 0.44–1.00)
GFR calc Af Amer: >60 mL/min (ref >60)
GFR calc non Af Amer: 58 mL/min — ABNORMAL LOW (ref >60)
Glucose, Bld: 146 mg/dL — ABNORMAL HIGH (ref 70–99)
Phosphorus: 1.8 mg/dL — ABNORMAL LOW (ref 2.5–4.6)
Potassium: 4.5 mmol/L (ref 3.5–5.1)
Sodium: 137 mmol/L (ref 135–145)

## 2018-06-03 LAB — GLUCOSE, CAPILLARY
Glucose-Capillary: 134 mg/dL — ABNORMAL HIGH (ref 70–99)
Glucose-Capillary: 134 mg/dL — ABNORMAL HIGH (ref 70–99)

## 2018-06-03 LAB — APTT: aPTT: 100 seconds — ABNORMAL HIGH (ref 24–36)

## 2018-06-03 LAB — MAGNESIUM: Magnesium: 2.4 mg/dL (ref 1.7–2.4)

## 2018-06-03 MED ORDER — HYDROMORPHONE HCL 1 MG/ML IJ SOLN: 0.5000 mg | INTRAMUSCULAR | Status: DC | PRN

## 2018-06-03 MED ORDER — MIDAZOLAM HCL 2 MG/2ML IJ SOLN
2.0000 mg | INTRAMUSCULAR | Status: DC | PRN
  Administered 2018-06-03: 4 mg via INTRAVENOUS
  Administered 2018-06-03 (×3): 2 mg via INTRAVENOUS
  Filled 2018-06-03: qty 4
  Filled 2018-06-03 (×3): qty 2

## 2018-06-03 MED ORDER — GLYCOPYRROLATE 0.2 MG/ML IJ SOLN: 0.4000 mg | INTRAMUSCULAR | Status: DC | PRN

## 2018-06-03 MED ORDER — MIDAZOLAM 50MG/50ML (1MG/ML) PREMIX INFUSION: 2.0000 mg/h | INTRAVENOUS | Status: DC

## 2018-06-03 MED ORDER — GLYCOPYRROLATE 0.2 MG/ML IJ SOLN
0.4000 mg | Freq: Four times a day (QID) | INTRAMUSCULAR | Status: DC
  Administered 2018-06-03 (×2): 0.4 mg via INTRAVENOUS
  Filled 2018-06-03 (×2): qty 2

## 2018-06-03 MED ORDER — HYDROMORPHONE BOLUS VIA INFUSION: 0.5000 mg | INTRAVENOUS | Status: DC | PRN

## 2018-06-03 MED ORDER — RISPERIDONE 1 MG PO TBDP
1.0000 mg | ORAL_TABLET | Freq: Two times a day (BID) | ORAL | Status: DC
  Administered 2018-06-03: 1 mg via ORAL
  Filled 2018-06-03 (×2): qty 1

## 2018-06-03 MED ORDER — MIDAZOLAM BOLUS VIA INFUSION
2.0000 mg | INTRAVENOUS | Status: DC | PRN
  Filled 2018-06-03: qty 10

## 2018-06-03 MED ORDER — HYDROMORPHONE HCL 1 MG/ML IJ SOLN
0.5000 mg | INTRAMUSCULAR | Status: DC | PRN
  Administered 2018-06-03 (×2): 1 mg via INTRAVENOUS
  Filled 2018-06-03 (×3): qty 4

## 2018-06-03 MED ORDER — HALOPERIDOL LACTATE 5 MG/ML IJ SOLN
2.0000 mg | Freq: Four times a day (QID) | INTRAMUSCULAR | Status: DC | PRN
  Administered 2018-06-03: 2 mg via INTRAVENOUS
  Filled 2018-06-03: qty 1

## 2018-06-03 MED ORDER — FENTANYL 2500MCG IN NS 250ML (10MCG/ML) PREMIX INFUSION
0.0000 ug/h | INTRAVENOUS | Status: DC
  Administered 2018-06-03: 50 ug/h via INTRAVENOUS
  Filled 2018-06-03: qty 250

## 2018-06-03 MED ORDER — SODIUM PHOSPHATES 45 MMOLE/15ML IV SOLN
20.0000 mmol | Freq: Once | INTRAVENOUS | Status: DC
  Administered 2018-06-03: 20 mmol via INTRAVENOUS
  Filled 2018-06-03: qty 6.67

## 2018-06-03 MED ORDER — SODIUM CHLORIDE 0.9 % IV SOLN: 0.5000 mg/h | INTRAVENOUS | Status: DC

## 2018-06-03 NOTE — Consult Note (Signed)
Consultation Note Date: 06/03/2018   Patient Name: Gwendolyn Pham  DOB: 1955-12-03  MRN: [id number]  Age / Sex: 63 y.o., female  PCP: Rutherford Guys, MD Referring Physician: Rosetta Posner, MD  Reason for Consultation: Establishing goals of care, Non pain symptom management, Pain control, Psychosocial/spiritual support, Terminal Care and Withdrawal of life-sustaining treatment  HPI/Patient Profile: 63 y.o. female  with past medical history of severe peripheral vascular disease with claudication, hypertension, diabetes, hyperlipidemia, CVA admitted on 05/07/2018 plans for an elective procedure of aorto bifemoral bypass.  Patient has severe peripheral vascular disease with claudication and had met with her vascular physician about undergoing elective procedure secondary to the severity of her symptoms.  Unfortunately, patient has had complications after surgery including ischemic bowel with resection, ostomy and acute renal failure.  She has been on some form of dialysis now for 22 days.  When patient was tried on hemodialysis on 05/27/2018, she underwent a brief cardiac arrest.  She has been transitioned back to CRRT.  She remains pressor dependent, no urine output and unlikely to be able to tolerate any long-term hemodialysis.  Consult ordered for goals of care and potentially transitioning towards full comfort care  Clinical Assessment and Goals of Care: Patient seen, chart reviewed.  Met with patient's sister, Laretta Bolster.  Introduced palliative medicine services as an additional resource and source of support for patient and family during this difficult time.  Palliative care is care that is directed at patient goals and aimed at improving quality of life.  Patient's sister states that her sister would not want to continue to live on life any form of life support .  She is electing for comfort care at this point  She can no longer speak for herself.  Her healthcare proxy is her sister, Laretta Bolster at [phone]  Patient is unmarried with no children.  She has a younger sister, Freda Munro.  Per April, RN, patient has been seen a little boy in the room last night as well as she has been talking to her grandmother.  Sister verbalized that she believes her sister is ready to go and is asking to go home.  Explained means to liberate patient from pressors as well as CRRT    SUMMARY OF RECOMMENDATIONS   DNR/DNI DC CRRT and any form of dialysis See tube feedings Patient now comfort care Code Status/Advance Care Planning:  DNR    Symptom Management:   Dyspnea: Patient has not had any tachypnea, but with weak respiratory effort.  We will start with Dilaudid 0.5 mg to 5 mg every hour as needed for shortness of breath.  Monitor for need for continuous infusion  Pain: See above  Secretions: Start scheduled Robinul 0.4 mg every 6 hours  Agitation: Start Versed as needed 2 to 5 mg every 1 hour as needed.  Continue with scheduled Risperdal.  We will also add PRN Haldol  Palliative Prophylaxis:   Aspiration, Bowel Regimen, Delirium Protocol, Eye Care, Frequent Pain Assessment, Oral Care and Turn  Reposition  Additional Recommendations (Limitations, Scope, Preferences):  Full Comfort Care  Psycho-social/Spiritual:   Desire for further Chaplaincy support:yes  Additional Recommendations: Grief/Bereavement Support  Prognosis:   Hours - Days  Discharge Planning: Anticipated Hospital Death      Primary Diagnoses: Present on Admission: **None**   I have reviewed the medical record, interviewed the patient and family, and examined the patient. The following aspects are pertinent.  Past Medical History:  Diagnosis Date  . Anxiety   . Asthma   . COPD (chronic obstructive pulmonary disease) ([place])   . Depression   . Diabetes mellitus without complication ([place])   . Hyperlipidemia   .  Hypertension    Social History   Socioeconomic History  . Marital status: Single    Spouse name: Not on file  . Number of children: Not on file  . Years of education: Not on file  . Highest education level: Not on file  Occupational History  . Not on file  Social Needs  . Financial resource strain: Not on file  . Food insecurity:    Worry: Not on file    Inability: Not on file  . Transportation needs:    Medical: Not on file    Non-medical: Not on file  Tobacco Use  . Smoking status: Current Some Day Smoker    Packs/day: 0.50    Years: 50.00    Pack years: 25.00    Types: Cigarettes  . Smokeless tobacco: Never Used  Substance and Sexual Activity  . Alcohol use: No  . Drug use: No  . Sexual activity: Not Currently  Lifestyle  . Physical activity:    Days per week: Not on file    Minutes per session: Not on file  . Stress: Not on file  Relationships  . Social connections:    Talks on phone: Not on file    Gets together: Not on file    Attends religious service: Not on file    Active member of club or organization: Not on file    Attends meetings of clubs or organizations: Not on file    Relationship status: Not on file  Other Topics Concern  . Not on file  Social History Narrative   Marital status: single; not dating in 2018      Children: none      Lives: alone; 3 animals (cat, 2 dogs)      Employment:  Disability since 2004 due to depression/anxiety; followed by psychiatry.      Tobacco: 1 ppd x 30 years; quit once with patch      Alcohol: none      Drugs: none      Exercise: none in 2018      Seatbelt: 100%; no texting      ADLs: independent with ADLs; no assistant devices; drives      Advanced Directives:    Family History  Problem Relation Age of Onset  . Hypertension Mother   . Diabetes Mother   . COPD Father   . Heart disease Father 41       AMI x 3/CABG  . Diabetes Father   . Diabetes Sister   . Mental illness Sister        schizoaffective   . Diabetes Sister   . Arthritis Sister    Scheduled Meds: . glycopyrrolate  0.4 mg Intravenous Q6H  . mouth rinse  15 mL Mouth Rinse q12n4p  . risperiDONE  1 mg Per Tube BID  Continuous Infusions: PRN Meds:.acetaminophen, glycopyrrolate, haloperidol lactate, HYDROmorphone (DILAUDID) injection, labetalol, midazolam Medications Prior to Admission:  Prior to Admission medications   Medication Sig Start Date End Date Taking? Authorizing Provider  acetaminophen (TYLENOL) 325 MG tablet Take 650 mg by mouth every 6 (six) hours as needed for moderate pain.    Yes [provider]  aspirin EC 81 MG tablet Take 1 tablet (81 mg total) by mouth daily. 04/20/18  Yes Patwardhan, Manish J, MD  atorvastatin (LIPITOR) 80 MG tablet Take 1 tablet (80 mg total) by mouth daily. 04/14/18  Yes Rutherford Guys, MD  buPROPion [facility] SR) 150 MG 12 hr tablet Take 1 tablet (150 mg total) by mouth 2 (two) times daily. 01/23/18  Yes Rutherford Guys, MD  Fluticasone-Salmeterol (ADVAIR) 100-50 MCG/DOSE AEPB Inhale 1 puff into the lungs 2 (two) times daily.   Yes [provider]  hydrochlorothiazide (HYDRODIURIL) 25 MG tablet Take 1 tablet (25 mg total) by mouth daily. 01/23/18  Yes Rutherford Guys, MD  metFORMIN (GLUCOPHAGE) 500 MG tablet Take 1 tablet (500 mg total) by mouth daily with breakfast. 01/23/18  Yes Rutherford Guys, MD  metoprolol tartrate (LOPRESSOR) 25 MG tablet Take 1 tablet (25 mg total) by mouth 2 (two) times daily. 01/23/18  Yes Rutherford Guys, MD  venlafaxine XR (EFFEXOR-XR) 150 MG 24 hr capsule TAKE 1 CAPSULE BY MOUTH EVERY DAY WITH BREAKFAST Patient taking differently: Take 150 mg by mouth daily with breakfast.  02/10/18  Yes Rutherford Guys, MD   Allergies  Allergen Reactions  . Lisinopril Cough    Muscle pain   . Penicillins Rash    Did it involve swelling of the face/tongue/throat, SOB, or low BP? No Did it involve sudden or severe rash/hives, skin peeling, or any  reaction on the inside of your mouth or nose? No Did you need to seek medical attention at a hospital or doctor's office? No When did it last happen?Longer than 10 years ago If all above answers are "NO", may proceed with cephalosporin use.    Review of Systems  Unable to perform ROS: Acuity of condition    Physical Exam Vitals signs and nursing note reviewed.  Constitutional:      Comments: Acutely ill-appearing older female; confused, in restraints  HENT:     Head: Normocephalic and atraumatic.  Cardiovascular:     Rate and Rhythm: Normal rate.  Pulmonary:     Comments: No tachypnea Weak respiratory effort Beginnings of upper airway secretions Skin:    General: Skin is warm and dry.     Coloration: Skin is pale.  Neurological:     Comments: Unable to test orientation  Psychiatric:     Comments: She has been very agitated Currently on Precedex and in restraints     Vital Signs: BP (!) 79/60   Pulse 85   Temp (!) 95.7 F (35.4 C) (Axillary)   Resp (!) 21   Ht 5' 4"  (1.626 m)   Wt 55.5 kg   SpO2 100%   BMI 21.00 kg/m  Pain Scale: CPOT POSS *See Group Information*: 1-Acceptable,Awake and alert Pain Score: Asleep   SpO2: SpO2: 100 % O2 Device:SpO2: 100 % O2 Flow Rate: .O2 Flow Rate (L/min): 2 L/min  IO: Intake/output summary:   Intake/Output Summary (Last 24 hours) at 06/03/2018 1201 Last data filed at 06/03/2018 1000 Gross per 24 hour  Intake 3179.84 ml  Output 1840 ml  Net 1339.84 ml    LBM: Last  BM Date: 06/03/18 Baseline Weight: Weight: 61.7 kg Most recent weight: Weight: 55.5 kg     Palliative Assessment/Data:   Flowsheet Rows     Most Recent Value  Intake Tab  Referral Department  Critical care  Unit at Time of Referral  ICU  Palliative Care Primary Diagnosis  Cardiac  Date Notified  06/01/18  Palliative Care Type  New Palliative care  Reason for referral  Clarify Goals of Care, End of Life Care Assistance, Psychosocial or Spiritual  support, Non-pain Symptom, Pain  Date of Admission  05/01/2018  Date first seen by Palliative Care  06/03/18  # of days Palliative referral response time  2 Day(s)  # of days IP prior to Palliative referral  22  Clinical Assessment  Palliative Performance Scale Score  20%  Pain Max last 24 hours  Not able to report  Pain Min Last 24 hours  Not able to report  Dyspnea Max Last 24 Hours  Not able to report  Dyspnea Min Last 24 hours  Not able to report  Nausea Max Last 24 Hours  Not able to report  Nausea Min Last 24 Hours  Not able to report  Anxiety Max Last 24 Hours  Not able to report  Anxiety Min Last 24 Hours  Not able to report  Other Max Last 24 Hours  Not able to report  Psychosocial & Spiritual Assessment  Palliative Care Outcomes  Patient/Family meeting held?  Yes  Who was at the meeting?  sister  Patient/Family wishes: Interventions discontinued/not started   Mechanical Ventilation, BiPAP, Hemodialysis, Transfusion, Vasopressors, NIPPV, PEG, Antibiotics, Tube feedings/TPN  Palliative Care follow-up planned  Yes, Facility      Time In: 0900 Time Out: 1015 Time Total: 75 min Greater than 50%  of this time was spent counseling and coordinating care related to the above assessment and plan. Staffed with Dr. Elsworth Soho  Signed by: Dory Horn, NP   Please contact Palliative Medicine Team phone at [phone] for questions and concerns.  For individual provider: See Shea Evans

## 2018-06-03 NOTE — Progress Notes (Signed)
Subjective:  - still req pressors - no UOP recorded- CRRT restarted 3/19 - ran a liter positive but no real change  Objective Vital signs in last 24 hours: Vitals:   06/03/18 0700 06/03/18 0715 06/03/18 0723 06/03/18 0730  BP:  119/72  123/78  Pulse: 85 91  89  Resp: 19 17  16   Temp:   (!) 95.7 F (35.4 C)   TempSrc:   Axillary   SpO2: 100% 100%  100%  Weight:      Height:       Weight change: -7.5 kg  Intake/Output Summary (Last 24 hours) at 06/03/2018 0754 Last data filed at 06/03/2018 5643 Gross per 24 hour  Intake 3821.77 ml  Output 2729 ml  Net 1092.77 ml    Assessment/ Plan: Pt is a 63 y.o. yo female who was admitted on 04/20/2018 with aortobifem bypass with complication of ischemic bowel with resection and also AKI req HD in some form since 2/29  Assessment/Plan: 1. Renal- AKI as complication of aortobifem in Feb- has been HD dep in some form over the last 22 days- did not tolerate IHD, back on CRRT from 3/15 to 3/17.  Then held and resumed 3/19 via temp cath.  The more time that goes by makes it less likely that she could recover renal function  2. HTN/vol - thought possibly dry- lots of ostomy output- then off CRRT so essentially getting fluids , is positive fluid balance but still req pressors- run even with CRRT- giving volume prn   3. Anemia- hgb down-  added ESA- s/p transfusion 3/19 - need to use some heparin to keep machine from clotting   4. [place] - K 4.5, phos 1.8- mag 2.4-  Give some phos 5. Neuro- uremia resolved but still encephalopathic - EEG unremark-  MRI done, nothing obvious- neuro following  6. Dispo- agree with palliative care evaluation- scheduled for today - no significant change for the week that I have been on - cont CRRT based on conversations but I told nurse if it clots of do not restart for now    [employer]    Labs: Basic Metabolic Panel: Recent Labs  Lab 06/02/18 0417 06/02/18 1626 06/03/18 0413  NA 136 137 137  K 4.5 4.8  4.5  CL 102 102 103  CO2 25 24 25   GLUCOSE 157* 166* 146*  BUN 24* 15 12  CREATININE 1.34* 1.16* 1.03*  CALCIUM 8.6* 8.8* 8.7*  PHOS 2.5 3.1 1.8*   Liver Function Tests: Recent Labs  Lab 05/31/18 0355  06/02/18 0417 06/02/18 1626 06/03/18 0413  AST 21  --   --   --   --   ALT 23  --   --   --   --   ALKPHOS 95  --   --   --   --   BILITOT 0.6  --   --   --   --   PROT 5.6*  --   --   --   --   ALBUMIN 1.7*  1.6*   < > 2.1* 2.2* 2.0*   < > = values in this interval not displayed.   No results for input(s): LIPASE, AMYLASE in the last 168 hours. No results for input(s): AMMONIA in the last 168 hours. CBC: Recent Labs  Lab 05/30/18 0452 05/30/18 0900 05/31/18 0355 06/01/18 0448 06/02/18 0417  WBC 11.8* 12.9* 12.3* 14.3* 16.0*  HGB 7.5* 7.5* 7.2* 6.8* 10.0*  HCT 24.0* 24.4* 22.4*  22.5* 30.2*  MCV 95.2 96.4 96.1 98.3 91.0  PLT 256 245 255 251 229   Cardiac Enzymes: No results for input(s): CKTOTAL, CKMB, CKMBINDEX, TROPONINI in the last 168 hours. CBG: Recent Labs  Lab 06/02/18 1533 06/02/18 1930 06/02/18 2354 06/03/18 0419 06/03/18 0723  GLUCAP 153* 172* 134* 134* 134*    Iron Studies:  No results for input(s): IRON, TIBC, TRANSFERRIN, FERRITIN in the last 72 hours. Studies/Results: No results found. Medications: Infusions: .  prismasol BGK 4/2.5 300 mL/hr at 06/02/18 2014  .  prismasol BGK 4/2.5 400 mL/hr at 06/03/18 0044  . sodium chloride Stopped (05/30/18 1056)  . dexmedetomidine (PRECEDEX) IV infusion 1.2 mcg/kg/hr (06/03/18 0425)  . feeding supplement (VITAL AF 1.2 CAL) 1,000 mL (06/03/18 0549)  . heparin 10,000 units/ 20 mL infusion syringe 550 Units/hr (06/02/18 1849)  . norepinephrine (LEVOPHED) Adult infusion 10 mcg/min (06/03/18 0427)  . prismasol BGK 4/2.5 1,500 mL/hr at 06/03/18 0546    Scheduled Medications: . sodium chloride   Intravenous Once  . atorvastatin  80 mg Per Tube Daily  . chlorhexidine  15 mL Mouth Rinse BID  .  Chlorhexidine Gluconate Cloth  6 each Topical Daily  . darbepoetin (ARANESP) injection - NON-DIALYSIS  100 mcg Subcutaneous Q Tue-1800  . Gerhardt's butt cream   Topical BID  . insulin aspart  0-9 Units Subcutaneous Q4H  . loperamide HCl  4 mg Per Tube TID  . mouth rinse  15 mL Mouth Rinse q12n4p  . midodrine  5 mg Oral TID WC  . multivitamin  1 tablet Oral QHS  . octreotide  50 mcg Intravenous Q12H  . Opium  5 mg Per Tube Q6H  . pantoprazole sodium  40 mg Per Tube Daily  . risperiDONE  1 mg Per Tube BID  . sodium chloride flush  10-40 mL Intracatheter Q12H    have reviewed scheduled and prn medications.  Physical Exam: General: somnolent but talking- confused  Heart: tachy  Lungs: mostly clear Abdomen: soft Extremities: no edema- no stigmata of CES on toes- heel protectors  Dialysis Access: right IJ vascath placed 3/15    06/03/2018,7:54 AM  LOS: 24 days

## 2018-06-03 NOTE — Progress Notes (Signed)
NAME:  Gwendolyn Pham, MRN:  [id number], DOB:  1955-08-08, LOS: 24 ADMISSION DATE:  04/18/2018, CONSULTATION DATE:  05/27/2018 REFERRING MD:  Hyman Hopes - Nephrology, CHIEF COMPLAINT:  Acute renal failure.   HPI/course in hospital  63 year old woman with complicated course following aorto-bifemoral bypass performed 2/26 for severe claudication with aortic occlusion.  Complicated course thereafter by ischemic bowel with resection of distal small bowel and partial colectomy with eventual closure of the abdomen after a multistep surgery on 2/29. She has developed AKI and required HD. She has poor would healing. She had hypotension and did not tolerate IHD & developed confusion due to uremia.   3/14 she sustained a brief cardiac arrest on initiation of HD.   RIJ HD cath 3/15 >>   Significant tests/ events reviewed 3/17 CRRT stopped 3/18 creat doubled 3/18 MRI brain-chronic right MCA infarct 3/19 CRRT resumed    Interim history/subjective:   Remains critically ill, hypothermic on CRRT, Remains delirious on low-dose Precedex drip with intermittent agitation Anuric    Objective   Blood pressure (!) 115/99, pulse 87, temperature (!) 95.7 F (35.4 C), temperature source Axillary, resp. rate 18, height 5\' 4"  (1.626 m), weight 55.5 kg, SpO2 100 %.        Intake/Output Summary (Last 24 hours) at 06/03/2018 0165 Last data filed at 06/03/2018 0800 Gross per 24 hour  Intake 3905.45 ml  Output 2758 ml  Net 1147.45 ml   Filed Weights   06/01/18 0900 06/02/18 0500 06/03/18 0440  Weight: 63 kg 58.4 kg 55.5 kg    Examination: Chronically ill-appearing, older than stated age, remains in four-point restraints and Posey Pallor, no icterus No JVD, dialysis catheter no bleeding Decreased breath sounds bilateral S1-S2 regular Soft nontender abdomen, Increased stool in ostomy bag, more solid, minimal drainage from wound VAC Intermittently follows one-step commands, could not tell me her name,   moves all 4 extremities 1+ edema   Assessment & Plan:   Acute encephalopathy: Due to ICU delirium Head CT 3/13 old right frontal/MCA infarct ,?  Small left thalamus infarct versus artifact EEG 3/13?  Triphasic waves more prominent on the left versus artifact MRI-did not show any new infarcts  -Continue to Taper Precedex to off, goal RASS 0 - Fentanyl for pain -Continue Risperdal 1mg  twice daily  -Discontinued Ativan              Hypotension-probably related to Precedex, postarrest Await echo Continue low-dose Levophed, Capoten mics   AKI : HD dep in some form since 2/29/ /2020 - did not tolerate IHD, needed CRRT-  3/15 -3/17 Remains anuric  Plan -Back on CRRT -unfortunately she has not tolerated intermittent dialysis due to hypotension Not clear that she is a candidate for long-term dialysis Agree with stopping CRRT now the numbers have normalized   Ischemic  Bowel -status post right and left colon resection, small bowel resection 2/27 with left end colostomy 2/29 and ileocolonic anastomosis Stoma looks pink Plan: -Postop management of ostomy and wound VAC per wound care>> Surgery has signed off,  - Tolerating TF - Remains on  imodium and tincture of opium for high output  -Resume octreotide , will consider stopping again at some point next week  Diabetes type 2-SSI - CBG Q 4 - SSI -Continue tube feeds.  Leukocytosis Stable off antibiotics Plan Trend CBC  Anemia of critical illness Drop in HGB from 9.5- 6.8 _ 1 u PRBC   Plan On ESA Transfuse for HGB less than  7 No signs of obvious bleeding Hold Heparin>> SCD's ordered   Updated sister on a daily basis, DNR issued, goals of care ongoing Seems like she would require  long-term dialysis not tolerated intermittent dialysis so far. Palliative care discussion plan for today, she has persistent delirium, CRRT to stop after today and we will also discuss whether to restart or not In my opinion, we should not at  this point.  If she regains her blood pressure then can try intermittent dialysis again   The patient is critically ill with multiple organ systems failure and requires high complexity decision making for assessment and support, frequent evaluation and titration of therapies, application of advanced monitoring technologies and extensive interpretation of multiple databases. Critical Care Time devoted to patient care services described in this note independent of APP/resident  time is 32 minutes.      Cyril Mourning MD. Tonny Bollman. [facility] Pager [phone] If no response call [phone]    06/03/2018  8:23 AM

## 2018-06-03 NOTE — Progress Notes (Signed)
Palliative care meeting this am.  Will follow up later today.  No overnight events   Gwendolyn Pham

## 2018-06-14 NOTE — Progress Notes (Signed)
95 cc fentanyl 250 mcg/2500cc concentration wasted in sink, witnessed by nurse Britt Boozer

## 2018-06-14 NOTE — Discharge Summary (Signed)
Discharge Summary    Gwendolyn Pham [birth year] 63 y.o. female  [id number]  Admission Date: 04/19/2018  Discharge Date: 07-03-2018  Physician: Curt Jews, MD  Admission Diagnosis: Aortic occlusion [facility]) [I74.10]   HPI:   This is a 63 y.o. female here today for follow-up of her peripheral vascular disease and discussion of CT angiogram.  Here today with her sister.  I had seen her initially several weeks ago.  I felt at that time that in all likelihood from a clinical standpoint that she had an occluded aorta.  She underwent CT angiogram for further evaluation.  She has no tissue loss but has severe bilateral lower extremity claudication.  Hospital Course:  The patient was admitted to the hospital and taken to the operating room on 04/20/2018 and underwent: Aortobifemoral bypass with 14 x 8 Hemashield graft    The pt tolerated the procedure well and was transported to the PACU in good condition.   That afternoon, the pt was comfortable in PACU and had excellent uop.  Some hypertension but 165 systolic with dose of labetalol.  She had palpable DP pulses bilaterally.   POD 1, Easily palpable dorsalis pedis pulses bilaterally.  Pain control remains an issue.  Very anxious.  Will begin home meds.  Creatinine up to 2.5 but 350 cc urine output over the last 12 hours.  Continue to monitor.  IVs changed to normal saline.  Later that evening, on call MD was called for low urine output and patient was given 500 cc bolus with minimal response and initial labs were obtained that indicated creatinine is now increased to 3.07, K 5.2.  Paged again late this evening that patient had become hypotensive after sitting up to get on the bedpan.  Initially gave 1 L fluid bolus with minimal response.  Labs obtained shows hemoglobin from 11.1 this morning to 8.6 and suspect part of this is dilution in the setting of minimal urine output and at least 3 L of fluid in the last 24 hours.  She denies any worsening  abdominal pain from her baseline surgical pain along midline incision.  Both of her groins are bruised but they are very soft.  She has palpable pedal pulses.  Her bicarb is now 20 and we will switch her to a bicarb drip and now oliguria.  Her gas pH was 7.2 that is all metabolic acidosis.  My initial suspicion is that this acidosis is related to her renal failure.  EKG did not reveal any significant changes and initial troponins are normal.  I will now plan to give her 1 unit of blood and recheck an H&H to see how she responds.  I have also asked critical care to assist with management at this time.  I have updated her sister.  We will continue to follow closely.  Discussed if ongoing hypotension and no response to transfusion may require exploration in the OR.   Later, the pt remained hypotensive and requiring levophed after aggressive resuscitation.  She was re-intubated for tachypnea.  Her abdomen was distended and she was taken back to the OR for exploratory laparotomy.    Findings: During exploratory laparotomy we found that the distal one third of her small bowel, appendix, ascending colon, descending colon, and sigmoid colon were all frankly ischemic and nonviable.  There was no evidence of retroperitoneal hematoma around the graft or other evidence of active bleeding.  The graft had an excellent pulse.  There was a palpable pulse in the SMA  as well as a palpable pulse in the middle colic artery.  Ultimately general surgery was called to assist with small bowel resection as well as right colectomy and left colectomy.  Patient was left with an open abdomen and plans to return on Saturday for reexploration.    General surgery was consulted intraoperatively and she underwent small bowel resection, right and left colectomy.    By the next day, pt critically ill with multisystem organ failure.  Neuro evaluation noted regarding changes in pupils.  Maximal support continued.    On 05/12/2018, pt was taken  back to the operating room and underwent ileocolonic anastomosis, colostomy and closure of abdomen.    A nephrology consult was obtained.  CVVHD was started for hyperkalemia and metabolic derangements.   She tolerated this with improvement of her electrolyte abnormalities.  She did continue need for pressors.  She remained oligiuric, but was making some urine.   By 05/14/2018, she was not on pressors.  She remained intubated.  Waiting on ostomy function prior to TF's.  Holding heparin for thrombocytopenia and using SCD's.   On 05/15/2018, still off pressors.  Intubated.  No ostomy output.  On meropenem empirically.    On 05/17/2018, she remained hemodynamically stable.  More alert on vent.    On 05/18/2018, she did not have significant uop and remained on CRRT.  Electrolytes and volume status acceptable.  Plan to d/c CRRT today and watch for renal recovery.   Pt was extubated but remained confused.    On 05/20/2018, she remains pleasantly confused and no deficits.  Tolerating enteral tube feeds.  Colostomy productive and abdomen soft.    On 05/21/2018, pt tolerating enteral TF's, colostomy productive and abdomen soft.  Imodium started prior day for higher output of 1800+ from colostomy.   CRRT continues to be on hold.    On 05/22/2018, pt stable and awaiting floor bed.  Pt continues to have midline vac.  Nephrology planned HD treatment.    On 05/23/2018, pt still pleasantly confused.  She has palpable DP pulses bilaterally and tolerating TF's.  Creatinine rising and dialysis per renal service.   05/24/2018, pt continued to have palpable pedal pulses.  She remained confused.  Wound vac changed and wound appears healthy.   Dialysis planned as her renal function does not appear to be returning to normal.    On 05/25/2018, rapid response was called as pt was unresponsive.  On arrival, pt lying supine in bed, responding to noxious stimulation, skin cool, clammy and pale. Oriented to self and location only. SBP  68-74. She received one hour of dialysis before stopping treatment due to pt unresponsive. Dr. Donnetta Hutching made aware. SBP 90's after returning to 4E.  Pt was hypotensive with dialysis.  No fluid was removed during dialysis tx.    On 05/26/2018, pt's feet well perfused, groin incisions healed.  She failed swallow study and was continued on NG feeds.    Per general surgery, Some excoriation around ostomy  This will get better as output improves  Wound otherwise clean  I/O matched  Important to watch this since hypovolemia can trigger ischemic change in bowel   A neuro consult was obtained for AMS.   1. Overall clinical picture most consistent with combined effects of uremia and dialysis dysequilibrium syndrome.  2. Exam nonlateralizing. Findings are most consistent with a delirium. Tremor is also noted with mild asterixis, both of which are typical of a metabolic encephalopathy.  3. Elevated transaminases. A hepatic encephalopathy  may also be contributing to her clinical picture.  4. Ammonia is normal.   EEG results:  abnormal EEG secondary to posterior background slowing.  This finding may be seen with a diffuse gray matter disturbance that is etiologically nonspecific, but may include a dementia or encephalopathy, among other possibilities.  There is question of triphasic waves, most prominent on the left, versus artifact.  Clinical correlation recommended.   On 05/27/2018, pt to dialysis, When patient arrived on the HD unit he was was at reported base line alert to self only and confused,  vital signs T 97.5 P127 R 20 BP 152/79 O2 90%.  Patient appeared to be short of breath so she was put on non re-breather mask and a rapid response was called,  about a minute or two  later she was taking agonal breaths and was unresponsive and pulseless, chest compressions were initiated  and a code blue was called.  Patient was recovered to her base line, reports were called to Dr Justin Mend, Dr Early and the floor nurse  [place], patient was returned to her room in table condition accompanied by this RN and Code nurse.  Pt was seen by Dr. Donnetta Hutching after she returned to her room:  Patient is in 4 E. progressive unit.  Was taken to hemodialysis this morning.  Soon after arriving in hemodialysis became agonal in her breathing.  A code was called.  Apparently had a few chest compressions of less than a minute but never lost her pressure.  Had bagged breathing for several minutes.  Was then spontaneously breathing and was not intubated.  Stayed hemodynamically stable and was transferred back to 4 E. progressive unit.  She is still mildly confused but actually more alert than yesterday on my visit with her.  Only complains of some abdominal soreness.  Oxygen saturation is 99% on 6 L.  Talking in no respiratory distress.  Sinus rhythm.  Per renal, Patient was seen during dialysis 05/27/2018.  This was another complicated dialysis treatment now with respiratory distress.  She does not seem to be tolerating hemodialysis.  We will see if primary service wishes to transfer her back to stepdown for CRRT.  Her BUN is greater than 100 this may be contributing to her mental status change.  I would favor CRRT in this lady.  She was transferred to ICU for CRRT.    On 05/28/2018, pt BUN > 100, which may be contributing to her mental status change.  She has pulled out her dialysis catheter.  This was replaced.  She was hyperkalemic and CRRT was started when catheter was replaced.    Per neuro, EEG was consistent with a diffuse encephalopathy. No electrographic seizures were noted.   MRI has not yet been performed. If patient improves with further dialysis sessions, may be able to defer MRI to outpatient setting.   On 05/29/2018, while in the ICU, she was requiring pressor support and remains encephalopathic.    05/30/2018, she remained on Levophed.   CRRT to stop later in the day to see if kidneys have any recovery.   05/31/2018, pt  she did not have any uop and most likely will need CRRT or HD to restart.     06/01/2018, no change in status. Most likely kidneys with no recovery.  The more time that goes by makes it less likely that she could recover renal function.     06/02/2018, tachycardia.  Levophed discontinued and neo started.    Palliative consult  on 06/03/2018, Met with patient's sister, Laretta Bolster.  Introduced palliative medicine services as an additional resource and source of support for patient and family during this difficult time.  Palliative care is care that is directed at patient goals and aimed at improving quality of life.  Patient's sister states that her sister would not want to continue to live on life any form of life support .  She is electing for comfort care at this point  She can no longer speak for herself.  Her healthcare proxy is her sister, Laretta Bolster at [phone]  Patient is unmarried with no children.  She has a younger sister, Freda Munro.  Per April, RN, patient has been seen a little boy in the room last night as well as she has been talking to her grandmother.  Sister verbalized that she believes her sister is ready to go and is asking to go home.  On June 09, 2018, pt unresponsive, cyanotic with agonal breathing.  TOD 0024.    CBC    Component Value Date/Time   WBC 16.0 (H) 06/02/2018 0417   RBC 3.32 (L) 06/02/2018 0417   HGB 10.0 (L) 06/02/2018 0417   HGB 15.8 01/23/2018 1218   HCT 30.2 (L) 06/02/2018 0417   HCT 45.9 01/23/2018 1218   PLT 229 06/02/2018 0417   PLT 253 01/23/2018 1218   MCV 91.0 06/02/2018 0417   MCV 93 01/23/2018 1218   MCH 30.1 06/02/2018 0417   MCHC 33.1 06/02/2018 0417   RDW 19.0 (H) 06/02/2018 0417   RDW 13.7 01/23/2018 1218   LYMPHSABS 1.1 05/08/2018 1802   LYMPHSABS 2.8 01/23/2018 1218   MONOABS 0.3 04/15/2018 1802   EOSABS 0.0 05/05/2018 1802   EOSABS 0.1 01/23/2018 1218   BASOSABS 0.0 04/20/2018 1802   BASOSABS 0.1 01/23/2018 1218     BMET    Component Value Date/Time   NA 137 06/03/2018 0413   NA 137 01/23/2018 1218   K 4.5 06/03/2018 0413   CL 103 06/03/2018 0413   CO2 25 06/03/2018 0413   GLUCOSE 146 (H) 06/03/2018 0413   BUN 12 06/03/2018 0413   BUN 11 01/23/2018 1218   CREATININE 1.03 (H) 06/03/2018 0413   CREATININE 0.66 06/18/2015 1855   CALCIUM 8.7 (L) 06/03/2018 0413   GFRNONAA 58 (L) 06/03/2018 0413   GFRNONAA >89 01/31/2014 1532   GFRAA >60 06/03/2018 0413   GFRAA >89 01/31/2014 1532        Discharge Diagnosis:  Aortic occlusion (HCC) [I74.10]  Secondary Diagnosis: Patient Active Problem List   Diagnosis Date Noted   Palliative care by specialist    Terminal care    AKI (acute kidney injury) ([place])    Acute blood loss anemia    Benign essential HTN    Leukocytosis    Chronic obstructive pulmonary disease ([place])    Diabetes mellitus type 2 in nonobese ([place])    Pressure injury of skin 05/17/2018   Aortic occlusion ([place]) 05/03/2018   PAD (peripheral artery disease) ([place]) 04/20/2018   Moderate persistent asthma without complication 66/44/0347   Diabetes ([place]) 06/18/2015   HTN, goal below 140/90 02/22/2013   GERD (gastroesophageal reflux disease) 02/22/2013   Tobacco abuse 06/15/2012   Hypercholesteremia 04/16/2012   Anxiety 04/04/2012   Depression 04/04/2012   Past Medical History:  Diagnosis Date   Anxiety    Asthma    COPD (chronic obstructive pulmonary disease) ([place])    Depression    Diabetes mellitus without complication ([place])  Hyperlipidemia    Hypertension     Disposition: deceased   Leontine Locket, [facility] [phone] 06/08/2018  2:20 PM

## 2018-06-14 NOTE — Progress Notes (Signed)
HR with sudden decrease to 60. This rn arrived to room to find pt sister still at bedside, sister holding pts hand. Pt unresponsive, cyanotic dusky pale mottled, rr agonal, skin cool to touch. Emotional support given to pt daughter. Pt sister present at TOD 0024. Pt sister has received ongoing support from family pastor. This rn  will continue to provide emotional support to pt sister.

## 2018-06-14 DEATH — deceased

## 2018-07-23 ENCOUNTER — Other Ambulatory Visit: Payer: Self-pay | Admitting: Family Medicine

## 2018-08-09 LAB — ECHOCARDIOGRAM COMPLETE
Height: 64 in
Weight: 2222.24 oz

## 2020-07-20 IMAGING — DX DG ABD PORTABLE 1V
1 series · 1 of 1 positions shown · non-contrast
Comparison: Prior CT from 04/12/2018

CLINICAL DATA: Initial postoperative evaluation status post
aortobifemoral graft placement.

EXAM:
PORTABLE ABDOMEN - 1 VIEW

[abdomen kub]
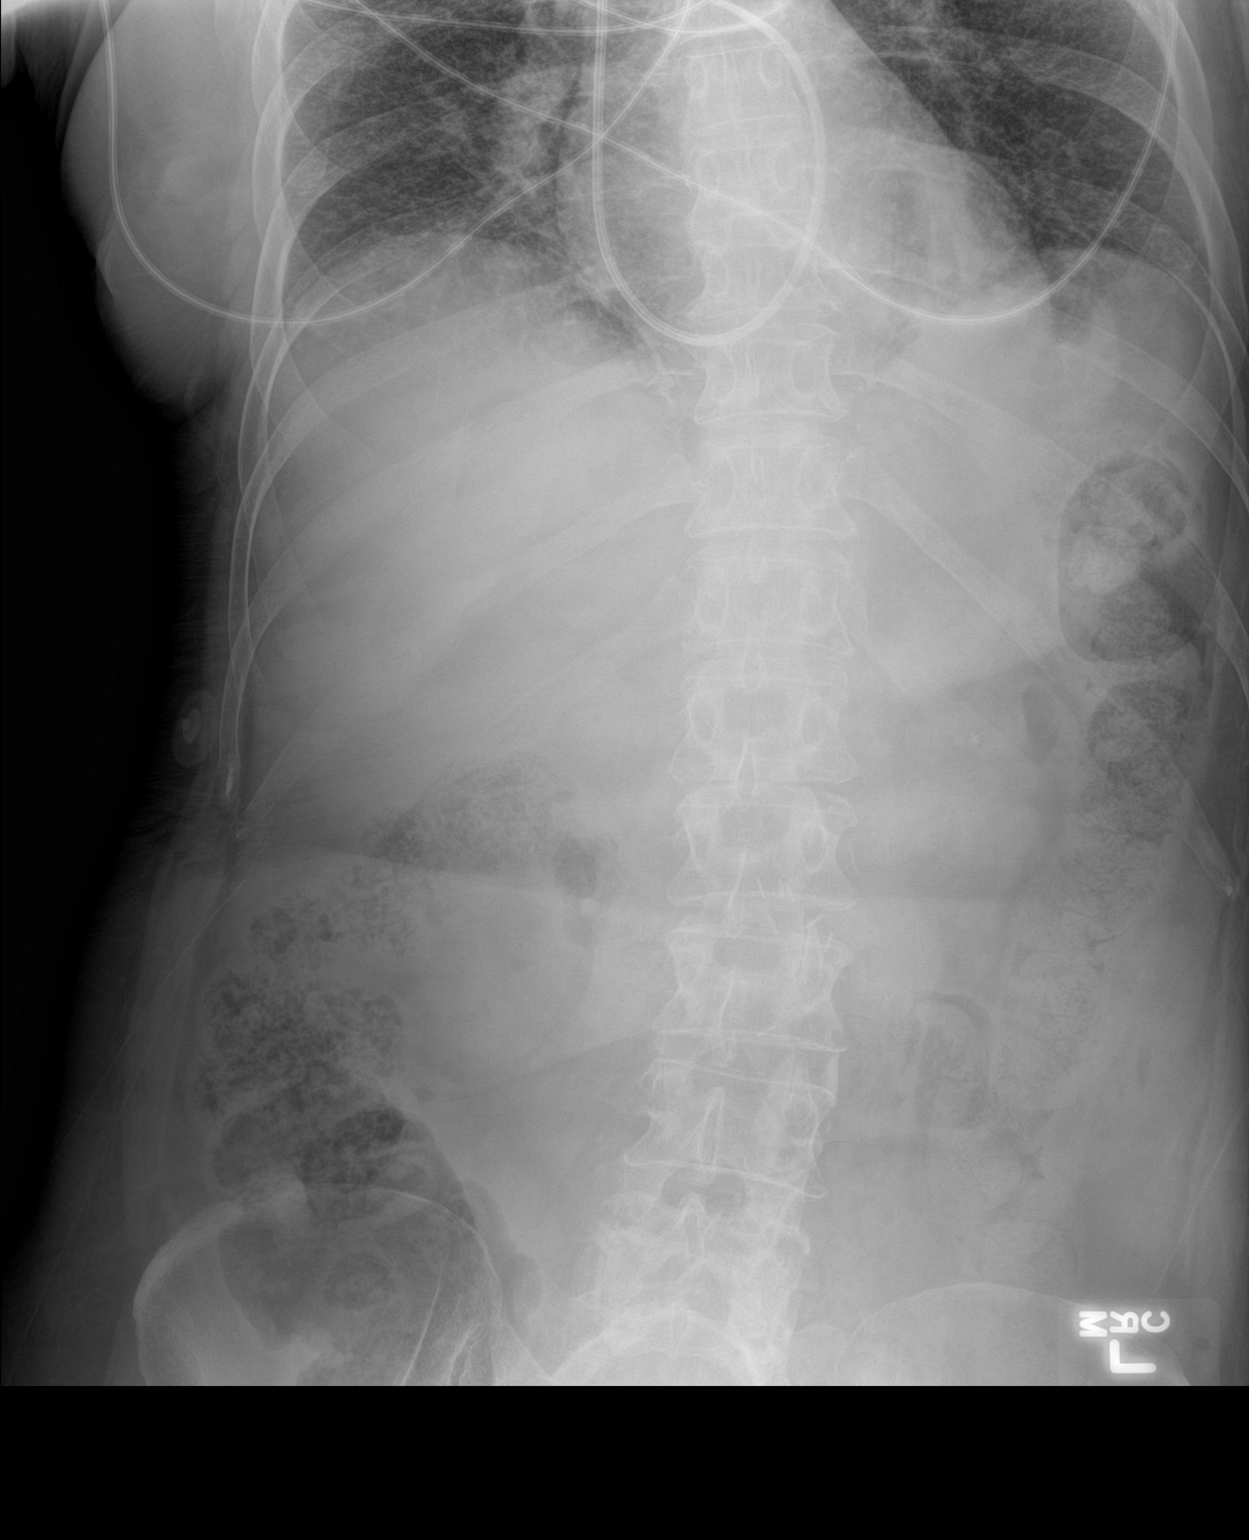

[1 of 1 positions shown; findings below may reference images not displayed]

FINDINGS: Surgical clips overlie the mid abdomen, consistent with interval
aortobifemoral graft. Graft itself not visualized.

Bowel gas pattern is nonobstructive. Large volume retained colonic
stool noted. No visible free air on this single supine view of the
abdomen. Punctate calcific densities overlying the left renal shadow
could reflect nephrolithiasis or possibly be vascular in nature.
Prominent atherosclerotic change noted within the iliac arteries
bilaterally.

Levoscoliosis with mild multilevel degenerative spondylolysis.
IMPRESSION: 1. Surgical clips overlying the mid abdomen, consistent with
provided history of interval aortobifemoral graft. No appreciable
adverse features.
2. Large volume retained colonic stool, suggesting constipation.
3. Calcific densities overlying the left renal shadow which could
reflect nephrolithiasis or possibly vascular calcifications.

## 2020-07-20 IMAGING — DX DG CHEST 1V PORT
1 series · 1 of 1 positions shown · non-contrast
Comparison: Prior CT from 04/12/2018

CLINICAL DATA: Initial postoperative evaluation status posts
aortobifemoral graft placement.

EXAM:
PORTABLE CHEST 1 VIEW

[chest ap]
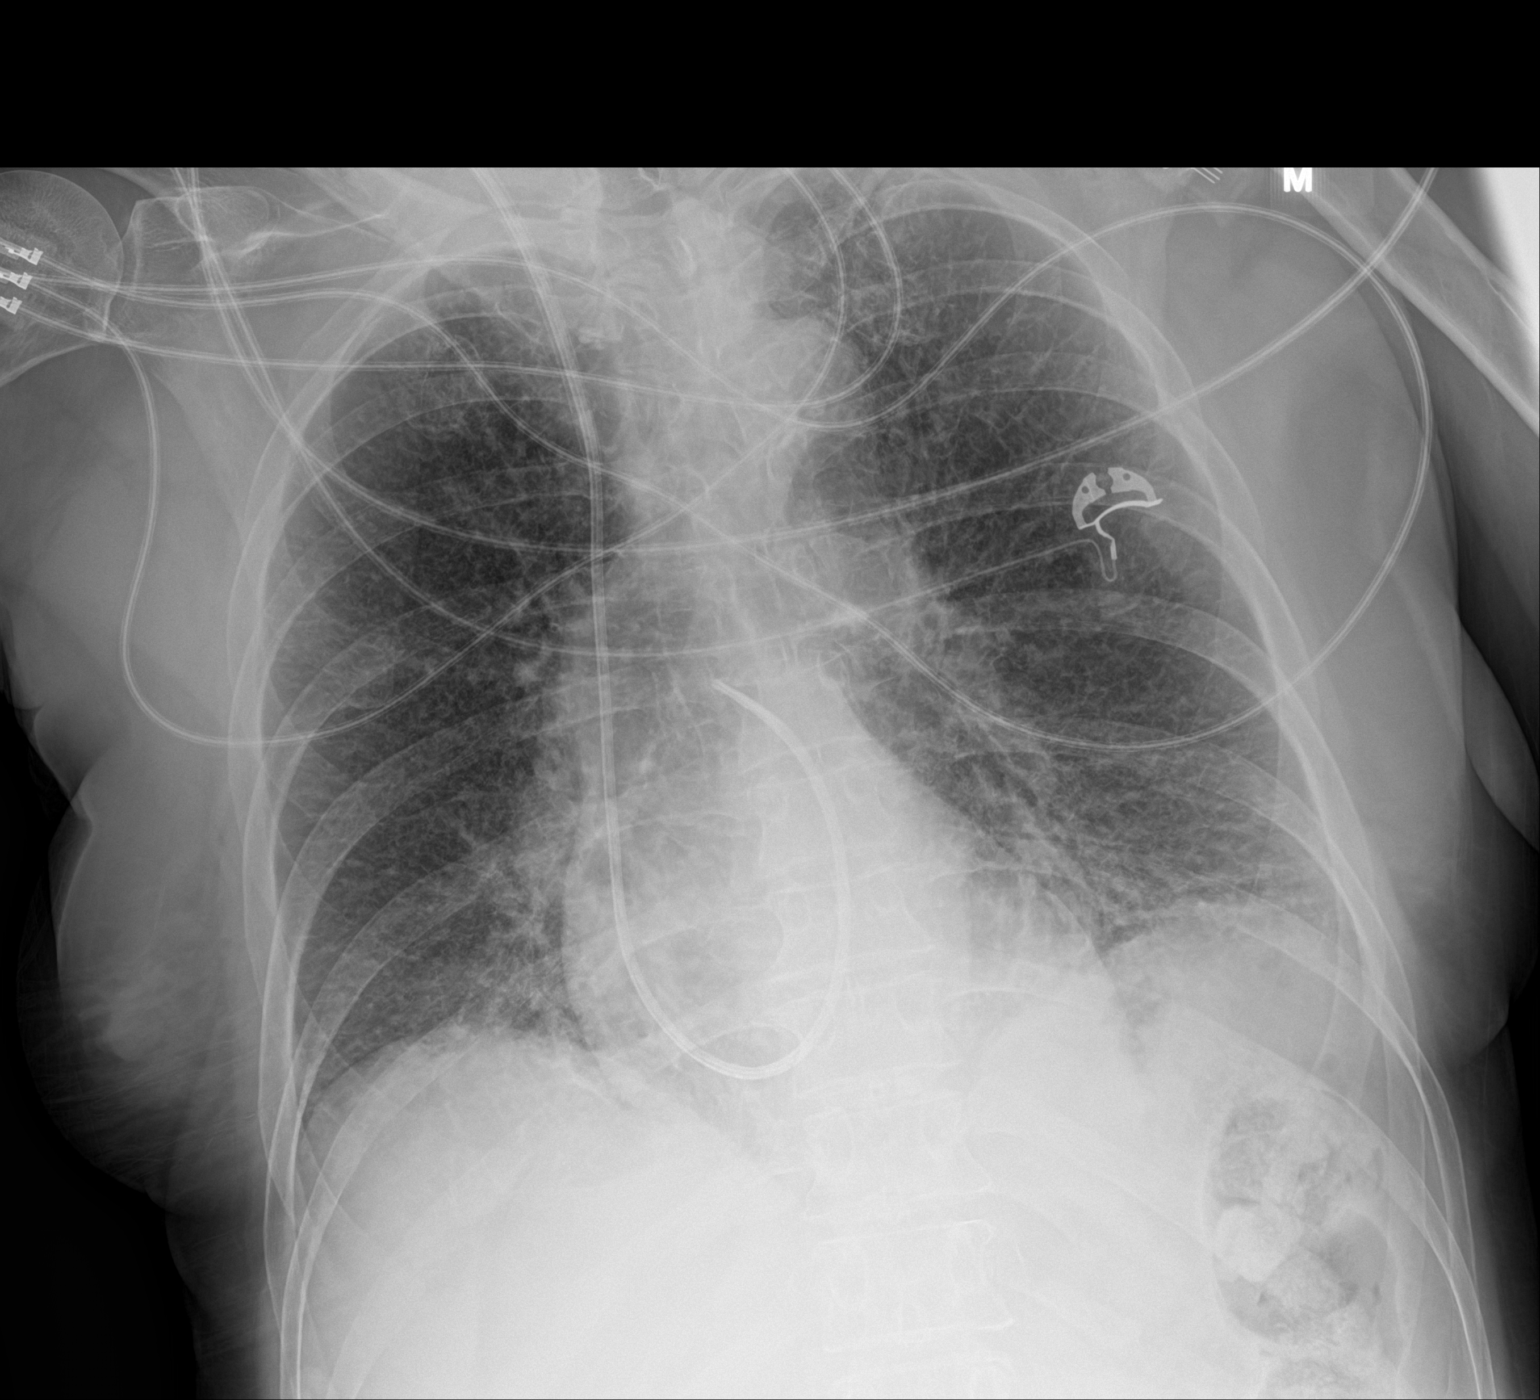

[1 of 1 positions shown; findings below may reference images not displayed]

FINDINGS: Transverse heart size within normal limits. Mediastinal silhouette
normal. Right IJ approach Swan-Ganz catheter in place with tip
overlying the main pulmonary outflow tract.

Lungs mildly hypoinflated. Mild to moderate diffuse pulmonary
interstitial edema. Superimposed mild bibasilar atelectasis. No
other consolidative opacity. Probable small left pleural effusion.
No pneumothorax.

No acute osseous finding.
IMPRESSION: 1. Right IJ approach Swan-Ganz catheter in place with tip overlying
the main pulmonary outflow tract.
2. Mild to moderate diffuse pulmonary interstitial edema with
probable small left pleural effusion.
3. Superimposed mild bibasilar atelectasis.

## 2020-07-21 IMAGING — DX DG CHEST 1V PORT
1 series · 1 of 1 positions shown · non-contrast
Comparison: 05/11/2018, 05/10/2018

CLINICAL DATA: 62-year-old female with history of shortness of
breath

EXAM:
PORTABLE CHEST 1 VIEW

[chest]
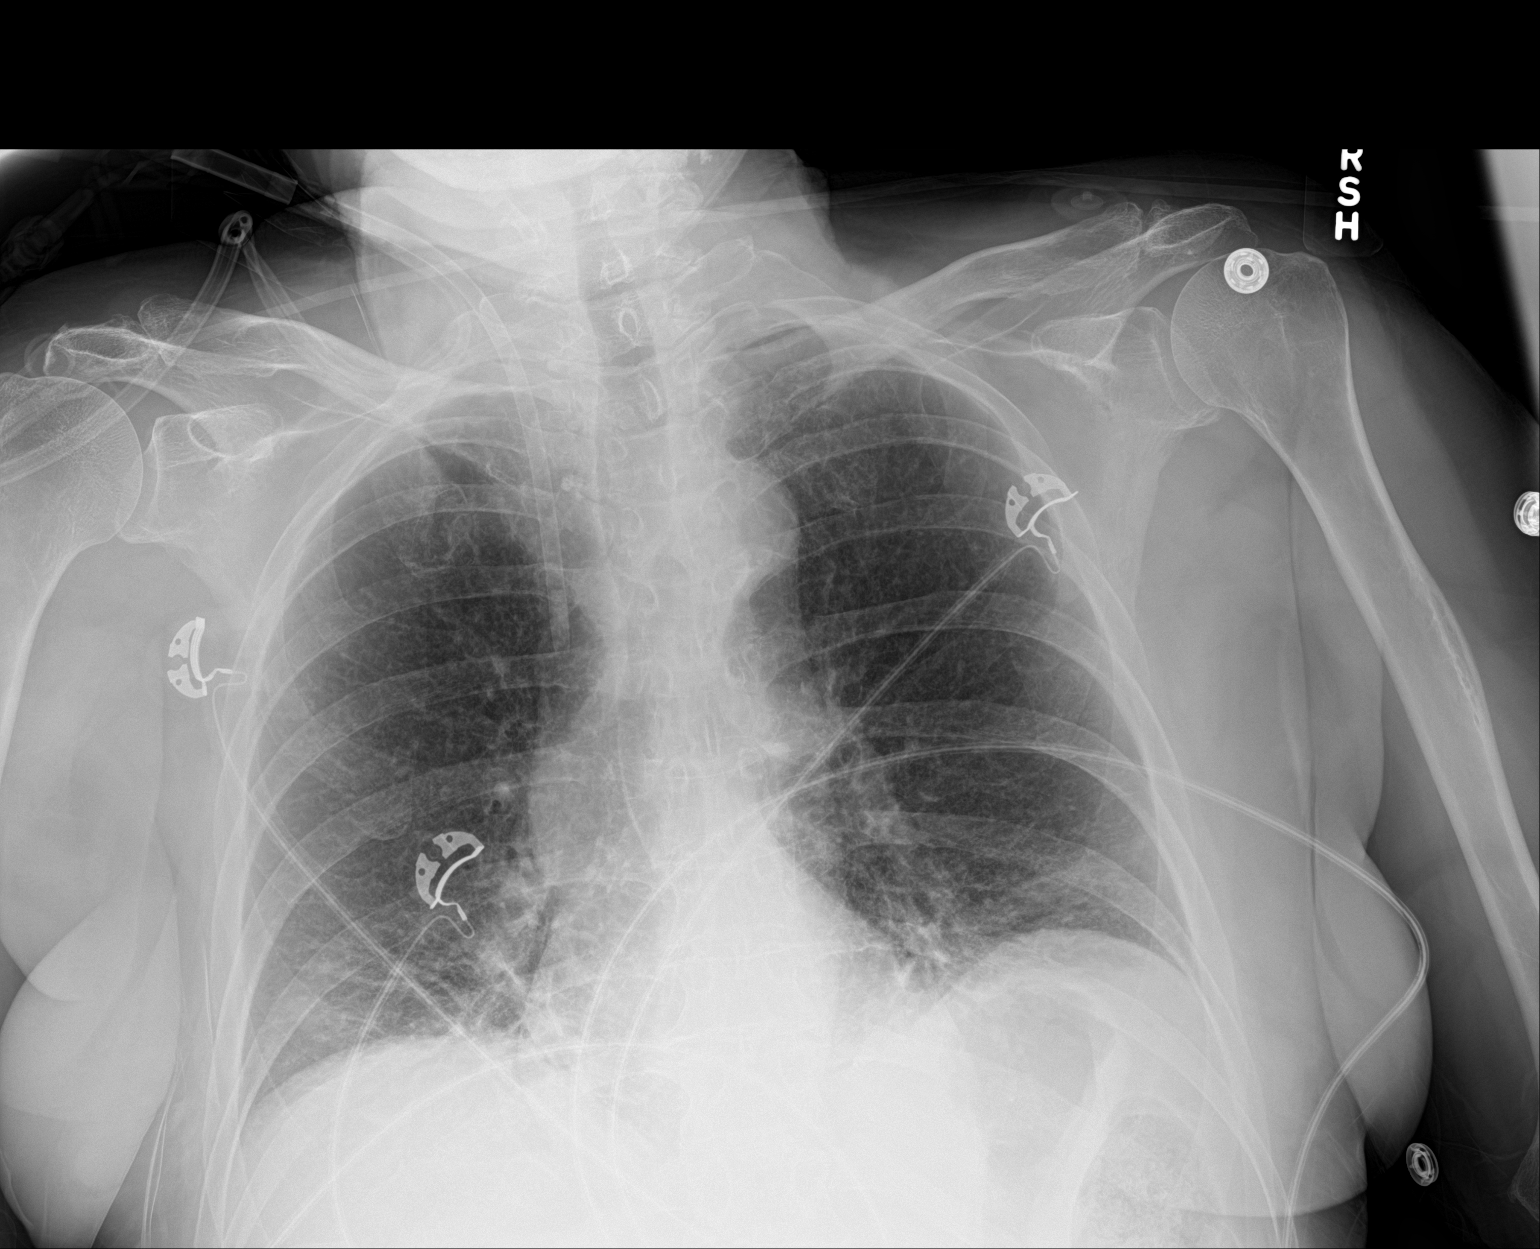

[1 of 1 positions shown; findings below may reference images not displayed]

FINDINGS: Cardiomediastinal silhouette unchanged in size and contour. Low lung
volumes. Minimal interlobular septal thickening. No pleural effusion
or pneumothorax.

Right IJ sheath remains with interval removal of the Swan-Ganz
catheter.

No confluent airspace disease.
IMPRESSION: Low lung volumes with either minimal edema or atelectasis.

Interval removal of the Swan-Ganz catheter with persisting right IJ
sheath.

## 2020-07-22 IMAGING — CT CT HEAD W/O CM
4 series · 16 of 47 positions shown, 18 images · non-contrast
Comparison: Head CT 04/03/2012.

CLINICAL DATA: 62-year-old female status post bilateral femoral
bypass graft with hypotension. Being evaluated for possible abnormal
pupils on exam.

EXAM:
CT HEAD WITHOUT CONTRAST
TECHNIQUE: Contiguous axial images were obtained from the base of the skull
through the vertex without intravenous contrast.

[Series 3: head without · axial · non-contrast · 0.39mm/px · z∈[-135,-25]mm · 7 of 30 slices shown, 9 images]
[im 4/30  brain]
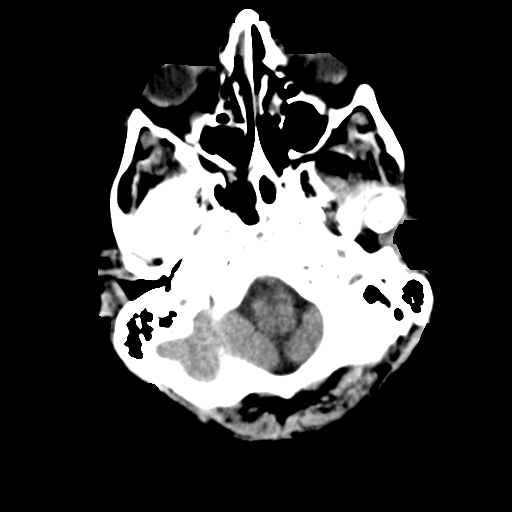
[im 4/30  bone]
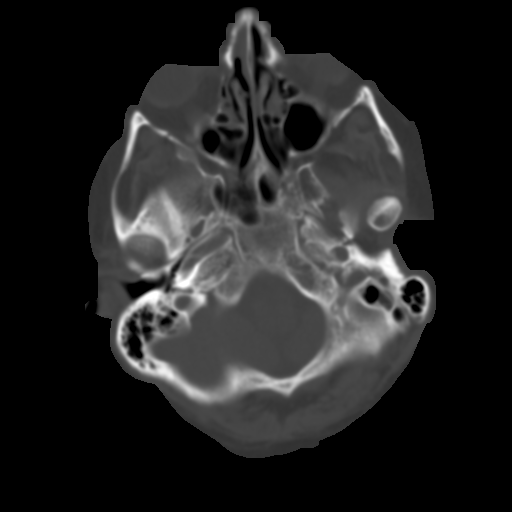
[im 8/30  brain]
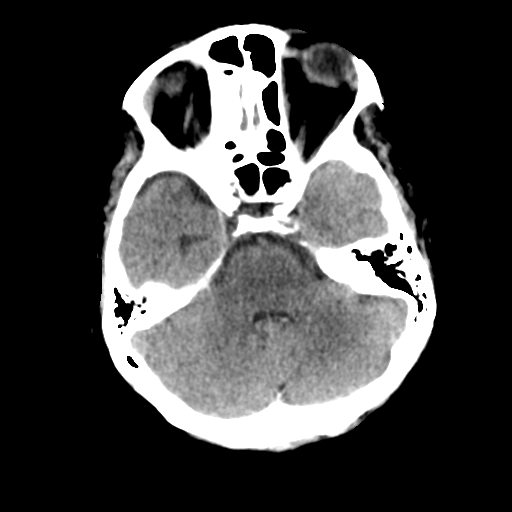
[im 11/30  brain]
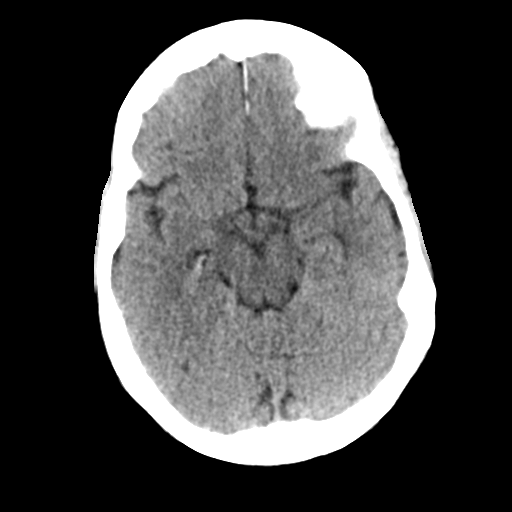
[im 15/30  brain]
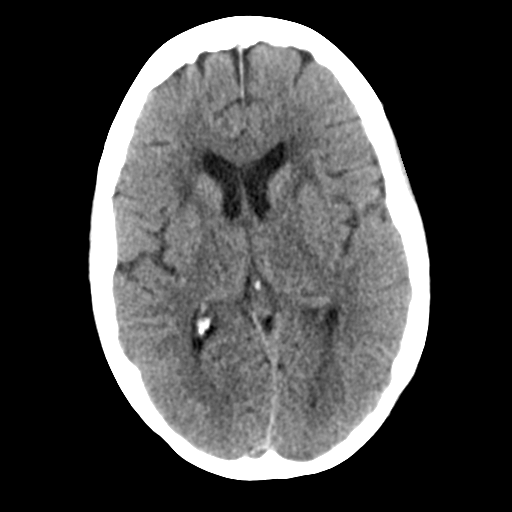
[im 19/30  brain]
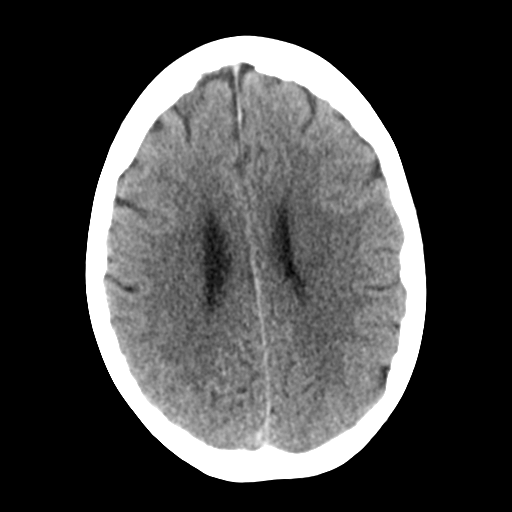
[im 19/30  bone]
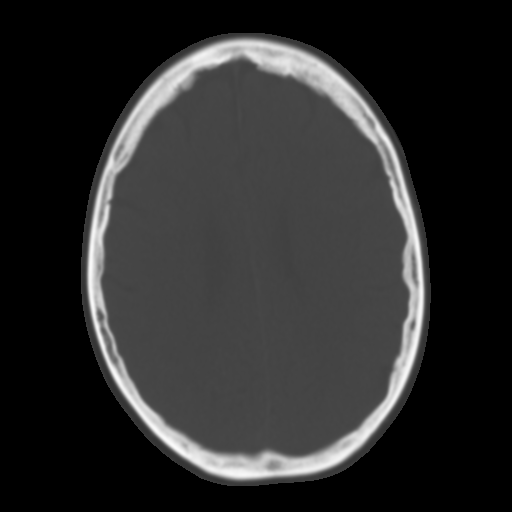
[im 22/30  brain]
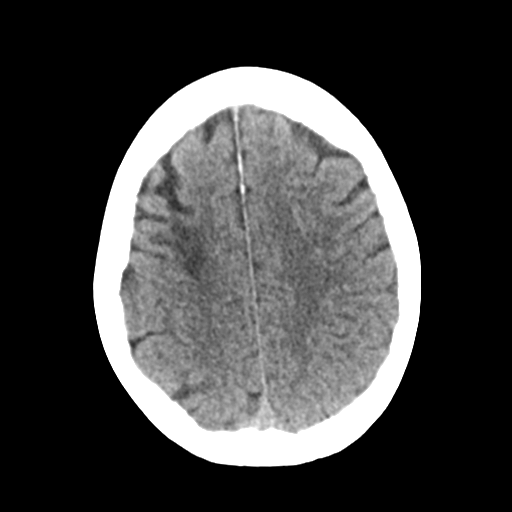
[im 26/30  brain]
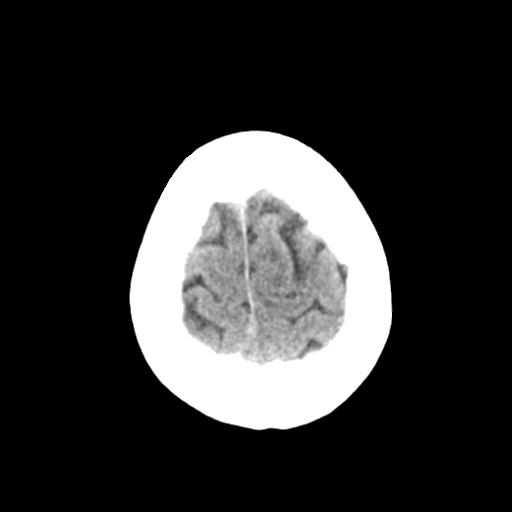

[Series 4: head bone · axial · 0.39mm/px · z∈[-136,-106]mm · 3 of 75 slices shown]
[im 8/75  bone]
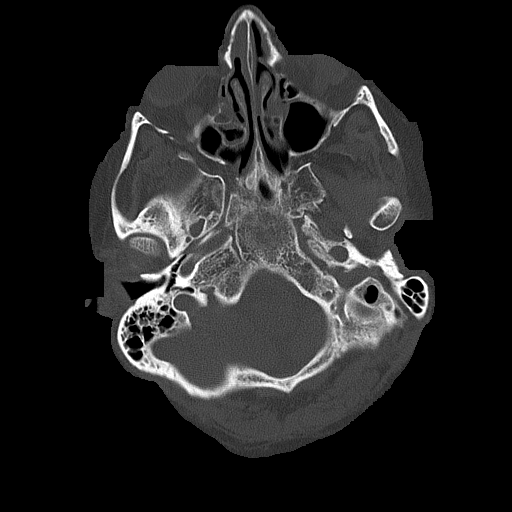
[im 15/75  bone]
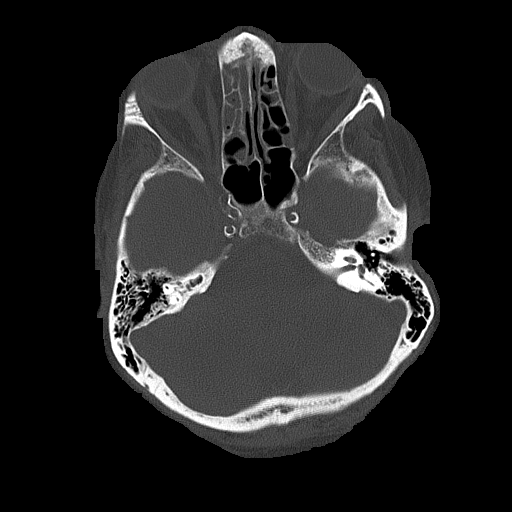
[im 23/75  bone]
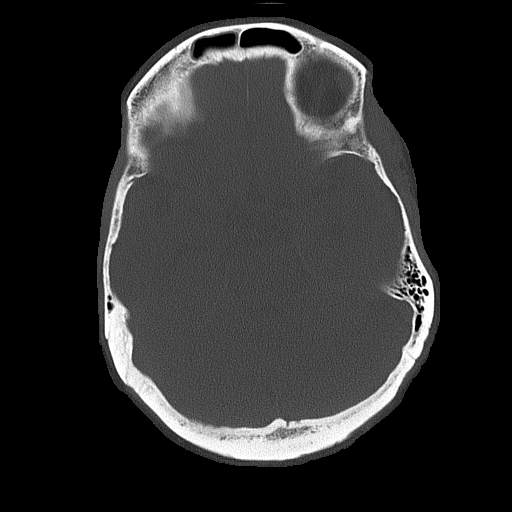

[Series 5: head without cor · coronal · non-contrast · 0.31mm/px · 3 of 64 slices shown]
[im 22/64  brain]
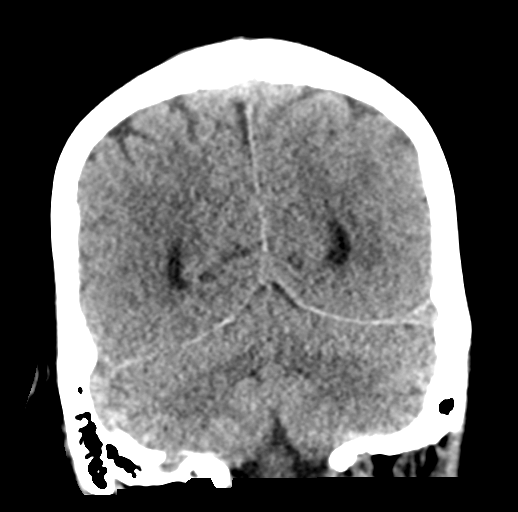
[im 29/64  brain]
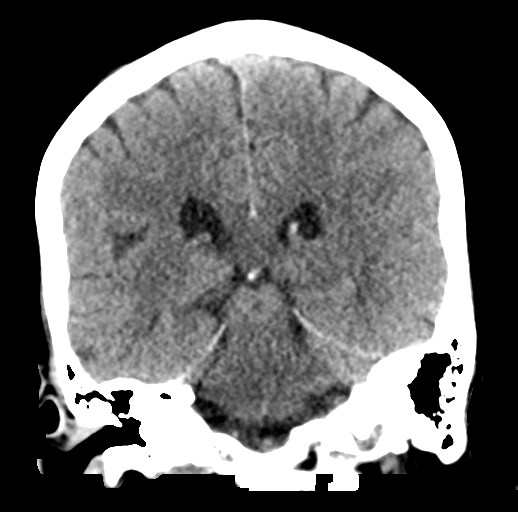
[im 36/64  brain]
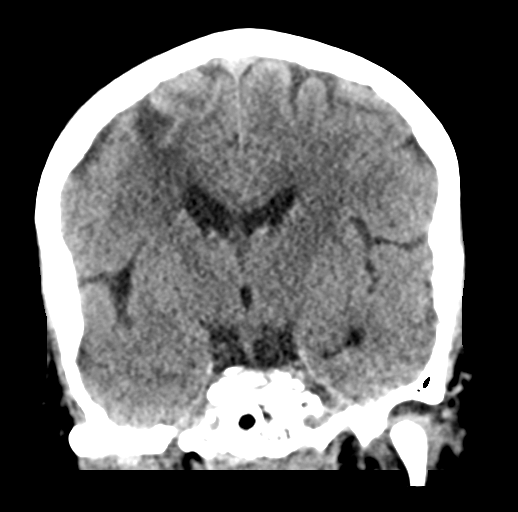

[Series 6: head without sag · sagittal · non-contrast · 0.30mm/px · 3 of 62 slices shown]
[im 21/62  brain]
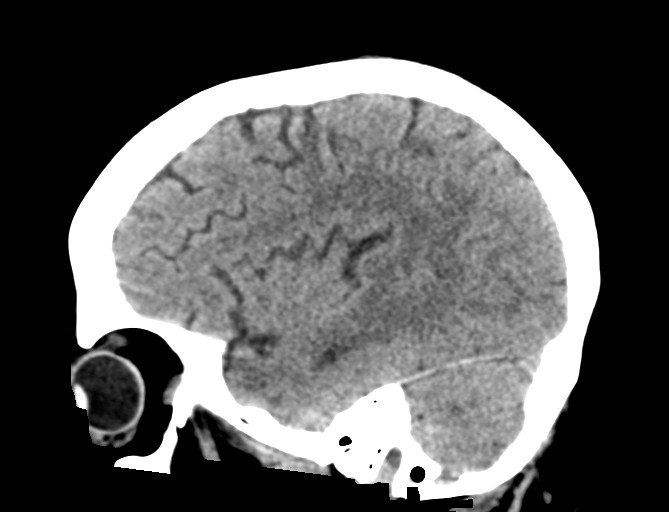
[im 31/62  brain]
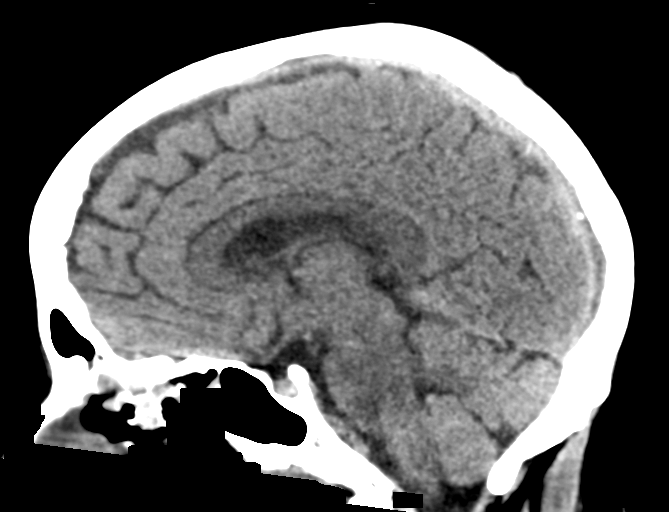
[im 41/62  brain]
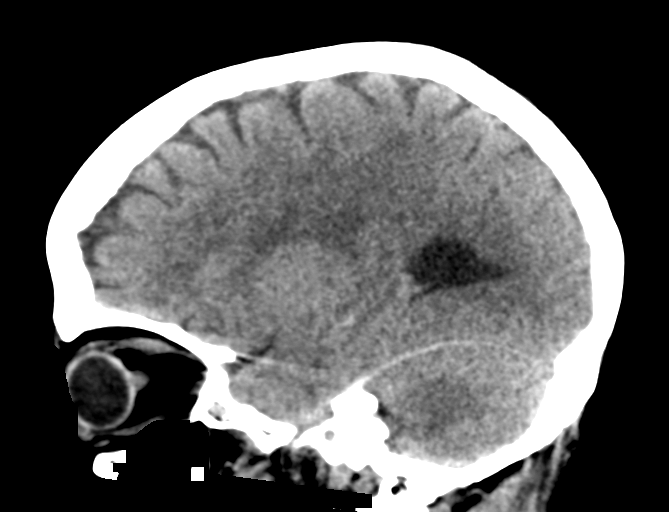

[16 of 47 positions shown; findings below may reference images not displayed]

FINDINGS: Brain: Chronic bilateral cerebral white matter hypodensity most
pronounced along the frontal horns, and involving the anterior deep
white matter capsules. There is a small area of cortical hypodensity
in the right middle frontal gyrus (series 3, image 22) some of which
seems to have been present in 2052 (series 2 image 22 at that time).
There is no associated mass effect.

No acute intracranial hemorrhage identified. No midline shift, mass
effect, or evidence of intracranial mass lesion. No
ventriculomegaly. No other cerebral cortical abnormality identified.

Vascular: Calcified atherosclerosis at the skull base. No suspicious
intracranial vascular hyperdensity.

Skull: No acute osseous abnormality identified.

Sinuses/Orbits: Intubated. Mild to moderate paranasal sinus mucosal
thickening with scattered bubbly opacity. The tympanic cavities and
mastoids remain well pneumatized.

Other: Negative orbit and scalp soft tissues.
IMPRESSION: 1. Evidence of chronically advanced cerebral white matter disease.
Probably chronic cortical encephalomalacia in the right middle
frontal gyrus, some of which was evident on a 2052 comparison.
No definite acute intracranial abnormality.
2. Study discussed by telephone with Neurology Dr. Myesha on
